# Patient Record
Sex: Female | Born: 1960 | Race: White | Hispanic: No | Smoking: Former smoker
Health system: Southern US, Community
[De-identification: ages and names within clinical notes are randomized; demographics above are authoritative.]

## PROBLEM LIST (undated history)

## (undated) DIAGNOSIS — K5790 Diverticulosis of intestine, part unspecified, without perforation or abscess without bleeding: Secondary | ICD-10-CM

## (undated) DIAGNOSIS — E119 Type 2 diabetes mellitus without complications: Secondary | ICD-10-CM

## (undated) DIAGNOSIS — E785 Hyperlipidemia, unspecified: Secondary | ICD-10-CM

## (undated) DIAGNOSIS — D649 Anemia, unspecified: Secondary | ICD-10-CM

## (undated) DIAGNOSIS — I2699 Other pulmonary embolism without acute cor pulmonale: Secondary | ICD-10-CM

## (undated) DIAGNOSIS — Z973 Presence of spectacles and contact lenses: Secondary | ICD-10-CM

## (undated) DIAGNOSIS — G894 Chronic pain syndrome: Secondary | ICD-10-CM

## (undated) DIAGNOSIS — C801 Malignant (primary) neoplasm, unspecified: Secondary | ICD-10-CM

## (undated) DIAGNOSIS — K219 Gastro-esophageal reflux disease without esophagitis: Secondary | ICD-10-CM

## (undated) DIAGNOSIS — F32A Depression, unspecified: Secondary | ICD-10-CM

## (undated) DIAGNOSIS — F329 Major depressive disorder, single episode, unspecified: Secondary | ICD-10-CM

## (undated) DIAGNOSIS — R87629 Unspecified abnormal cytological findings in specimens from vagina: Secondary | ICD-10-CM

## (undated) DIAGNOSIS — F419 Anxiety disorder, unspecified: Secondary | ICD-10-CM

## (undated) DIAGNOSIS — G5601 Carpal tunnel syndrome, right upper limb: Secondary | ICD-10-CM

## (undated) DIAGNOSIS — K432 Incisional hernia without obstruction or gangrene: Secondary | ICD-10-CM

## (undated) DIAGNOSIS — N189 Chronic kidney disease, unspecified: Secondary | ICD-10-CM

## (undated) DIAGNOSIS — M1990 Unspecified osteoarthritis, unspecified site: Secondary | ICD-10-CM

## (undated) DIAGNOSIS — Z8719 Personal history of other diseases of the digestive system: Secondary | ICD-10-CM

## (undated) DIAGNOSIS — J189 Pneumonia, unspecified organism: Secondary | ICD-10-CM

## (undated) DIAGNOSIS — Z9289 Personal history of other medical treatment: Secondary | ICD-10-CM

## (undated) DIAGNOSIS — C189 Malignant neoplasm of colon, unspecified: Secondary | ICD-10-CM

## (undated) HISTORY — PX: CHOLECYSTECTOMY: SHX55

## (undated) HISTORY — PX: REPLACEMENT TOTAL KNEE: SUR1224

## (undated) HISTORY — PX: IVC FILTER PLACEMENT (ARMC HX): HXRAD1551

## (undated) HISTORY — DX: Unspecified abnormal cytological findings in specimens from vagina: R87.629

## (undated) HISTORY — PX: OTHER SURGICAL HISTORY: SHX169

## (undated) HISTORY — PX: ABDOMINAL HYSTERECTOMY: SHX81

## (undated) HISTORY — PX: DILATION AND CURETTAGE OF UTERUS: SHX78

## (undated) HISTORY — PX: KIDNEY SURGERY: SHX687

## (undated) HISTORY — PX: APPENDECTOMY: SHX54

## (undated) HISTORY — DX: Malignant neoplasm of colon, unspecified: C18.9

## (undated) HISTORY — PX: TUBAL LIGATION: SHX77

---

## 2009-10-05 ENCOUNTER — Emergency Department (HOSPITAL_COMMUNITY): Admission: EM | Admit: 2009-10-05 | Discharge: 2009-10-05 | Payer: Self-pay | Admitting: Emergency Medicine

## 2010-06-12 LAB — DIFFERENTIAL
Basophils Relative: 1 % (ref 0–1)
Eosinophils Absolute: 0.1 10*3/uL (ref 0.0–0.7)
Lymphs Abs: 2.5 10*3/uL (ref 0.7–4.0)
Monocytes Absolute: 0.6 10*3/uL (ref 0.1–1.0)
Monocytes Relative: 7 % (ref 3–12)
Neutrophils Relative %: 60 % (ref 43–77)

## 2010-06-12 LAB — COMPREHENSIVE METABOLIC PANEL
ALT: 15 U/L (ref 0–35)
AST: 19 U/L (ref 0–37)
Albumin: 3.6 g/dL (ref 3.5–5.2)
Alkaline Phosphatase: 61 U/L (ref 39–117)
Calcium: 9.4 mg/dL (ref 8.4–10.5)
GFR calc Af Amer: >60 mL/min (ref >60)
Potassium: 3.8 mEq/L (ref 3.5–5.1)
Sodium: 138 mEq/L (ref 135–145)
Total Protein: 6.8 g/dL (ref 6.0–8.3)

## 2010-06-12 LAB — URINALYSIS, ROUTINE W REFLEX MICROSCOPIC
Glucose, UA: NEGATIVE mg/dL
Nitrite: NEGATIVE
Protein, ur: NEGATIVE mg/dL

## 2010-06-12 LAB — CBC
HCT: 38.6 % (ref 36.0–46.0)
MCHC: 33.9 g/dL (ref 30.0–36.0)
Platelets: 228 10*3/uL (ref 150–400)
RDW: 13.5 % (ref 11.5–15.5)
WBC: 8.2 10*3/uL (ref 4.0–10.5)

## 2010-06-12 LAB — PREGNANCY, URINE: Preg Test, Ur: NEGATIVE

## 2013-06-25 HISTORY — PX: JOINT REPLACEMENT: SHX530

## 2014-10-09 ENCOUNTER — Inpatient Hospital Stay (HOSPITAL_COMMUNITY)
Admission: EM | Admit: 2014-10-09 | Discharge: 2014-10-11 | DRG: 812 | Disposition: A | Discharge status: Home / Self Care | Payer: 59 | Attending: Internal Medicine | Admitting: Internal Medicine

## 2014-10-09 ENCOUNTER — Encounter (HOSPITAL_COMMUNITY): Payer: Self-pay | Admitting: *Deleted

## 2014-10-09 DIAGNOSIS — E119 Type 2 diabetes mellitus without complications: Secondary | ICD-10-CM | POA: Diagnosis present

## 2014-10-09 DIAGNOSIS — E669 Obesity, unspecified: Secondary | ICD-10-CM | POA: Diagnosis present

## 2014-10-09 DIAGNOSIS — D649 Anemia, unspecified: Secondary | ICD-10-CM | POA: Diagnosis not present

## 2014-10-09 DIAGNOSIS — Z87891 Personal history of nicotine dependence: Secondary | ICD-10-CM

## 2014-10-09 DIAGNOSIS — I10 Essential (primary) hypertension: Secondary | ICD-10-CM | POA: Diagnosis present

## 2014-10-09 DIAGNOSIS — R531 Weakness: Secondary | ICD-10-CM | POA: Diagnosis present

## 2014-10-09 DIAGNOSIS — E785 Hyperlipidemia, unspecified: Secondary | ICD-10-CM | POA: Diagnosis present

## 2014-10-09 DIAGNOSIS — Z791 Long term (current) use of non-steroidal anti-inflammatories (NSAID): Secondary | ICD-10-CM | POA: Diagnosis not present

## 2014-10-09 DIAGNOSIS — K219 Gastro-esophageal reflux disease without esophagitis: Secondary | ICD-10-CM | POA: Diagnosis present

## 2014-10-09 DIAGNOSIS — D509 Iron deficiency anemia, unspecified: Secondary | ICD-10-CM | POA: Diagnosis present

## 2014-10-09 HISTORY — DX: Hyperlipidemia, unspecified: E78.5

## 2014-10-09 HISTORY — DX: Unspecified osteoarthritis, unspecified site: M19.90

## 2014-10-09 HISTORY — DX: Type 2 diabetes mellitus without complications: E11.9

## 2014-10-09 LAB — BASIC METABOLIC PANEL
ANION GAP: 12 (ref 5–15)
BUN: 14 mg/dL (ref 6–20)
CHLORIDE: 97 mmol/L — AB (ref 101–111)
CO2: 25 mmol/L (ref 22–32)
Calcium: 9.5 mg/dL (ref 8.9–10.3)
Creatinine, Ser: 1.16 mg/dL — ABNORMAL HIGH (ref 0.44–1.00)
GFR calc Af Amer: >60 mL/min (ref >60)
GFR, EST NON AFRICAN AMERICAN: 52 mL/min — AB (ref >60)
GLUCOSE: 271 mg/dL — AB (ref 65–99)
POTASSIUM: 3.8 mmol/L (ref 3.5–5.1)
Sodium: 134 mmol/L — ABNORMAL LOW (ref 135–145)

## 2014-10-09 LAB — CBG MONITORING, ED: Glucose-Capillary: 209 mg/dL — ABNORMAL HIGH (ref 65–99)

## 2014-10-09 LAB — RETICULOCYTES
RBC.: 3.6 MIL/uL — ABNORMAL LOW (ref 3.87–5.11)
Retic Count, Absolute: 75.6 10*3/uL (ref 19.0–186.0)
Retic Ct Pct: 2.1 % (ref 0.4–3.1)

## 2014-10-09 LAB — CBC WITH DIFFERENTIAL/PLATELET
BASOS ABS: 0 10*3/uL (ref 0.0–0.1)
Basophils Relative: 1 % (ref 0–1)
EOS PCT: 4 % (ref 0–5)
Eosinophils Absolute: 0.3 10*3/uL (ref 0.0–0.7)
HCT: 23.3 % — ABNORMAL LOW (ref 36.0–46.0)
HEMOGLOBIN: 6.7 g/dL — AB (ref 12.0–15.0)
LYMPHS PCT: 22 % (ref 12–46)
Lymphs Abs: 1.6 10*3/uL (ref 0.7–4.0)
MCH: 18.7 pg — AB (ref 26.0–34.0)
MCHC: 28.8 g/dL — AB (ref 30.0–36.0)
MCV: 64.9 fL — AB (ref 78.0–100.0)
MONO ABS: 0.5 10*3/uL (ref 0.1–1.0)
Monocytes Relative: 6 % (ref 3–12)
NEUTROS ABS: 4.8 10*3/uL (ref 1.7–7.7)
NEUTROS PCT: 67 % (ref 43–77)
Platelets: 548 10*3/uL — ABNORMAL HIGH (ref 150–400)
RBC: 3.59 MIL/uL — AB (ref 3.87–5.11)
RDW: 18.2 % — ABNORMAL HIGH (ref 11.5–15.5)
WBC: 7.2 10*3/uL (ref 4.0–10.5)

## 2014-10-09 LAB — ABO/RH: ABO/RH(D): AB POS

## 2014-10-09 LAB — PREPARE RBC (CROSSMATCH)

## 2014-10-09 MED ORDER — SODIUM CHLORIDE 0.9 % IV BOLUS (SEPSIS)
500.0000 mL | Freq: Once | INTRAVENOUS | Status: AC
  Administered 2014-10-10: 500 mL via INTRAVENOUS

## 2014-10-09 MED ORDER — INSULIN ASPART 100 UNIT/ML ~~LOC~~ SOLN
0.0000 [IU] | Freq: Three times a day (TID) | SUBCUTANEOUS | Status: DC
  Administered 2014-10-10 – 2014-10-11 (×5): 4 [IU] via SUBCUTANEOUS

## 2014-10-09 MED ORDER — INSULIN ASPART 100 UNIT/ML ~~LOC~~ SOLN
0.0000 [IU] | Freq: Every day | SUBCUTANEOUS | Status: DC
  Administered 2014-10-10: 2 [IU] via SUBCUTANEOUS

## 2014-10-09 MED ORDER — ONDANSETRON HCL 4 MG PO TABS: 4.0000 mg | ORAL_TABLET | Freq: Four times a day (QID) | ORAL | Status: DC | PRN

## 2014-10-09 MED ORDER — ONDANSETRON HCL 4 MG/2ML IJ SOLN: 4.0000 mg | Freq: Four times a day (QID) | INTRAMUSCULAR | Status: DC | PRN

## 2014-10-09 MED ORDER — SODIUM CHLORIDE 0.9 % IV SOLN
Freq: Once | INTRAVENOUS | Status: AC
  Administered 2014-10-10: 01:00:00 via INTRAVENOUS

## 2014-10-09 NOTE — ED Notes (Signed)
Dr Gearlean Alf ordered to hold Blood administration until results of his pending lab orders.

## 2014-10-09 NOTE — ED Notes (Signed)
CRITICAL VALUE ALERT  Critical value received:  hgb 6.7  Date of notification:  10/09/14  Time of notification:  2105  Critical value read back:Yes.    Nurse who received alert:  [facility]  MD notified (1st page):Gosrani  Time of first page:  2107  MD notified (2nd page):  Time of second page:  Responding MD:  Anastasio Champion  Time MD responded:  2107

## 2014-10-09 NOTE — H&P (Signed)
[facility] History and Physical  [name] NAT:[id number] DOB: [birth year] DOA: 10/09/2014  Referring physician: ER, Dr. Lacinda Axon PCP: Josem Kaufmann, MD   Chief Complaint: Dyspnea. Fatigue.  HPI: Krystal Campbell is a 54 y.o. female  This is a 54 year old lady who gives a two-week history of fatigue, weakness, lightheadedness and dyspnea on exertion for 2 weeks. She denies any chest pain, fever, cough. She has a history of diabetes, hypertension and hyperlipidemia. She denies any rectal bleeding or melenic. There is no hematemesis. She does describe epigastric discomfort and reflux for the last 2 months. Rectal examination that was previously performed today was negative for blood. Evaluation in the emergency room shows it to have significant microcytic anemia with a hemoglobin of 6.7 and MCV of 64.9. She is now being admitted for further investigation and management.   Review of Systems:  Apart from symptoms above, all systems negative.  Past Medical History  Diagnosis Date  . Arthritis   . Diabetes mellitus without complication   . Hypertension   . Hyperlipidemia    Past Surgical History  Procedure Laterality Date  . Replacement total knee    . Abdominal hysterectomy    . Kidney surgery     Social History:  reports that she has quit smoking. She does not have any smokeless tobacco history on file. She reports that she drinks alcohol. Her drug history is not on file.  No Known Allergies   Family history: No family history of diabetes or hypertension.   Prior to Admission medications   Not on File   Physical Exam: Filed Vitals:   10/09/14 1836 10/09/14 2009 10/09/14 2030  BP: 135/78 115/72 115/56  Pulse: 122 68 108  Temp: 98.3 F (36.8 C)    TempSrc: Oral    Resp: 24 18   Height: 5\' 4"  (1.626 m)    Weight: 112.492 kg (248 lb)    SpO2: 100% 99% 99%    Wt Readings from Last 3 Encounters:  10/09/14 112.492 kg (248 lb)    General:  Appears calm and comfortable.  Surprisingly does not appear pale. Eyes: PERRL, normal lids, irises & conjunctiva ENT: grossly normal hearing, lips & tongue Neck: no LAD, masses or thyromegaly Cardiovascular: RRR, no m/r/g. No LE edema. Telemetry: SR, no arrhythmias  Respiratory: CTA bilaterally, no w/r/r. Normal respiratory effort. Abdomen: soft, ntnd Skin: no rash or induration seen on limited exam Musculoskeletal: grossly normal tone BUE/BLE Psychiatric: grossly normal mood and affect, speech fluent and appropriate Neurologic: grossly non-focal.          Labs on Admission:  Basic Metabolic Panel:  Recent Labs Lab 10/09/14 2005  NA 134*  K 3.8  CL 97*  CO2 25  GLUCOSE 271*  BUN 14  CREATININE 1.16*  CALCIUM 9.5   Liver Function Tests: No results for input(s): AST, ALT, ALKPHOS, BILITOT, PROT, ALBUMIN in the last 168 hours. No results for input(s): LIPASE, AMYLASE in the last 168 hours. No results for input(s): AMMONIA in the last 168 hours. CBC:  Recent Labs Lab 10/09/14 2005  WBC 7.2  NEUTROABS 4.8  HGB 6.7*  HCT 23.3*  MCV 64.9*  PLT 548*   Cardiac Enzymes: No results for input(s): CKTOTAL, CKMB, CKMBINDEX, TROPONINI in the last 168 hours.  BNP (last 3 results) No results for input(s): BNP in the last 8760 hours.  ProBNP (last 3 results) No results for input(s): PROBNP in the last 8760 hours.  CBG: No results for input(s): GLUCAP in the last  168 hours.  Radiological Exams on Admission: No results found.   Assessment/Plan   1. Microcytic anemia. She likely has iron deficiency anemia. She will need upper GI endoscopy and probably colonoscopy. Anemia panel will be checked. She will be given 2 units blood transfusion. 2. Diabetes. Sliding scale of insulin. 3. Hypertension. Appears to be stable. 4. Obesity  Further recommendations will depend on patient's hospital progress.   Code Status: Full code.  DVT Prophylaxis: SCDs.  Family Communication: I discussed the plan with  the patient at the bedside  Disposition Plan: Home in medically stable.   Time spent: 60 minutes.  Doree Albee [facility] Pager [phone].

## 2014-10-09 NOTE — ED Notes (Signed)
Pt was sent from [place] x [redacted] weeks along with dizziness. Hgb was 7.8. Copies of all labs, EKGs and disc of Chest xray sent with pt.

## 2014-10-09 NOTE — ED Provider Notes (Signed)
CSN: [id number]     Arrival date & time 10/09/14  1831 History   First MD Initiated Contact with Patient 10/09/14 1848     Chief Complaint  Patient presents with  . Abnormal Lab     (Consider location/radiation/quality/duration/timing/severity/associated sxs/prior Treatment) HPI.... Lightheaded, weakness, fatigue, dyspnea on exertion for 2 weeks. Patient was seen in urgent care center today in [place] and was diagnosed with anemia. No chest pain, fever, cough. Past medical history includes diabetes, hypertension, hyperlipidemia. No black stool. Rectal exam performed today was negative for blood.  Past Medical History  Diagnosis Date  . Arthritis   . Diabetes mellitus without complication   . Hypertension   . Hyperlipidemia    Past Surgical History  Procedure Laterality Date  . Replacement total knee    . Abdominal hysterectomy    . Kidney surgery     No family history on file. History  Substance Use Topics  . Smoking status: Former Research scientist (life sciences)  . Smokeless tobacco: Not on file  . Alcohol Use: Yes     Comment: rarely   OB History    No data available     Review of Systems  All other systems reviewed and are negative.     Allergies  Review of patient's allergies indicates no known allergies.  Home Medications   Prior to Admission medications   Not on File   BP 115/56 mmHg  Pulse 108  Temp(Src) 98.3 F (36.8 C) (Oral)  Resp 18  Ht 5\' 4"  (1.626 m)  Wt 248 lb (112.492 kg)  BMI 42.55 kg/m2  SpO2 99% Physical Exam  Constitutional: She is oriented to person, place, and time. She appears well-developed and well-nourished.  HENT:  Head: Normocephalic and atraumatic.  Eyes: Conjunctivae and EOM are normal. Pupils are equal, round, and reactive to light.  Neck: Normal range of motion. Neck supple.  Cardiovascular: Normal rate and regular rhythm.   Pulmonary/Chest: Effort normal and breath sounds normal.  Abdominal: Soft. Bowel sounds are normal.  Musculoskeletal:  Normal range of motion.  Neurological: She is alert and oriented to person, place, and time.  Skin: Skin is warm and dry.  Psychiatric: She has a normal mood and affect. Her behavior is normal.  Nursing note and vitals reviewed.   ED Course  Procedures (including critical care time) Labs Review Labs Reviewed  BASIC METABOLIC PANEL - Abnormal; Notable for the following:    Sodium 134 (*)    Chloride 97 (*)    Glucose, Bld 271 (*)    Creatinine, Ser 1.16 (*)    GFR calc non Af Amer 52 (*)    All other components within normal limits  CBC WITH DIFFERENTIAL/PLATELET  TYPE AND SCREEN  PREPARE RBC (CROSSMATCH)    Imaging Review No results found.   EKG Interpretation None      MDM   Final diagnoses:  Anemia, unspecified anemia type    New diagnosis of symptomatic anemia. 2 units of PRBC's ordered. Admit to general medicine.    Nat Christen, MD 10/09/14 2059

## 2014-10-10 ENCOUNTER — Encounter (HOSPITAL_COMMUNITY): Payer: Self-pay | Admitting: *Deleted

## 2014-10-10 DIAGNOSIS — E119 Type 2 diabetes mellitus without complications: Secondary | ICD-10-CM

## 2014-10-10 DIAGNOSIS — I10 Essential (primary) hypertension: Secondary | ICD-10-CM

## 2014-10-10 DIAGNOSIS — D509 Iron deficiency anemia, unspecified: Principal | ICD-10-CM

## 2014-10-10 DIAGNOSIS — K219 Gastro-esophageal reflux disease without esophagitis: Secondary | ICD-10-CM

## 2014-10-10 LAB — COMPREHENSIVE METABOLIC PANEL
ALBUMIN: 3.1 g/dL — AB (ref 3.5–5.0)
ALT: 12 U/L — ABNORMAL LOW (ref 14–54)
AST: 20 U/L (ref 15–41)
Alkaline Phosphatase: 70 U/L (ref 38–126)
Anion gap: 6 (ref 5–15)
BILIRUBIN TOTAL: 0.9 mg/dL (ref 0.3–1.2)
BUN: 11 mg/dL (ref 6–20)
CO2: 26 mmol/L (ref 22–32)
CREATININE: 1.02 mg/dL — AB (ref 0.44–1.00)
Calcium: 8.4 mg/dL — ABNORMAL LOW (ref 8.9–10.3)
Chloride: 105 mmol/L (ref 101–111)
GFR calc Af Amer: >60 mL/min (ref >60)
GFR calc non Af Amer: >60 mL/min (ref >60)
Glucose, Bld: 183 mg/dL — ABNORMAL HIGH (ref 65–99)
POTASSIUM: 4.3 mmol/L (ref 3.5–5.1)
Sodium: 137 mmol/L (ref 135–145)
TOTAL PROTEIN: 6.2 g/dL — AB (ref 6.5–8.1)

## 2014-10-10 LAB — FOLATE: Folate: 16.4 ng/mL (ref >5.9)

## 2014-10-10 LAB — GLUCOSE, CAPILLARY
GLUCOSE-CAPILLARY: 175 mg/dL — AB (ref 65–99)
Glucose-Capillary: 162 mg/dL — ABNORMAL HIGH (ref 65–99)
Glucose-Capillary: 175 mg/dL — ABNORMAL HIGH (ref 65–99)
Glucose-Capillary: 244 mg/dL — ABNORMAL HIGH (ref 65–99)

## 2014-10-10 LAB — CBC
HCT: 26.6 % — ABNORMAL LOW (ref 36.0–46.0)
Hemoglobin: 8 g/dL — ABNORMAL LOW (ref 12.0–15.0)
MCH: 21.1 pg — ABNORMAL LOW (ref 26.0–34.0)
MCHC: 30.1 g/dL (ref 30.0–36.0)
MCV: 70.2 fL — ABNORMAL LOW (ref 78.0–100.0)
PLATELETS: 415 10*3/uL — AB (ref 150–400)
RBC: 3.79 MIL/uL — ABNORMAL LOW (ref 3.87–5.11)
RDW: 20.5 % — AB (ref 11.5–15.5)
WBC: 5.2 10*3/uL (ref 4.0–10.5)

## 2014-10-10 LAB — VITAMIN B12: Vitamin B-12: 257 pg/mL (ref 180–914)

## 2014-10-10 LAB — FERRITIN: Ferritin: 6 ng/mL — ABNORMAL LOW (ref 11–307)

## 2014-10-10 MED ORDER — LORAZEPAM 1 MG PO TABS
1.0000 mg | ORAL_TABLET | Freq: Three times a day (TID) | ORAL | Status: DC | PRN
  Administered 2014-10-10 (×2): 1 mg via ORAL
  Filled 2014-10-10 (×2): qty 1

## 2014-10-10 MED ORDER — GABAPENTIN 800 MG PO TABS: 800.0000 mg | ORAL_TABLET | Freq: Three times a day (TID) | ORAL | Status: DC

## 2014-10-10 MED ORDER — PANTOPRAZOLE SODIUM 40 MG PO TBEC
40.0000 mg | DELAYED_RELEASE_TABLET | Freq: Every day | ORAL | Status: DC
  Administered 2014-10-10: 40 mg via ORAL
  Filled 2014-10-10: qty 1

## 2014-10-10 MED ORDER — GABAPENTIN 400 MG PO CAPS
800.0000 mg | ORAL_CAPSULE | Freq: Three times a day (TID) | ORAL | Status: DC
  Administered 2014-10-10 – 2014-10-11 (×5): 800 mg via ORAL
  Filled 2014-10-10 (×5): qty 2

## 2014-10-10 MED ORDER — PANTOPRAZOLE SODIUM 40 MG PO TBEC
40.0000 mg | DELAYED_RELEASE_TABLET | Freq: Two times a day (BID) | ORAL | Status: DC
  Administered 2014-10-10 – 2014-10-11 (×2): 40 mg via ORAL
  Filled 2014-10-10 (×2): qty 1

## 2014-10-10 MED ORDER — GLUCERNA SHAKE PO LIQD
237.0000 mL | Freq: Two times a day (BID) | ORAL | Status: DC
  Administered 2014-10-10 – 2014-10-11 (×2): 237 mL via ORAL

## 2014-10-10 MED ORDER — TRAMADOL HCL 50 MG PO TABS
50.0000 mg | ORAL_TABLET | Freq: Once | ORAL | Status: AC
  Administered 2014-10-10: 50 mg via ORAL
  Filled 2014-10-10: qty 1

## 2014-10-10 MED ORDER — SODIUM CHLORIDE 0.9 % IV SOLN
INTRAVENOUS | Status: DC
  Administered 2014-10-10 – 2014-10-11 (×3): via INTRAVENOUS

## 2014-10-10 NOTE — Progress Notes (Signed)
Utilization review Completed Henryk Ursin RN BSN   

## 2014-10-10 NOTE — Progress Notes (Signed)
Patient still has complaints of bilateral knee pain. Paged on-call MD, will follow any new orders given and continue to monitor the patient.

## 2014-10-10 NOTE — Progress Notes (Signed)
Patient is complaining of chronic knee pain r/t a TKR. Paged the on-call MD, will follow any new orders received and continue to monitor the patient.

## 2014-10-10 NOTE — Progress Notes (Signed)
Initial Nutrition Assessment  DOCUMENTATION CODES:  Morbid obesity  INTERVENTION:  Glucerna Shake po BID, each supplement provides 220 kcal and 10 grams of protein  Pt has had elevated CBGs, recommend checking A1C  Recommend MVI with minerals  NUTRITION DIAGNOSIS:  Altered nutrition lab value (Blood glucose) related to diabetes as evidenced by admit CBGs > 200  GOAL:  Patient will meet greater than or equal to 90% of their needs  MONITOR:  PO intake, Supplement acceptance, Labs, I & O's (Work up)  REASON FOR ASSESSMENT:  Malnutrition Screening Tool    ASSESSMENT:  54 year old lady who gives a two-week history of fatigue, weakness, lightheadedness and dyspnea on exertion for 2 weeks. She has a history of diabetes, hypertension and hyperlipidemia.   No mention of how pt has been eating or weight hx. CBGs elevated  Diet Order:  Diet Carb Modified Fluid consistency:: Thin; Room service appropriate?: Yes  Skin:  Reviewed, no issues  Last BM:  7/15  Height:  Ht Readings from Last 1 Encounters:  10/09/14 5\' 4"  (1.626 m)   Weight:  Wt Readings from Last 1 Encounters:  10/09/14 248 lb (112.492 kg)    Ideal Body Weight:  54.54 kg  Wt Readings from Last 10 Encounters:  10/09/14 248 lb (112.492 kg)   BMI:  Body mass index is 42.55 kg/(m^2).  Estimated Nutritional Needs:  Kcal:  1450-1600 (13-14 kcal/kg) Protein:  65-76 (1.2-1.4 g/kg IBW) Fluid:  1.5-1.6 liters  EDUCATION NEEDS:  Education needs no appropriate at this time  Burtis Junes RD, LDN Nutrition Pager: [phone] 10/10/2014 9:28 AM

## 2014-10-10 NOTE — Progress Notes (Signed)
[facility] PROGRESS NOTE  Makynli Stills ALP:[id number] DOB: [birth year] DOA: 10/09/2014 PCP: Josem Kaufmann, MD  Assessment/Plan: 1. Microcytic anemia. Patient presented to the hospital with fatigue, lightheadedness and shortness of breath. She was found to have hemoglobin of 6.7 with significant macrocytosis. She was transfused 2 units PRBCs with improvement of hemoglobin 8. She continues to have symptoms including dizziness and lightheadedness. She still tachycardic. We'll continue with IV fluids and repeat CBC in the morning. Anemia panel has been drawn, suspect iron deficiency. She will need further workup including EGD and colonoscopy. She does not have any gross evidence of bleeding. Rectal exam was negative for blood. 2. GERD. Patient reports taking daily diclofenac. She's been advised to stop NSAIDs and has been started on proton pump inhibitors. 3. Hypertension. Continue current treatments. 4. Diabetes. Sliding scale insulin.  Code Status: full code Family Communication: discussed with patient Disposition Plan: discharge home once improved   Consultants:    Procedures:    Antibiotics:    HPI/Subjective: Still feeling weak, lightheaded and dizzy  Objective: Filed Vitals:   10/10/14 1346  BP: 148/80  Pulse: 111  Temp: 98.2 F (36.8 C)  Resp: 18    Intake/Output Summary (Last 24 hours) at 10/10/14 1655 Last data filed at 10/10/14 1300  Gross per 24 hour  Intake    845 ml  Output      2 ml  Net    843 ml   Filed Weights   10/09/14 1836  Weight: 112.492 kg (248 lb)    Exam:   General:  NAD  Cardiovascular: S1, S2 tachycardic  Respiratory: cta b  Abdomen: soft nt, nd, bs+  Musculoskeletal: no edema b/l   Data Reviewed: Basic Metabolic Panel:  Recent Labs Lab 10/09/14 2005 10/10/14 1137  NA 134* 137  K 3.8 4.3  CL 97* 105  CO2 25 26  GLUCOSE 271* 183*  BUN 14 11  CREATININE 1.16* 1.02*  CALCIUM 9.5 8.4*   Liver Function  Tests:  Recent Labs Lab 10/10/14 1137  AST 20  ALT 12*  ALKPHOS 70  BILITOT 0.9  PROT 6.2*  ALBUMIN 3.1*   No results for input(s): LIPASE, AMYLASE in the last 168 hours. No results for input(s): AMMONIA in the last 168 hours. CBC:  Recent Labs Lab 10/09/14 2005 10/10/14 1137  WBC 7.2 5.2  NEUTROABS 4.8  --   HGB 6.7* 8.0*  HCT 23.3* 26.6*  MCV 64.9* 70.2*  PLT 548* 415*   Cardiac Enzymes: No results for input(s): CKTOTAL, CKMB, CKMBINDEX, TROPONINI in the last 168 hours. BNP (last 3 results) No results for input(s): BNP in the last 8760 hours.  ProBNP (last 3 results) No results for input(s): PROBNP in the last 8760 hours.  CBG:  Recent Labs Lab 10/09/14 2148 10/10/14 0001 10/10/14 0742 10/10/14 1132  GLUCAP 209* 244* 162* 175*    No results found for this or any previous visit (from the past 240 hour(s)).   Studies: No results found.  Scheduled Meds: . feeding supplement (GLUCERNA SHAKE)  237 mL Oral BID BM  . gabapentin  800 mg Oral TID  . insulin aspart  0-20 Units Subcutaneous TID WC  . insulin aspart  0-5 Units Subcutaneous QHS  . pantoprazole  40 mg Oral Daily   Continuous Infusions: . sodium chloride 125 mL/hr at 10/10/14 1623    Active Problems:   Microcytic anemia   Diabetes   Hypertension    Time spent: 79mins    Scottlyn Mchaney  [facility] Pager [phone]. If 7PM-7AM, please contact night-coverage at [email or web address], password [facility] 10/10/2014, 4:55 PM  LOS: 1 day

## 2014-10-11 DIAGNOSIS — D649 Anemia, unspecified: Secondary | ICD-10-CM | POA: Insufficient documentation

## 2014-10-11 LAB — CBC
HCT: 27.2 % — ABNORMAL LOW (ref 36.0–46.0)
HEMOGLOBIN: 8.1 g/dL — AB (ref 12.0–15.0)
MCH: 21.1 pg — AB (ref 26.0–34.0)
MCHC: 29.8 g/dL — AB (ref 30.0–36.0)
MCV: 71 fL — ABNORMAL LOW (ref 78.0–100.0)
PLATELETS: 464 10*3/uL — AB (ref 150–400)
RBC: 3.83 MIL/uL — ABNORMAL LOW (ref 3.87–5.11)
RDW: 20.6 % — ABNORMAL HIGH (ref 11.5–15.5)
WBC: 6.1 10*3/uL (ref 4.0–10.5)

## 2014-10-11 LAB — GLUCOSE, CAPILLARY
GLUCOSE-CAPILLARY: 195 mg/dL — AB (ref 65–99)
GLUCOSE-CAPILLARY: 180 mg/dL — AB (ref 65–99)
Glucose-Capillary: 159 mg/dL — ABNORMAL HIGH (ref 65–99)

## 2014-10-11 MED ORDER — FERROUS SULFATE 325 (65 FE) MG PO TABS: 325.0000 mg | ORAL_TABLET | Freq: Two times a day (BID) | ORAL | Status: DC

## 2014-10-11 MED ORDER — SODIUM CHLORIDE 0.9 % IV SOLN
510.0000 mg | Freq: Once | INTRAVENOUS | Status: AC
  Administered 2014-10-11: 510 mg via INTRAVENOUS
  Filled 2014-10-11: qty 17

## 2014-10-11 MED ORDER — PANTOPRAZOLE SODIUM 40 MG PO TBEC: 40.0000 mg | DELAYED_RELEASE_TABLET | Freq: Two times a day (BID) | ORAL | Status: DC

## 2014-10-11 NOTE — Progress Notes (Signed)
Patient states understanding of discharge instructions, prescriptions given 

## 2014-10-11 NOTE — Discharge Summary (Signed)
Physician Discharge Summary  Krystal Campbell KYH:[id number] DOB: January 01, 1961 DOA: 10/09/2014  PCP: Josem Kaufmann, MD  Admit date: 10/09/2014 Discharge date: 10/11/2014  Time spent: 40 minutes  Recommendations for Outpatient Follow-up:  1. Patient will need outpatient referral to a gastroenterologist for endoscopy and colonoscopy. Further workup of iron deficiency anemia per primary care physician  Discharge Diagnoses:  Active Problems:   Microcytic anemia   Diabetes   Hypertension   GERD (gastroesophageal reflux disease)   Iron deficiency anemia   Discharge Condition: Improved  Diet recommendation: Low-salt, low carb  Filed Weights   10/09/14 1836  Weight: 112.492 kg (248 lb)    History of present illness:  This patient presents to the hospital with fatigue, weakness, lightheadedness and dyspnea on exertion for 2 weeks prior to admission. She denied any chest pain, fever or cough. She denied any rectal bleeding or melanotic. She was evaluated in the emergency room and found to have a hemoglobin of 6.7. She was admitted with the treatments.  Hospital Course:  Patient was found to have a microcytic anemia related to iron deficiency. She received 2 units of PRBCs and infusion of iron. She denied any history of melanoma or hematochezia. No hematuria. She reported to be menopausal. She had a rectal exam done the emergency room that was Hemoccult-negative. She did describe a history of acid reflux and epigastric discomfort. Review of medications revealed that she was taking daily NSAIDs. She was advised to discontinue further NSAIDs and was started on Protonix twice a day . After transfusion of PRBCs, the patient did clinically improve. She is no longer tachycardic or hypotensive. The patient will need further workup of iron deficiency anemia. Since this is not an acute process and likely has been occurring for some time, this can likely be further worked up in the outpatient setting. She will need  referral to gastroenterologist for endoscopy and colonoscopy. Further workup of iron deficiency anemia per primary care physician. She's been prescribed Protonix and iron supplements. She is otherwise stable for discharge.  Procedures:    Consultations:    Discharge Exam: Filed Vitals:   10/11/14 0500  BP: 123/66  Pulse: 97  Temp: 98.4 F (36.9 C)  Resp: 20    General: No acute distress Cardiovascular: S1, S2, regular rhythm Respiratory: Clear to auscultation bilaterally  Discharge Instructions   Discharge Instructions    Diet - low sodium heart healthy    Complete by:  As directed      Diet Carb Modified    Complete by:  As directed      Increase activity slowly    Complete by:  As directed           Discharge Medication List as of 10/11/2014 12:35 PM    START taking these medications   Details  ferrous sulfate 325 (65 FE) MG tablet Take 1 tablet (325 mg total) by mouth 2 (two) times daily with a meal., Starting 10/11/2014, Until Discontinued, Print    pantoprazole (PROTONIX) 40 MG tablet Take 1 tablet (40 mg total) by mouth 2 (two) times daily., Starting 10/11/2014, Until Discontinued, Print      CONTINUE these medications which have NOT CHANGED   Details  DULoxetine (CYMBALTA) 60 MG capsule Take 60 mg by mouth at bedtime., Until Discontinued, Historical Med    gabapentin (NEURONTIN) 800 MG tablet Take 800 mg by mouth 3 (three) times daily., Until Discontinued, Historical Med    lisinopril (PRINIVIL,ZESTRIL) 20 MG tablet Take 20 mg by mouth  daily., Until Discontinued, Historical Med    LORazepam (ATIVAN) 1 MG tablet Take 1 mg by mouth 3 (three) times daily as needed for anxiety., Until Discontinued, Historical Med    Melatonin 3 MG CAPS Take 3 mg by mouth at bedtime., Until Discontinued, Historical Med    metFORMIN (GLUCOPHAGE) 1000 MG tablet Take 1,000 mg by mouth 2 (two) times daily., Until Discontinued, Historical Med    simvastatin (ZOCOR) 20 MG tablet  Take 20 mg by mouth daily., Until Discontinued, Historical Med      STOP taking these medications     aspirin-sod bicarb-citric acid (ALKA-SELTZER) 325 MG TBEF tablet      diclofenac (VOLTAREN) 75 MG EC tablet        No Known Allergies Follow-up Information    Follow up with SETTLE,PAUL C, MD. Schedule an appointment as soon as possible for a visit in 1 week.   Specialty:  Family Medicine   Why:  talk to your doctor about referral to gastroenterologist for colonoscopy and endoscopy   Contact information:   [address] [place] [address] [phone]        The results of significant diagnostics from this hospitalization (including imaging, microbiology, ancillary and laboratory) are listed below for reference.    Significant Diagnostic Studies: No results found.  Microbiology: No results found for this or any previous visit (from the past 240 hour(s)).   Labs: Basic Metabolic Panel:  Recent Labs Lab 10/09/14 2005 10/10/14 1137  NA 134* 137  K 3.8 4.3  CL 97* 105  CO2 25 26  GLUCOSE 271* 183*  BUN 14 11  CREATININE 1.16* 1.02*  CALCIUM 9.5 8.4*   Liver Function Tests:  Recent Labs Lab 10/10/14 1137  AST 20  ALT 12*  ALKPHOS 70  BILITOT 0.9  PROT 6.2*  ALBUMIN 3.1*   No results for input(s): LIPASE, AMYLASE in the last 168 hours. No results for input(s): AMMONIA in the last 168 hours. CBC:  Recent Labs Lab 10/09/14 2005 10/10/14 1137 10/11/14 0619  WBC 7.2 5.2 6.1  NEUTROABS 4.8  --   --   HGB 6.7* 8.0* 8.1*  HCT 23.3* 26.6* 27.2*  MCV 64.9* 70.2* 71.0*  PLT 548* 415* 464*   Cardiac Enzymes: No results for input(s): CKTOTAL, CKMB, CKMBINDEX, TROPONINI in the last 168 hours. BNP: BNP (last 3 results) No results for input(s): BNP in the last 8760 hours.  ProBNP (last 3 results) No results for input(s): PROBNP in the last 8760 hours.  CBG:  Recent Labs Lab 10/10/14 1132 10/10/14 1701 10/10/14 2203 10/11/14 0745 10/11/14 1135   GLUCAP 175* 175* 195* 159* 180*       Signed:  Ginger Leeth  [facility] 10/11/2014, 6:17 PM

## 2014-10-12 ENCOUNTER — Telehealth: Payer: Self-pay | Admitting: Gastroenterology

## 2014-10-12 LAB — TYPE AND SCREEN
ABO/RH(D): AB POS
ANTIBODY SCREEN: NEGATIVE
UNIT DIVISION: 0
Unit division: 0

## 2014-10-12 LAB — IRON AND TIBC
IRON: 16 ug/dL — AB (ref 28–170)
SATURATION RATIOS: 3 % — AB (ref 10.4–31.8)
TIBC: 528 ug/dL — ABNORMAL HIGH (ref 250–450)
UIBC: 512 ug/dL

## 2014-10-12 NOTE — Telephone Encounter (Signed)
Patient recently hospitalized with microcytic anemia, GI consult requested but there was no coverage, and patient was discharged before Monday.    Please arrange outpatient follow-up with our practice. I'm not sure if she will need a PCP referral or not?   Orvil Feil, ANP-BC [facility]

## 2014-10-12 NOTE — Progress Notes (Signed)
Appears consult was placed over weekend; however, no GI coverage this weekend.   Will arrange routine follow-up with our practice.   Appreciate consultation request.   Orvil Feil, ANP-BC [facility]

## 2014-10-13 NOTE — Telephone Encounter (Signed)
Patient has [facility] Compass and will need referral and compass referral from her PCP prior to scheduling OV. I will call the patient this morning to let her know.

## 2014-10-13 NOTE — Telephone Encounter (Signed)
Thanks

## 2014-10-15 ENCOUNTER — Telehealth: Payer: Self-pay | Admitting: Internal Medicine

## 2014-10-15 NOTE — Telephone Encounter (Signed)
Krystal Campbell at [facility] in [place] (Dr Ivory Broad office) called to make patient an appointment, but we needed a compass referral due to her insurance. [facility] isn't on her [facility] compass card as here pcp and can not send a compass referral. Left message with patient that we will need to cancel her OV for 8/17 at 230 with AS and to call our office. Patient will need compass referral from Dr Catha Brow which is on her card. Pt can be reached at [phone]

## 2014-10-15 NOTE — Telephone Encounter (Signed)
SPOKE TO PATIENT AND SHE WILL CONTACT HER INSURANCE AND CHANGE HER PCP SO THEY CAN GET A REFERRAL AND THEY WILL THEN SCHEDULE HER APPOINTMENT

## 2014-10-16 NOTE — Telephone Encounter (Signed)
Patient is aware that we had to cancel her OV because we need a [facility] compass referral from her PCP and Dr Darlys Gales is on her card. Pt had seen Dr Tawny Asal but they can't send [place] a compass referral because they are not on her Insurance card. Erline Levine spoke with patient and she is aware.

## 2014-10-25 ENCOUNTER — Inpatient Hospital Stay (HOSPITAL_COMMUNITY)
Admission: AD | Admit: 2014-10-25 | Discharge: 2014-11-10 | DRG: 167 | Disposition: A | Discharge status: Home / Self Care | Payer: 59 | Source: Other Acute Inpatient Hospital | Attending: Internal Medicine | Admitting: Internal Medicine

## 2014-10-25 ENCOUNTER — Inpatient Hospital Stay (HOSPITAL_COMMUNITY): Payer: 59

## 2014-10-25 ENCOUNTER — Encounter (HOSPITAL_COMMUNITY): Payer: Self-pay | Admitting: Internal Medicine

## 2014-10-25 DIAGNOSIS — K913 Postprocedural intestinal obstruction: Secondary | ICD-10-CM | POA: Diagnosis not present

## 2014-10-25 DIAGNOSIS — Z96651 Presence of right artificial knee joint: Secondary | ICD-10-CM | POA: Diagnosis present

## 2014-10-25 DIAGNOSIS — C189 Malignant neoplasm of colon, unspecified: Secondary | ICD-10-CM | POA: Diagnosis not present

## 2014-10-25 DIAGNOSIS — E119 Type 2 diabetes mellitus without complications: Secondary | ICD-10-CM

## 2014-10-25 DIAGNOSIS — D62 Acute posthemorrhagic anemia: Secondary | ICD-10-CM | POA: Diagnosis not present

## 2014-10-25 DIAGNOSIS — C801 Malignant (primary) neoplasm, unspecified: Secondary | ICD-10-CM | POA: Diagnosis not present

## 2014-10-25 DIAGNOSIS — D630 Anemia in neoplastic disease: Secondary | ICD-10-CM | POA: Diagnosis present

## 2014-10-25 DIAGNOSIS — D509 Iron deficiency anemia, unspecified: Secondary | ICD-10-CM | POA: Diagnosis present

## 2014-10-25 DIAGNOSIS — R0600 Dyspnea, unspecified: Secondary | ICD-10-CM | POA: Diagnosis not present

## 2014-10-25 DIAGNOSIS — E785 Hyperlipidemia, unspecified: Secondary | ICD-10-CM | POA: Diagnosis present

## 2014-10-25 DIAGNOSIS — D6859 Other primary thrombophilia: Secondary | ICD-10-CM | POA: Diagnosis present

## 2014-10-25 DIAGNOSIS — Z23 Encounter for immunization: Secondary | ICD-10-CM | POA: Diagnosis not present

## 2014-10-25 DIAGNOSIS — K5790 Diverticulosis of intestine, part unspecified, without perforation or abscess without bleeding: Secondary | ICD-10-CM | POA: Diagnosis present

## 2014-10-25 DIAGNOSIS — I2699 Other pulmonary embolism without acute cor pulmonale: Secondary | ICD-10-CM | POA: Diagnosis present

## 2014-10-25 DIAGNOSIS — I10 Essential (primary) hypertension: Secondary | ICD-10-CM | POA: Diagnosis present

## 2014-10-25 DIAGNOSIS — C180 Malignant neoplasm of cecum: Secondary | ICD-10-CM | POA: Diagnosis present

## 2014-10-25 DIAGNOSIS — I82401 Acute embolism and thrombosis of unspecified deep veins of right lower extremity: Secondary | ICD-10-CM | POA: Diagnosis present

## 2014-10-25 DIAGNOSIS — Z87891 Personal history of nicotine dependence: Secondary | ICD-10-CM | POA: Diagnosis not present

## 2014-10-25 DIAGNOSIS — K219 Gastro-esophageal reflux disease without esophagitis: Secondary | ICD-10-CM | POA: Diagnosis present

## 2014-10-25 DIAGNOSIS — K9189 Other postprocedural complications and disorders of digestive system: Secondary | ICD-10-CM

## 2014-10-25 DIAGNOSIS — C779 Secondary and unspecified malignant neoplasm of lymph node, unspecified: Secondary | ICD-10-CM | POA: Diagnosis present

## 2014-10-25 DIAGNOSIS — Z6841 Body Mass Index (BMI) 40.0 and over, adult: Secondary | ICD-10-CM

## 2014-10-25 DIAGNOSIS — K449 Diaphragmatic hernia without obstruction or gangrene: Secondary | ICD-10-CM | POA: Diagnosis present

## 2014-10-25 DIAGNOSIS — K639 Disease of intestine, unspecified: Secondary | ICD-10-CM | POA: Diagnosis not present

## 2014-10-25 DIAGNOSIS — R109 Unspecified abdominal pain: Secondary | ICD-10-CM

## 2014-10-25 DIAGNOSIS — M1990 Unspecified osteoarthritis, unspecified site: Secondary | ICD-10-CM | POA: Diagnosis present

## 2014-10-25 DIAGNOSIS — F411 Generalized anxiety disorder: Secondary | ICD-10-CM | POA: Diagnosis not present

## 2014-10-25 DIAGNOSIS — R0602 Shortness of breath: Secondary | ICD-10-CM

## 2014-10-25 DIAGNOSIS — E669 Obesity, unspecified: Secondary | ICD-10-CM | POA: Diagnosis present

## 2014-10-25 DIAGNOSIS — E46 Unspecified protein-calorie malnutrition: Secondary | ICD-10-CM | POA: Diagnosis present

## 2014-10-25 DIAGNOSIS — I82812 Embolism and thrombosis of superficial veins of left lower extremities: Secondary | ICD-10-CM | POA: Diagnosis not present

## 2014-10-25 DIAGNOSIS — K567 Ileus, unspecified: Secondary | ICD-10-CM

## 2014-10-25 LAB — TYPE AND SCREEN
ABO/RH(D): AB POS
Antibody Screen: NEGATIVE

## 2014-10-25 LAB — COMPREHENSIVE METABOLIC PANEL
ALT: 17 U/L (ref 14–54)
AST: 28 U/L (ref 15–41)
Albumin: 3.1 g/dL — ABNORMAL LOW (ref 3.5–5.0)
Alkaline Phosphatase: 79 U/L (ref 38–126)
Anion gap: 16 — ABNORMAL HIGH (ref 5–15)
BUN: 11 mg/dL (ref 6–20)
CO2: 18 mmol/L — ABNORMAL LOW (ref 22–32)
Calcium: 8.9 mg/dL (ref 8.9–10.3)
Chloride: 99 mmol/L — ABNORMAL LOW (ref 101–111)
Creatinine, Ser: 1.2 mg/dL — ABNORMAL HIGH (ref 0.44–1.00)
GFR, EST AFRICAN AMERICAN: 58 mL/min — AB (ref >60)
GFR, EST NON AFRICAN AMERICAN: 50 mL/min — AB (ref >60)
Glucose, Bld: 358 mg/dL — ABNORMAL HIGH (ref 65–99)
POTASSIUM: 4.5 mmol/L (ref 3.5–5.1)
Sodium: 133 mmol/L — ABNORMAL LOW (ref 135–145)
Total Bilirubin: 0.6 mg/dL (ref 0.3–1.2)
Total Protein: 6.9 g/dL (ref 6.5–8.1)

## 2014-10-25 LAB — CBC
HCT: 30 % — ABNORMAL LOW (ref 36.0–46.0)
Hemoglobin: 9 g/dL — ABNORMAL LOW (ref 12.0–15.0)
MCH: 23.1 pg — AB (ref 26.0–34.0)
MCHC: 30 g/dL (ref 30.0–36.0)
MCV: 77.1 fL — AB (ref 78.0–100.0)
PLATELETS: 346 10*3/uL (ref 150–400)
RBC: 3.89 MIL/uL (ref 3.87–5.11)
RDW: 29.8 % — AB (ref 11.5–15.5)
WBC: 9.9 10*3/uL (ref 4.0–10.5)

## 2014-10-25 LAB — GLUCOSE, CAPILLARY
GLUCOSE-CAPILLARY: 351 mg/dL — AB (ref 65–99)
GLUCOSE-CAPILLARY: 358 mg/dL — AB (ref 65–99)
Glucose-Capillary: 274 mg/dL — ABNORMAL HIGH (ref 65–99)

## 2014-10-25 LAB — ABO/RH: ABO/RH(D): AB POS

## 2014-10-25 LAB — MRSA PCR SCREENING: MRSA BY PCR: NEGATIVE

## 2014-10-25 LAB — TSH: TSH: 1.457 u[IU]/mL (ref 0.350–4.500)

## 2014-10-25 LAB — TROPONIN I

## 2014-10-25 MED ORDER — LORAZEPAM 1 MG PO TABS
1.0000 mg | ORAL_TABLET | Freq: Three times a day (TID) | ORAL | Status: DC | PRN
  Administered 2014-10-25 – 2014-11-06 (×4): 1 mg via ORAL
  Filled 2014-10-25 (×4): qty 1

## 2014-10-25 MED ORDER — CETYLPYRIDINIUM CHLORIDE 0.05 % MT LIQD
7.0000 mL | Freq: Two times a day (BID) | OROMUCOSAL | Status: DC
  Administered 2014-10-25 – 2014-11-09 (×23): 7 mL via OROMUCOSAL

## 2014-10-25 MED ORDER — SODIUM CHLORIDE 0.9 % IJ SOLN
3.0000 mL | Freq: Two times a day (BID) | INTRAMUSCULAR | Status: DC
  Administered 2014-10-27 – 2014-11-10 (×15): 3 mL via INTRAVENOUS

## 2014-10-25 MED ORDER — LISINOPRIL 20 MG PO TABS: 20.0000 mg | ORAL_TABLET | Freq: Every day | ORAL | Status: DC

## 2014-10-25 MED ORDER — ACETAMINOPHEN 650 MG RE SUPP: 650.0000 mg | Freq: Four times a day (QID) | RECTAL | Status: DC | PRN

## 2014-10-25 MED ORDER — GABAPENTIN 800 MG PO TABS
800.0000 mg | ORAL_TABLET | Freq: Three times a day (TID) | ORAL | Status: DC
  Filled 2014-10-25: qty 1

## 2014-10-25 MED ORDER — INSULIN ASPART 100 UNIT/ML ~~LOC~~ SOLN: 0.0000 [IU] | Freq: Three times a day (TID) | SUBCUTANEOUS | Status: DC

## 2014-10-25 MED ORDER — INSULIN ASPART 100 UNIT/ML ~~LOC~~ SOLN
0.0000 [IU] | Freq: Three times a day (TID) | SUBCUTANEOUS | Status: DC
  Administered 2014-10-25: 15 [IU] via SUBCUTANEOUS

## 2014-10-25 MED ORDER — SIMVASTATIN 20 MG PO TABS
20.0000 mg | ORAL_TABLET | Freq: Every day | ORAL | Status: DC
  Administered 2014-10-25 – 2014-11-09 (×15): 20 mg via ORAL
  Filled 2014-10-25 (×15): qty 1

## 2014-10-25 MED ORDER — INSULIN ASPART 100 UNIT/ML ~~LOC~~ SOLN
0.0000 [IU] | Freq: Every day | SUBCUTANEOUS | Status: DC
  Administered 2014-10-25: 3 [IU] via SUBCUTANEOUS

## 2014-10-25 MED ORDER — ACETAMINOPHEN 325 MG PO TABS
650.0000 mg | ORAL_TABLET | Freq: Four times a day (QID) | ORAL | Status: DC | PRN
  Administered 2014-10-25 – 2014-11-06 (×2): 650 mg via ORAL
  Filled 2014-10-25 (×2): qty 2

## 2014-10-25 MED ORDER — GABAPENTIN 400 MG PO CAPS
800.0000 mg | ORAL_CAPSULE | Freq: Three times a day (TID) | ORAL | Status: DC
  Administered 2014-10-25 – 2014-11-10 (×43): 800 mg via ORAL
  Filled 2014-10-25 (×19): qty 2
  Filled 2014-10-25: qty 8
  Filled 2014-10-25 (×12): qty 2
  Filled 2014-10-25: qty 8
  Filled 2014-10-25: qty 2
  Filled 2014-10-25: qty 8
  Filled 2014-10-25 (×9): qty 2

## 2014-10-25 MED ORDER — ONDANSETRON HCL 4 MG/2ML IJ SOLN
4.0000 mg | Freq: Four times a day (QID) | INTRAMUSCULAR | Status: DC | PRN
  Administered 2014-11-02 – 2014-11-03 (×2): 4 mg via INTRAVENOUS
  Filled 2014-10-25 (×2): qty 2

## 2014-10-25 MED ORDER — ONDANSETRON HCL 4 MG PO TABS
4.0000 mg | ORAL_TABLET | Freq: Four times a day (QID) | ORAL | Status: DC | PRN
  Administered 2014-11-08 – 2014-11-09 (×2): 4 mg via ORAL
  Filled 2014-10-25 (×2): qty 1

## 2014-10-25 MED ORDER — PANTOPRAZOLE SODIUM 40 MG PO TBEC
40.0000 mg | DELAYED_RELEASE_TABLET | Freq: Two times a day (BID) | ORAL | Status: DC
  Administered 2014-10-25 – 2014-11-07 (×23): 40 mg via ORAL
  Filled 2014-10-25 (×25): qty 1

## 2014-10-25 MED ORDER — HEPARIN (PORCINE) IN NACL 100-0.45 UNIT/ML-% IJ SOLN
1600.0000 [IU]/h | INTRAMUSCULAR | Status: DC
  Administered 2014-10-26 – 2014-10-27 (×2): 1300 [IU]/h via INTRAVENOUS
  Administered 2014-10-29 (×2): 1600 [IU]/h via INTRAVENOUS
  Filled 2014-10-25 (×8): qty 250

## 2014-10-25 MED ORDER — DULOXETINE HCL 60 MG PO CPEP: 60.0000 mg | ORAL_CAPSULE | Freq: Every day | ORAL | Status: DC

## 2014-10-25 NOTE — H&P (Signed)
[facility] History and Physical  Quintasia Theroux GYI:[id number] DOB: 1961-02-22 DOA: 10/25/2014  Referring physician: er PCP: Josem Kaufmann, MD   Chief Complaint: SOB  HPI: Krystal Campbell is a 54 y.o. female  With PMhx of HTN, DM, obesity.   Sent to [facility] around July 15th by her PCP after Hgb was found to be low. She has fatigue, SOB, and pica.   PCP wanted a GI evaluation (UGD and colonoscopy).  Patient was transfused and sent for an outpatient GI.  She continued to feel poorly.  Her daughter was in a car accident and patient drove 10 hours with a friend to check on her.  She had a cough during that time and SOB.  When patient returned, she saw her PCP who gave her azithromycin with no improvement.   Today, she presented to [facility] with continued SOB, fatigue.   At [facility], she was found to have a PE on CTA with acute heart strain.  She also received a CT Scan of abd as her hgb was low at [facility] and was found to have cecal wall thickening with right mesenteric lymph node-- recommended direct inspection.     She was given dose of lovenox at [facility] and sent to [facility]   Review of Systems:  All systems reviewed, negative unless stated above    Past Medical History  Diagnosis Date  . Arthritis   . Diabetes mellitus without complication   . Hypertension   . Hyperlipidemia    Past Surgical History  Procedure Laterality Date  . Replacement total knee    . Abdominal hysterectomy    . Kidney surgery     Social History:  reports that she has quit smoking. She does not have any smokeless tobacco history on file. She reports that she drinks alcohol. Her drug history is not on file.  No Known Allergies  Family History  Problem Relation Age of Onset  . Healthy Mother     no clotting issues in either parents  . Healthy Father     Prior to Admission medications   Medication Sig Start Date End Date Taking? Authorizing Provider  DULoxetine (CYMBALTA) 60 MG capsule Take 60 mg by  mouth at bedtime.    Historical Provider, MD  ferrous sulfate 325 (65 FE) MG tablet Take 1 tablet (325 mg total) by mouth 2 (two) times daily with a meal. 10/11/14   Kathie Dike, MD  gabapentin (NEURONTIN) 800 MG tablet Take 800 mg by mouth 3 (three) times daily.    Historical Provider, MD  lisinopril (PRINIVIL,ZESTRIL) 20 MG tablet Take 20 mg by mouth daily.    Historical Provider, MD  LORazepam (ATIVAN) 1 MG tablet Take 1 mg by mouth 3 (three) times daily as needed for anxiety.    Historical Provider, MD  Melatonin 3 MG CAPS Take 3 mg by mouth at bedtime.    Historical Provider, MD  metFORMIN (GLUCOPHAGE) 1000 MG tablet Take 1,000 mg by mouth 2 (two) times daily.    Historical Provider, MD  pantoprazole (PROTONIX) 40 MG tablet Take 1 tablet (40 mg total) by mouth 2 (two) times daily. 10/11/14   Kathie Dike, MD  simvastatin (ZOCOR) 20 MG tablet Take 20 mg by mouth daily.    Historical Provider, MD   Physical Exam: Filed Vitals:   10/25/14 1704 10/25/14 1712  BP: 142/66   Pulse: 112   Temp: 99.1 F (37.3 C)   TempSrc: Oral   Resp: 23   Height: 5'  4" (1.626 m)   Weight: 116.937 kg (257 lb 12.8 oz)   SpO2: 99% 99%    Wt Readings from Last 3 Encounters:  10/25/14 116.937 kg (257 lb 12.8 oz)  10/09/14 112.492 kg (248 lb)    General:  Increased work of breathing with talking, A+Ox3 Eyes: PERRL, normal lids, irises & conjunctiva ENT: grossly normal hearing, lips & tongue Neck: no LAD, masses or thyromegaly Cardiovascular: RRR, no m/r/g. No LE edema. Telemetry: sinus tachy  Respiratory: CTA bilaterally, no w/r/r. Increased work of breathing Abdomen: soft, ntnd, obese Skin: bruising on left arm Musculoskeletal: grossly normal tone BUE/BLE Psychiatric: grossly normal mood and affect, speech fluent and appropriate Neurologic: grossly non-focal.          Labs on Admission:  Basic Metabolic Panel: No results for input(s): NA, K, CL, CO2, GLUCOSE, BUN, CREATININE, CALCIUM, MG,  PHOS in the last 168 hours. Liver Function Tests: No results for input(s): AST, ALT, ALKPHOS, BILITOT, PROT, ALBUMIN in the last 168 hours. No results for input(s): LIPASE, AMYLASE in the last 168 hours. No results for input(s): AMMONIA in the last 168 hours. CBC: No results for input(s): WBC, NEUTROABS, HGB, HCT, MCV, PLT in the last 168 hours. Cardiac Enzymes: No results for input(s): CKTOTAL, CKMB, CKMBINDEX, TROPONINI in the last 168 hours.  BNP (last 3 results) No results for input(s): BNP in the last 8760 hours.  ProBNP (last 3 results) No results for input(s): PROBNP in the last 8760 hours.  CBG: No results for input(s): GLUCAP in the last 168 hours.  Radiological Exams on Admission: No results found.   Assessment/Plan Active Problems:   Diabetes   Hypertension   GERD (gastroesophageal reflux disease)   Pulmonary emboli   Cecal lesion   Anxiety state  pulm emboli -given lovenox dose at [facility] -started on heparin gtt here -check 2 D echo -check duplex LE  DM -hold metformin -SSI  Cecal wall thickening -will need GI consult once pulm status is stable -recent transfusions at [facility]  Microcytic anemia -recent stay at [facility] -transfused 2 units- PCP wanted EGD and colonoscopy -check CBC  Increased BNP -x ray  -echo  Tachycardia -due to PE   Will need consult for GI in 1-2 days once stable from pulm standpoint   Code Status: full DVT Prophylaxis: Family Communication: patient Disposition Plan: SDU  Time spent: 75 min  Eulogio Bear [facility] Pager [phone]

## 2014-10-25 NOTE — Progress Notes (Signed)
ANTICOAGULATION CONSULT NOTE - Initial Consult  Pharmacy Consult for heparin Indication: pulmonary embolus  No Known Allergies  Patient Measurements: Height: 5\' 4"  (162.6 cm) Weight: 257 lb 12.8 oz (116.937 kg) IBW/kg (Calculated) : 54.7 Heparin Dosing Weight: 82.9kg  Vital Signs: Temp: 99.1 F (37.3 C) (07/31 1704) Temp Source: Oral (07/31 1704) BP: 142/66 mmHg (07/31 1704) Pulse Rate: 112 (07/31 1704)  Labs: No results for input(s): HGB, HCT, PLT, APTT, LABPROT, INR, HEPARINUNFRC, CREATININE, CKTOTAL, CKMB, TROPONINI in the last 72 hours.  Estimated Creatinine Clearance: 79.2 mL/min (by C-G formula based on Cr of 1.02).   Medical History: Past Medical History  Diagnosis Date  . Arthritis   . Diabetes mellitus without complication   . Hypertension   . Hyperlipidemia    Assessment: 7 yof presented to an outside hospital with SOB. Found to have a PE. She received a dose of lovenox 100mg  at ~1230pm today. Labs are pending and will be reviewed prior to heparin starting.    Goal of Therapy:  Heparin level 0.3-0.7 units/ml Monitor platelets by anticoagulation protocol: Yes   Plan:  - Start heparin gtt at 1300 units/hr tomorrow at [place] an 8 hour heparin level - Daily heparin level and CBC - F/u plans for oral anticoagulation  Renwick Asman, Rande Lawman 10/25/2014,5:59 PM

## 2014-10-26 ENCOUNTER — Encounter (HOSPITAL_COMMUNITY): Payer: Self-pay

## 2014-10-26 ENCOUNTER — Ambulatory Visit (HOSPITAL_COMMUNITY): Payer: 59

## 2014-10-26 DIAGNOSIS — E119 Type 2 diabetes mellitus without complications: Secondary | ICD-10-CM

## 2014-10-26 DIAGNOSIS — I2699 Other pulmonary embolism without acute cor pulmonale: Secondary | ICD-10-CM | POA: Diagnosis present

## 2014-10-26 DIAGNOSIS — D509 Iron deficiency anemia, unspecified: Secondary | ICD-10-CM | POA: Diagnosis present

## 2014-10-26 DIAGNOSIS — K219 Gastro-esophageal reflux disease without esophagitis: Secondary | ICD-10-CM

## 2014-10-26 LAB — BASIC METABOLIC PANEL
ANION GAP: 5 (ref 5–15)
BUN: 10 mg/dL (ref 6–20)
CHLORIDE: 108 mmol/L (ref 101–111)
CO2: 24 mmol/L (ref 22–32)
Calcium: 9.2 mg/dL (ref 8.9–10.3)
Creatinine, Ser: 1.02 mg/dL — ABNORMAL HIGH (ref 0.44–1.00)
GFR calc Af Amer: >60 mL/min (ref >60)
GFR calc non Af Amer: >60 mL/min (ref >60)
GLUCOSE: 250 mg/dL — AB (ref 65–99)
POTASSIUM: 4.5 mmol/L (ref 3.5–5.1)
Sodium: 137 mmol/L (ref 135–145)

## 2014-10-26 LAB — GLUCOSE, CAPILLARY
GLUCOSE-CAPILLARY: 228 mg/dL — AB (ref 65–99)
GLUCOSE-CAPILLARY: 197 mg/dL — AB (ref 65–99)
Glucose-Capillary: 210 mg/dL — ABNORMAL HIGH (ref 65–99)
Glucose-Capillary: 219 mg/dL — ABNORMAL HIGH (ref 65–99)

## 2014-10-26 LAB — CBC
HCT: 29.5 % — ABNORMAL LOW (ref 36.0–46.0)
Hemoglobin: 8.6 g/dL — ABNORMAL LOW (ref 12.0–15.0)
MCH: 22.8 pg — AB (ref 26.0–34.0)
MCHC: 29.2 g/dL — AB (ref 30.0–36.0)
MCV: 78 fL (ref 78.0–100.0)
Platelets: 361 10*3/uL (ref 150–400)
RBC: 3.78 MIL/uL — ABNORMAL LOW (ref 3.87–5.11)
RDW: 30.1 % — ABNORMAL HIGH (ref 11.5–15.5)
WBC: 12.3 10*3/uL — ABNORMAL HIGH (ref 4.0–10.5)

## 2014-10-26 LAB — TROPONIN I
TROPONIN I: 0.03 ng/mL (ref <0.031)
Troponin I: <0.03 ng/mL (ref <0.031)

## 2014-10-26 LAB — HEPARIN LEVEL (UNFRACTIONATED)
HEPARIN UNFRACTIONATED: 0.53 [IU]/mL (ref 0.30–0.70)
Heparin Unfractionated: 0.53 IU/mL (ref 0.30–0.70)

## 2014-10-26 MED ORDER — HYDROCODONE-ACETAMINOPHEN 5-325 MG PO TABS
1.0000 | ORAL_TABLET | Freq: Four times a day (QID) | ORAL | Status: DC | PRN
  Administered 2014-10-26: 2 via ORAL
  Administered 2014-10-27 – 2014-10-30 (×7): 1 via ORAL
  Administered 2014-10-31 – 2014-11-02 (×4): 2 via ORAL
  Filled 2014-10-26: qty 2
  Filled 2014-10-26 (×2): qty 1
  Filled 2014-10-26: qty 2
  Filled 2014-10-26: qty 1
  Filled 2014-10-26 (×2): qty 2
  Filled 2014-10-26: qty 1
  Filled 2014-10-26: qty 2
  Filled 2014-10-26: qty 1
  Filled 2014-10-26: qty 2
  Filled 2014-10-26 (×2): qty 1

## 2014-10-26 MED ORDER — INSULIN ASPART 100 UNIT/ML ~~LOC~~ SOLN
0.0000 [IU] | Freq: Three times a day (TID) | SUBCUTANEOUS | Status: DC
  Administered 2014-10-26: 7 [IU] via SUBCUTANEOUS
  Administered 2014-10-26 – 2014-10-27 (×2): 4 [IU] via SUBCUTANEOUS
  Administered 2014-10-27: 3 [IU] via SUBCUTANEOUS
  Administered 2014-10-27 – 2014-10-28 (×2): 4 [IU] via SUBCUTANEOUS
  Administered 2014-10-28: 3 [IU] via SUBCUTANEOUS
  Administered 2014-10-29 – 2014-10-31 (×6): 4 [IU] via SUBCUTANEOUS
  Administered 2014-10-31 (×2): 11 [IU] via SUBCUTANEOUS
  Administered 2014-11-01 (×2): 4 [IU] via SUBCUTANEOUS
  Administered 2014-11-01 – 2014-11-02 (×2): 7 [IU] via SUBCUTANEOUS
  Administered 2014-11-02: 3 [IU] via SUBCUTANEOUS
  Administered 2014-11-02: 4 [IU] via SUBCUTANEOUS
  Administered 2014-11-03: 7 [IU] via SUBCUTANEOUS
  Administered 2014-11-03 – 2014-11-04 (×2): 4 [IU] via SUBCUTANEOUS
  Administered 2014-11-05: 3 [IU] via SUBCUTANEOUS
  Administered 2014-11-05 – 2014-11-06 (×4): 4 [IU] via SUBCUTANEOUS
  Administered 2014-11-07: 7 [IU] via SUBCUTANEOUS
  Administered 2014-11-07 – 2014-11-10 (×2): 3 [IU] via SUBCUTANEOUS

## 2014-10-26 MED ORDER — INSULIN ASPART 100 UNIT/ML ~~LOC~~ SOLN
0.0000 [IU] | Freq: Every day | SUBCUTANEOUS | Status: DC
  Administered 2014-10-26 – 2014-10-31 (×4): 2 [IU] via SUBCUTANEOUS
  Administered 2014-11-01: 3 [IU] via SUBCUTANEOUS

## 2014-10-26 MED ORDER — PNEUMOCOCCAL VAC POLYVALENT 25 MCG/0.5ML IJ INJ
0.5000 mL | INJECTION | INTRAMUSCULAR | Status: AC
  Administered 2014-10-27: 0.5 mL via INTRAMUSCULAR
  Filled 2014-10-26: qty 0.5

## 2014-10-26 MED ORDER — MORPHINE SULFATE 2 MG/ML IJ SOLN
2.0000 mg | Freq: Once | INTRAMUSCULAR | Status: AC
  Administered 2014-10-26: 2 mg via INTRAVENOUS
  Filled 2014-10-26: qty 1

## 2014-10-26 MED ORDER — GLUCERNA SHAKE PO LIQD
237.0000 mL | Freq: Two times a day (BID) | ORAL | Status: DC
  Administered 2014-10-26 – 2014-11-02 (×10): 237 mL via ORAL

## 2014-10-26 NOTE — Progress Notes (Signed)
ANTICOAGULATION CONSULT NOTE - Follow up  Pharmacy Consult for heparin Indication: pulmonary embolus  No Known Allergies  Patient Measurements: Height: 5\' 4"  (162.6 cm) Weight: 251 lb 12.8 oz (114.216 kg) IBW/kg (Calculated) : 54.7 Heparin Dosing Weight: 82.9kg  Vital Signs: Temp: 98 F (36.7 C) (08/01 0400) BP: 122/64 mmHg (08/01 0400) Pulse Rate: 93 (08/01 0600)  Labs:  Recent Labs  10/25/14 1831 10/25/14 2328 10/26/14 0540 10/26/14 0834  HGB 9.0*  --  8.6*  --   HCT 30.0*  --  29.5*  --   PLT 346  --  361  --   HEPARINUNFRC  --   --   --  0.53  CREATININE 1.20*  --  1.02*  --   TROPONINI <0.03 <0.03 0.03  --     Estimated Creatinine Clearance: 78.1 mL/min (by C-G formula based on Cr of 1.02).   Medical History: Past Medical History  Diagnosis Date  . Arthritis   . Diabetes mellitus without complication   . Hypertension   . Hyperlipidemia    Assessment: 65 yof presented to an outside hospital with SOB. Found to have a PE. She received a dose of lovenox 100mg  at ~1230pm on 10/25/14. Transferred to [facility] on 10/25/14 PM. Anticoagulation therapy changed to IV heparin infusion on admission to [facility].  The 8 hour heparin level is 0.53 on 1300 units/hr heparin IV.  Level is therapeutic. Slight decrease in Hgb to 8.6 this AM. PLTC wnl/stable. No bleeding noted.    Goal of Therapy:  Heparin level 0.3-0.7 units/ml Monitor platelets by anticoagulation protocol: Yes   Plan:  -Continue heparin gtt at 1300 units/hr - Check 6 hour heparin level to confirm remains therapeutic. - Daily heparin level and CBC - F/u plans for oral anticoagulation  Nicole Cella, RPh Clinical Pharmacist Pager: [phone] 10/26/2014,9:38 AM

## 2014-10-26 NOTE — Progress Notes (Signed)
[facility] PROGRESS NOTE  Krystal Campbell BMW:[id number] DOB: Dec 08, 1960 DOA: 10/25/2014  PCP: Josem Kaufmann, MD  Brief HPI: 54 year old Caucasian female with a past medical history of obesity, hypertension, diabetes and recently diagnosed anemia for which she was transfused about a week ago. Presented with cough, shortness of breath and was found to have acute pulmonary embolism. She was admitted to the hospital for further evaluation. She was actually transferred from [facility] in [facility].  Past medical history:  Past Medical History  Diagnosis Date  . Diabetes mellitus without complication   . Hypertension   . Hyperlipidemia   . Arthritis     in knees    Consultants: [facility]  Procedures:  Echocardiogram is pending  Antibiotics: None   Subjective: Patient still feels somewhat short of breath. Denies any chest pain, nausea, vomiting. Lightheadedness. Does have some vague abdominal discomfort. Admits to history of acid reflux and heartburn. Denies any blood in the stool. Denies any black colored stool.   Objective: Vital Signs  Filed Vitals:   10/26/14 0000 10/26/14 0040 10/26/14 0400 10/26/14 0600  BP: 113/68  122/64   Pulse: 100 99 98 93  Temp: 98.3 F (36.8 C)  98 F (36.7 C)   TempSrc:      Resp: 25 24 20 14   Height:      Weight:   114.216 kg (251 lb 12.8 oz)   SpO2: 94% 98%  98%    Intake/Output Summary (Last 24 hours) at 10/26/14 0833 Last data filed at 10/26/14 0805  Gross per 24 hour  Intake    360 ml  Output   2600 ml  Net  -2240 ml   Filed Weights   10/25/14 1704 10/26/14 0400  Weight: 116.937 kg (257 lb 12.8 oz) 114.216 kg (251 lb 12.8 oz)    General appearance: alert, cooperative, appears stated age, no distress and morbidly obese Resp: Coarse breath sounds bilaterally with no wheezing, rales or rhonchi. Cardio: regular rate and rhythm, S1, S2 normal, no murmur, click, rub or gallop GI: Soft. Vague  discomfort in the upper abdomen without any clear-cut rebound, rigidity or guarding. No masses or organomegaly. Bowel sounds present. Extremities: Nonpitting swelling noted in both lower extremities. Neurologic: No focal deficits  Lab Results:  Basic Metabolic Panel:  Recent Labs Lab 10/25/14 1831 10/26/14 0540  NA 133* 137  K 4.5 4.5  CL 99* 108  CO2 18* 24  GLUCOSE 358* 250*  BUN 11 10  CREATININE 1.20* 1.02*  CALCIUM 8.9 9.2   Liver Function Tests:  Recent Labs Lab 10/25/14 1831  AST 28  ALT 17  ALKPHOS 79  BILITOT 0.6  PROT 6.9  ALBUMIN 3.1*   CBC:  Recent Labs Lab 10/25/14 1831 10/26/14 0540  WBC 9.9 12.3*  HGB 9.0* 8.6*  HCT 30.0* 29.5*  MCV 77.1* 78.0  PLT 346 361   Cardiac Enzymes:  Recent Labs Lab 10/25/14 1831 10/25/14 2328 10/26/14 0540  TROPONINI <0.03 <0.03 0.03   CBG:  Recent Labs Lab 10/25/14 1820 10/25/14 2014 10/25/14 2239 10/26/14 0713  GLUCAP 351* 358* 274* 228*    Recent Results (from the past 240 hour(s))  MRSA PCR Screening     Status: None   Collection Time: 10/25/14  5:08 PM  Result Value Ref Range Status   MRSA by PCR NEGATIVE NEGATIVE Final    Comment:        The GeneXpert MRSA Assay (FDA approved for NASAL specimens only), is one  component of a comprehensive MRSA colonization surveillance program. It is not intended to diagnose MRSA infection nor to guide or monitor treatment for MRSA infections.       Studies/Results: Dg Chest Port 1 View  10/25/2014   CLINICAL DATA:  Shortness of breath, PE  EXAM: PORTABLE CHEST - 1 VIEW  COMPARISON:  None.  FINDINGS: Lungs are clear.  No pleural effusion or pneumothorax.  The heart is normal in size.  IMPRESSION: No evidence of acute cardiopulmonary disease.   Electronically Signed   By: Julian Hy M.D.   On: 10/25/2014 18:20    Medications:  Scheduled: . antiseptic oral rinse  7 mL Mouth Rinse BID  . gabapentin  800 mg Oral TID  . insulin aspart  0-20  Units Subcutaneous TID WC  . insulin aspart  0-5 Units Subcutaneous QHS  . pantoprazole  40 mg Oral BID  . [START ON 10/27/2014] pneumococcal 23 valent vaccine  0.5 mL Intramuscular Tomorrow-1000  . simvastatin  20 mg Oral q1800  . sodium chloride  3 mL Intravenous Q12H   Continuous: . heparin 1,300 Units/hr (10/26/14 0034)   CHE:[id number] **OR** acetaminophen, LORazepam, ondansetron **OR** ondansetron (ZOFRAN) IV  Assessment/Plan:  Active Problems:   Diabetes   Hypertension   GERD (gastroesophageal reflux disease)   Pulmonary emboli   Cecal lesion   Anxiety state    Acute pulmonary embolism with suggestion of right heart strain Patient's risk factors include her recent road trip to [place]. She was also hospitalized in the last couple of weeks. She also is noted to have circumferential thickening of her cecum and could have malignancy. Patient is currently hemodynamically stable. Heart rate is noted to be slightly high. Continue IV heparin for now. Hold off on starting oral anticoagulation till GI issue is resolved.  Thickening of the cecum noted on CT scan Concerning for malignancy in view of recently discovered microcytic anemia. She will need endoscopic evaluation prior to being placed on anticoagulation by mouth. Discussed with gastroenterology. They will consult on this patient.  History of GERD Continue PPI. May need upper endoscopy as well.  History of diabetes mellitus type 2 without any complications Continue sliding scale coverage. Monitor CBGs.  History of essential hypertension Continue monitoring blood pressure.  Microcytic anemia secondary to iron deficiency. She was transfused about 2 weeks ago. Her ferritin is 6. TIBC 528. Vitamin B-12 257. She may even benefit from B-12 supplementation.  DVT Prophylaxis: On IV heparin.    Code Status: Full code  Family Communication: Discussed with the patient  Disposition Plan: Await echocardiogram. Await GI  evaluation.     LOS: 1 day   [facility] Pager [phone] 10/26/2014, 8:33 AM  If 7PM-7AM, please contact night-coverage at [email or web address], password [facility]

## 2014-10-26 NOTE — Progress Notes (Signed)
Initial Nutrition Assessment  DOCUMENTATION CODES:   Morbid obesity  INTERVENTION:  Provide Glucerna Shake po BID, each supplement provides 220 kcal and 10 grams of protein  NUTRITION DIAGNOSIS:   Inadequate oral intake related to poor appetite, altered GI function as evidenced by per patient/family report.   GOAL:   Patient will meet greater than or equal to 90% of their needs   MONITOR:   PO intake, Labs, Skin, I & O's  REASON FOR ASSESSMENT:   Malnutrition Screening Tool    ASSESSMENT:   54 year old Caucasian female with a past medical history of obesity, hypertension, diabetes and recently diagnosed anemia for which she was transfused about a week ago. Presented with cough, shortness of breath and was found to have acute pulmonary embolism.  Pt states that she used to weigh 263 lbs, lost down to 243 lbs and has since regained some weight. She reports having abdominal pain after eating, she has only been able to tolerate small amounts of food per meal, not more than a handful. Per nursing notes, pt has been eating 50-75% of meals.  RD encouraged a general healthful diet. Provide list of iron-rich foods. Discussed how to incorporate Glucerna Shakes into nutrition regimen to improve nutrition status.   Labs: elevated glucose, low hemoglobin  Diet Order:  Diet Carb Modified Fluid consistency:: Thin; Room service appropriate?: Yes  Skin:  Reviewed, no issues  Last BM:  7/30  Height:   Ht Readings from Last 1 Encounters:  10/25/14 5\' 4"  (1.626 m)    Weight:   Wt Readings from Last 1 Encounters:  10/26/14 251 lb 12.8 oz (114.216 kg)    Ideal Body Weight:  54.5 kg  BMI:  Body mass index is 43.2 kg/(m^2).  Estimated Nutritional Needs:   Kcal:  1700-2000  Protein:  90-100 grams  Fluid:  2.8 L/day  EDUCATION NEEDS:   No education needs identified at this time  Pryor Ochoa RD, LDN Inpatient Clinical Dietitian Pager: [phone] After Hours Pager:  [phone]

## 2014-10-26 NOTE — Consult Note (Signed)
[facility] Gastroenterology Consultation Note  Referring Provider: Dr. Bonnielee Haff [facility]) Primary Care Physician:  Josem Kaufmann, MD Primary Gastroenterologist:  None  Reason for Consultation:  Anemia, abnormal CT scan  HPI: Krystal Campbell is a 54 y.o. female whom we've been asked to see for above reason.  For the past 6 months, patient has had extensive pica-like behaviors (pagophagia), as well as progressive fatigue and diminished energy.  For the past couple weeks, patient has had shortness of breath with minimal exertion.  She has had a few months of vague crampy post-prandial lower abdominal and periumbilical abdominal pains.  Poor appetite, early satiety behaviors as well.  No blood in stool.  No change in bowel habits.  Has lost about 20 lbs, unintentionally, over the past several months.  No prior colonoscopy.  Recent CT scan showed pulmonary embolism with heart strain as well as cecal thickening and adenopathy worrisome for malignancy.     Past Medical History  Diagnosis Date  . Arthritis   . Diabetes mellitus without complication   . Hypertension   . Hyperlipidemia     Past Surgical History  Procedure Laterality Date  . Replacement total knee    . Abdominal hysterectomy    . Kidney surgery      Prior to Admission medications   Medication Sig Start Date End Date Taking? Authorizing Provider  azithromycin (ZITHROMAX) 250 MG tablet Take 250 mg by mouth daily.   Yes Historical Provider, MD  ferrous sulfate 325 (65 FE) MG tablet Take 1 tablet (325 mg total) by mouth 2 (two) times daily with a meal. 10/11/14  Yes Kathie Dike, MD  gabapentin (NEURONTIN) 800 MG tablet Take 800 mg by mouth 3 (three) times daily.   Yes Historical Provider, MD  LORazepam (ATIVAN) 1 MG tablet Take 0.5 mg by mouth 3 (three) times daily as needed for anxiety.    Yes Historical Provider, MD  Melatonin 3 MG CAPS Take 6 mg by mouth at bedtime as needed (for sleep).    Yes Historical Provider, MD  metFORMIN  (GLUCOPHAGE) 1000 MG tablet Take 1,000 mg by mouth 2 (two) times daily.   Yes Historical Provider, MD  pantoprazole (PROTONIX) 40 MG tablet Take 1 tablet (40 mg total) by mouth 2 (two) times daily. 10/11/14  Yes Kathie Dike, MD  simvastatin (ZOCOR) 20 MG tablet Take 20 mg by mouth at bedtime.    Yes Historical Provider, MD    Current Facility-Administered Medications  Medication Dose Route Frequency Provider Last Rate Last Dose  . acetaminophen (TYLENOL) tablet 650 mg  650 mg Oral Q6H PRN Geradine Girt, DO   650 mg at 10/25/14 2233   Or  . acetaminophen (TYLENOL) suppository 650 mg  650 mg Rectal Q6H PRN Geradine Girt, DO      . antiseptic oral rinse (CPC / CETYLPYRIDINIUM CHLORIDE 0.05%) solution 7 mL  7 mL Mouth Rinse BID Jessica U Vann, DO   7 mL at 10/26/14 1000  . gabapentin (NEURONTIN) capsule 800 mg  800 mg Oral TID Geradine Girt, DO   800 mg at 10/26/14 0941  . heparin ADULT infusion 100 units/mL (25000 units/250 mL)  1,300 Units/hr Intravenous Continuous Valeda Malm Rumbarger, RPH 13 mL/hr at 10/26/14 0034 1,300 Units/hr at 10/26/14 0034  . insulin aspart (novoLOG) injection 0-20 Units  0-20 Units Subcutaneous TID WC Bonnielee Haff, MD      . insulin aspart (novoLOG) injection 0-5 Units  0-5 Units Subcutaneous QHS Bonnielee Haff, MD      .  LORazepam (ATIVAN) tablet 1 mg  1 mg Oral TID PRN Geradine Girt, DO   1 mg at 10/25/14 2233  . ondansetron (ZOFRAN) tablet 4 mg  4 mg Oral Q6H PRN Geradine Girt, DO       Or  . ondansetron (ZOFRAN) injection 4 mg  4 mg Intravenous Q6H PRN Geradine Girt, DO      . pantoprazole (PROTONIX) EC tablet 40 mg  40 mg Oral BID Geradine Girt, DO   40 mg at 10/26/14 0941  . simvastatin (ZOCOR) tablet 20 mg  20 mg Oral q1800 Geradine Girt, DO   20 mg at 10/25/14 2109  . sodium chloride 0.9 % injection 3 mL  3 mL Intravenous Q12H Geradine Girt, DO   0 mL at 10/25/14 2200    Allergies as of 10/25/2014  . (No Known Allergies)    Family History   Problem Relation Age of Onset  . Healthy Mother     no clotting issues in either parents  . Healthy Father     History   Social History  . Marital Status: Married    Spouse Name: N/A  . Number of Children: N/A  . Years of Education: N/A   Occupational History  . Not on file.   Social History Main Topics  . Smoking status: Former Research scientist (life sciences)  . Smokeless tobacco: Not on file  . Alcohol Use: Yes     Comment: rarely  . Drug Use: Not on file  . Sexual Activity: Not on file   Other Topics Concern  . Not on file   Social History Narrative    Review of Systems: Positive = bold Gen: Denies any fever, chills, rigors, night sweats, anorexia, fatigue, weakness, malaise, involuntary weight loss, and sleep disorder CV: Denies chest pain, angina, palpitations, syncope, orthopnea, PND, peripheral edema, and claudication. Resp: Denies dyspnea, cough, sputum, wheezing, coughing up blood. GI: Described in detail in HPI.    GU : Denies urinary burning, blood in urine, urinary frequency, urinary hesitancy, nocturnal urination, and urinary incontinence. MS: Denies joint pain or swelling.  Denies muscle weakness, cramps, atrophy.  Derm: Denies rash, itching, oral ulcerations, hives, unhealing ulcers.  Psych: Denies depression, anxiety, memory loss, suicidal ideation, hallucinations,  and confusion. Heme: Denies bruising, bleeding, and enlarged lymph nodes. Neuro:  Denies any headaches, dizziness, paresthesias. Endo:  Denies any problems with DM, thyroid, adrenal function.  Physical Exam: Vital signs in last 24 hours: Temp:  [98 F (36.7 C)-99.1 F (37.3 C)] 98 F (36.7 C) (08/01 0400) Pulse Rate:  [93-112] 93 (08/01 0600) Resp:  [14-25] 14 (08/01 0600) BP: (105-142)/(64-73) 122/64 mmHg (08/01 0400) SpO2:  [94 %-99 %] 98 % (08/01 0600) Weight:  [114.216 kg (251 lb 12.8 oz)-116.937 kg (257 lb 12.8 oz)] 114.216 kg (251 lb 12.8 oz) (08/01 0400) Last BM Date: 10/24/14 General:   Alert,   Well-developed, well-nourished, pleasant, obese, tachypneic at rest Head:  Normocephalic and atraumatic. Eyes:  Sclera clear, no icterus.   Conjunctiva pale Ears:  Normal auditory acuity. Nose:  No deformity, discharge,  or lesions. Mouth:  No deformity or lesions.  Oropharynx pale and somewhat dry Neck:  Supple; no masses or thyromegaly. Lungs:  Diminished aeration with fine crackles at bases.  Mild-to-moderate tachypneia at rest Heart:  Regular rate and rhythm; no murmurs, clicks, rubs,  or gallops. Abdomen:  Soft, protuberant, mild lower abdominal tenderness without peritonitis. No masses, hepatosplenomegaly or hernias noted. Normal bowel sounds, without  guarding, and without rebound.     Msk:  Symmetrical without gross deformities. Normal posture. Pulses:  Normal pulses noted. Extremities:  Without clubbing or edema. Neurologic:  Alert and  oriented x4; diffusely weak, otherwise grossly normal neurologically. Skin:  Intact without significant lesions or rashes. Cervical Nodes:  No significant cervical adenopathy. Psych:  Alert and cooperative. Normal mood and affect.   Lab Results:  Recent Labs  10/25/14 1831 10/26/14 0540  WBC 9.9 12.3*  HGB 9.0* 8.6*  HCT 30.0* 29.5*  PLT 346 361   BMET  Recent Labs  10/25/14 1831 10/26/14 0540  NA 133* 137  K 4.5 4.5  CL 99* 108  CO2 18* 24  GLUCOSE 358* 250*  BUN 11 10  CREATININE 1.20* 1.02*  CALCIUM 8.9 9.2   LFT  Recent Labs  10/25/14 1831  PROT 6.9  ALBUMIN 3.1*  AST 28  ALT 17  ALKPHOS 79  BILITOT 0.6   PT/INR No results for input(s): LABPROT, INR in the last 72 hours.  Studies/Results: Dg Chest Port 1 View  10/25/2014   CLINICAL DATA:  Shortness of breath, PE  EXAM: PORTABLE CHEST - 1 VIEW  COMPARISON:  None.  FINDINGS: Lungs are clear.  No pleural effusion or pneumothorax.  The heart is normal in size.  IMPRESSION: No evidence of acute cardiopulmonary disease.   Electronically Signed   By: Julian Hy  M.D.   On: 10/25/2014 18:20    Impression:  1.  Microcytic anemia.  In light of CT findings, suspect cecal malignancy. 2.  Shortness of breath; multifactorial (anemia, pulmonary embolism). 3.  Pulmonary embolism, perhaps incited by malignancy, reportedly with right heart strain. 4.  Weight loss, unintentional, suspicious for malignancy. 5.  Abnormal CT, cecal thickening and adenopathy worrisome for malignancy (per report; I can not locate the scans myself). 6.  Abdominal pain, periumbilical and lower abdominal, possibly related to #5 above. 7.  Early satiety.  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  Doubt plays role into her other constellation of symptoms.  Plan:  1.  Patient is still tachypneic and is in no shape for colonoscopy at this time. 2.  I would advise continued treatment for her pulmonary embolism (heparin gtt, not longer-acting pills).  We will continue to follow, and would anticipate pursuing colonoscopy towards the end of this week. 3.  If patient's respiratory status does not appreciably improve over the next couple days, we may request pulmonary consultation to obtain clearance prior to consideration of colonoscopy. 4.  Once colonoscopy is done, and in the unlikely event it is unrevealing, would consider endoscopy (under same sedation session) to further assess for GI tract source of microcytic anemia. 5.  Can advance diet for now, as colonoscopy is not planned at least for the next couple days. 6.  [facility] will follow.   LOS: 1 day   Raymona Boss M  10/26/2014, 10:15 AM  Pager [phone] If no answer or after 5 PM call [phone]

## 2014-10-26 NOTE — Progress Notes (Signed)
Utilization review completed. Tobby Fawcett, RN, BSN. 

## 2014-10-26 NOTE — Progress Notes (Signed)
Inpatient Diabetes Program Recommendations  AACE/ADA: New Consensus Statement on Inpatient Glycemic Control (2013)  Target Ranges:  Prepandial:   less than 140 mg/dL      Peak postprandial:   less than 180 mg/dL (1-2 hours)      Critically ill patients:  140 - 180 mg/dL   Results for REEM, FLEURY (MRN [id number]) as of 10/26/2014 08:36  Ref. Range 10/25/2014 18:20 10/25/2014 20:14 10/25/2014 22:39 10/26/2014 07:13  Glucose-Capillary Latest Ref Range: 65-99 mg/dL 351 (H) 358 (H) 274 (H) 228 (H)   Reason for Admission: SOB, PE, Anemia, Cecal Lesion  Diabetes history: DM 2 Outpatient Diabetes medications: Metformin 1,000 mg BID Current orders for Inpatient glycemic control: Novolog Moderate + HS scale  Inpatient Diabetes Program Recommendations Insulin - Basal: Glucose in 200-300 range. Please consider Lantus 12 units Q24hrs to reach inpatient glucose goal of < 180 mg/dl.  Thanks,  Tama Headings RN, MSN, [facility] Inpatient Diabetes Coordinator Team Pager [phone]

## 2014-10-26 NOTE — Progress Notes (Signed)
ANTICOAGULATION CONSULT NOTE - Follow up  Pharmacy Consult for heparin Indication: pulmonary embolus  No Known Allergies  Patient Measurements: Height: 5\' 4"  (162.6 cm) Weight: 251 lb 12.8 oz (114.216 kg) IBW/kg (Calculated) : 54.7 Heparin Dosing Weight: 82.9kg  Vital Signs: Temp: 98.2 F (36.8 C) (08/01 1334) Temp Source: Oral (08/01 1334) BP: 125/68 mmHg (08/01 1334) Pulse Rate: 93 (08/01 1334)  Labs:  Recent Labs  10/25/14 1831 10/25/14 2328 10/26/14 0540 10/26/14 0834 10/26/14 1456  HGB 9.0*  --  8.6*  --   --   HCT 30.0*  --  29.5*  --   --   PLT 346  --  361  --   --   HEPARINUNFRC  --   --   --  0.53 0.53  CREATININE 1.20*  --  1.02*  --   --   TROPONINI <0.03 <0.03 0.03  --   --     Estimated Creatinine Clearance: 78.1 mL/min (by C-G formula based on Cr of 1.02).   Medical History: Past Medical History  Diagnosis Date  . Diabetes mellitus without complication   . Hypertension   . Hyperlipidemia   . Arthritis     in knees   Assessment: 54 yo female currently on IV heparin infusion for new/acute PE./DVT. LUE venous duplex today is negative for DVT bilaterally; superficial thrombosis of the right lesser saphenous vein. This afternoon the second heparin level is 0.53. Confirms level remains therapeutic on 1300 units/hr IV heparin drip. Admit H/H was low and this morning's CBC result showed a decrease in Hgb to 8.6 this AM and PLTC wnl/stable. No bleeding noted. GI consulted for microcytic anemia and notes that in light of CT findings, suspect cecal malignancy. No blood in stool noted. GI MD recommends colonoscopy towards the end of this week. No other signs and sx of bleeding noted.    Goal of Therapy:  Heparin level 0.3-0.7 units/ml Monitor platelets by anticoagulation protocol: Yes   Plan:  Continue heparin gtt at 1300 units/hr Daily heparin level and CBC F/u plans for colonoscopy and plan for oral anticoagulation  Nicole Cella, RPh Clinical  Pharmacist Pager: [phone] 10/26/2014,3:49 PM

## 2014-10-26 NOTE — Progress Notes (Signed)
*  PRELIMINARY RESULTS* Vascular Ultrasound Lower extremity venous duplex has been completed.  Preliminary findings:   Negative for DVT bilat.  Superficial thrombosis of the right lesser saphenous vein. Left baker's cyst.   Landry Mellow, RDMS, RVT  10/26/2014, 3:15 PM

## 2014-10-27 ENCOUNTER — Ambulatory Visit (HOSPITAL_COMMUNITY): Payer: 59

## 2014-10-27 DIAGNOSIS — R0600 Dyspnea, unspecified: Secondary | ICD-10-CM

## 2014-10-27 LAB — COMPREHENSIVE METABOLIC PANEL
ALBUMIN: 2.9 g/dL — AB (ref 3.5–5.0)
ALT: 14 U/L (ref 14–54)
AST: 21 U/L (ref 15–41)
Alkaline Phosphatase: 66 U/L (ref 38–126)
Anion gap: 11 (ref 5–15)
BILIRUBIN TOTAL: 0.5 mg/dL (ref 0.3–1.2)
BUN: 13 mg/dL (ref 6–20)
CO2: 25 mmol/L (ref 22–32)
CREATININE: 1.11 mg/dL — AB (ref 0.44–1.00)
Calcium: 8.8 mg/dL — ABNORMAL LOW (ref 8.9–10.3)
Chloride: 102 mmol/L (ref 101–111)
GFR calc Af Amer: >60 mL/min (ref >60)
GFR calc non Af Amer: 55 mL/min — ABNORMAL LOW (ref >60)
GLUCOSE: 177 mg/dL — AB (ref 65–99)
POTASSIUM: 4 mmol/L (ref 3.5–5.1)
Sodium: 138 mmol/L (ref 135–145)
Total Protein: 6.3 g/dL — ABNORMAL LOW (ref 6.5–8.1)

## 2014-10-27 LAB — GLUCOSE, CAPILLARY
GLUCOSE-CAPILLARY: 130 mg/dL — AB (ref 65–99)
GLUCOSE-CAPILLARY: 235 mg/dL — AB (ref 65–99)
Glucose-Capillary: 188 mg/dL — ABNORMAL HIGH (ref 65–99)
Glucose-Capillary: 163 mg/dL — ABNORMAL HIGH (ref 65–99)

## 2014-10-27 LAB — CBC
HCT: 30 % — ABNORMAL LOW (ref 36.0–46.0)
Hemoglobin: 8.8 g/dL — ABNORMAL LOW (ref 12.0–15.0)
MCH: 23.2 pg — AB (ref 26.0–34.0)
MCHC: 29.3 g/dL — ABNORMAL LOW (ref 30.0–36.0)
MCV: 78.9 fL (ref 78.0–100.0)
Platelets: 359 10*3/uL (ref 150–400)
RBC: 3.8 MIL/uL — ABNORMAL LOW (ref 3.87–5.11)
RDW: 30.6 % — ABNORMAL HIGH (ref 11.5–15.5)
WBC: 10.2 10*3/uL (ref 4.0–10.5)

## 2014-10-27 LAB — HEPARIN LEVEL (UNFRACTIONATED): HEPARIN UNFRACTIONATED: 0.43 [IU]/mL (ref 0.30–0.70)

## 2014-10-27 MED ORDER — VITAMIN B-12 1000 MCG PO TABS
1000.0000 ug | ORAL_TABLET | Freq: Every day | ORAL | Status: DC
  Administered 2014-10-28 – 2014-11-10 (×11): 1000 ug via ORAL
  Filled 2014-10-27 (×12): qty 1

## 2014-10-27 NOTE — Progress Notes (Signed)
Subjective: Still with shortness of breath, not significantly improved cf. Yesterday. Still with post-prandial lower abdominal pain, no change.  Objective: Vital signs in last 24 hours: Temp:  [97.9 F (36.6 C)-98.7 F (37.1 C)] 97.9 F (36.6 C) (08/02 0500) Pulse Rate:  [88-95] 88 (08/02 0500) Resp:  [19-29] 19 (08/02 0500) BP: (114-136)/(47-68) 114/61 mmHg (08/02 0500) SpO2:  [96 %-98 %] 98 % (08/02 0500) Weight change:  Last BM Date: 10/24/14  PE: GEN:  Obese, slight tachypneia at rest ABD:  Soft, redundant, mild lower abdominal tenderness without peritonitis  Lab Results: CBC    Component Value Date/Time   WBC 10.2 10/27/2014 0409   RBC 3.80* 10/27/2014 0409   RBC 3.60* 10/09/2014 2005   HGB 8.8* 10/27/2014 0409   HCT 30.0* 10/27/2014 0409   PLT 359 10/27/2014 0409   MCV 78.9 10/27/2014 0409   MCH 23.2* 10/27/2014 0409   MCHC 29.3* 10/27/2014 0409   RDW 30.6* 10/27/2014 0409   LYMPHSABS 1.6 10/09/2014 2005   MONOABS 0.5 10/09/2014 2005   EOSABS 0.3 10/09/2014 2005   BASOSABS 0.0 10/09/2014 2005   CMP     Component Value Date/Time   NA 138 10/27/2014 0409   K 4.0 10/27/2014 0409   CL 102 10/27/2014 0409   CO2 25 10/27/2014 0409   GLUCOSE 177* 10/27/2014 0409   BUN 13 10/27/2014 0409   CREATININE 1.11* 10/27/2014 0409   CALCIUM 8.8* 10/27/2014 0409   PROT 6.3* 10/27/2014 0409   ALBUMIN 2.9* 10/27/2014 0409   AST 21 10/27/2014 0409   ALT 14 10/27/2014 0409   ALKPHOS 66 10/27/2014 0409   BILITOT 0.5 10/27/2014 0409   GFRNONAA 55* 10/27/2014 0409   GFRAA >60 10/27/2014 0409   Assessment:  1. Microcytic anemia. In light of CT findings, suspect cecal malignancy. 2. Shortness of breath; multifactorial (anemia, pulmonary embolism). 3. Pulmonary embolism, perhaps incited by malignancy, reportedly with right heart strain. 4. Weight loss, unintentional, suspicious for malignancy. 5. Abnormal CT, cecal thickening and adenopathy worrisome for malignancy  (per report; I can not locate the scans myself). 6. Abdominal pain, periumbilical and lower abdominal, possibly related to #5 above. 7. Early satiety. 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 Doubt plays role into her other constellation of symptoms.  Plan:  1.  Patient still too tenuous from pulmonary standpoint for colonoscopy. 2.  If shortness of breath not better tomorrow, would consider pulmonary consultation to obtain clearance for her to have colonoscopy; if she still has heart strain, might need to consider deferring colonoscopy for a bit. 3.  Continue solid diet, as there are no plans for bowel preparation today. 4.  Will revisit tomorrow.   Landry Dyke 10/27/2014, 10:40 AM   Pager [phone] If no answer or after 5 PM call [phone]

## 2014-10-27 NOTE — Progress Notes (Signed)
[facility] PROGRESS NOTE  Krystal Campbell JEH:[id number] DOB: 03/26/1961 DOA: 10/25/2014  PCP: Josem Kaufmann, MD  Brief HPI: 54 year old Caucasian female with a past medical history of obesity, hypertension, diabetes and recently diagnosed anemia for which she was transfused about a week ago. Presented with cough, shortness of breath and was found to have acute pulmonary embolism. She was also noted to have circumferential thickening of her cecum on CT scan. She was admitted to the hospital for further evaluation. She was actually transferred from [facility] in [facility].  Past medical history:  Past Medical History  Diagnosis Date  . Diabetes mellitus without complication   . Hypertension   . Hyperlipidemia   . Arthritis     in knees    Consultants: [facility]  Procedures:  Echocardiogram is pending  Lower extremity venous Dopplers Negative for DVT bilat.  Superficial thrombosis of the right lesser saphenous vein. Left baker's cyst.  Antibiotics: None   Subjective: Patient feels a little better compared to yesterday. Still short of breath and somewhat dizzy at times. Continues to have abdominal discomfort but denies any vomiting or diarrhea.    Objective: Vital Signs  Filed Vitals:   10/26/14 1230 10/26/14 1334 10/26/14 2046 10/27/14 0500  BP: 136/47 125/68 116/55 114/61  Pulse:  93 95 88  Temp:  98.2 F (36.8 C) 98.7 F (37.1 C) 97.9 F (36.6 C)  TempSrc:  Oral    Resp: 29 19 20 19   Height:      Weight:      SpO2:  96% 98% 98%    Intake/Output Summary (Last 24 hours) at 10/27/14 0811 Last data filed at 10/27/14 0700  Gross per 24 hour  Intake      0 ml  Output   2150 ml  Net  -2150 ml   Filed Weights   10/25/14 1704 10/26/14 0400  Weight: 116.937 kg (257 lb 12.8 oz) 114.216 kg (251 lb 12.8 oz)    General appearance: alert, cooperative, appears stated age, no distress and morbidly obese Resp: Coarse breath sounds  bilaterally with no wheezing, rales or rhonchi. Cardio: regular rate and rhythm, S1, S2 normal, no murmur, click, rub or gallop GI: Soft. Vague discomfort in the upper abdomen without any clear-cut rebound, rigidity or guarding. No masses or organomegaly. Bowel sounds present. Extremities: Nonpitting swelling noted in both lower extremities. Neurologic: No focal deficits  Lab Results:  Basic Metabolic Panel:  Recent Labs Lab 10/25/14 1831 10/26/14 0540 10/27/14 0409  NA 133* 137 138  K 4.5 4.5 4.0  CL 99* 108 102  CO2 18* 24 25  GLUCOSE 358* 250* 177*  BUN 11 10 13   CREATININE 1.20* 1.02* 1.11*  CALCIUM 8.9 9.2 8.8*   Liver Function Tests:  Recent Labs Lab 10/25/14 1831 10/27/14 0409  AST 28 21  ALT 17 14  ALKPHOS 79 66  BILITOT 0.6 0.5  PROT 6.9 6.3*  ALBUMIN 3.1* 2.9*   CBC:  Recent Labs Lab 10/25/14 1831 10/26/14 0540 10/27/14 0409  WBC 9.9 12.3* 10.2  HGB 9.0* 8.6* 8.8*  HCT 30.0* 29.5* 30.0*  MCV 77.1* 78.0 78.9  PLT 346 361 359   Cardiac Enzymes:  Recent Labs Lab 10/25/14 1831 10/25/14 2328 10/26/14 0540  TROPONINI <0.03 <0.03 0.03   CBG:  Recent Labs Lab 10/26/14 0713 10/26/14 1130 10/26/14 1619 10/26/14 2030 10/27/14 0728  GLUCAP 228* 210* 197* 219* 130*    Recent Results (from the past 240 hour(s))  MRSA  PCR Screening     Status: None   Collection Time: 10/25/14  5:08 PM  Result Value Ref Range Status   MRSA by PCR NEGATIVE NEGATIVE Final    Comment:        The GeneXpert MRSA Assay (FDA approved for NASAL specimens only), is one component of a comprehensive MRSA colonization surveillance program. It is not intended to diagnose MRSA infection nor to guide or monitor treatment for MRSA infections.       Studies/Results: Dg Chest Port 1 View  10/25/2014   CLINICAL DATA:  Shortness of breath, PE  EXAM: PORTABLE CHEST - 1 VIEW  COMPARISON:  None.  FINDINGS: Lungs are clear.  No pleural effusion or pneumothorax.  The heart  is normal in size.  IMPRESSION: No evidence of acute cardiopulmonary disease.   Electronically Signed   By: Julian Hy M.D.   On: 10/25/2014 18:20    Medications:  Scheduled: . antiseptic oral rinse  7 mL Mouth Rinse BID  . feeding supplement (GLUCERNA SHAKE)  237 mL Oral BID PC  . gabapentin  800 mg Oral TID  . insulin aspart  0-20 Units Subcutaneous TID WC  . insulin aspart  0-5 Units Subcutaneous QHS  . pantoprazole  40 mg Oral BID  . pneumococcal 23 valent vaccine  0.5 mL Intramuscular Tomorrow-1000  . simvastatin  20 mg Oral q1800  . sodium chloride  3 mL Intravenous Q12H   Continuous: . heparin 1,300 Units/hr (10/26/14 0034)   PTW:[id number] **OR** acetaminophen, HYDROcodone-acetaminophen, LORazepam, ondansetron **OR** ondansetron (ZOFRAN) IV  Assessment/Plan:  Principal Problem:   Acute pulmonary embolism Active Problems:   Diabetes   Hypertension   GERD (gastroesophageal reflux disease)   Pulmonary emboli   Cecal lesion   Anxiety state   Iron deficiency anemia    Acute pulmonary embolism with suggestion of right heart strain Patient's risk factors include her recent road trip to [place]. She was also hospitalized in the last couple of weeks. She also is noted to have circumferential thickening of her cecum and could have malignancy. Patient is currently hemodynamically stable. She was tachycardic on presentation. Heart rate is improved. Continue IV heparin for now. Venous Doppler study shows superficial thrombosis of right lesser saphenous vein. Left Baker's cyst noted. Echocardiogram is pending. Hold off on starting oral anticoagulation till plan regarding GI workup is clear.   Thickening of the cecum noted on CT scan Concerning for malignancy in view of recently discovered microcytic anemia. She may need endoscopic evaluation prior to being placed on anticoagulation by mouth. Gastroenterology is following.  History of GERD Continue PPI. May need upper  endoscopy as well.  History of diabetes mellitus type 2 without any complications Continue sliding scale coverage. Monitor CBGs.  History of essential hypertension Continue monitoring blood pressure.  Microcytic anemia secondary to iron deficiency. She was transfused about 2 weeks ago. Hemoglobin is stable. Her ferritin is 6. TIBC 528. Vitamin B-12 257. She may even benefit from B-12 supplementation. This has been ordered.  DVT Prophylaxis: On IV heparin.    Code Status: Full code  Family Communication: Discussed with the patient  Disposition Plan: Await echocardiogram. Await improvement in pulmonary status so that endoscopy/colonoscopy can be performed.     LOS: 2 days   [facility] Pager [phone] 10/27/2014, 8:11 AM  If 7PM-7AM, please contact night-coverage at [email or web address], password [facility]

## 2014-10-27 NOTE — Progress Notes (Signed)
ANTICOAGULATION CONSULT NOTE - Follow up  Pharmacy Consult for heparin Indication: pulmonary embolus  No Known Allergies  Patient Measurements: Height: 5\' 4"  (162.6 cm) Weight: 251 lb 12.8 oz (114.216 kg) IBW/kg (Calculated) : 54.7 Heparin Dosing Weight: 82.9kg  Vital Signs: Temp: 97.9 F (36.6 C) (08/02 0500) BP: 114/61 mmHg (08/02 0500) Pulse Rate: 88 (08/02 0500)  Labs:  Recent Labs  10/25/14 1831 10/25/14 2328 10/26/14 0540 10/26/14 0834 10/26/14 1456 10/27/14 0409  HGB 9.0*  --  8.6*  --   --  8.8*  HCT 30.0*  --  29.5*  --   --  30.0*  PLT 346  --  361  --   --  359  HEPARINUNFRC  --   --   --  0.53 0.53 0.43  CREATININE 1.20*  --  1.02*  --   --  1.11*  TROPONINI <0.03 <0.03 0.03  --   --   --     Estimated Creatinine Clearance: 71.8 mL/min (by C-G formula based on Cr of 1.11).   Medical History: Past Medical History  Diagnosis Date  . Diabetes mellitus without complication   . Hypertension   . Hyperlipidemia   . Arthritis     in knees   Assessment: 54 yo morbidly obese female currently on IV heparin infusion for new/acute PE./DVT. LUE venous duplex negative for DVT bilaterally; superficial thrombosis of the right lesser saphenous vein. Today the heparin level is 0.43, remains therapeutic on 1300 units/hr IV heparin drip. GI consulted for microcytic anemia and notes that in light of CT findings, suspect cecal malignancy. GI MD recommends colonoscopy towards the end of this week. H/H low/stable, pltc wnl/stable. No bleeding noted. Inadequate oral intake related to poor appetite, altered GI function.    Goal of Therapy:  Heparin level 0.3-0.7 units/ml Monitor platelets by anticoagulation protocol: Yes   Plan:  Continue heparin gtt at 1300 units/hr Daily heparin level and CBC F/u plans for colonoscopy and plan for oral anticoagulation  Nicole Cella, RPh Clinical Pharmacist Pager: [phone] 10/27/2014,9:19 AM

## 2014-10-27 NOTE — Progress Notes (Signed)
  Echocardiogram 2D Echocardiogram has been performed.  Krystal Campbell 10/27/2014, 9:45 AM

## 2014-10-28 LAB — CBC
HEMATOCRIT: 33.9 % — AB (ref 36.0–46.0)
Hemoglobin: 9.9 g/dL — ABNORMAL LOW (ref 12.0–15.0)
MCH: 23.4 pg — AB (ref 26.0–34.0)
MCHC: 29.2 g/dL — ABNORMAL LOW (ref 30.0–36.0)
MCV: 80.1 fL (ref 78.0–100.0)
PLATELETS: 416 10*3/uL — AB (ref 150–400)
RBC: 4.23 MIL/uL (ref 3.87–5.11)
RDW: 30.2 % — ABNORMAL HIGH (ref 11.5–15.5)
WBC: 7.8 10*3/uL (ref 4.0–10.5)

## 2014-10-28 LAB — GLUCOSE, CAPILLARY
GLUCOSE-CAPILLARY: 188 mg/dL — AB (ref 65–99)
Glucose-Capillary: 143 mg/dL — ABNORMAL HIGH (ref 65–99)
Glucose-Capillary: 138 mg/dL — ABNORMAL HIGH (ref 65–99)
Glucose-Capillary: 176 mg/dL — ABNORMAL HIGH (ref 65–99)

## 2014-10-28 LAB — BASIC METABOLIC PANEL
ANION GAP: 9 (ref 5–15)
BUN: 9 mg/dL (ref 6–20)
CO2: 27 mmol/L (ref 22–32)
CREATININE: 1.1 mg/dL — AB (ref 0.44–1.00)
Calcium: 9.1 mg/dL (ref 8.9–10.3)
Chloride: 105 mmol/L (ref 101–111)
GFR calc Af Amer: >60 mL/min (ref >60)
GFR, EST NON AFRICAN AMERICAN: 56 mL/min — AB (ref >60)
Glucose, Bld: 171 mg/dL — ABNORMAL HIGH (ref 65–99)
Potassium: 3.9 mmol/L (ref 3.5–5.1)
Sodium: 141 mmol/L (ref 135–145)

## 2014-10-28 LAB — HEPARIN LEVEL (UNFRACTIONATED)
Heparin Unfractionated: 0.18 IU/mL — ABNORMAL LOW (ref 0.30–0.70)
Heparin Unfractionated: 0.36 IU/mL (ref 0.30–0.70)

## 2014-10-28 NOTE — Progress Notes (Signed)
ANTICOAGULATION CONSULT NOTE - Follow Up Consult  Pharmacy Consult for heparin Indication: pulmonary embolus   Labs:  Recent Labs  10/25/14 1831 10/25/14 2328 10/26/14 0540  10/26/14 1456 10/27/14 0409 10/28/14 0500  HGB 9.0*  --  8.6*  --   --  8.8*  --   HCT 30.0*  --  29.5*  --   --  30.0*  --   PLT 346  --  361  --   --  359  --   HEPARINUNFRC  --   --   --   < > 0.53 0.43 0.18*  CREATININE 1.20*  --  1.02*  --   --  1.11*  --   TROPONINI <0.03 <0.03 0.03  --   --   --   --   < > = values in this interval not displayed.    Assessment: 54yo female now subtherapeutic on heparin after multiple levels at goal, no gtt issues per RN.  Goal of Therapy:  Heparin level 0.3-0.7 units/ml   Plan:  Will increase heparin gtt by 3 units/kg/hr to 1600 units/hr and check level in 6hr.  Wynona Neat, PharmD, BCPS  10/28/2014,6:08 AM

## 2014-10-28 NOTE — Progress Notes (Signed)
Subjective: Still with pleuritic shortness of breath, and dyspnea with minimal exertion. Some lower post-prandial abdominal pain persists.  Objective: Vital signs in last 24 hours: Temp:  [98.2 F (36.8 C)-98.9 F (37.2 C)] 98.5 F (36.9 C) (08/03 0600) Pulse Rate:  [88-96] 96 (08/03 0600) Resp:  [18] 18 (08/03 0600) BP: (113-125)/(64-67) 125/67 mmHg (08/03 0600) SpO2:  [98 %-100 %] 99 % (08/03 0600) Weight:  [113.399 kg (250 lb)] 113.399 kg (250 lb) (08/03 0600) Weight change:  Last BM Date: 10/24/14  PE: GEN:  Obese, some tachypneia at rest. ABD:  Protuberant, soft, mild lower abdominal tenderness without peritonitis  Lab Results: CBC    Component Value Date/Time   WBC 7.8 10/28/2014 0354   RBC 4.23 10/28/2014 0354   RBC 3.60* 10/09/2014 2005   HGB 9.9* 10/28/2014 0354   HCT 33.9* 10/28/2014 0354   PLT 416* 10/28/2014 0354   MCV 80.1 10/28/2014 0354   MCH 23.4* 10/28/2014 0354   MCHC 29.2* 10/28/2014 0354   RDW 30.2* 10/28/2014 0354   LYMPHSABS 1.6 10/09/2014 2005   MONOABS 0.5 10/09/2014 2005   EOSABS 0.3 10/09/2014 2005   BASOSABS 0.0 10/09/2014 2005   CMP     Component Value Date/Time   NA 141 10/28/2014 0354   K 3.9 10/28/2014 0354   CL 105 10/28/2014 0354   CO2 27 10/28/2014 0354   GLUCOSE 171* 10/28/2014 0354   BUN 9 10/28/2014 0354   CREATININE 1.10* 10/28/2014 0354   CALCIUM 9.1 10/28/2014 0354   PROT 6.3* 10/27/2014 0409   ALBUMIN 2.9* 10/27/2014 0409   AST 21 10/27/2014 0409   ALT 14 10/27/2014 0409   ALKPHOS 66 10/27/2014 0409   BILITOT 0.5 10/27/2014 0409   GFRNONAA 56* 10/28/2014 0354   GFRAA >60 10/28/2014 0354   Assessment:  1. Microcytic anemia. In light of CT findings, suspect cecal malignancy. 2. Shortness of breath; multifactorial (anemia, pulmonary embolism). 3. Pulmonary embolism, perhaps incited by malignancy, reportedly with right heart strain. 4. Weight loss, unintentional, suspicious for malignancy. 5. Abnormal CT,  cecal thickening and adenopathy worrisome for malignancy (per report; I can not locate the scans myself). 6. Abdominal pain, periumbilical and lower abdominal, possibly related to #5 above. 7. Early satiety. 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 Doubt plays role into her other constellation of symptoms.  Plan:  1.  Given persistent respiratory complaints and bilateral pulmonary emboli on recent CT scan with right heart strain, I will obtain pulmonary consultation for clearance prior to consideration of colonoscopy.  Certainly colonoscopy is not urgent at this time, but ideally would like to do during this admission, as if cancer or something in need of operation is noted on colonoscopy, it would be seemingly preferable to take care of that while she is still hospitalized on heparin instead of having to reverse a longer-acting oral agent some point down-the-road. 2.  Will await pulmonary consultant opinion prior to consideration of scheduling endoscopy +/- colonoscopy. 3.  Will follow.   Landry Dyke 10/28/2014, 8:50 AM   Pager [phone] If no answer or after 5 PM call [phone]

## 2014-10-28 NOTE — Consult Note (Signed)
Name: Krystal Campbell MRN: [id number] DOB: 02-08-1961    ADMISSION DATE:  10/25/2014 CONSULTATION DATE:  10/28/2014  REFERRING MD :  Paulita Fujita  CHIEF COMPLAINT:  SOB due to PE > pre-op clearance for colonoscopy.  BRIEF PATIENT DESCRIPTION: 54 y.o. F found to have bilateral PE's along with cecal wall thickening suspicious for malignancy.  PCCM called for clearance prior to colonoscopy.  SIGNIFICANT EVENTS  7/31 - presented to [facility], found to have PE and cecal wall thickening.  Transferred to [facility]. 8/3 - PCCM consulted for pre-op clearance prior to colonoscopy.  STUDIES:  CT chest 7/31 >>> B/l PE with CT evidence of RHS (RV / LV = 1.2). CT A / P 7/31 >>> Cecal wall thickening and enlarged right mesenteric lymph nodes - malignancy and lymphatic spread is not excluded. LE duplex 8/1 >>> negative for DVT, superficial thrombosis of right lesser saphenous vein.  Left bakers cyst. Echo 8/2 >>> EF 60-65%, no RWMA's.  RV normal.   HISTORY OF PRESENT ILLNESS:  [name] is a 54 y.o. F with PMH as outlined below.  She was seen at [facility] originally around July 15 for SOB, fatigue, and pica.  She was found to have anemia so was transfused and instructed that she needed to see GI as an outpatient for an EGD and colonoscopy.  She was planning on seeing GI after a few weeks; however, roughly 1 week later, her daughter who lives in [place] was in a car wreck so pt drove to [facility] and back overnight to check on her.  Shortly prior to the trip she had cough and SOB; however, after the trip, symptoms worsened.  She saw her PCP after she got back and was put on a z-pack. After completing z-pack, symptoms persisted so on 10/25/14 she went to [facility] for further evaluation.  At [facility], CT of the chest revealed bilateral PE with RV / LV of 1.2.  She also had CT of the abd / pelv which showed cecal wall thickening with right mesenteric lymph node.  She was started on heparin and was sent to [facility] for further workup.   At [facility], she was seen by Dr. Paulita Fujita of GI.  He would like to move forward with colonoscopy to further assess cecal thickening; however, due to her SOB and acute bilateral PE; he requested that she be evaluated by PCCM prior to proceeding with colonoscopy.    PAST MEDICAL HISTORY :   has a past medical history of Diabetes mellitus without complication; Hypertension; Hyperlipidemia; and Arthritis.  has past surgical history that includes Replacement total knee; Abdominal hysterectomy; Kidney surgery; and Joint replacement (4/15). Prior to Admission medications   Medication Sig Start Date End Date Taking? Authorizing Provider  azithromycin (ZITHROMAX) 250 MG tablet Take 250 mg by mouth daily.   Yes Historical Provider, MD  ferrous sulfate 325 (65 FE) MG tablet Take 1 tablet (325 mg total) by mouth 2 (two) times daily with a meal. 10/11/14  Yes Kathie Dike, MD  gabapentin (NEURONTIN) 800 MG tablet Take 800 mg by mouth 3 (three) times daily.   Yes Historical Provider, MD  LORazepam (ATIVAN) 1 MG tablet Take 0.5 mg by mouth 3 (three) times daily as needed for anxiety.    Yes Historical Provider, MD  Melatonin 3 MG CAPS Take 6 mg by mouth at bedtime as needed (for sleep).    Yes Historical Provider, MD  metFORMIN (GLUCOPHAGE) 1000 MG tablet Take 1,000 mg by mouth 2 (two) times daily.  Yes Historical Provider, MD  pantoprazole (PROTONIX) 40 MG tablet Take 1 tablet (40 mg total) by mouth 2 (two) times daily. 10/11/14  Yes Kathie Dike, MD  simvastatin (ZOCOR) 20 MG tablet Take 20 mg by mouth at bedtime.    Yes Historical Provider, MD   No Known Allergies  FAMILY HISTORY:  family history includes Healthy in her father and mother. SOCIAL HISTORY:  reports that she has quit smoking. She does not have any smokeless tobacco history on file. She reports that she drinks alcohol.  REVIEW OF SYSTEMS:   All negative; except for those that are bolded, which indicate positives.  Constitutional: weight loss,  weight gain, night sweats, fevers, chills, fatigue, weakness.  HEENT: headaches, sore throat, sneezing, nasal congestion, post nasal drip, difficulty swallowing, tooth/dental problems, visual complaints, visual changes, ear aches. Neuro: difficulty with speech, weakness, numbness, ataxia. CV:  chest pain, orthopnea, PND, swelling in lower extremities, dizziness, palpitations, syncope.  Resp: cough - nonproductive, hemoptysis, dyspnea, wheezing. GI  heartburn, indigestion, abdominal pain, nausea, vomiting, diarrhea, constipation, change in bowel habits, loss of appetite, hematemesis, melena, hematochezia.  GU: dysuria, change in color of urine, urgency or frequency, flank pain, hematuria. MSK: joint pain or swelling, decreased range of motion. Psych: change in mood or affect, depression, anxiety, suicidal ideations, homicidal ideations. Skin: rash, itching, bruising.   SUBJECTIVE:  SOB much improved, she is on RA with SpO2 of 99%.  VITAL SIGNS: Temp:  [98.2 F (36.8 C)-98.9 F (37.2 C)] 98.5 F (36.9 C) (08/03 0600) Pulse Rate:  [88-96] 96 (08/03 0600) Resp:  [18] 18 (08/03 0600) BP: (113-125)/(64-67) 125/67 mmHg (08/03 0600) SpO2:  [98 %-100 %] 99 % (08/03 0600) Weight:  [113.399 kg (250 lb)] 113.399 kg (250 lb) (08/03 0600)  PHYSICAL EXAMINATION: General: Pleasant adult female, resting in bed, in NAD. Neuro: A&O x 3, non-focal.  HEENT: [place]/AT. PERRL, sclerae anicteric. Cardiovascular: RRR, no M/R/G.  Lungs: Respirations even and unlabored.  CTA bilaterally, No W/R/R.  Abdomen: Obese, abdomen soft, NT/ND.  Musculoskeletal: No gross deformities, no edema.  Skin: Intact, warm, no rashes.    Recent Labs Lab 10/26/14 0540 10/27/14 0409 10/28/14 0354  NA 137 138 141  K 4.5 4.0 3.9  CL 108 102 105  CO2 24 25 27   BUN 10 13 9   CREATININE 1.02* 1.11* 1.10*  GLUCOSE 250* 177* 171*    Recent Labs Lab 10/26/14 0540 10/27/14 0409 10/28/14 0354  HGB 8.6* 8.8* 9.9*  HCT 29.5*  30.0* 33.9*  WBC 12.3* 10.2 7.8  PLT 361 359 416*   No results found.  ASSESSMENT / PLAN:  Acute Bilateral Pulmonary Embolism.  Discussion: It is unclear whether this was provoked from recent prolonged car trip to [place] and back to [facility]; or whether this is due to underlying malignancy.  Her symptoms did however start prior to her trip, leading me to suspect that this was not the inciting event.  Although her CT scan suggested right heart strain (RV / LV = 1.2); her TTE was completely normal without any RV dysfunction (it is not uncommon for CT to overestimate the presence of right heart strain). Her sPESI class is I or II (depending on whether we consider her cecal wall thickening to represent a malignancy); both of which are considered low risk.  She is already on systemic heparin.  Despite her recently diagnosed bilateral PE's, Mrs. Gasser is essentially asymptomatic and is clinically stable.  She is  at low risk for post operative pulmonary  complications due to her underlying PE's.  If colonoscopy is deemed the most appropriate course of management (which I agree it is), then it can be certainly be performed with acceptable risk.  Recs: OK to proceed with colonoscopy - agree best to do during this admission. Continue with heparin gtt until after colonoscopy is complete and there is a plan on how to proceed given colonoscopy findings (surgery, etc). Nothing further from a pulmonary standpoint.  PCCM will sign off.  Please do not hesitate to call us back if we can be of any further assistance.   Montey Hora, [facility] Pager: [phone]  or [phone] 10/28/2014, 10:32 AM

## 2014-10-28 NOTE — Progress Notes (Signed)
[facility] PROGRESS NOTE  Krystal Campbell ELF:[id number] DOB: September 11, 1960 DOA: 10/25/2014  PCP: Krystal Kaufmann, MD  Brief HPI: 54 year old Caucasian female with a past medical history of obesity, hypertension, diabetes and recently diagnosed anemia for which she was transfused about a week ago. Presented with cough, shortness of breath and was found to have acute pulmonary embolism. She was also noted to have circumferential thickening of her cecum on CT scan. She was admitted to the hospital for further evaluation. She was actually transferred from [facility] in [facility].  Past medical history:  Past Medical History  Diagnosis Date  . Diabetes mellitus without complication   . Hypertension   . Hyperlipidemia   . Arthritis     in knees    Consultants: [facility]  Procedures:  EchocardiogramLV EF: 60% -  65%   - Left ventricle: The cavity size was normal. Wall thickness was normal. Systolic function was normal. The estimated ejection fraction was in the range of 60% to 65%. Wall motion was normal; there were no regional wall motion abnormalities. - Left atrium: The atrium was mildly dilated   Lower extremity venous Dopplers Negative for DVT bilat.  Superficial thrombosis of the right lesser saphenous vein. Left baker's cyst.  Antibiotics: None   Subjective: Patient denies any ongoing shortness of breath or chest pain, currently on room air and sitting comfortably   Objective: Vital Signs  Filed Vitals:   10/27/14 0500 10/27/14 1448 10/27/14 1953 10/28/14 0600  BP: 114/61 113/64 115/64 125/67  Pulse: 88 88 94 96  Temp: 97.9 F (36.6 C) 98.2 F (36.8 C) 98.9 F (37.2 C) 98.5 F (36.9 C)  TempSrc:  Oral Oral Oral  Resp: 19  18 18   Height:      Weight:    113.399 kg (250 lb)  SpO2: 98% 100% 98% 99%    Intake/Output Summary (Last 24 hours) at 10/28/14 1116 Last data filed at 10/28/14 0900  Gross per 24 hour  Intake     480 ml  Output   2600 ml  Net  -2120 ml   Filed Weights   10/25/14 1704 10/26/14 0400 10/28/14 0600  Weight: 116.937 kg (257 lb 12.8 oz) 114.216 kg (251 lb 12.8 oz) 113.399 kg (250 lb)    General appearance: alert, cooperative, appears stated age, no distress and morbidly obese Resp: Coarse breath sounds bilaterally with no wheezing, rales or rhonchi. Cardio: regular rate and rhythm, S1, S2 normal, no murmur, click, rub or gallop GI: Soft. Vague discomfort in the upper abdomen without any clear-cut rebound, rigidity or guarding. No masses or organomegaly. Bowel sounds present. Extremities: Nonpitting swelling noted in both lower extremities. Neurologic: No focal deficits  Lab Results:  Basic Metabolic Panel:  Recent Labs Lab 10/25/14 1831 10/26/14 0540 10/27/14 0409 10/28/14 0354  NA 133* 137 138 141  K 4.5 4.5 4.0 3.9  CL 99* 108 102 105  CO2 18* 24 25 27   GLUCOSE 358* 250* 177* 171*  BUN 11 10 13 9   CREATININE 1.20* 1.02* 1.11* 1.10*  CALCIUM 8.9 9.2 8.8* 9.1   Liver Function Tests:  Recent Labs Lab 10/25/14 1831 10/27/14 0409  AST 28 21  ALT 17 14  ALKPHOS 79 66  BILITOT 0.6 0.5  PROT 6.9 6.3*  ALBUMIN 3.1* 2.9*   CBC:  Recent Labs Lab 10/25/14 1831 10/26/14 0540 10/27/14 0409 10/28/14 0354  WBC 9.9 12.3* 10.2 7.8  HGB 9.0* 8.6* 8.8* 9.9*  HCT 30.0* 29.5* 30.0*  33.9*  MCV 77.1* 78.0 78.9 80.1  PLT 346 361 359 416*   Cardiac Enzymes:  Recent Labs Lab 10/25/14 1831 10/25/14 2328 10/26/14 0540  TROPONINI <0.03 <0.03 0.03   CBG:  Recent Labs Lab 10/27/14 0728 10/27/14 1158 10/27/14 1619 10/27/14 2054 10/28/14 0733  GLUCAP 130* 188* 163* 235* 143*    Recent Results (from the past 240 hour(s))  MRSA PCR Screening     Status: None   Collection Time: 10/25/14  5:08 PM  Result Value Ref Range Status   MRSA by PCR NEGATIVE NEGATIVE Final    Comment:        The GeneXpert MRSA Assay (FDA approved for NASAL specimens only), is one  component of a comprehensive MRSA colonization surveillance program. It is not intended to diagnose MRSA infection nor to guide or monitor treatment for MRSA infections.       Studies/Results: No results found.  Medications:  Scheduled: . antiseptic oral rinse  7 mL Mouth Rinse BID  . feeding supplement (GLUCERNA SHAKE)  237 mL Oral BID PC  . gabapentin  800 mg Oral TID  . insulin aspart  0-20 Units Subcutaneous TID WC  . insulin aspart  0-5 Units Subcutaneous QHS  . pantoprazole  40 mg Oral BID  . simvastatin  20 mg Oral q1800  . sodium chloride  3 mL Intravenous Q12H  . vitamin B-12  1,000 mcg Oral Daily   Continuous: . heparin 1,600 Units/hr (10/28/14 0612)   TZG:[id number] **OR** acetaminophen, HYDROcodone-acetaminophen, LORazepam, ondansetron **OR** ondansetron (ZOFRAN) IV  Assessment/Plan:  Principal Problem:   Acute pulmonary embolism Active Problems:   Diabetes   Hypertension   GERD (gastroesophageal reflux disease)   Pulmonary emboli   Cecal lesion   Anxiety state   Iron deficiency anemia    Acute pulmonary embolism with suggestion of right heart strain Patient's risk factors include her recent road trip to [place]. She was also hospitalized in the last couple of weeks. She also is noted to have circumferential thickening of her cecum and could have malignancy. Patient is currently hemodynamically stable. She was tachycardic on presentation. Heart rate is improved. Continue IV heparin for now. Venous Doppler study shows superficial thrombosis of right lesser saphenous vein. Left Baker's cyst noted. Echocardiogram results as above. Hold off on starting oral anticoagulation till plan regarding GI workup is clear.  Patient has been cleared by pulmonary to proceed with colonoscopy in the setting of acute PE PCCM will sign off   Thickening of the cecum noted on CT scan Concerning for malignancy in view of recently discovered microcytic anemia. She may need  endoscopic evaluation prior to being placed on anticoagulation by mouth. Gastroenterology to schedule patient for endoscopy/colonoscopy this week.  History of GERD Continue PPI. May need upper endoscopy as well.  History of diabetes mellitus type 2 without any complications Continue sliding scale coverage. Monitor CBGs.  History of essential hypertension Continue monitoring blood pressure.  Microcytic anemia secondary to iron deficiency. She was transfused about 2 weeks ago. Hemoglobin is stable. Her ferritin is 6. TIBC 528. Vitamin B-12 257. Continue B-12 supplementation   DVT Prophylaxis: On IV heparin.    Code Status: Full code  Family Communication: Discussed with the patient  Disposition Plan:  Awaiting endoscopy/colonoscopy  .     LOS: 3 days   [place] [facility] [facility] Pager [phone]  10/28/2014, 11:16 AM  If 7PM-7AM, please contact night-coverage at [email or web address], password [email or web address]

## 2014-10-28 NOTE — Progress Notes (Signed)
ANTICOAGULATION CONSULT NOTE - Follow up  Pharmacy Consult for heparin Indication: pulmonary embolus  No Known Allergies  Patient Measurements: Height: 5\' 4"  (162.6 cm) Weight: 250 lb (113.399 kg) IBW/kg (Calculated) : 54.7 Heparin Dosing Weight: 82.9kg  Vital Signs: Temp: 98.5 F (36.9 C) (08/03 0600) Temp Source: Oral (08/03 0600) BP: 125/67 mmHg (08/03 0600) Pulse Rate: 96 (08/03 0600)  Labs:  Recent Labs  10/25/14 1831 10/25/14 2328 10/26/14 0540  10/27/14 0409 10/28/14 0354 10/28/14 0500 10/28/14 1305  HGB 9.0*  --  8.6*  --  8.8* 9.9*  --   --   HCT 30.0*  --  29.5*  --  30.0* 33.9*  --   --   PLT 346  --  361  --  359 416*  --   --   HEPARINUNFRC  --   --   --   < > 0.43  --  0.18* 0.36  CREATININE 1.20*  --  1.02*  --  1.11* 1.10*  --   --   TROPONINI <0.03 <0.03 0.03  --   --   --   --   --   < > = values in this interval not displayed.  Estimated Creatinine Clearance: 72.2 mL/min (by C-G formula based on Cr of 1.1).   Medical History: Past Medical History  Diagnosis Date  . Diabetes mellitus without complication   . Hypertension   . Hyperlipidemia   . Arthritis     in knees   Assessment: 54 yo morbidly obese female currently on IV heparin infusion for new/acute PE./DVT. LUE venous duplex negative for DVT bilaterally; superficial thrombosis of the right lesser saphenous vein.  Heparin drip rate was increased this AM due to a subtherapeutic heparin level early this AM. Now the 6 hour heparin level is 0.36 on the increased rate of 1600 units/hr, thus currently therapeutic. H/H low/stable, pltc wnl/stable. No bleeding noted. Inadequate oral intake related to poor appetite, altered GI function. Patient has been cleared by pulmonary to proceed with colonoscopy in the setting of acute PE. GI consulted for microcytic anemia and notes that in light of CT findings, suspect cecal malignancy.     Goal of Therapy:  Heparin level 0.3-0.7 units/ml Monitor  platelets by anticoagulation protocol: Yes   Plan:  Continue heparin gtt at 1600 units/hr Check heparin level in 6 hrs to confirm remains therapeutic Daily heparin level and CBC F/u plans for colonoscopy and plan for oral anticoagulation  Nicole Cella, RPh Clinical Pharmacist Pager: [phone] 10/28/2014,2:13 PM

## 2014-10-29 LAB — COMPREHENSIVE METABOLIC PANEL
ALBUMIN: 2.8 g/dL — AB (ref 3.5–5.0)
ALT: 16 U/L (ref 14–54)
AST: 23 U/L (ref 15–41)
Alkaline Phosphatase: 71 U/L (ref 38–126)
Anion gap: 9 (ref 5–15)
BUN: 10 mg/dL (ref 6–20)
CHLORIDE: 105 mmol/L (ref 101–111)
CO2: 23 mmol/L (ref 22–32)
CREATININE: 0.99 mg/dL (ref 0.44–1.00)
Calcium: 8.7 mg/dL — ABNORMAL LOW (ref 8.9–10.3)
GFR calc Af Amer: >60 mL/min (ref >60)
GFR calc non Af Amer: >60 mL/min (ref >60)
Glucose, Bld: 294 mg/dL — ABNORMAL HIGH (ref 65–99)
POTASSIUM: 4.2 mmol/L (ref 3.5–5.1)
Sodium: 137 mmol/L (ref 135–145)
Total Bilirubin: 0.4 mg/dL (ref 0.3–1.2)
Total Protein: 6 g/dL — ABNORMAL LOW (ref 6.5–8.1)

## 2014-10-29 LAB — CBC
HCT: 33.7 % — ABNORMAL LOW (ref 36.0–46.0)
Hemoglobin: 9.8 g/dL — ABNORMAL LOW (ref 12.0–15.0)
MCH: 23.2 pg — AB (ref 26.0–34.0)
MCHC: 29.1 g/dL — ABNORMAL LOW (ref 30.0–36.0)
MCV: 79.7 fL (ref 78.0–100.0)
PLATELETS: 380 10*3/uL (ref 150–400)
RBC: 4.23 MIL/uL (ref 3.87–5.11)
RDW: 29.6 % — ABNORMAL HIGH (ref 11.5–15.5)
WBC: 8.3 10*3/uL (ref 4.0–10.5)

## 2014-10-29 LAB — HEPARIN LEVEL (UNFRACTIONATED)
HEPARIN UNFRACTIONATED: 0.37 [IU]/mL (ref 0.30–0.70)
Heparin Unfractionated: 0.42 IU/mL (ref 0.30–0.70)

## 2014-10-29 LAB — GLUCOSE, CAPILLARY
GLUCOSE-CAPILLARY: 178 mg/dL — AB (ref 65–99)
GLUCOSE-CAPILLARY: 183 mg/dL — AB (ref 65–99)
Glucose-Capillary: 184 mg/dL — ABNORMAL HIGH (ref 65–99)
Glucose-Capillary: 141 mg/dL — ABNORMAL HIGH (ref 65–99)

## 2014-10-29 MED ORDER — SODIUM CHLORIDE 0.9 % IV SOLN
INTRAVENOUS | Status: DC
  Administered 2014-10-30: 06:00:00 via INTRAVENOUS

## 2014-10-29 MED ORDER — PEG 3350-KCL-NA BICARB-NACL 420 G PO SOLR: 4000.0000 mL | Freq: Once | ORAL | Status: DC

## 2014-10-29 MED ORDER — BISACODYL 5 MG PO TBEC
10.0000 mg | DELAYED_RELEASE_TABLET | Freq: Once | ORAL | Status: AC
  Administered 2014-10-29: 10 mg via ORAL
  Filled 2014-10-29: qty 2

## 2014-10-29 MED ORDER — PEG 3350-KCL-NA BICARB-NACL 420 G PO SOLR
2000.0000 mL | Freq: Once | ORAL | Status: AC
  Administered 2014-10-30: 2000 mL via ORAL
  Filled 2014-10-29: qty 4000

## 2014-10-29 MED ORDER — PEG 3350-KCL-NA BICARB-NACL 420 G PO SOLR
2000.0000 mL | Freq: Once | ORAL | Status: AC
  Administered 2014-10-29: 2000 mL via ORAL

## 2014-10-29 NOTE — Progress Notes (Signed)
ANTICOAGULATION CONSULT NOTE - Follow Up Consult  Pharmacy Consult for heparin Indication: pulmonary embolus   Labs:  Recent Labs  10/26/14 0540  10/27/14 0409 10/28/14 0354 10/28/14 0500 10/28/14 1305 10/28/14 2300  HGB 8.6*  --  8.8* 9.9*  --   --   --   HCT 29.5*  --  30.0* 33.9*  --   --   --   PLT 361  --  359 416*  --   --   --   HEPARINUNFRC  --   < > 0.43  --  0.18* 0.36 0.42  CREATININE 1.02*  --  1.11* 1.10*  --   --   --   TROPONINI 0.03  --   --   --   --   --   --   < > = values in this interval not displayed.    Assessment/Plan:  54yo female remains therapeutic on heparin after rate change. Will continue gtt at current rate and confirm stable with am labs.   Wynona Neat, PharmD, BCPS  10/29/2014,12:10 AM

## 2014-10-29 NOTE — Progress Notes (Signed)
Subjective: Shortness of breath improving. Still with some lower abdominal discomfort, no appreciable change.  Objective: Vital signs in last 24 hours: Temp:  [98.2 F (36.8 C)-98.9 F (37.2 C)] 98.2 F (36.8 C) (08/04 0535) Pulse Rate:  [86-98] 86 (08/04 0535) Resp:  [18] 18 (08/04 0535) BP: (114-150)/(41-79) 114/41 mmHg (08/04 0535) SpO2:  [97 %-99 %] 97 % (08/04 0535) Weight:  [112.628 kg (248 lb 4.8 oz)] 112.628 kg (248 lb 4.8 oz) (08/04 0535) Weight change: -0.771 kg (-1 lb 11.2 oz) Last BM Date: 10/28/14  PE: GEN:  NAD, overweight ABD:  Protuberant, mild lower abdominal tenderness, active bowel sounds, no peritonitis.  Lab Results: CBC    Component Value Date/Time   WBC 8.3 10/29/2014 0310   RBC 4.23 10/29/2014 0310   RBC 3.60* 10/09/2014 2005   HGB 9.8* 10/29/2014 0310   HCT 33.7* 10/29/2014 0310   PLT 380 10/29/2014 0310   MCV 79.7 10/29/2014 0310   MCH 23.2* 10/29/2014 0310   MCHC 29.1* 10/29/2014 0310   RDW 29.6* 10/29/2014 0310   LYMPHSABS 1.6 10/09/2014 2005   MONOABS 0.5 10/09/2014 2005   EOSABS 0.3 10/09/2014 2005   BASOSABS 0.0 10/09/2014 2005   CMP     Component Value Date/Time   NA 137 10/29/2014 0310   K 4.2 10/29/2014 0310   CL 105 10/29/2014 0310   CO2 23 10/29/2014 0310   GLUCOSE 294* 10/29/2014 0310   BUN 10 10/29/2014 0310   CREATININE 0.99 10/29/2014 0310   CALCIUM 8.7* 10/29/2014 0310   PROT 6.0* 10/29/2014 0310   ALBUMIN 2.8* 10/29/2014 0310   AST 23 10/29/2014 0310   ALT 16 10/29/2014 0310   ALKPHOS 71 10/29/2014 0310   BILITOT 0.4 10/29/2014 0310   GFRNONAA >60 10/29/2014 0310   GFRAA >60 10/29/2014 0310   Assessment:   1. Microcytic anemia. In light of CT findings, suspect cecal malignancy. 2. Shortness of breath; multifactorial (anemia, pulmonary embolism). 3. Pulmonary embolism, perhaps incited by malignancy. 4. Weight loss, unintentional, suspicious for malignancy. 5. Abnormal CT, cecal thickening and adenopathy  worrisome for malignancy. 6. Abdominal pain, periumbilical and lower abdominal, possibly related to #5 above. 7. Early satiety. 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 Doubt plays role into her other constellation of symptoms.  Plan:  1.  Pulmonary medicine has cleared patient to undergo colonoscopy/endoscopy procedures. 2.  Clear liquid diet today. 3.  Split dosed bowel prep, half tonight and half in the morning. 4.  Colonoscopy (with endoscopy, if colonoscopy is negative, to follow) scheduled with propofol tomorrow at 12 noon.  Landry Dyke 10/29/2014, 12:53 PM   Pager [phone] If no answer or after 5 PM call [phone]

## 2014-10-29 NOTE — Progress Notes (Addendum)
[facility] PROGRESS NOTE  Krystal Campbell GBT:[id number] DOB: [birth year] DOA: 10/25/2014  PCP: Josem Kaufmann, MD  Brief HPI: 54 year old Caucasian female with a past medical history of obesity, hypertension, diabetes and recently diagnosed anemia for which she was transfused about a week ago. Presented with cough, shortness of breath and was found to have acute pulmonary embolism. She was also noted to have circumferential thickening of her cecum on CT scan. She was admitted to the hospital for further evaluation. She was actually transferred from [facility] in [facility]  Past medical history:  Past Medical History  Diagnosis Date  . Diabetes mellitus without complication   . Hypertension   . Hyperlipidemia   . Arthritis     in knees    Consultants: [facility]  Procedures:  EchocardiogramLV EF: 60% -  65%   - Left ventricle: The cavity size was normal. Wall thickness was normal. Systolic function was normal. The estimated ejection fraction was in the range of 60% to 65%. Wall motion was normal; there were no regional wall motion abnormalities. - Left atrium: The atrium was mildly dilated   Lower extremity venous Dopplers Negative for DVT bilat.  Superficial thrombosis of the right lesser saphenous vein. Left baker's cyst.  Antibiotics: None   Subjective: Patient would like to shower, denies any chest pain shortness of breath dizziness  Objective: Vital Signs  Filed Vitals:   10/28/14 0600 10/28/14 1452 10/28/14 2122 10/29/14 0535  BP: 125/67 122/70 150/79 114/41  Pulse: 96 95 98 86  Temp: 98.5 F (36.9 C) 98.8 F (37.1 C) 98.9 F (37.2 C) 98.2 F (36.8 C)  TempSrc: Oral Oral Oral Oral  Resp: 18  18 18   Height:      Weight: 113.399 kg (250 lb)   112.628 kg (248 lb 4.8 oz)  SpO2: 99% 98% 99% 97%    Intake/Output Summary (Last 24 hours) at 10/29/14 1108 Last data filed at 10/29/14 0913  Gross per 24 hour  Intake     720 ml  Output   1100 ml  Net   -380 ml   Filed Weights   10/26/14 0400 10/28/14 0600 10/29/14 0535  Weight: 114.216 kg (251 lb 12.8 oz) 113.399 kg (250 lb) 112.628 kg (248 lb 4.8 oz)    General appearance: alert, cooperative, appears stated age, no distress and morbidly obese Resp: Coarse breath sounds bilaterally with no wheezing, rales or rhonchi. Cardio: regular rate and rhythm, S1, S2 normal, no murmur, click, rub or gallop GI: Soft. Vague discomfort in the upper abdomen without any clear-cut rebound, rigidity or guarding. No masses or organomegaly. Bowel sounds present. Extremities: Nonpitting swelling noted in both lower extremities. Neurologic: No focal deficits  Lab Results:  Basic Metabolic Panel:  Recent Labs Lab 10/25/14 1831 10/26/14 0540 10/27/14 0409 10/28/14 0354 10/29/14 0310  NA 133* 137 138 141 137  K 4.5 4.5 4.0 3.9 4.2  CL 99* 108 102 105 105  CO2 18* 24 25 27 23   GLUCOSE 358* 250* 177* 171* 294*  BUN 11 10 13 9 10   CREATININE 1.20* 1.02* 1.11* 1.10* 0.99  CALCIUM 8.9 9.2 8.8* 9.1 8.7*   Liver Function Tests:  Recent Labs Lab 10/25/14 1831 10/27/14 0409 10/29/14 0310  AST 28 21 23   ALT 17 14 16   ALKPHOS 79 66 71  BILITOT 0.6 0.5 0.4  PROT 6.9 6.3* 6.0*  ALBUMIN 3.1* 2.9* 2.8*   CBC:  Recent Labs Lab 10/25/14 1831 10/26/14 0540 10/27/14 0409  10/28/14 0354 10/29/14 0310  WBC 9.9 12.3* 10.2 7.8 8.3  HGB 9.0* 8.6* 8.8* 9.9* 9.8*  HCT 30.0* 29.5* 30.0* 33.9* 33.7*  MCV 77.1* 78.0 78.9 80.1 79.7  PLT 346 361 359 416* 380   Cardiac Enzymes:  Recent Labs Lab 10/25/14 1831 10/25/14 2328 10/26/14 0540  TROPONINI <0.03 <0.03 0.03   CBG:  Recent Labs Lab 10/28/14 0733 10/28/14 1122 10/28/14 1614 10/28/14 2121 10/29/14 0714  GLUCAP 143* 138* 176* 188* 178*    Recent Results (from the past 240 hour(s))  MRSA PCR Screening     Status: None   Collection Time: 10/25/14  5:08 PM  Result Value Ref Range Status   MRSA by PCR  NEGATIVE NEGATIVE Final    Comment:        The GeneXpert MRSA Assay (FDA approved for NASAL specimens only), is one component of a comprehensive MRSA colonization surveillance program. It is not intended to diagnose MRSA infection nor to guide or monitor treatment for MRSA infections.       Studies/Results: No results found.  Medications:  Scheduled: . antiseptic oral rinse  7 mL Mouth Rinse BID  . bisacodyl  10 mg Oral Once  . feeding supplement (GLUCERNA SHAKE)  237 mL Oral BID PC  . gabapentin  800 mg Oral TID  . insulin aspart  0-20 Units Subcutaneous TID WC  . insulin aspart  0-5 Units Subcutaneous QHS  . pantoprazole  40 mg Oral BID  . polyethylene glycol-electrolytes  2,000 mL Oral Once   And  . [START ON 10/30/2014] polyethylene glycol-electrolytes  2,000 mL Oral Once  . simvastatin  20 mg Oral q1800  . sodium chloride  3 mL Intravenous Q12H  . vitamin B-12  1,000 mcg Oral Daily   Continuous: . heparin 1,600 Units/hr (10/29/14 0100)   MPN:[id number] **OR** acetaminophen, HYDROcodone-acetaminophen, LORazepam, ondansetron **OR** ondansetron (ZOFRAN) IV  Assessment/Plan:  Principal Problem:   Acute pulmonary embolism Active Problems:   Diabetes   Hypertension   GERD (gastroesophageal reflux disease)   Pulmonary emboli   Cecal lesion   Anxiety state   Iron deficiency anemia    Acute pulmonary embolism with suggestion of right heart strain Patient's risk factors include her recent road trip to [place]. She was also hospitalized in the last couple of weeks. She also is noted to have circumferential thickening of her cecum and could have malignancy. Patient is currently hemodynamically stable. She was tachycardic on presentation. Heart rate is improved. Continue IV heparin for now. Venous Doppler study shows superficial thrombosis of right lesser saphenous vein. Left Baker's cyst noted. Echocardiogram results as above. Hold off on starting oral anticoagulation  till plan regarding GI workup is clear.  Patient has been cleared by pulmonary to proceed with colonoscopy in the setting of acute PE PCCM has  signed off   Thickening of the cecum noted on CT scan Concerning for malignancy in view of recently discovered microcytic anemia. Anticipated to have  endoscopic evaluation tomorrow. Gastroenterology to schedule patient for endoscopy/colonoscopy in a.m.  History of GERD Continue PPI. May need upper endoscopy as well.  History of diabetes mellitus type 2 without any complications Continue sliding scale coverage. Monitor CBGs.  History of essential hypertension Continue monitoring blood pressure.  Microcytic anemia secondary to iron deficiency. She was transfused about 2 weeks ago. Hemoglobin is stable. Her ferritin is 6. TIBC 528. Vitamin B-12 257. Continue B-12 supplementation   DVT Prophylaxis: On IV heparin.    Code Status: Full code  Family Communication: Discussed with the patient  Disposition Plan:  Awaiting endoscopy/colonoscopy  .     LOS: 4 days   [facility] Pager [phone]  10/29/2014, 11:08 AM  If 7PM-7AM, please contact night-coverage at [email or web address], password [facility]

## 2014-10-29 NOTE — Progress Notes (Signed)
Utilization review completed.  

## 2014-10-30 ENCOUNTER — Encounter (HOSPITAL_COMMUNITY)
Admission: AD | Disposition: A | Discharge status: Home / Self Care | Payer: Self-pay | Source: Other Acute Inpatient Hospital | Attending: Internal Medicine

## 2014-10-30 ENCOUNTER — Inpatient Hospital Stay (HOSPITAL_COMMUNITY): Payer: 59 | Admitting: Certified Registered"

## 2014-10-30 ENCOUNTER — Inpatient Hospital Stay (HOSPITAL_COMMUNITY): Payer: 59

## 2014-10-30 ENCOUNTER — Encounter (HOSPITAL_COMMUNITY): Payer: Self-pay

## 2014-10-30 DIAGNOSIS — I2699 Other pulmonary embolism without acute cor pulmonale: Secondary | ICD-10-CM

## 2014-10-30 HISTORY — PX: ESOPHAGOGASTRODUODENOSCOPY (EGD) WITH PROPOFOL: SHX5813

## 2014-10-30 HISTORY — PX: COLONOSCOPY WITH PROPOFOL: SHX5780

## 2014-10-30 LAB — CBC
HEMATOCRIT: 34.7 % — AB (ref 36.0–46.0)
Hemoglobin: 10.2 g/dL — ABNORMAL LOW (ref 12.0–15.0)
MCH: 23.4 pg — ABNORMAL LOW (ref 26.0–34.0)
MCHC: 29.4 g/dL — AB (ref 30.0–36.0)
MCV: 79.6 fL (ref 78.0–100.0)
Platelets: 396 10*3/uL (ref 150–400)
RBC: 4.36 MIL/uL (ref 3.87–5.11)
RDW: 29.5 % — ABNORMAL HIGH (ref 11.5–15.5)
WBC: 8.9 10*3/uL (ref 4.0–10.5)

## 2014-10-30 LAB — HEPARIN LEVEL (UNFRACTIONATED)
HEPARIN UNFRACTIONATED: 0.27 [IU]/mL — AB (ref 0.30–0.70)
Heparin Unfractionated: 0.43 IU/mL (ref 0.30–0.70)

## 2014-10-30 LAB — GLUCOSE, CAPILLARY
GLUCOSE-CAPILLARY: 146 mg/dL — AB (ref 65–99)
Glucose-Capillary: 158 mg/dL — ABNORMAL HIGH (ref 65–99)
Glucose-Capillary: 136 mg/dL — ABNORMAL HIGH (ref 65–99)
Glucose-Capillary: 153 mg/dL — ABNORMAL HIGH (ref 65–99)
Glucose-Capillary: 244 mg/dL — ABNORMAL HIGH (ref 65–99)

## 2014-10-30 SURGERY — COLONOSCOPY WITH PROPOFOL
Anesthesia: Monitor Anesthesia Care | Laterality: Left

## 2014-10-30 MED ORDER — HEPARIN (PORCINE) IN NACL 100-0.45 UNIT/ML-% IJ SOLN
1750.0000 [IU]/h | INTRAMUSCULAR | Status: DC
  Administered 2014-10-30 (×2): 1600 [IU]/h via INTRAVENOUS
  Administered 2014-10-31 – 2014-11-02 (×3): 1750 [IU]/h via INTRAVENOUS
  Filled 2014-10-30 (×6): qty 250

## 2014-10-30 MED ORDER — PROPOFOL INFUSION 10 MG/ML OPTIME
INTRAVENOUS | Status: DC | PRN
  Administered 2014-10-30: 150 ug/kg/min via INTRAVENOUS

## 2014-10-30 MED ORDER — LACTATED RINGERS IV SOLN
INTRAVENOUS | Status: DC | PRN
  Administered 2014-10-30: 12:00:00 via INTRAVENOUS

## 2014-10-30 MED ORDER — APIXABAN 5 MG PO TABS: ORAL_TABLET | ORAL | Status: DC

## 2014-10-30 MED ORDER — BUTAMBEN-TETRACAINE-BENZOCAINE 2-2-14 % EX AERO
INHALATION_SPRAY | CUTANEOUS | Status: DC | PRN
  Administered 2014-10-30: 2 via TOPICAL

## 2014-10-30 MED ORDER — PROPOFOL 10 MG/ML IV BOLUS
INTRAVENOUS | Status: DC | PRN
  Administered 2014-10-30 (×3): 10 mg via INTRAVENOUS

## 2014-10-30 NOTE — Op Note (Signed)
[facility] [place], [address]   ENDOSCOPY PROCEDURE REPORT  PATIENT: Krystal Campbell, Krystal Campbell  MR#: [id number] BIRTHDATE: [birth year] , 89  yrs. old GENDER: female ENDOSCOPIST:Dosha Broshears Teachey, MD REFERRED BY:   Pat Kocher, MD PROCEDURE DATE:  2014/11/13 PROCEDURE:   Upper endoscopy ASA CLASS:    III INDICATIONS: anemia, recent pulmonary embolism requiring anticoagulation, anorexia, early satiety MEDICATION: propofol, per anesthesia TOPICAL ANESTHETIC:   unknown  DESCRIPTION OF PROCEDURE:   The patient was brought from her hospital room to the [facility] cone endoscopy unit. After the risks and benefits of the procedure were explained to the patient and her mother at the bedside, informed consent was obtained.  The Pentax Gastroscope [id number]  endoscope was introduced through the mouth and advanced to the second portion of the duodenum .  The instrument was slowly withdrawn as the mucosa was fully examined. Estimated blood loss is zero unless otherwise noted in this procedure report.    The larynx looked normal.  The esophagus was entered under direct vision without difficulty. The esophagus was normal to the squamocolumnar junction, where there was a widely patent Schatzki's ring. Below this was a 6 cm hiatal hernia.  The abdominal portion of the stomach was entered. There was some bile coating the gastric mucosa, but no gastric residual. There was no evidence of gastritis, erosions, ulcers, polyps, or masses. A retroflexed view of the proximal stomach showed a widely patulous diaphragmatic hiatus, with the hiatal hernia visible from its inferior perspective.  Retroflexion was negative for any tumors or obvious Cameron erosions.  The pylorus, duodenal bulb, and second duodenum looked normal.  No biopsies were obtained.  The scope was then withdrawn from the patient and the procedure completed.  COMPLICATIONS: There were no immediate  complications.  ENDOSCOPIC IMPRESSION: 1. Moderate-sized hiatal hernia with patulous diaphragmatic hiatus, which would put the patient at moderate risk for esophageal reflux. 2. Bile reflux, uncertain clinical significance to the patient's symptoms of early satiety, but both could be the result of gastric dysmotility, especially in a diabetic patient like this 3. No source of anemia evident on current examination   RECOMMENDATIONS: 1. Monitor for reflux symptomatology and treat as needed 2. Monitor for nausea and consider trial of sucralfate suspension for possible bile reflux gastritis 3. Proceed to colonoscopic evaluation for further evaluation of the anemia, especially in view of the apparent presence of a cecal mass seen on her CT scan   _______________________________ eSigned:  Ronald Lobo, MD November 13, 2014 12:22 PM     cc:  CPT CODES: ICD CODES:  The ICD and CPT codes recommended by this software are interpretations from the data that the clinical staff has captured with the software.  The verification of the translation of this report to the ICD and CPT codes and modifiers is the sole responsibility of the health care institution and practicing physician where this report was generated.  [employer] will not be held responsible for the validity of the ICD and CPT codes included on this report.  AMA assumes no liability for data contained or not contained herein. CPT is a Designer, television/film set of the [employer].  PATIENT NAME:  Krystal Campbell, Krystal Campbell MR#: [id number]

## 2014-10-30 NOTE — Anesthesia Postprocedure Evaluation (Signed)
  Anesthesia Post-op Note  Patient: Krystal Campbell  Procedure(s) Performed: Procedure(s): COLONOSCOPY WITH PROPOFOL (Left) ESOPHAGOGASTRODUODENOSCOPY (EGD) WITH PROPOFOL (Left)  Patient Location: PACU  Anesthesia Type:MAC  Level of Consciousness: awake  Airway and Oxygen Therapy: Patient Spontanous Breathing  Post-op Pain: mild  Post-op Assessment: Post-op Vital signs reviewed              Post-op Vital Signs: Reviewed  Last Vitals:  Filed Vitals:   10/30/14 1051  BP: 139/91  Pulse: 90  Temp: 36.8 C  Resp: 14    Complications: No apparent anesthesia complications

## 2014-10-30 NOTE — Discharge Summary (Deleted)
Physician  Progress note     Char Feltman MRN: [id number] DOB/AGE: 1960-08-24 54 y.o.  PCP: Josem Kaufmann, MD   Admit date: 10/25/2014 Discharge date: 10/30/2014  Discharge Diagnoses:     Principal Problem:   Acute pulmonary embolism Active Problems:   Diabetes   Hypertension   GERD (gastroesophageal reflux disease)   Pulmonary emboli   Cecal lesion   Anxiety state   Iron deficiency anemia  status post EGD/colonoscopy Cecal mass       HPI :*54 y.o. female  With PMhx of HTN, DM, obesity.  Sent to [facility] around July 15th by her PCP after Hgb was found to be low. She has fatigue, SOB, and pica. PCP wanted a GI evaluation (UGD and colonoscopy). Patient was transfused and sent for an outpatient GI. She continued to feel poorly. Her daughter was in a car accident and patient drove 10 hours with a friend to check on her. She had a cough during that time and SOB. When patient returned, she saw her PCP who gave her azithromycin with no improvement.  Today, she presented to [facility] with continued SOB, fatigue.  At [facility], she was found to have a PE on CTA with acute heart strain. She also received a CT Scan of abd as her hgb was low at [facility] and was found to have cecal wall thickening with right mesenteric lymph node-- gastroenterology recommended EGD/colonoscopy   HOSPITAL COURSE:  Acute pulmonary embolism with suggestion of right heart strain Patient's risk factors include her recent road trip to [place]. She was also hospitalized in the last couple of weeks. She also is noted to have circumferential thickening of her cecum and could have malignancy. Patient is currently hemodynamically stable. She was tachycardic on presentation. Heart rate is improved. Initiated on IV heparin for now. Venous Doppler study shows superficial thrombosis of right lesser saphenous vein. Left Baker's cyst noted. Echocardiogram results as above. We will switch patient to oral  anticoagulation with coumadin once cleared by general surgery and pulmonary .  She will also need a 5 day overlap with subcutaneous Lovenox until her INR is greater than 2.0   She will need anticoagulation for 3-6 months or as determined by heme-onc  Patient was evaluated by pulmonology and cleared to undergo GI procedure in the setting of her acute PE. Will need clearance from pulmonary if the patient were to undergo major operative procedure     Thickening of the cecum noted on CT scan Concerning for malignancy in view of recently discovered microcytic anemia. PATIENT IS STATUS POST EGD  AND COLONOSCOPY,    IMPRESSION: 1. Cecal mass, consistent with a malignant neoplasm, correlating to that seen on the CT scan 2. Left-sided diverticulosis..  Gastroenterology recommends to restart heparin drip in 2-3 hours, surgical consultation will be obtained 4 cecal mass. Further imaging would be recommended by general surgery Discussed with  pulmonary about the risk  Of undergoing abdominal surgery in the setting of recent PE, pulmonary also recommends Coumadin as treatment of choice for patient's PE  , due to concern about the propensity of bleeding of the cecal mass.    History of GERD Continue PPI. EGD today  History of diabetes mellitus type 2 without any complications Initiated on SSI, resume metformin at discharge  History of essential hypertension Continue monitoring blood pressure.  Microcytic anemia secondary to iron deficiency. She was transfused about 2 weeks ago. Hemoglobin is stable. Her ferritin is 6. TIBC 528. Vitamin B-12 257. Continue  B-12 supplementation    Consults: Gastroenterology Pulmonary General  surgery    Significant Diagnostic Studies:  Dg Chest Port 1 View  10/25/2014   CLINICAL DATA:  Shortness of breath, PE  EXAM: PORTABLE CHEST - 1 VIEW  COMPARISON:  None.  FINDINGS: Lungs are clear.  No pleural effusion or pneumothorax.  The heart is normal in size.   IMPRESSION: No evidence of acute cardiopulmonary disease.   Electronically Signed   By: Julian Hy M.D.   On: 10/25/2014 18:20     echocardiogram EF 60-65% Left ventricle: The cavity size was normal. Wall thickness was normal. Systolic function was normal. The estimated ejection fraction was in the range of 60% to 65%. Wall motion was normal; there were no regional wall motion abnormalities. - Left atrium: The atrium was mildly dilated.    Filed Weights   10/28/14 0600 10/29/14 0535 10/30/14 0447  Weight: 113.399 kg (250 lb) 112.628 kg (248 lb 4.8 oz) 112.129 kg (247 lb 3.2 oz)     Microbiology: Recent Results (from the past 240 hour(s))  MRSA PCR Screening     Status: None   Collection Time: 10/25/14  5:08 PM  Result Value Ref Range Status   MRSA by PCR NEGATIVE NEGATIVE Final    Comment:        The GeneXpert MRSA Assay (FDA approved for NASAL specimens only), is one component of a comprehensive MRSA colonization surveillance program. It is not intended to diagnose MRSA infection nor to guide or monitor treatment for MRSA infections.        Blood Culture No results found for: SDES, [place], CULT, REPTSTATUS    Labs: Results for orders placed or performed during the hospital encounter of 10/25/14 (from the past 48 hour(s))  Glucose, capillary     Status: Abnormal   Collection Time: 10/28/14 11:22 AM  Result Value Ref Range   Glucose-Capillary 138 (H) 65 - 99 mg/dL   Comment 1 Notify RN    Comment 2 Document in Chart   Heparin level (unfractionated)     Status: None   Collection Time: 10/28/14  1:05 PM  Result Value Ref Range   Heparin Unfractionated 0.36 0.30 - 0.70 IU/mL    Comment:        IF HEPARIN RESULTS ARE BELOW EXPECTED VALUES, AND PATIENT DOSAGE HAS BEEN CONFIRMED, SUGGEST FOLLOW UP TESTING OF ANTITHROMBIN III LEVELS.   Glucose, capillary     Status: Abnormal   Collection Time: 10/28/14  4:14 PM  Result Value Ref Range    Glucose-Capillary 176 (H) 65 - 99 mg/dL  Glucose, capillary     Status: Abnormal   Collection Time: 10/28/14  9:21 PM  Result Value Ref Range   Glucose-Capillary 188 (H) 65 - 99 mg/dL   Comment 1 Notify RN   Heparin level (unfractionated)     Status: None   Collection Time: 10/28/14 11:00 PM  Result Value Ref Range   Heparin Unfractionated 0.42 0.30 - 0.70 IU/mL    Comment:        IF HEPARIN RESULTS ARE BELOW EXPECTED VALUES, AND PATIENT DOSAGE HAS BEEN CONFIRMED, SUGGEST FOLLOW UP TESTING OF ANTITHROMBIN III LEVELS.   CBC     Status: Abnormal   Collection Time: 10/29/14  3:10 AM  Result Value Ref Range   WBC 8.3 4.0 - 10.5 K/uL   RBC 4.23 3.87 - 5.11 MIL/uL   Hemoglobin 9.8 (L) 12.0 - 15.0 g/dL   HCT 33.7 (L) 36.0 - 46.0 %  MCV 79.7 78.0 - 100.0 fL   MCH 23.2 (L) 26.0 - 34.0 pg   MCHC 29.1 (L) 30.0 - 36.0 g/dL   RDW 29.6 (H) 11.5 - 15.5 %   Platelets 380 150 - 400 K/uL  Comprehensive metabolic panel     Status: Abnormal   Collection Time: 10/29/14  3:10 AM  Result Value Ref Range   Sodium 137 135 - 145 mmol/L   Potassium 4.2 3.5 - 5.1 mmol/L   Chloride 105 101 - 111 mmol/L   CO2 23 22 - 32 mmol/L   Glucose, Bld 294 (H) 65 - 99 mg/dL   BUN 10 6 - 20 mg/dL   Creatinine, Ser 0.99 0.44 - 1.00 mg/dL   Calcium 8.7 (L) 8.9 - 10.3 mg/dL   Total Protein 6.0 (L) 6.5 - 8.1 g/dL   Albumin 2.8 (L) 3.5 - 5.0 g/dL   AST 23 15 - 41 U/L   ALT 16 14 - 54 U/L   Alkaline Phosphatase 71 38 - 126 U/L   Total Bilirubin 0.4 0.3 - 1.2 mg/dL   GFR calc non Af Amer >60 >60 mL/min   GFR calc Af Amer >60 >60 mL/min    Comment: (NOTE) The eGFR has been calculated using the CKD EPI equation. This calculation has not been validated in all clinical situations. eGFR's persistently <60 mL/min signify possible Chronic Kidney Disease.    Anion gap 9 5 - 15  Heparin level (unfractionated)     Status: None   Collection Time: 10/29/14  5:00 AM  Result Value Ref Range   Heparin Unfractionated 0.37  0.30 - 0.70 IU/mL    Comment:        IF HEPARIN RESULTS ARE BELOW EXPECTED VALUES, AND PATIENT DOSAGE HAS BEEN CONFIRMED, SUGGEST FOLLOW UP TESTING OF ANTITHROMBIN III LEVELS.   Glucose, capillary     Status: Abnormal   Collection Time: 10/29/14  7:14 AM  Result Value Ref Range   Glucose-Capillary 178 (H) 65 - 99 mg/dL  Glucose, capillary     Status: Abnormal   Collection Time: 10/29/14 11:37 AM  Result Value Ref Range   Glucose-Capillary 184 (H) 65 - 99 mg/dL  Glucose, capillary     Status: Abnormal   Collection Time: 10/29/14  4:07 PM  Result Value Ref Range   Glucose-Capillary 183 (H) 65 - 99 mg/dL  Glucose, capillary     Status: Abnormal   Collection Time: 10/29/14 10:01 PM  Result Value Ref Range   Glucose-Capillary 141 (H) 65 - 99 mg/dL  Heparin level (unfractionated)     Status: None   Collection Time: 10/30/14  4:30 AM  Result Value Ref Range   Heparin Unfractionated 0.43 0.30 - 0.70 IU/mL    Comment:        IF HEPARIN RESULTS ARE BELOW EXPECTED VALUES, AND PATIENT DOSAGE HAS BEEN CONFIRMED, SUGGEST FOLLOW UP TESTING OF ANTITHROMBIN III LEVELS.   CBC     Status: Abnormal   Collection Time: 10/30/14  4:30 AM  Result Value Ref Range   WBC 8.9 4.0 - 10.5 K/uL   RBC 4.36 3.87 - 5.11 MIL/uL   Hemoglobin 10.2 (L) 12.0 - 15.0 g/dL   HCT 34.7 (L) 36.0 - 46.0 %   MCV 79.6 78.0 - 100.0 fL   MCH 23.4 (L) 26.0 - 34.0 pg   MCHC 29.4 (L) 30.0 - 36.0 g/dL   RDW 29.5 (H) 11.5 - 15.5 %   Platelets 396 150 - 400 K/uL  Glucose,  capillary     Status: Abnormal   Collection Time: 10/30/14  7:23 AM  Result Value Ref Range   Glucose-Capillary 158 (H) 65 - 99 mg/dL     Lipid Panel  No results found for: CHOL, TRIG, HDL, CHOLHDL, VLDL, LDLCALC, LDLDIRECT   No results found for: HGBA1C   Lab Results  Component Value Date   CREATININE 0.99 10/29/2014       Discharge Exam:    Blood pressure 123/67, pulse 94, temperature 98.3 F (36.8 C), temperature source Oral, resp.  rate 18, height $RemoveBe'5\' 4"'[id number]$  (1.626 m), weight 112.129 kg (247 lb 3.2 oz), SpO2 100 %. General appearance: alert, cooperative, appears stated age, no distress and morbidly obese Resp: Coarse breath sounds bilaterally with no wheezing, rales or rhonchi. Cardio: regular rate and rhythm, S1, S2 normal, no murmur, click, rub or gallop GI: Soft. Vague discomfort in the upper abdomen without any clear-cut rebound, rigidity or guarding. No masses or organomegaly. Bowel sounds present. Extremities: Nonpitting swelling noted in both lower extremities. Neurologic: No focal deficits        Discharge Instructions    Diet - low sodium heart healthy    Complete by:  As directed      Increase activity slowly    Complete by:  As directed              Signed: Shermika Balthaser 10/30/2014, 10:39 AM        Time spent >45 mins

## 2014-10-30 NOTE — Progress Notes (Signed)
Referral for Eliquis- noted by pharmacy that plan is for Lovenox to coumadin- will defer this referral at this time. NCM to continue to follow if needed.

## 2014-10-30 NOTE — Progress Notes (Addendum)
PT Cancellation Note  Patient Details Name: Krystal Campbell MRN: [id number] DOB: Dec 11, 1960   Cancelled Treatment:    Reason Eval/Treat Not Completed: Patient at procedure or test/unavailable (pt at endo).  [name] 10/30/2014, 11:07 AM

## 2014-10-30 NOTE — Consult Note (Signed)
[facility] Surgery Consult Note  Krystal Campbell 09-26-1960  [id number].    Requesting MD: Dr. Veatrice Kells Chief Complaint/Reason for Consult: Cecal mass  HPI:  54 y/o obese white female with PMH DM, HTN, HLD, arthritis.  She was seen by her PCP and found to have Hgb 7.0 on 10/09/14.  [facility] secondary to SOB, fatigue, and excessive thirst. She was transfused 2 units pRBC's, discharged, and instructed that she needed to see GI as an outpatient for an EGD and colonoscopy. She was planning on seeing GI after a few weeks, but was unable to find someone who would take her insurance.   A week later her daughter who lives in [place] was in a MVC so she drove to [place] and back overnight to check on her. Shortly prior to the trip she had worsening cough and SOB, but these worsened after her trip. She saw her PCP after she got back, and was put on a z-pack.  Despite the antibiotic her symptoms persisted and she went to [facility] on 10/25/14 for further evaluation.  At [facility], CT of the chest revealed bilateral PE with right heart strain (RV / LV of 1.2). She also had CT of the abd / pelv which showed cecal wall thickening with right mesenteric lymph node.  CT's in PAX system from [place].  She was started on heparin and was sent to [facility] for further workup. At [facility], she was seen by Dr. Paulita Fujita of GI. PCCM was called and cleared her to proceed with colonoscopy/EGD.  Dr. Cristina Gong performed these today which showed non-obstructing/non-bleeding cecal mass at the ileocecal valve and left sided diverticulosis.  EGD showed moderate sized hiatal hernia, but no acute cause of bleeding.  We were consulted regarding possible surgery for her cecal mass.  She denies N/V, abdominal pain or distension.  Having normal flatus and BM, no anorexia.    ROS: All systems reviewed and otherwise negative except for as above  Family History  Problem Relation Age of Onset  . Healthy Mother     no  clotting issues in either parents  . Healthy Father     Past Medical History  Diagnosis Date  . Diabetes mellitus without complication   . Hypertension   . Hyperlipidemia   . Arthritis     in knees    Past Surgical History  Procedure Laterality Date  . Replacement total knee    . Abdominal hysterectomy    . Kidney surgery      when patient was a teenager  . Joint replacement  4/15    Total right knee replacement; left knee scoped    Social History:  reports that she has quit smoking. She does not have any smokeless tobacco history on file. She reports that she drinks alcohol. Her drug history is not on file.  Allergies: No Known Allergies  Medications Prior to Admission  Medication Sig Dispense Refill  . azithromycin (ZITHROMAX) 250 MG tablet Take 250 mg by mouth daily.    . ferrous sulfate 325 (65 FE) MG tablet Take 1 tablet (325 mg total) by mouth 2 (two) times daily with a meal. 60 tablet 3  . gabapentin (NEURONTIN) 800 MG tablet Take 800 mg by mouth 3 (three) times daily.    Marland Kitchen LORazepam (ATIVAN) 1 MG tablet Take 0.5 mg by mouth 3 (three) times daily as needed for anxiety.     . Melatonin 3 MG CAPS Take 6 mg by mouth at  bedtime as needed (for sleep).     . metFORMIN (GLUCOPHAGE) 1000 MG tablet Take 1,000 mg by mouth 2 (two) times daily.    . pantoprazole (PROTONIX) 40 MG tablet Take 1 tablet (40 mg total) by mouth 2 (two) times daily. 60 tablet 1  . simvastatin (ZOCOR) 20 MG tablet Take 20 mg by mouth at bedtime.       Blood pressure 104/46, pulse 99, temperature 98.2 F (36.8 C), temperature source Oral, resp. rate 18, height 5' 4" (1.626 m), weight 112.129 kg (247 lb 3.2 oz), SpO2 99 %. Physical Exam: General: Obese, pleasant, WD/WN white female who is laying in bed in NAD HEENT: head is normocephalic, atraumatic.  Sclera are noninjected.  PERRL.  Ears and nose without any masses or lesions.  Mouth is pink and moist. Heart: regular, rate, and rhythm.  No obvious  murmurs, gallops, or rubs noted.  Palpable pedal pulses bilaterally Lungs: CTAB, no wheezes, rhonchi, or rales noted.  Respiratory effort non-labored, great effort Abd: soft, NT/ND, +BS, no masses, hernias, or organomegaly, large right flank scar noted from back to RLQ MS: all 4 extremities are symmetrical with no cyanosis, clubbing, or edema. Skin: warm and dry with no masses, lesions, or rashes Psych: A&Ox3 with an appropriate affect.   Results for orders placed or performed during the hospital encounter of 10/25/14 (from the past 48 hour(s))  Glucose, capillary     Status: Abnormal   Collection Time: 10/28/14  4:14 PM  Result Value Ref Range   Glucose-Capillary 176 (H) 65 - 99 mg/dL  Glucose, capillary     Status: Abnormal   Collection Time: 10/28/14  9:21 PM  Result Value Ref Range   Glucose-Capillary 188 (H) 65 - 99 mg/dL   Comment 1 Notify RN   Heparin level (unfractionated)     Status: None   Collection Time: 10/28/14 11:00 PM  Result Value Ref Range   Heparin Unfractionated 0.42 0.30 - 0.70 IU/mL    Comment:        IF HEPARIN RESULTS ARE BELOW EXPECTED VALUES, AND PATIENT DOSAGE HAS BEEN CONFIRMED, SUGGEST FOLLOW UP TESTING OF ANTITHROMBIN III LEVELS.   CBC     Status: Abnormal   Collection Time: 10/29/14  3:10 AM  Result Value Ref Range   WBC 8.3 4.0 - 10.5 K/uL   RBC 4.23 3.87 - 5.11 MIL/uL   Hemoglobin 9.8 (L) 12.0 - 15.0 g/dL   HCT 33.7 (L) 36.0 - 46.0 %   MCV 79.7 78.0 - 100.0 fL   MCH 23.2 (L) 26.0 - 34.0 pg   MCHC 29.1 (L) 30.0 - 36.0 g/dL   RDW 29.6 (H) 11.5 - 15.5 %   Platelets 380 150 - 400 K/uL  Comprehensive metabolic panel     Status: Abnormal   Collection Time: 10/29/14  3:10 AM  Result Value Ref Range   Sodium 137 135 - 145 mmol/L   Potassium 4.2 3.5 - 5.1 mmol/L   Chloride 105 101 - 111 mmol/L   CO2 23 22 - 32 mmol/L   Glucose, Bld 294 (H) 65 - 99 mg/dL   BUN 10 6 - 20 mg/dL   Creatinine, Ser 0.99 0.44 - 1.00 mg/dL   Calcium 8.7 (L) 8.9 -  10.3 mg/dL   Total Protein 6.0 (L) 6.5 - 8.1 g/dL   Albumin 2.8 (L) 3.5 - 5.0 g/dL   AST 23 15 - 41 U/L   ALT 16 14 - 54 U/L   Alkaline Phosphatase  71 38 - 126 U/L   Total Bilirubin 0.4 0.3 - 1.2 mg/dL   GFR calc non Af Amer >60 >60 mL/min   GFR calc Af Amer >60 >60 mL/min    Comment: (NOTE) The eGFR has been calculated using the CKD EPI equation. This calculation has not been validated in all clinical situations. eGFR's persistently <60 mL/min signify possible Chronic Kidney Disease.    Anion gap 9 5 - 15  Heparin level (unfractionated)     Status: None   Collection Time: 10/29/14  5:00 AM  Result Value Ref Range   Heparin Unfractionated 0.37 0.30 - 0.70 IU/mL    Comment:        IF HEPARIN RESULTS ARE BELOW EXPECTED VALUES, AND PATIENT DOSAGE HAS BEEN CONFIRMED, SUGGEST FOLLOW UP TESTING OF ANTITHROMBIN III LEVELS.   Glucose, capillary     Status: Abnormal   Collection Time: 10/29/14  7:14 AM  Result Value Ref Range   Glucose-Capillary 178 (H) 65 - 99 mg/dL  Glucose, capillary     Status: Abnormal   Collection Time: 10/29/14 11:37 AM  Result Value Ref Range   Glucose-Capillary 184 (H) 65 - 99 mg/dL  Glucose, capillary     Status: Abnormal   Collection Time: 10/29/14  4:07 PM  Result Value Ref Range   Glucose-Capillary 183 (H) 65 - 99 mg/dL  Glucose, capillary     Status: Abnormal   Collection Time: 10/29/14 10:01 PM  Result Value Ref Range   Glucose-Capillary 141 (H) 65 - 99 mg/dL  Heparin level (unfractionated)     Status: None   Collection Time: 10/30/14  4:30 AM  Result Value Ref Range   Heparin Unfractionated 0.43 0.30 - 0.70 IU/mL    Comment:        IF HEPARIN RESULTS ARE BELOW EXPECTED VALUES, AND PATIENT DOSAGE HAS BEEN CONFIRMED, SUGGEST FOLLOW UP TESTING OF ANTITHROMBIN III LEVELS.   CBC     Status: Abnormal   Collection Time: 10/30/14  4:30 AM  Result Value Ref Range   WBC 8.9 4.0 - 10.5 K/uL   RBC 4.36 3.87 - 5.11 MIL/uL   Hemoglobin 10.2 (L)  12.0 - 15.0 g/dL   HCT 34.7 (L) 36.0 - 46.0 %   MCV 79.6 78.0 - 100.0 fL   MCH 23.4 (L) 26.0 - 34.0 pg   MCHC 29.4 (L) 30.0 - 36.0 g/dL   RDW 29.5 (H) 11.5 - 15.5 %   Platelets 396 150 - 400 K/uL  Glucose, capillary     Status: Abnormal   Collection Time: 10/30/14  7:23 AM  Result Value Ref Range   Glucose-Capillary 158 (H) 65 - 99 mg/dL  Glucose, capillary     Status: Abnormal   Collection Time: 10/30/14 10:58 AM  Result Value Ref Range   Glucose-Capillary 136 (H) 65 - 99 mg/dL  Glucose, capillary     Status: Abnormal   Collection Time: 10/30/14  1:04 PM  Result Value Ref Range   Glucose-Capillary 146 (H) 65 - 99 mg/dL   No results found.    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 Lake Bells and Dr. Milinda Hirschfeld to discuss and give further recommendations -May need IVC filter -May need to plan as OP surgical intervention if not safe to proceed with surgery now -If we send her home with anticoagulation she will likely re-bleed.  Her  Hgb is currently stable at 10.2 and no active source of bleeding identified on CSP or EGD.   -Okay to continue clears right now, would not advance further because otherwise we would have to re-prep her. -Pending colonoscopy biopsies Pulmonary embolism ?hypercoaguable state from cancer vs long car ride -Hgb now up to 10.2 On heparin, Elliquis on hold right now Microcytic anemia secondary to iron deficiency/ABL anemia -s/p 2 units pRBC at [facility] 2 weeks ago DM/HLD/HTN/Obesity BMI 42 Left sided diverticulosis H/o hysterectomy, h/o tubal ligation, h/o kidney surgery (moderate right renal atrophy)  Mother and father of patient at bedside.  The patient understands the predicament we are in and until we are able to get Pulmonology's recommendations would keep her inpatient.   Nat Christen,  [facility] Surgery 10/30/2014, 3:19 PM Pager: [phone]

## 2014-10-30 NOTE — Transfer of Care (Signed)
Immediate Anesthesia Transfer of Care Note  Patient: Krystal Campbell  Procedure(s) Performed: Procedure(s): COLONOSCOPY WITH PROPOFOL (Left) ESOPHAGOGASTRODUODENOSCOPY (EGD) WITH PROPOFOL (Left)  Patient Location: PACU  Anesthesia Type:MAC  Level of Consciousness: awake, alert  and oriented  Airway & Oxygen Therapy: Patient connected to nasal cannula oxygen  Post-op Assessment: Report given to RN  Post vital signs: stable  Last Vitals:  Filed Vitals:   10/30/14 1051  BP: 139/91  Pulse: 90  Temp: 36.8 C  Resp: 14    Complications: No apparent anesthesia complications

## 2014-10-30 NOTE — Progress Notes (Signed)
Cecal mass confirmed on colonoscopy. Patient would like to see surgeon today, rather than going home for outpatient surgical consultation, if possible.  Okay to resume anticoagulation at any time.  Please see dictated endoscopy and colonoscopy notes for further details and recommendations.  Cleotis Nipper, M.D. Pager [phone] If no answer or after 5 PM call [phone]

## 2014-10-30 NOTE — Progress Notes (Addendum)
ANTICOAGULATION CONSULT NOTE - Follow Up Consult  Pharmacy Consult for Heparin  Indication: pulmonary embolus  No Known Allergies  Patient Measurements: Height: 5\' 4"  (162.6 cm) Weight: 247 lb 3.2 oz (112.129 kg) IBW/kg (Calculated) : 54.7  Vital Signs: Temp: 100.4 F (38 C) (08/05 2045) Temp Source: Oral (08/05 2045) BP: 103/38 mmHg (08/05 2045) Pulse Rate: 96 (08/05 2045)  Labs:  Recent Labs  10/28/14 0354  10/29/14 0310 10/29/14 0500 10/30/14 0430 10/30/14 2210  HGB 9.9*  --  9.8*  --  10.2*  --   HCT 33.9*  --  33.7*  --  34.7*  --   PLT 416*  --  380  --  396  --   HEPARINUNFRC  --   < >  --  0.37 0.43 0.27*  CREATININE 1.10*  --  0.99  --   --   --   < > = values in this interval not displayed.  Estimated Creatinine Clearance: 79.7 mL/min (by C-G formula based on Cr of 0.99).   Assessment: Sub-therapeutic heparin level, no issues per RN.   Goal of Therapy:  Heparin level 0.3-0.7 units/ml Monitor platelets by anticoagulation protocol: Yes   Plan:  -Increase heparin to 1750 units/hr -0700 HL -Daily CBC/HL -Monitor for bleeding  Narda Bonds 10/30/2014,11:24 PM  ======================== HL this AM is 0.42, drawn slightly early -Continue heparin at 1750 units/hr -1000 HL to confirm Sharlynn Oliphant, PharmD

## 2014-10-30 NOTE — Op Note (Signed)
[facility] [place], [address]   COLONOSCOPY PROCEDURE REPORT  PATIENT: Krystal Campbell, Krystal Campbell  MR#: [id number] BIRTHDATE: Jul 30, 1960 , 47  yrs. old GENDER: female ENDOSCOPIST: Ronald Lobo, MD REFERRED BY:  Dr. Pat Kocher PROCEDURE DATE:  2014-11-11 PROCEDURE:   colonoscopy with biopsies ASA CLASS:   III INDICATIONS:  anemia, recent pulmonary embolism, and cecal mass on recent CT scan MEDICATIONS: propofol, per anesthesia  DESCRIPTION OF PROCEDURE:   The patient was brought from her hospital room to the [facility] cone endoscopy unit. This procedure was performed immediately following her upper endoscopy. After the risks and benefits and of the procedure were explained to the patient and her mother at the bedside, informed consent had been obtained.  The patient had undergone a split prep.         The [employer] Adult Colon [id number]  endoscope was introduced through the anus and advanced to the cecum, as confirmed by visualization of the ileocecal valve      .  The quality of the prep was excellent .  The instrument was then slowly withdrawn as the colon was fully examined. Estimated blood loss is zero unless otherwise noted in this procedure report.   The quality of the prep was excellent and it is felt that all areas were well seen.       the scope was advanced easily around the colon to the cecum.  The cecum was occupied by a large, lobulated, deeply ulcerated mass with mucosal erosion. It appeared to be involving the ileal cecal valve. Several biopsies were obtained from this, with mild oozing (the patient's heparin had been stopped more than 12 hours earlier).  The patient also had fairly extensive left-sided diverticulosis.  This was otherwise normal exam, without any synchronous masses being identified, nor any polyps, colitis, or vascular ectasia.  Retroflexion in the rectum was normal.          The scope was then withdrawn from the  patient and the procedure completed.  WITHDRAWAL TIME: 8 minutes  COMPLICATIONS: There were no immediate complications.  ENDOSCOPIC IMPRESSION: 1. Cecal mass, consistent with a malignant neoplasm, correlating to that seen on the CT scan 2. Left-sided diverticulosis.   RECOMMENDATIONS: 1. Await pathology results on the biopsies of the cecal mass 2. Okay to restart heparin in a couple of hours (I have ordered) 3. Recommend surgical consultation; with this patient's recent pulmonary embolism, the timing of surgery may need to be coordinated with medicine consultants. 4. Okay to advance diet as desired. However, for now, I have placed the patient on a clear liquid diet, in the event that surgery wants to proceed with resection of the lesion in the next day or so, in which case the patient would not require a repeat prep  REPEAT EXAM: one year, if clinically appropriate based on the patient's overall medical status at that time  cc:  _______________________________ eSigned:  Ronald Lobo, MD 11-11-2014 12:30 PM   CPT CODES: ICD CODES:  The ICD and CPT codes recommended by this software are interpretations from the data that the clinical staff has captured with the software.  The verification of the translation of this report to the ICD and CPT codes and modifiers is the sole responsibility of the health care institution and practicing physician where this report was generated.  [employer] will not be held responsible for the validity of the ICD and CPT codes included on this report.  AMA assumes  no liability for data contained or not contained herein. CPT is a Designer, television/film set of the [employer].   PATIENT NAME:  Maisyn, Nouri MR#: [id number]

## 2014-10-30 NOTE — Progress Notes (Signed)
By the way, as an addendum to my prior notes, I don't think there will be any further role for me in this patient's care, so I will plan to sign off at this time. However, don't hesitate to call me if questions arise or if I can be of other assistance.  Cleotis Nipper, M.D. Pager [phone] If no answer or after 5 PM call [phone]

## 2014-10-30 NOTE — Progress Notes (Signed)
Bilateral upper extremity venous duplex completed:  No obvious evidence of DVT or superficial thrombosis.  Technically difficult study due to the patient's body habitus.

## 2014-10-30 NOTE — Anesthesia Preprocedure Evaluation (Addendum)
Anesthesia Evaluation  Patient identified by MRN, date of birth, ID band Patient awake    Reviewed: Allergy & Precautions, NPO status , Patient's Chart, lab work & pertinent test results  Airway Mallampati: II  TM Distance: >3 FB Neck ROM: Full    Dental   Pulmonary former smoker,  breath sounds clear to auscultation        Cardiovascular hypertension, Rhythm:Regular Rate:Normal     Neuro/Psych    GI/Hepatic Neg liver ROS, GERD-  ,  Endo/Other  diabetes  Renal/GU negative Renal ROS     Musculoskeletal   Abdominal   Peds  Hematology   Anesthesia Other Findings   Reproductive/Obstetrics                           Anesthesia Physical Anesthesia Plan  ASA: III  Anesthesia Plan: MAC   Post-op Pain Management:    Induction: Intravenous  Airway Management Planned: LMA  Additional Equipment:   Intra-op Plan:   Post-operative Plan:   Informed Consent:   Plan Discussed with:   Anesthesia Plan Comments:         Anesthesia Quick Evaluation

## 2014-10-30 NOTE — Progress Notes (Signed)
PT Cancellation Note  Patient Details Name: Kelby Lotspeich MRN: [id number] DOB: 1960/12/13   Cancelled Treatment:    Reason Eval/Treat Not Completed: PT screened, no needs identified, will sign off. Per nursing tech pt has been showering independently and has been mobilizing independently.   Yaiza Palazzola 10/30/2014, 3:28 PM  [facility] PT [phone]

## 2014-10-30 NOTE — Progress Notes (Signed)
Physician  Progress note     Krystal Campbell MRN: [id number] DOB/AGE: 1960-08-24 54 y.o.  PCP: Josem Kaufmann, MD   Admit date: 10/25/2014 Discharge date: 10/30/2014  Discharge Diagnoses:     Principal Problem:   Acute pulmonary embolism Active Problems:   Diabetes   Hypertension   GERD (gastroesophageal reflux disease)   Pulmonary emboli   Cecal lesion   Anxiety state   Iron deficiency anemia  status post EGD/colonoscopy Cecal mass       HPI :*54 y.o. female  With PMhx of HTN, DM, obesity.  Sent to [facility] around July 15th by her PCP after Hgb was found to be low. She has fatigue, SOB, and pica. PCP wanted a GI evaluation (UGD and colonoscopy). Patient was transfused and sent for an outpatient GI. She continued to feel poorly. Her daughter was in a car accident and patient drove 10 hours with a friend to check on her. She had a cough during that time and SOB. When patient returned, she saw her PCP who gave her azithromycin with no improvement.  Today, she presented to [facility] with continued SOB, fatigue.  At [facility], she was found to have a PE on CTA with acute heart strain. She also received a CT Scan of abd as her hgb was low at [facility] and was found to have cecal wall thickening with right mesenteric lymph node-- gastroenterology recommended EGD/colonoscopy   HOSPITAL COURSE:  Acute pulmonary embolism with suggestion of right heart strain Patient's risk factors include her recent road trip to [place]. She was also hospitalized in the last couple of weeks. She also is noted to have circumferential thickening of her cecum and could have malignancy. Patient is currently hemodynamically stable. She was tachycardic on presentation. Heart rate is improved. Initiated on IV heparin for now. Venous Doppler study shows superficial thrombosis of right lesser saphenous vein. Left Baker's cyst noted. Echocardiogram results as above. We will switch patient to oral  anticoagulation with coumadin once cleared by general surgery and pulmonary .  She will also need a 5 day overlap with subcutaneous Lovenox until her INR is greater than 2.0   She will need anticoagulation for 3-6 months or as determined by heme-onc  Patient was evaluated by pulmonology and cleared to undergo GI procedure in the setting of her acute PE. Will need clearance from pulmonary if the patient were to undergo major operative procedure     Thickening of the cecum noted on CT scan Concerning for malignancy in view of recently discovered microcytic anemia. PATIENT IS STATUS POST EGD  AND COLONOSCOPY,    IMPRESSION: 1. Cecal mass, consistent with a malignant neoplasm, correlating to that seen on the CT scan 2. Left-sided diverticulosis..  Gastroenterology recommends to restart heparin drip in 2-3 hours, surgical consultation will be obtained 4 cecal mass. Further imaging would be recommended by general surgery Discussed with  pulmonary about the risk  Of undergoing abdominal surgery in the setting of recent PE, pulmonary also recommends Coumadin as treatment of choice for patient's PE  , due to concern about the propensity of bleeding of the cecal mass.    History of GERD Continue PPI. EGD today  History of diabetes mellitus type 2 without any complications Initiated on SSI, resume metformin at discharge  History of essential hypertension Continue monitoring blood pressure.  Microcytic anemia secondary to iron deficiency. She was transfused about 2 weeks ago. Hemoglobin is stable. Her ferritin is 6. TIBC 528. Vitamin B-12 257. Continue  B-12 supplementation    Consults: Gastroenterology Pulmonary General  surgery    Significant Diagnostic Studies:  Dg Chest Port 1 View  10/25/2014   CLINICAL DATA:  Shortness of breath, PE  EXAM: PORTABLE CHEST - 1 VIEW  COMPARISON:  None.  FINDINGS: Lungs are clear.  No pleural effusion or pneumothorax.  The heart is normal in size.   IMPRESSION: No evidence of acute cardiopulmonary disease.   Electronically Signed   By: Julian Hy M.D.   On: 10/25/2014 18:20     echocardiogram EF 60-65% Left ventricle: The cavity size was normal. Wall thickness was normal. Systolic function was normal. The estimated ejection fraction was in the range of 60% to 65%. Wall motion was normal; there were no regional wall motion abnormalities. - Left atrium: The atrium was mildly dilated.    Filed Weights   10/28/14 0600 10/29/14 0535 10/30/14 0447  Weight: 113.399 kg (250 lb) 112.628 kg (248 lb 4.8 oz) 112.129 kg (247 lb 3.2 oz)     Microbiology: Recent Results (from the past 240 hour(s))  MRSA PCR Screening     Status: None   Collection Time: 10/25/14  5:08 PM  Result Value Ref Range Status   MRSA by PCR NEGATIVE NEGATIVE Final    Comment:        The GeneXpert MRSA Assay (FDA approved for NASAL specimens only), is one component of a comprehensive MRSA colonization surveillance program. It is not intended to diagnose MRSA infection nor to guide or monitor treatment for MRSA infections.        Blood Culture No results found for: SDES, [place], CULT, REPTSTATUS    Labs: Results for orders placed or performed during the hospital encounter of 10/25/14 (from the past 48 hour(s))  Glucose, capillary     Status: Abnormal   Collection Time: 10/28/14  9:21 PM  Result Value Ref Range   Glucose-Capillary 188 (H) 65 - 99 mg/dL   Comment 1 Notify RN   Heparin level (unfractionated)     Status: None   Collection Time: 10/28/14 11:00 PM  Result Value Ref Range   Heparin Unfractionated 0.42 0.30 - 0.70 IU/mL    Comment:        IF HEPARIN RESULTS ARE BELOW EXPECTED VALUES, AND PATIENT DOSAGE HAS BEEN CONFIRMED, SUGGEST FOLLOW UP TESTING OF ANTITHROMBIN III LEVELS.   CBC     Status: Abnormal   Collection Time: 10/29/14  3:10 AM  Result Value Ref Range   WBC 8.3 4.0 - 10.5 K/uL   RBC 4.23 3.87 - 5.11  MIL/uL   Hemoglobin 9.8 (L) 12.0 - 15.0 g/dL   HCT 33.7 (L) 36.0 - 46.0 %   MCV 79.7 78.0 - 100.0 fL   MCH 23.2 (L) 26.0 - 34.0 pg   MCHC 29.1 (L) 30.0 - 36.0 g/dL   RDW 29.6 (H) 11.5 - 15.5 %   Platelets 380 150 - 400 K/uL  Comprehensive metabolic panel     Status: Abnormal   Collection Time: 10/29/14  3:10 AM  Result Value Ref Range   Sodium 137 135 - 145 mmol/L   Potassium 4.2 3.5 - 5.1 mmol/L   Chloride 105 101 - 111 mmol/L   CO2 23 22 - 32 mmol/L   Glucose, Bld 294 (H) 65 - 99 mg/dL   BUN 10 6 - 20 mg/dL   Creatinine, Ser 0.99 0.44 - 1.00 mg/dL   Calcium 8.7 (L) 8.9 - 10.3 mg/dL   Total Protein 6.0 (L)  6.5 - 8.1 g/dL   Albumin 2.8 (L) 3.5 - 5.0 g/dL   AST 23 15 - 41 U/L   ALT 16 14 - 54 U/L   Alkaline Phosphatase 71 38 - 126 U/L   Total Bilirubin 0.4 0.3 - 1.2 mg/dL   GFR calc non Af Amer >60 >60 mL/min   GFR calc Af Amer >60 >60 mL/min    Comment: (NOTE) The eGFR has been calculated using the CKD EPI equation. This calculation has not been validated in all clinical situations. eGFR's persistently <60 mL/min signify possible Chronic Kidney Disease.    Anion gap 9 5 - 15  Heparin level (unfractionated)     Status: None   Collection Time: 10/29/14  5:00 AM  Result Value Ref Range   Heparin Unfractionated 0.37 0.30 - 0.70 IU/mL    Comment:        IF HEPARIN RESULTS ARE BELOW EXPECTED VALUES, AND PATIENT DOSAGE HAS BEEN CONFIRMED, SUGGEST FOLLOW UP TESTING OF ANTITHROMBIN III LEVELS.   Glucose, capillary     Status: Abnormal   Collection Time: 10/29/14  7:14 AM  Result Value Ref Range   Glucose-Capillary 178 (H) 65 - 99 mg/dL  Glucose, capillary     Status: Abnormal   Collection Time: 10/29/14 11:37 AM  Result Value Ref Range   Glucose-Capillary 184 (H) 65 - 99 mg/dL  Glucose, capillary     Status: Abnormal   Collection Time: 10/29/14  4:07 PM  Result Value Ref Range   Glucose-Capillary 183 (H) 65 - 99 mg/dL  Glucose, capillary     Status: Abnormal    Collection Time: 10/29/14 10:01 PM  Result Value Ref Range   Glucose-Capillary 141 (H) 65 - 99 mg/dL  Heparin level (unfractionated)     Status: None   Collection Time: 10/30/14  4:30 AM  Result Value Ref Range   Heparin Unfractionated 0.43 0.30 - 0.70 IU/mL    Comment:        IF HEPARIN RESULTS ARE BELOW EXPECTED VALUES, AND PATIENT DOSAGE HAS BEEN CONFIRMED, SUGGEST FOLLOW UP TESTING OF ANTITHROMBIN III LEVELS.   CBC     Status: Abnormal   Collection Time: 10/30/14  4:30 AM  Result Value Ref Range   WBC 8.9 4.0 - 10.5 K/uL   RBC 4.36 3.87 - 5.11 MIL/uL   Hemoglobin 10.2 (L) 12.0 - 15.0 g/dL   HCT 34.7 (L) 36.0 - 46.0 %   MCV 79.6 78.0 - 100.0 fL   MCH 23.4 (L) 26.0 - 34.0 pg   MCHC 29.4 (L) 30.0 - 36.0 g/dL   RDW 29.5 (H) 11.5 - 15.5 %   Platelets 396 150 - 400 K/uL  Glucose, capillary     Status: Abnormal   Collection Time: 10/30/14  7:23 AM  Result Value Ref Range   Glucose-Capillary 158 (H) 65 - 99 mg/dL  Glucose, capillary     Status: Abnormal   Collection Time: 10/30/14 10:58 AM  Result Value Ref Range   Glucose-Capillary 136 (H) 65 - 99 mg/dL  Glucose, capillary     Status: Abnormal   Collection Time: 10/30/14  1:04 PM  Result Value Ref Range   Glucose-Capillary 146 (H) 65 - 99 mg/dL  Glucose, capillary     Status: Abnormal   Collection Time: 10/30/14  4:29 PM  Result Value Ref Range   Glucose-Capillary 153 (H) 65 - 99 mg/dL     Lipid Panel  No results found for: CHOL, TRIG, HDL, CHOLHDL, VLDL, LDLCALC,  LDLDIRECT   No results found for: HGBA1C   Lab Results  Component Value Date   CREATININE 0.99 10/29/2014       Discharge Exam:    Blood pressure 104/46, pulse 99, temperature 98.2 F (36.8 C), temperature source Oral, resp. rate 18, height _0  (1.626 m), weight 112.129 kg (247 lb 3.2 oz), SpO2 99 %. General appearance: alert, cooperative, appears stated age, no distress and morbidly obese Resp: Coarse breath sounds bilaterally with no wheezing,  rales or rhonchi. Cardio: regular rate and rhythm, S1, S2 normal, no murmur, click, rub or gallop GI: Soft. Vague discomfort in the upper abdomen without any clear-cut rebound, rigidity or guarding. No masses or organomegaly. Bowel sounds present. Extremities: Nonpitting swelling noted in both lower extremities. Neurologic: No focal deficits    Discharge Instructions    Diet - low sodium heart healthy    Complete by:  As directed      Increase activity slowly    Complete by:  As directed            Follow-up Information    Follow up with [facility], MD. Schedule an appointment as soon as possible for a visit in 3 days.   Specialty:  Family Medicine   Contact information:   [place] [id number] [phone]       Signed: Reyne Dumas 10/30/2014, 5:47 PM        Time spent >45 mins

## 2014-10-30 NOTE — Progress Notes (Signed)
ANTICOAGULATION CONSULT NOTE - Follow up  Pharmacy Consult for heparin Indication: pulmonary embolus  No Known Allergies  Patient Measurements: Height: 5\' 4"  (162.6 cm) Weight: 247 lb 3.2 oz (112.129 kg) IBW/kg (Calculated) : 54.7 Heparin Dosing Weight: 82.9kg  Assessment: 54 yo morbidly obese female currently on IV heparin infusion for new/acute PE./DVT. LUE venous duplex negative for DVT bilaterally; superficial thrombosis of the right lesser saphenous vein. I spoke with Dr. Allyson Sabal, will resume Heparin 2 hours after her procedure.  She was therapeutic at 0.43 on IV heparin rate of 1600 units/hr.   H/H low/stable, pltc wnl/stable. She is s/p colonoscopy and mass found.  She will be evaluated by surgeon for possible surgery.  The plan will be to send her home on Lovenox and Warfarin if she is not to have surgery soon.   Goal of Therapy:  Heparin level 0.3-0.7 units/ml Monitor platelets by anticoagulation protocol: Yes   Plan:  Restart IV heparin gtt at 1600 units/hr Check heparin level in 6 hrs  Daily heparin level and CBC Start LMWH and Warfarin if surgery will be postponed.  Rober Minion, PharmD., MS Clinical Pharmacist Pager:  [phone] Thank you for allowing pharmacy to be part of this patients care team. 10/30/2014,1:02 PM

## 2014-10-30 NOTE — Progress Notes (Signed)
   Name: Krystal Campbell MRN: [id number] DOB: July 03, 1960    ADMISSION DATE:  10/25/2014 CONSULTATION DATE:  10/28/14  REFERRING MD :  Paulita Fujita  CHIEF COMPLAINT:  Bilateral PE  BRIEF PATIENT DESCRIPTION:   SIGNIFICANT EVENTS  7/31 - Presented to OSH & found to have PE 8/05 - Colonoscopy w/ mass  STUDIES:  CT Chest 7/31 >>> Bilat PE w/ evidence of RHS (RV/LV = 1.2) CT A/P 7/31 >>> cecal wall thickening with mesenteric LAD LE Duplex 8/1 >> Negative for DVT. TTE 8/2 >>> RV normal. No RWMA's. EF 60-65%.  SUBJECTIVE:  Patient denies any dyspnea or cough. She denies any chest pain or pressure. She denies any syncopal events prior to admission or since admission. Patient underwent lower endoscopy today which showed a colon mass which was biopsied.  ROS: No nausea, vomiting, or diarrhea. No headache or vision changes.  VITAL SIGNS: Temp:  [98.2 F (36.8 C)-98.5 F (36.9 C)] 98.2 F (36.8 C) (08/05 1051) Pulse Rate:  [90-106] 99 (08/05 1311) Resp:  [14-19] 18 (08/05 1311) BP: (90-139)/(46-91) 104/46 mmHg (08/05 1311) SpO2:  [96 %-100 %] 99 % (08/05 1311) Weight:  [247 lb 3.2 oz (112.129 kg)] 247 lb 3.2 oz (112.129 kg) (08/05 0447)  PHYSICAL EXAMINATION: General:  No distress. Sitting up in bed. Parents at bedside. Neuro:  Grossly nonfocal. No meningismus. Moving all 4 extremities equally. HEENT:  MMM. No scleral icterus. No oral ulcers. Cardiovascular:  Regular rate. Regular rhythm. No edema. Lungs:  CTAB. Normal WOB on room air. Speaking in complete sentences. Abdomen:  Soft. ND. NT. Normal bowel sounds. Musculoskeletal:  Normal bulk and tone. No joint deformities. Skin:  Warm & dry. No rash.   Recent Labs Lab 10/27/14 0409 10/28/14 0354 10/29/14 0310  NA 138 141 137  K 4.0 3.9 4.2  CL 102 105 105  CO2 25 27 23   BUN 13 9 10   CREATININE 1.11* 1.10* 0.99  GLUCOSE 177* 171* 294*    Recent Labs Lab 10/28/14 0354 10/29/14 0310 10/30/14 0430  HGB 9.9* 9.8* 10.2*  HCT 33.9*  33.7* 34.7*  WBC 7.8 8.3 8.9  PLT 416* 380 396   No results found.  ASSESSMENT / PLAN: 54 year old female with diagnosed bilateral pulmonary emboli. No evidence for cor pulmonale or RV dysfunction on echocardiogram. Likely patient does have hypercoagulable state in the setting of probable underlying malignancy. Discussed the case with the surgical team and it's reasonable to proceed with surgery in this patient's clinical setting recognizing the risks for repeat embolization. This risk was discussed with the patient. I have ordered bilateral upper extremity Doppler ultrasounds to rule out the possibility of malignancy as the patient did previously have bilateral lower extremities that were ultrasound negative for evidence of DVT. This does not completely rule out the risk of a pelvic venous DVT and one could consider placement of an IVC filter versus pelvic vein imaging depending upon the level of concern for residual clot and duration of withholding systemic anticoagulation around the time of her surgery. Please contact us if we can be of any further assistance in the care of this patient.  Sonia Baller Ashok Cordia, M.D. [facility] Pager:  [phone] After 3pm or if no response, call [phone]  10/30/2014, 4:02 PM

## 2014-10-31 LAB — CBC
HCT: 32.5 % — ABNORMAL LOW (ref 36.0–46.0)
Hemoglobin: 9.9 g/dL — ABNORMAL LOW (ref 12.0–15.0)
MCH: 24.3 pg — ABNORMAL LOW (ref 26.0–34.0)
MCHC: 30.5 g/dL (ref 30.0–36.0)
MCV: 79.7 fL (ref 78.0–100.0)
Platelets: 360 10*3/uL (ref 150–400)
RBC: 4.08 MIL/uL (ref 3.87–5.11)
RDW: 29.1 % — ABNORMAL HIGH (ref 11.5–15.5)
WBC: 7.7 10*3/uL (ref 4.0–10.5)

## 2014-10-31 LAB — GLUCOSE, CAPILLARY
Glucose-Capillary: 190 mg/dL — ABNORMAL HIGH (ref 65–99)
Glucose-Capillary: 278 mg/dL — ABNORMAL HIGH (ref 65–99)
Glucose-Capillary: 286 mg/dL — ABNORMAL HIGH (ref 65–99)
Glucose-Capillary: 221 mg/dL — ABNORMAL HIGH (ref 65–99)

## 2014-10-31 LAB — HEPARIN LEVEL (UNFRACTIONATED)
HEPARIN UNFRACTIONATED: 0.42 [IU]/mL (ref 0.30–0.70)
Heparin Unfractionated: 0.57 IU/mL (ref 0.30–0.70)

## 2014-10-31 NOTE — Progress Notes (Signed)
GS Progress Note Subjective: No issues overnight, hungry for real food  Objective: Vital signs in last 24 hours: Temp:  [98 F (36.7 C)-100.4 F (38 C)] 98 F (36.7 C) (08/06 0610) Pulse Rate:  [93-106] 94 (08/06 0610) Resp:  [15-19] 18 (08/06 0610) BP: (90-135)/(38-79) 127/62 mmHg (08/06 0610) SpO2:  [97 %-100 %] 99 % (08/06 0610) Weight:  [110.95 kg (244 lb 9.6 oz)] 110.95 kg (244 lb 9.6 oz) (08/06 0610) Last BM Date: 10/29/14  Intake/Output from previous day: 08/05 0701 - 08/06 0700 In: 200 [I.V.:200] Out: -  Intake/Output this shift: Total I/O In: 3 [I.V.:3] Out: -   Lungs: CTAB  Abd: soft, NT, ND  Extremities: no edema  Neuro: GCS 15  Lab Results: CBC   Recent Labs  10/30/14 0430 10/31/14 0310  WBC 8.9 7.7  HGB 10.2* 9.9*  HCT 34.7* 32.5*  PLT 396 360   BMET  Recent Labs  10/29/14 0310  NA 137  K 4.2  CL 105  CO2 23  GLUCOSE 294*  BUN 10  CREATININE 0.99  CALCIUM 8.7*   PT/INR No results for input(s): LABPROT, INR in the last 72 hours. ABG No results for input(s): PHART, HCO3 in the last 72 hours.  Invalid input(s): PCO2, PO2  Studies/Results: No results found.  Anti-infectives: Anti-infectives    None      Assessment/Plan: s/p Procedure(s): COLONOSCOPY WITH PROPOFOL ESOPHAGOGASTRODUODENOSCOPY (EGD) WITH PROPOFOL Continue clear liquids today with plan for OR early in week Continue heparin drip per IM/pharm Will follow up pathology  LOS: 6 days

## 2014-10-31 NOTE — Progress Notes (Addendum)
Physician  Progress note     Krystal Campbell MRN: [id number] DOB/AGE: [birth year] 54 y.o.  PCP: Josem Kaufmann, MD   Admit date: 10/25/2014 Discharge date: Pending  Active diagnoses    Principal Problem:   Acute pulmonary embolism Active Problems:   Diabetes   Hypertension   GERD (gastroesophageal reflux disease)   Pulmonary emboli   Cecal lesion   Anxiety state   Iron deficiency anemia  status post EGD/colonoscopy Cecal mass       HPI :*54 y.o. female  With PMhx of HTN, DM, obesity.  Sent to [facility] around July 15th by her PCP after Hgb was found to be low. She has fatigue, SOB, and pica. PCP wanted a GI evaluation (UGD and colonoscopy). Patient was transfused and sent for an outpatient GI. She continued to feel poorly. Her daughter was in a car accident and patient drove 10 hours with a friend to check on her. She had a cough during that time and SOB. When patient returned, she saw her PCP who gave her azithromycin with no improvement.  Today, she presented to [facility] with continued SOB, fatigue.  At [facility], she was found to have a PE on CTA with acute heart strain. She also received a CT Scan of abd as her hgb was low at [employer] and was found to have cecal wall thickening with right mesenteric lymph node-- gastroenterology recommended EGD/colonoscopy   HOSPITAL COURSE:  Acute pulmonary embolism without suggestion of right heart strain Patient's risk factors include her recent road trip to [place]. In the setting of malignancy. Patient is currently hemodynamically stable. She was tachycardic on presentation. Heart rate is improved. Currently on a IV heparin drip pending further management decisions. Venous Doppler study shows superficial thrombosis of right lesser saphenous vein. Left Baker's cyst noted. Echocardiogram showed No evidence for cor pulmonale or RV dysfunction on echocardiogram.  We will switch patient to oral anticoagulation with coumadin if no  immediate surgical procedure planned by general surgery and pulmonary .  She will also need a 5 day overlap with subcutaneous Lovenox until her INR is greater than 2.0   She will need anticoagulation for 3-6 months or as determined by heme-onc  Patient was evaluated by pulmonology and cleared to undergo GI procedure in the setting of her acute PE. Pulmonary recommends placement of IVC filter due to concern for epidural clot burden and withholding systemic anticoagulation at the time of surgery. Timing of IVC filter placement will be determined by general surgery,    Thickening of the cecum noted on CT scan Concerning for malignancy in view of recently discovered microcytic anemia. PATIENT IS STATUS POST EGD  AND COLONOSCOPY,    IMPRESSION: 1. Cecal mass, consistent with a malignant neoplasm, correlating to that seen on the CT scan 2. Left-sided diverticulosis..   Further imaging would be recommended by general surgery. Patient will need right colectomy Discussed with  pulmonary about the risk  Of undergoing abdominal surgery in the setting of recent PE, pulmonary also recommends Coumadin as treatment of choice long-term for patient's PE  , due to concern about the propensity of bleeding of the cecal mass. General surgery and pulmonary to decide on the timing of the right colectomy.     History of GERD Continue PPI. EGD today  History of diabetes mellitus type 2 without any complications Initiated on SSI, resume metformin at discharge  History of essential hypertension Continue monitoring blood pressure.  Microcytic anemia secondary to iron deficiency. She was  transfused about 2 weeks ago. Hemoglobin is stable. Her ferritin is 6. TIBC 528. Vitamin B-12 257. Continue B-12 supplementation  Morbid obesity Body mass index is 41.96 kg/(m^2).    Consults: Gastroenterology Pulmonary General  surgery    Significant Diagnostic Studies:  Dg Chest Port 1 View  10/25/2014   CLINICAL  DATA:  Shortness of breath, PE  EXAM: PORTABLE CHEST - 1 VIEW  COMPARISON:  None.  FINDINGS: Lungs are clear.  No pleural effusion or pneumothorax.  The heart is normal in size.  IMPRESSION: No evidence of acute cardiopulmonary disease.   Electronically Signed   By: Julian Hy M.D.   On: 10/25/2014 18:20     echocardiogram EF 60-65% Left ventricle: The cavity size was normal. Wall thickness was normal. Systolic function was normal. The estimated ejection fraction was in the range of 60% to 65%. Wall motion was normal; there were no regional wall motion abnormalities. - Left atrium: The atrium was mildly dilated.    Filed Weights   10/29/14 0535 10/30/14 0447 10/31/14 0610  Weight: 112.628 kg (248 lb 4.8 oz) 112.129 kg (247 lb 3.2 oz) 110.95 kg (244 lb 9.6 oz)     Microbiology: Recent Results (from the past 240 hour(s))  MRSA PCR Screening     Status: None   Collection Time: 10/25/14  5:08 PM  Result Value Ref Range Status   MRSA by PCR NEGATIVE NEGATIVE Final    Comment:        The GeneXpert MRSA Assay (FDA approved for NASAL specimens only), is one component of a comprehensive MRSA colonization surveillance program. It is not intended to diagnose MRSA infection nor to guide or monitor treatment for MRSA infections.        Blood Culture No results found for: SDES, SPECREQUEST, CULT, REPTSTATUS    Labs: Results for orders placed or performed during the hospital encounter of 10/25/14 (from the past 48 hour(s))  Glucose, capillary     Status: Abnormal   Collection Time: 10/29/14 11:37 AM  Result Value Ref Range   Glucose-Capillary 184 (H) 65 - 99 mg/dL  Glucose, capillary     Status: Abnormal   Collection Time: 10/29/14  4:07 PM  Result Value Ref Range   Glucose-Capillary 183 (H) 65 - 99 mg/dL  Glucose, capillary     Status: Abnormal   Collection Time: 10/29/14 10:01 PM  Result Value Ref Range   Glucose-Capillary 141 (H) 65 - 99 mg/dL  Heparin level  (unfractionated)     Status: None   Collection Time: 10/30/14  4:30 AM  Result Value Ref Range   Heparin Unfractionated 0.43 0.30 - 0.70 IU/mL    Comment:        IF HEPARIN RESULTS ARE BELOW EXPECTED VALUES, AND PATIENT DOSAGE HAS BEEN CONFIRMED, SUGGEST FOLLOW UP TESTING OF ANTITHROMBIN III LEVELS.   CBC     Status: Abnormal   Collection Time: 10/30/14  4:30 AM  Result Value Ref Range   WBC 8.9 4.0 - 10.5 K/uL   RBC 4.36 3.87 - 5.11 MIL/uL   Hemoglobin 10.2 (L) 12.0 - 15.0 g/dL   HCT 34.7 (L) 36.0 - 46.0 %   MCV 79.6 78.0 - 100.0 fL   MCH 23.4 (L) 26.0 - 34.0 pg   MCHC 29.4 (L) 30.0 - 36.0 g/dL   RDW 29.5 (H) 11.5 - 15.5 %   Platelets 396 150 - 400 K/uL  Glucose, capillary     Status: Abnormal   Collection Time: 10/30/14  7:23 AM  Result Value Ref Range   Glucose-Capillary 158 (H) 65 - 99 mg/dL  Glucose, capillary     Status: Abnormal   Collection Time: 10/30/14 10:58 AM  Result Value Ref Range   Glucose-Capillary 136 (H) 65 - 99 mg/dL  Glucose, capillary     Status: Abnormal   Collection Time: 10/30/14  1:04 PM  Result Value Ref Range   Glucose-Capillary 146 (H) 65 - 99 mg/dL  Glucose, capillary     Status: Abnormal   Collection Time: 10/30/14  4:29 PM  Result Value Ref Range   Glucose-Capillary 153 (H) 65 - 99 mg/dL  Glucose, capillary     Status: Abnormal   Collection Time: 10/30/14  9:02 PM  Result Value Ref Range   Glucose-Capillary 244 (H) 65 - 99 mg/dL  Heparin level (unfractionated)     Status: Abnormal   Collection Time: 10/30/14 10:10 PM  Result Value Ref Range   Heparin Unfractionated 0.27 (L) 0.30 - 0.70 IU/mL    Comment:        IF HEPARIN RESULTS ARE BELOW EXPECTED VALUES, AND PATIENT DOSAGE HAS BEEN CONFIRMED, SUGGEST FOLLOW UP TESTING OF ANTITHROMBIN III LEVELS.   Heparin level (unfractionated)     Status: None   Collection Time: 10/31/14  3:10 AM  Result Value Ref Range   Heparin Unfractionated 0.42 0.30 - 0.70 IU/mL    Comment:        IF  HEPARIN RESULTS ARE BELOW EXPECTED VALUES, AND PATIENT DOSAGE HAS BEEN CONFIRMED, SUGGEST FOLLOW UP TESTING OF ANTITHROMBIN III LEVELS.   CBC     Status: Abnormal   Collection Time: 10/31/14  3:10 AM  Result Value Ref Range   WBC 7.7 4.0 - 10.5 K/uL   RBC 4.08 3.87 - 5.11 MIL/uL   Hemoglobin 9.9 (L) 12.0 - 15.0 g/dL   HCT 32.5 (L) 36.0 - 46.0 %   MCV 79.7 78.0 - 100.0 fL   MCH 24.3 (L) 26.0 - 34.0 pg   MCHC 30.5 30.0 - 36.0 g/dL   RDW 29.1 (H) 11.5 - 15.5 %   Platelets 360 150 - 400 K/uL  Glucose, capillary     Status: Abnormal   Collection Time: 10/31/14  7:38 AM  Result Value Ref Range   Glucose-Capillary 190 (H) 65 - 99 mg/dL     Lipid Panel  No results found for: CHOL, TRIG, HDL, CHOLHDL, VLDL, LDLCALC, LDLDIRECT   No results found for: HGBA1C   Lab Results  Component Value Date   CREATININE 0.99 10/29/2014       Discharge Exam:    Blood pressure 127/62, pulse 94, temperature 98 F (36.7 C), temperature source Oral, resp. rate 18, height 5\' 4"  (1.626 m), weight 110.95 kg (244 lb 9.6 oz), SpO2 99 %. General appearance: alert, cooperative, appears stated age, no distress and morbidly obese Resp: Coarse breath sounds bilaterally with no wheezing, rales or rhonchi. Cardio: regular rate and rhythm, S1, S2 normal, no murmur, click, rub or gallop GI: Soft. Vague discomfort in the upper abdomen without any clear-cut rebound, rigidity or guarding. No masses or organomegaly. Bowel sounds present. Extremities: Nonpitting swelling noted in both lower extremities. Neurologic: No focal deficits    Discharge Instructions    Diet - low sodium heart healthy    Complete by:  As directed      Increase activity slowly    Complete by:  As directed            Follow-up Information  Follow up with [facility] C, MD. Schedule an appointment as soon as possible for a visit in 3 days.   Specialty:  Family Medicine   Contact information:   [place]  [phone]       Signed: Reyne Dumas 10/31/2014, 9:25 AM        Time spent >45 mins

## 2014-10-31 NOTE — Progress Notes (Signed)
ANTICOAGULATION CONSULT NOTE - Follow Up Consult  Pharmacy Consult for Heparin  Indication: pulmonary embolus  No Known Allergies  Patient Measurements: Height: 5\' 4"  (162.6 cm) Weight: 244 lb 9.6 oz (110.95 kg) IBW/kg (Calculated) : 54.7  Vital Signs: Temp: 98 F (36.7 C) (08/06 0610) Temp Source: Oral (08/06 0610) BP: 127/62 mmHg (08/06 0610) Pulse Rate: 94 (08/06 0610)  Labs:  Recent Labs  10/29/14 0310  10/30/14 0430 10/30/14 2210 10/31/14 0310 10/31/14 1040  HGB 9.8*  --  10.2*  --  9.9*  --   HCT 33.7*  --  34.7*  --  32.5*  --   PLT 380  --  396  --  360  --   HEPARINUNFRC  --   < > 0.43 0.27* 0.42 0.57  CREATININE 0.99  --   --   --   --   --   < > = values in this interval not displayed.  Estimated Creatinine Clearance: 79.2 mL/min (by C-G formula based on Cr of 0.99).   Assessment: 60 yoF initiated on heparin IV for PE. HL therapeutic at 0.57, Plt 360, Hgb 9.9 stable, no reports of bleeding noted in pt chart.  Goal of Therapy:  Heparin level 0.3-0.7 units/ml Monitor platelets by anticoagulation protocol: Yes   Plan:  -Will continue heparin at 1750 units/hr -Daily CBC/HL -Monitor for bleeding -Clarify plan for long-term [facility] as outpatient  Governor Specking, PharmD Clinical Pharmacy Resident Pager: [phone] 10/31/2014,3:35 PM

## 2014-11-01 LAB — GLUCOSE, CAPILLARY
Glucose-Capillary: 178 mg/dL — ABNORMAL HIGH (ref 65–99)
Glucose-Capillary: 209 mg/dL — ABNORMAL HIGH (ref 65–99)
Glucose-Capillary: 154 mg/dL — ABNORMAL HIGH (ref 65–99)
Glucose-Capillary: 254 mg/dL — ABNORMAL HIGH (ref 65–99)

## 2014-11-01 LAB — CBC
HCT: 35.1 % — ABNORMAL LOW (ref 36.0–46.0)
HEMOGLOBIN: 10.3 g/dL — AB (ref 12.0–15.0)
MCH: 23.4 pg — ABNORMAL LOW (ref 26.0–34.0)
MCHC: 29.3 g/dL — ABNORMAL LOW (ref 30.0–36.0)
MCV: 79.6 fL (ref 78.0–100.0)
Platelets: 389 10*3/uL (ref 150–400)
RBC: 4.41 MIL/uL (ref 3.87–5.11)
RDW: 29 % — AB (ref 11.5–15.5)
WBC: 7.5 10*3/uL (ref 4.0–10.5)

## 2014-11-01 LAB — HEPARIN LEVEL (UNFRACTIONATED): HEPARIN UNFRACTIONATED: 0.55 [IU]/mL (ref 0.30–0.70)

## 2014-11-01 MED ORDER — DOCUSATE SODIUM 100 MG PO CAPS
100.0000 mg | ORAL_CAPSULE | Freq: Two times a day (BID) | ORAL | Status: DC
  Administered 2014-11-01 – 2014-11-10 (×14): 100 mg via ORAL
  Filled 2014-11-01 (×16): qty 1

## 2014-11-01 NOTE — Progress Notes (Signed)
ANTICOAGULATION CONSULT NOTE - Follow Up Consult  Pharmacy Consult for Heparin  Indication: pulmonary embolus  No Known Allergies  Patient Measurements: Height: 5\' 4"  (162.6 cm) Weight: 242 lb 4.8 oz (109.907 kg) IBW/kg (Calculated) : 54.7  Vital Signs: Temp: 98 F (Krystal.7 C) (08/07 0500) BP: 105/44 mmHg (08/07 0500) Pulse Rate: 96 (08/07 0500)  Labs:  Recent Labs  10/30/14 0430  10/31/14 0310 10/31/14 1040 11/01/14 0347  HGB 10.2*  --  9.9*  --  10.3*  HCT 34.7*  --  32.5*  --  35.1*  PLT 396  --  360  --  389  HEPARINUNFRC 0.43  < > 0.42 0.57 0.55  < > = values in this interval not displayed.  Estimated Creatinine Clearance: 78.8 mL/min (by C-G formula based on Cr of 0.99).   Assessment: Krystal Campbell initiated on heparin IV for PE. HL remains therapeutic today at 0.55, Plt 389, Hgb 10.3 stable, no reports of bleeding noted in pt chart.  Underwent colonoscopy on 8/5 that revealed a cecal mass, several biopsies taken. Per gen. surgery note 8/6, plan is to go to OR early in week and change to LMWH and Warfarin post surgery.   Goal of Therapy:  Heparin level 0.3-0.7 units/ml Monitor platelets by anticoagulation protocol: Yes   Plan:  -Will continue heparin at 1750 units/hr -Daily CBC/HL -Monitor for bleeding -Clarify plan for long-term [facility] as outpatient  Governor Specking, PharmD Clinical Pharmacy Resident Pager: [phone] 11/01/2014,7:26 AM

## 2014-11-01 NOTE — Progress Notes (Signed)
Physician  Progress note     Krystal Campbell MRN: [id number] DOB/AGE: [birth year] 54 y.o.  PCP: Josem Kaufmann, MD   Admit date: 10/25/2014 Discharge date: Pending  Active diagnoses    Principal Problem:   Acute pulmonary embolism Active Problems:   Diabetes   Hypertension   GERD (gastroesophageal reflux disease)   Pulmonary emboli   Cecal lesion   Anxiety state   Iron deficiency anemia  status post EGD/colonoscopy Cecal mass       HPI :*54 y.o. female  With PMhx of HTN, DM, obesity.  Sent to [facility] around July 15th by her PCP after Hgb was found to be low. She has fatigue, SOB, and pica. PCP wanted a GI evaluation (UGD and colonoscopy). Patient was transfused and sent for an outpatient GI. She continued to feel poorly. Her daughter was in a car accident and patient drove 10 hours with a friend to check on her. She had a cough during that time and SOB. When patient returned, she saw her PCP who gave her azithromycin with no improvement.  Today, she presented to [facility] with continued SOB, fatigue.  At [facility], she was found to have a PE on CTA with acute heart strain. She also received a CT Scan of abd as her hgb was low at [employer] [facility] and was found to have cecal wall thickening with right mesenteric lymph node-- gastroenterology recommended EGD/colonoscopy  Subjective no hematochezia or melena, or tolerating regular diet  HOSPITAL COURSE:  Acute pulmonary embolism without suggestion of right heart strain Patient's risk factors include her recent road trip to [place]. In the setting of malignancy. Patient is currently hemodynamically stable. She was tachycardic on presentation. Heart rate is improved. Currently on a IV heparin drip pending further management decisions. Venous Doppler study shows superficial thrombosis of right lesser saphenous vein. Left Baker's cyst noted. Echocardiogram showed No evidence for cor pulmonale or RV dysfunction on echocardiogram.   We will switch patient to oral anticoagulation with coumadin if no immediate surgical procedure planned by general surgery and pulmonary .  She will also need a 5 day overlap with subcutaneous Lovenox until her INR is greater than 2.0  She will need anticoagulation for 3-6 months or as determined by heme-onc  Patient was evaluated by pulmonology and cleared to undergo GI procedure in the setting of her acute PE. Pulmonary recommends placement of IVC filter due to concern for epidural clot burden and withholding systemic anticoagulation at the time of surgery. Bilateral upper extremity venous Doppler negative. Timing of IVC filter placement will be determined by general surgery, Timing of surgery to be finalized tomorrow.    Thickening of the cecum noted on CT scan Concerning for malignancy in view of recently discovered microcytic anemia. PATIENT IS STATUS POST EGD  AND COLONOSCOPY,  hemoglobin has been stable for several days  IMPRESSION: 1. Cecal mass, consistent with a malignant neoplasm, correlating to that seen on the CT scan 2. Left-sided diverticulosis..   Further imaging would be recommended by general surgery. Patient will need right colectomy Discussed with  pulmonary about the risk  Of undergoing abdominal surgery in the setting of recent PE, pulmonary also recommends Coumadin as treatment of choice long-term for patient's PE  , due to concern about the propensity of bleeding of the cecal mass. General surgery and pulmonary to decide on the timing of the right colectomy, this is anticipated to be done tomorrow.     History of GERD Continue PPI. EGD today  History of diabetes mellitus type 2 without any complications Initiated on SSI, resume metformin at discharge  History of essential hypertension Continue monitoring blood pressure.  Microcytic anemia secondary to iron deficiency. She was transfused about 2 weeks ago. Hemoglobin is stable. Her ferritin is 6. TIBC 528. Vitamin  B-12 257. Continue B-12 supplementation  Morbid obesity Body mass index is 41.57 kg/(m^2).    Consults: Gastroenterology Pulmonary General  surgery    Significant Diagnostic Studies:  Dg Chest Port 1 View  10/25/2014   CLINICAL DATA:  Shortness of breath, PE  EXAM: PORTABLE CHEST - 1 VIEW  COMPARISON:  None.  FINDINGS: Lungs are clear.  No pleural effusion or pneumothorax.  The heart is normal in size.  IMPRESSION: No evidence of acute cardiopulmonary disease.   Electronically Signed   By: Julian Hy M.D.   On: 10/25/2014 18:20     echocardiogram EF 60-65% Left ventricle: The cavity size was normal. Wall thickness was normal. Systolic function was normal. The estimated ejection fraction was in the range of 60% to 65%. Wall motion was normal; there were no regional wall motion abnormalities. - Left atrium: The atrium was mildly dilated.    Filed Weights   10/30/14 0447 10/31/14 0610 11/01/14 0500  Weight: 112.129 kg (247 lb 3.2 oz) 110.95 kg (244 lb 9.6 oz) 109.907 kg (242 lb 4.8 oz)     Microbiology: Recent Results (from the past 240 hour(s))  MRSA PCR Screening     Status: None   Collection Time: 10/25/14  5:08 PM  Result Value Ref Range Status   MRSA by PCR NEGATIVE NEGATIVE Final    Comment:        The GeneXpert MRSA Assay (FDA approved for NASAL specimens only), is one component of a comprehensive MRSA colonization surveillance program. It is not intended to diagnose MRSA infection nor to guide or monitor treatment for MRSA infections.        Blood Culture No results found for: SDES, SPECREQUEST, CULT, REPTSTATUS    Labs: Results for orders placed or performed during the hospital encounter of 10/25/14 (from the past 48 hour(s))  Glucose, capillary     Status: Abnormal   Collection Time: 10/30/14  1:04 PM  Result Value Ref Range   Glucose-Capillary 146 (H) 65 - 99 mg/dL  Glucose, capillary     Status: Abnormal   Collection Time:  10/30/14  4:29 PM  Result Value Ref Range   Glucose-Capillary 153 (H) 65 - 99 mg/dL  Glucose, capillary     Status: Abnormal   Collection Time: 10/30/14  9:02 PM  Result Value Ref Range   Glucose-Capillary 244 (H) 65 - 99 mg/dL  Heparin level (unfractionated)     Status: Abnormal   Collection Time: 10/30/14 10:10 PM  Result Value Ref Range   Heparin Unfractionated 0.27 (L) 0.30 - 0.70 IU/mL    Comment:        IF HEPARIN RESULTS ARE BELOW EXPECTED VALUES, AND PATIENT DOSAGE HAS BEEN CONFIRMED, SUGGEST FOLLOW UP TESTING OF ANTITHROMBIN III LEVELS.   Heparin level (unfractionated)     Status: None   Collection Time: 10/31/14  3:10 AM  Result Value Ref Range   Heparin Unfractionated 0.42 0.30 - 0.70 IU/mL    Comment:        IF HEPARIN RESULTS ARE BELOW EXPECTED VALUES, AND PATIENT DOSAGE HAS BEEN CONFIRMED, SUGGEST FOLLOW UP TESTING OF ANTITHROMBIN III LEVELS.   CBC     Status: Abnormal   Collection  Time: 10/31/14  3:10 AM  Result Value Ref Range   WBC 7.7 4.0 - 10.5 K/uL   RBC 4.08 3.87 - 5.11 MIL/uL   Hemoglobin 9.9 (L) 12.0 - 15.0 g/dL   HCT 32.5 (L) 36.0 - 46.0 %   MCV 79.7 78.0 - 100.0 fL   MCH 24.3 (L) 26.0 - 34.0 pg   MCHC 30.5 30.0 - 36.0 g/dL   RDW 29.1 (H) 11.5 - 15.5 %   Platelets 360 150 - 400 K/uL  Glucose, capillary     Status: Abnormal   Collection Time: 10/31/14  7:38 AM  Result Value Ref Range   Glucose-Capillary 190 (H) 65 - 99 mg/dL  Heparin level (unfractionated)     Status: None   Collection Time: 10/31/14 10:40 AM  Result Value Ref Range   Heparin Unfractionated 0.57 0.30 - 0.70 IU/mL    Comment:        IF HEPARIN RESULTS ARE BELOW EXPECTED VALUES, AND PATIENT DOSAGE HAS BEEN CONFIRMED, SUGGEST FOLLOW UP TESTING OF ANTITHROMBIN III LEVELS.   Glucose, capillary     Status: Abnormal   Collection Time: 10/31/14 12:04 PM  Result Value Ref Range   Glucose-Capillary 278 (H) 65 - 99 mg/dL  Glucose, capillary     Status: Abnormal   Collection  Time: 10/31/14  4:54 PM  Result Value Ref Range   Glucose-Capillary 286 (H) 65 - 99 mg/dL  Glucose, capillary     Status: Abnormal   Collection Time: 10/31/14  8:38 PM  Result Value Ref Range   Glucose-Capillary 221 (H) 65 - 99 mg/dL  Heparin level (unfractionated)     Status: None   Collection Time: 11/01/14  3:47 AM  Result Value Ref Range   Heparin Unfractionated 0.55 0.30 - 0.70 IU/mL    Comment:        IF HEPARIN RESULTS ARE BELOW EXPECTED VALUES, AND PATIENT DOSAGE HAS BEEN CONFIRMED, SUGGEST FOLLOW UP TESTING OF ANTITHROMBIN III LEVELS.   CBC     Status: Abnormal   Collection Time: 11/01/14  3:47 AM  Result Value Ref Range   WBC 7.5 4.0 - 10.5 K/uL   RBC 4.41 3.87 - 5.11 MIL/uL   Hemoglobin 10.3 (L) 12.0 - 15.0 g/dL   HCT 35.1 (L) 36.0 - 46.0 %   MCV 79.6 78.0 - 100.0 fL   MCH 23.4 (L) 26.0 - 34.0 pg   MCHC 29.3 (L) 30.0 - 36.0 g/dL   RDW 29.0 (H) 11.5 - 15.5 %   Platelets 389 150 - 400 K/uL  Glucose, capillary     Status: Abnormal   Collection Time: 11/01/14  7:39 AM  Result Value Ref Range   Glucose-Capillary 178 (H) 65 - 99 mg/dL     Lipid Panel  No results found for: CHOL, TRIG, HDL, CHOLHDL, VLDL, LDLCALC, LDLDIRECT   No results found for: HGBA1C   Lab Results  Component Value Date   CREATININE 0.99 10/29/2014       Physical Exam:    Blood pressure 105/44, pulse 96, temperature 98 F (36.7 C), temperature source Oral, resp. rate 20, height 5\' 4"  (1.626 m), weight 109.907 kg (242 lb 4.8 oz), SpO2 98 %. General appearance: alert, cooperative, appears stated age, no distress and morbidly obese Resp: Coarse breath sounds bilaterally with no wheezing, rales or rhonchi. Cardio: regular rate and rhythm, S1, S2 normal, no murmur, click, rub or gallop GI: Soft. Vague discomfort in the upper abdomen without any clear-cut rebound, rigidity or guarding.  No masses or organomegaly. Bowel sounds present. Extremities: Nonpitting swelling noted in both lower  extremities. Neurologic: No focal deficits    Discharge Instructions    Diet - low sodium heart healthy    Complete by:  As directed      Increase activity slowly    Complete by:  As directed            Follow-up Information    Follow up with [facility] C, MD. Schedule an appointment as soon as possible for a visit in 3 days.   Specialty:  Family Medicine   Contact information:   [place] [phone]       Signed: Reyne Dumas 11/01/2014, 11:21 AM        Time spent >45 mins

## 2014-11-01 NOTE — Progress Notes (Signed)
GS Progress Note Subjective: No events overnight, tolerating diet, no hematochezia or melena  Objective: Vital signs in last 24 hours: Temp:  [97.8 F (36.6 C)-99.2 F (37.3 C)] 98 F (36.7 C) (08/07 0500) Pulse Rate:  [96-103] 96 (08/07 0500) Resp:  [18-20] 20 (08/07 0500) BP: (105-124)/(44-100) 105/44 mmHg (08/07 0500) SpO2:  [95 %-98 %] 98 % (08/07 0500) Weight:  [109.907 kg (242 lb 4.8 oz)] 109.907 kg (242 lb 4.8 oz) (08/07 0500) Last BM Date: 10/30/14  Intake/Output from previous day: 08/06 0701 - 08/07 0700 In: 45 [P.O.:480; I.V.:3] Out: -  Intake/Output this shift: Total I/O In: 240 [P.O.:240] Out: -   Lungs: CTAB  Abd: soft, NT, ND  Extremities: no edema  Neuro: AOx4  Lab Results: CBC   Recent Labs  10/31/14 0310 11/01/14 0347  WBC 7.7 7.5  HGB 9.9* 10.3*  HCT 32.5* 35.1*  PLT 360 389   BMET No results for input(s): NA, K, CL, CO2, GLUCOSE, BUN, CREATININE, CALCIUM in the last 72 hours. PT/INR No results for input(s): LABPROT, INR in the last 72 hours. ABG No results for input(s): PHART, HCO3 in the last 72 hours.  Invalid input(s): PCO2, PO2  Studies/Results: No results found.  Anti-infectives: Anti-infectives    None      Medicaions: Scheduled Meds: . antiseptic oral rinse  7 mL Mouth Rinse BID  . feeding supplement (GLUCERNA SHAKE)  237 mL Oral BID PC  . gabapentin  800 mg Oral TID  . insulin aspart  0-20 Units Subcutaneous TID WC  . insulin aspart  0-5 Units Subcutaneous QHS  . pantoprazole  40 mg Oral BID  . simvastatin  20 mg Oral q1800  . sodium chloride  3 mL Intravenous Q12H  . vitamin B-12  1,000 mcg Oral Daily   Continuous Infusions: . heparin 1,750 Units/hr (10/31/14 0705)   PRN Meds:.acetaminophen **OR** acetaminophen, HYDROcodone-acetaminophen, LORazepam, ondansetron **OR** ondansetron (ZOFRAN) IV  Assessment/Plan: s/p Procedure(s): COLONOSCOPY WITH PROPOFOL ESOPHAGOGASTRODUODENOSCOPY (EGD) WITH  PROPOFOL Continue anticoagulation Timing of surgery to be finalized tomorrow.  LOS: 7 days    [place] [facility] Surgeon [phone] Surgery 11/01/2014

## 2014-11-02 ENCOUNTER — Encounter (HOSPITAL_COMMUNITY): Payer: Self-pay | Admitting: Gastroenterology

## 2014-11-02 ENCOUNTER — Inpatient Hospital Stay (HOSPITAL_COMMUNITY): Payer: 59

## 2014-11-02 LAB — CBC
HCT: 33.7 % — ABNORMAL LOW (ref 36.0–46.0)
HEMOGLOBIN: 10 g/dL — AB (ref 12.0–15.0)
MCH: 24 pg — ABNORMAL LOW (ref 26.0–34.0)
MCHC: 29.7 g/dL — AB (ref 30.0–36.0)
MCV: 80.8 fL (ref 78.0–100.0)
Platelets: 350 10*3/uL (ref 150–400)
RBC: 4.17 MIL/uL (ref 3.87–5.11)
RDW: 28.7 % — AB (ref 11.5–15.5)
WBC: 7.5 10*3/uL (ref 4.0–10.5)

## 2014-11-02 LAB — COMPREHENSIVE METABOLIC PANEL
ALT: 19 U/L (ref 14–54)
ANION GAP: 9 (ref 5–15)
AST: 30 U/L (ref 15–41)
Albumin: 3 g/dL — ABNORMAL LOW (ref 3.5–5.0)
Alkaline Phosphatase: 73 U/L (ref 38–126)
BUN: 9 mg/dL (ref 6–20)
CALCIUM: 9 mg/dL (ref 8.9–10.3)
CO2: 23 mmol/L (ref 22–32)
Chloride: 104 mmol/L (ref 101–111)
Creatinine, Ser: 1 mg/dL (ref 0.44–1.00)
GLUCOSE: 193 mg/dL — AB (ref 65–99)
Potassium: 4.3 mmol/L (ref 3.5–5.1)
Sodium: 136 mmol/L (ref 135–145)
TOTAL PROTEIN: 6.3 g/dL — AB (ref 6.5–8.1)
Total Bilirubin: 0.4 mg/dL (ref 0.3–1.2)

## 2014-11-02 LAB — GLUCOSE, CAPILLARY
GLUCOSE-CAPILLARY: 180 mg/dL — AB (ref 65–99)
Glucose-Capillary: 202 mg/dL — ABNORMAL HIGH (ref 65–99)
Glucose-Capillary: 142 mg/dL — ABNORMAL HIGH (ref 65–99)
Glucose-Capillary: 180 mg/dL — ABNORMAL HIGH (ref 65–99)

## 2014-11-02 LAB — HEPARIN LEVEL (UNFRACTIONATED): HEPARIN UNFRACTIONATED: 0.66 [IU]/mL (ref 0.30–0.70)

## 2014-11-02 MED ORDER — POLYETHYLENE GLYCOL 3350 17 G PO PACK
17.0000 g | PACK | Freq: Every day | ORAL | Status: DC
  Administered 2014-11-02: 17 g via ORAL
  Filled 2014-11-02: qty 1

## 2014-11-02 MED ORDER — BOOST / RESOURCE BREEZE PO LIQD
1.0000 | Freq: Three times a day (TID) | ORAL | Status: DC
  Administered 2014-11-03 – 2014-11-08 (×4): 1 via ORAL

## 2014-11-02 MED ORDER — HYDROMORPHONE HCL 1 MG/ML IJ SOLN
2.0000 mg | INTRAMUSCULAR | Status: DC | PRN
  Administered 2014-11-02 – 2014-11-03 (×4): 2 mg via INTRAVENOUS
  Administered 2014-11-04: 1 mg via INTRAVENOUS
  Administered 2014-11-06: 2 mg via INTRAVENOUS
  Filled 2014-11-02 (×5): qty 2

## 2014-11-02 NOTE — Progress Notes (Signed)
Patient ID: Krystal Campbell, female   DOB: 02/17/1961, 53 y.o.   MRN: [id number] 3 Days Post-Op  Subjective: Pt feels awful this morning.  Not tolerating her solid diet.  Having increase in lower abdominal pain.  Unable to have a BM.  Wanting more laxatives.  Objective: Vital signs in last 24 hours: Temp:  [98 F (36.7 C)-99.2 F (37.3 C)] 98 F (36.7 C) (08/08 0600) Pulse Rate:  [88-101] 88 (08/08 0600) Resp:  [18-20] 20 (08/08 0600) BP: (103-134)/(51-69) 127/64 mmHg (08/08 0600) SpO2:  [98 %-100 %] 100 % (08/08 0600) Last BM Date: 10/30/14  Intake/Output from previous day: 08/07 0701 - 08/08 0700 In: 240 [P.O.:240] Out: -  Intake/Output this shift: Total I/O In: 240 [P.O.:240] Out: -   PE: Abd: soft, tender in lower abdomen, some BS, obese Heart: regular  Lab Results:   Recent Labs  11/01/14 0347 11/02/14 0449  WBC 7.5 7.5  HGB 10.3* 10.0*  HCT 35.1* 33.7*  PLT 389 350   BMET  Recent Labs  11/02/14 0449  NA 136  K 4.3  CL 104  CO2 23  GLUCOSE 193*  BUN 9  CREATININE 1.00  CALCIUM 9.0   PT/INR No results for input(s): LABPROT, INR in the last 72 hours. CMP     Component Value Date/Time   NA 136 11/02/2014 0449   K 4.3 11/02/2014 0449   CL 104 11/02/2014 0449   CO2 23 11/02/2014 0449   GLUCOSE 193* 11/02/2014 0449   BUN 9 11/02/2014 0449   CREATININE 1.00 11/02/2014 0449   CALCIUM 9.0 11/02/2014 0449   PROT 6.3* 11/02/2014 0449   ALBUMIN 3.0* 11/02/2014 0449   AST 30 11/02/2014 0449   ALT 19 11/02/2014 0449   ALKPHOS 73 11/02/2014 0449   BILITOT 0.4 11/02/2014 0449   GFRNONAA >60 11/02/2014 0449   GFRAA >60 11/02/2014 0449   Lipase     Component Value Date/Time   LIPASE 36 10/05/2009 1910       Studies/Results: No results found.  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  She will then need to be reprepped and can plan for surgical intervention mid to late this week. -Pulmonology has cleared her for surgery -I have decreased her diet back to clear liquids as she is not tolerating a solid diet at this time. -Pending colonoscopy biopsies Pulmonary embolism ?hypercoaguable state from cancer vs long car ride -On heparin, Elliquis on hold right now Microcytic anemia secondary to iron deficiency/ABL anemia -s/p 2 units pRBC at [facility] 2 weeks ago DM/HLD/HTN/Obesity BMI 42  LOS: 8 days    Krystal Campbell E 11/02/2014, 9:05 AM Pager: [phone]

## 2014-11-02 NOTE — H&P (Signed)
Chief Complaint: Patient was seen in consultation today for bilateral PE in pre-surgical setting at the request of [facility]  Referring Physician(s): [facility]  History of Present Illness: Krystal Campbell is a 54 y.o. female with c/o progressive shortness of breath and chest tightness. CT imaging revealed B/L PE and cecal mass. She underwent endoscopy and colonoscopy with biopsy of cecal mass on 8/5, pathology revealed adenocarcinoma and [facility] has evaluated the patient and she will undergo right hemicolectomy mid week. She is currently on IV heparin for her PE and IR received request for retrievable IVC filter pre-surgery. She denies any chest pain today and states her shortness of breath has improved. She denies any active signs of bleeding or excessive bruising. The patient denies any history of sleep apnea or chronic oxygen use. She has previously tolerated sedation without complications. 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   Past Medical History  Diagnosis Date  . Diabetes mellitus without complication   . Hypertension   . Hyperlipidemia   . Arthritis     in knees    Past Surgical History  Procedure Laterality Date  . Replacement total knee    . Abdominal hysterectomy    . Kidney surgery      when patient was a teenager  . Joint replacement  4/15    Total right knee replacement; left knee scoped  . Colonoscopy with propofol Left 10/30/2014    Procedure: COLONOSCOPY WITH PROPOFOL;  Surgeon: Ronald Lobo, MD;  Location: [facility] ENDOSCOPY;  Service: Endoscopy;  Laterality: Left;  . Esophagogastroduodenoscopy (egd) with propofol Left 10/30/2014    Procedure: ESOPHAGOGASTRODUODENOSCOPY (EGD) WITH PROPOFOL;  Surgeon: Ronald Lobo, MD;  Location: [facility] ENDOSCOPY;  Service: Endoscopy;  Laterality: Left;    Allergies: Review of patient's allergies indicates no known allergies.  Medications: Prior to Admission  medications   Medication Sig Start Date End Date Taking? Authorizing Provider  azithromycin (ZITHROMAX) 250 MG tablet Take 250 mg by mouth daily.   Yes Historical Provider, MD  ferrous sulfate 325 (65 FE) MG tablet Take 1 tablet (325 mg total) by mouth 2 (two) times daily with a meal. 10/11/14  Yes Kathie Dike, MD  gabapentin (NEURONTIN) 800 MG tablet Take 800 mg by mouth 3 (three) times daily.   Yes Historical Provider, MD  LORazepam (ATIVAN) 1 MG tablet Take 0.5 mg by mouth 3 (three) times daily as needed for anxiety.    Yes Historical Provider, MD  Melatonin 3 MG CAPS Take 6 mg by mouth at bedtime as needed (for sleep).    Yes Historical Provider, MD  metFORMIN (GLUCOPHAGE) 1000 MG tablet Take 1,000 mg by mouth 2 (two) times daily.   Yes Historical Provider, MD  pantoprazole (PROTONIX) 40 MG tablet Take 1 tablet (40 mg total) by mouth 2 (two) times daily. 10/11/14  Yes Kathie Dike, MD  simvastatin (ZOCOR) 20 MG tablet Take 20 mg by mouth at bedtime.    Yes Historical Provider, MD  apixaban (ELIQUIS) 5 MG TABS tablet 10 mg by mouth twice a day 7 days, then 5 mg by mouth twice a day 10/30/14   Reyne Dumas, MD     Family History  Problem Relation Age of Onset  . Healthy Mother     no clotting issues in either parents  . Healthy Father     History   Social History  . Marital Status: Married    Spouse  Name: N/A  . Number of Children: N/A  . Years of Education: N/A   Social History Main Topics  . Smoking status: Former Research scientist (life sciences)  . Smokeless tobacco: Not on file  . Alcohol Use: Yes     Comment: rarely  . Drug Use: Not on file  . Sexual Activity: Not on file   Other Topics Concern  . None   Social History Narrative    Review of Systems: A 12 point ROS discussed and pertinent positives are indicated in the HPI above.  All other systems are negative.  Review of Systems  Vital Signs: BP 151/59 mmHg  Pulse 99  Temp(Src) 98.7 F (37.1 C) (Oral)  Resp 18  Ht 5\' 4"  (1.626 m)   Wt 242 lb 4.8 oz (109.907 kg)  BMI 41.57 kg/m2  SpO2 100%  Physical Exam  Constitutional: She is oriented to person, place, and time. No distress.  HENT:  Head: Normocephalic and atraumatic.  Neck: No tracheal deviation present.  Cardiovascular: Normal rate and regular rhythm.  Exam reveals no gallop and no friction rub.   No murmur heard. Pulmonary/Chest: Effort normal and breath sounds normal. No respiratory distress. She has no wheezes. She has no rales.  Abdominal: Soft. Bowel sounds are normal. She exhibits no distension. There is no tenderness.  Musculoskeletal: She exhibits no edema.  Neurological: She is alert and oriented to person, place, and time.  Skin: Skin is warm and dry. She is not diaphoretic.    Mallampati Score:  MD Evaluation Airway: WNL Heart: WNL Abdomen: WNL Chest/ Lungs: WNL ASA  Classification: 3 Mallampati/Airway Score: Two  Imaging: Dg Chest Port 1 View  10/25/2014   CLINICAL DATA:  Shortness of breath, PE  EXAM: PORTABLE CHEST - 1 VIEW  COMPARISON:  None.  FINDINGS: Lungs are clear.  No pleural effusion or pneumothorax.  The heart is normal in size.  IMPRESSION: No evidence of acute cardiopulmonary disease.   Electronically Signed   By: Julian Hy M.D.   On: 10/25/2014 18:20    Labs:  CBC:  Recent Labs  10/30/14 0430 10/31/14 0310 11/01/14 0347 11/02/14 0449  WBC 8.9 7.7 7.5 7.5  HGB 10.2* 9.9* 10.3* 10.0*  HCT 34.7* 32.5* 35.1* 33.7*  PLT 396 360 389 350    COAGS: No results for input(s): INR, APTT in the last 8760 hours.  BMP:  Recent Labs  10/27/14 0409 10/28/14 0354 10/29/14 0310 11/02/14 0449  NA 138 141 137 136  K 4.0 3.9 4.2 4.3  CL 102 105 105 104  CO2 25 27 23 23   GLUCOSE 177* 171* 294* 193*  BUN 13 9 10 9   CALCIUM 8.8* 9.1 8.7* 9.0  CREATININE 1.11* 1.10* 0.99 1.00  GFRNONAA 55* 56* >60 >60  GFRAA >60 >60 >60 >60    LIVER FUNCTION TESTS:  Recent Labs  10/25/14 1831 10/27/14 0409 10/29/14 0310  11/02/14 0449  BILITOT 0.6 0.5 0.4 0.4  AST 28 21 23 30   ALT 17 14 16 19   ALKPHOS 79 66 71 73  PROT 6.9 6.3* 6.0* 6.3*  ALBUMIN 3.1* 2.9* 2.8* 3.0*   Assessment and Plan: Progressive shortness of breath, chest pain CT 7/31 with Bilateral PE, no evidence of LE DVT, on IV heparin Cecal mass on CT s/p colonoscopy and biopsy on 8/5, pathology revealed adenocarcinoma Patient to undergo right hemicolectomy Wednesday, request for pre-surgical retrievable IVC filter placement  The patient has been NPO, no blood thinners taken, labs and vitals have been reviewed.  Risks and Benefits discussed with the patient including, but not limited to bleeding, infection, contrast induced renal failure, filter fracture or migration which can lead to emergency surgery or even death, strut penetration with damage or irritation to adjacent structures and caval thrombosis. All of the patient's questions were answered, patient is agreeable to proceed. Consent signed and in chart.    Thank you for this interesting consult.  I greatly enjoyed meeting Krystal Campbell and look forward to participating in their care.  A copy of this report was sent to the requesting provider on this date.  SignedHedy Jacob 11/02/2014, 4:12 PM   I spent a total of 20 Minutes in face to face in clinical consultation, greater than 50% of which was counseling/coordinating care for bilateral pulmonary embolism in pre-surgical setting.

## 2014-11-02 NOTE — Care Management Note (Addendum)
Case Management Note  Patient Details  Name: Krystal Campbell MRN: [id number] Date of Birth: [birth year]  Subjective/Objective:  Pt admitted for SOB. Positive PE. Initiated on IV heparin gtt. Pt will need R colectomy before d/c. CT positive for cecal mass.                   Action/Plan: CM will monitor for disposition needs.    Expected Discharge Date:                  Expected Discharge Plan:  Home/Self Care  In-House Referral:     Discharge planning Services  CM Consult  Post Acute Care Choice:    Choice offered to:     DME Arranged:    DME Agency:     HH Arranged:    HH Agency:     Status of Service:  In process, will continue to follow  Medicare Important Message Given:    Date Medicare IM Given:    Medicare IM give by:    Date Additional Medicare IM Given:    Additional Medicare Important Message give by:     If discussed at [place] of Stay Meetings, dates discussed:    Additional Comments:   11-06-14 [address] [place] Kra[place], [place] [phone] CM received referral from MD in reference to Lovenox injections. Benefits check in process for medications. Pt uses [facility] in [place]. CM did call pharmacy and they do not have medication available. Pharmacy can order the 120 mg syringes for 100 mg dose.  Will make pt aware of cost once benefits check completed.   Pt copay lovenox will be $160- generic will be $40 - prior Josem Kaufmann is not required     1240 11-03-14 Jacqlyn Krauss, RN,BSN [phone] Pt post IVC Filter. Per MD notes possible plan for R colectomy on 11-04-14. CM will continue to monitor for disposition needs.   Bethena Roys, RN 11/02/2014, 3:04 PM

## 2014-11-02 NOTE — Progress Notes (Signed)
Dr. Cristina Gong just called and confirmed her pathology report was adenocarcinoma.  Krystal Campbell E 2:38 PM 11/02/2014

## 2014-11-02 NOTE — Progress Notes (Signed)
UR Completed [place] Lesure Graves-Bigelow, RN,BSN [phone]  

## 2014-11-02 NOTE — Progress Notes (Signed)
Physician  Progress note     Krystal Campbell MRN: [id number] DOB/AGE: [birth year] 54 y.o.  PCP: Josem Kaufmann, MD   Admit date: 10/25/2014 Discharge date: Pending  Active diagnoses    Principal Problem:   Acute pulmonary embolism Active Problems:   Diabetes   Hypertension   GERD (gastroesophageal reflux disease)   Pulmonary emboli   Cecal lesion   Anxiety state   Iron deficiency anemia  status post EGD/colonoscopy Cecal mass       HPI :*54 y.o. female  With PMhx of HTN, DM, obesity.  Sent to [facility] around July 15th by her PCP after Hgb was found to be low. She has fatigue, SOB, and pica. PCP wanted a GI evaluation (UGD and colonoscopy). Patient was transfused and sent for an outpatient GI. She continued to feel poorly. Her daughter was in a car accident and patient drove 10 hours with a friend to check on her. She had a cough during that time and SOB. When patient returned, she saw her PCP who gave her azithromycin with no improvement.  Today, she presented to [facility] with continued SOB, fatigue.  At [facility], she was found to have a PE on CTA with acute heart strain. She also received a CT Scan of abd as her hgb was low at [facility] and was found to have cecal wall thickening with right mesenteric lymph node-- gastroenterology recommended EGD/colonoscopy  Subjective Complaining of constipation today no nausea no vomiting  HOSPITAL COURSE:  Acute pulmonary embolism without suggestion of right heart strain Patient's risk factors include her recent road trip to [place]. In the setting of malignancy. Patient is currently hemodynamically stable. She was tachycardic on presentation. Heart rate is improved. Currently on a IV heparin drip pending further management decisions. Venous Doppler study shows superficial thrombosis of right lesser saphenous vein. Left Baker's cyst noted. Echocardiogram showed No evidence for cor pulmonale or RV dysfunction on echocardiogram.   Continue heparin drip pending decision for surgery, start oral anticoagulation with coumadin if no immediate surgical procedure planned by general surgery and pulmonary .  She will also need a 5 day overlap with subcutaneous Lovenox until her INR is greater than 2.0  She will need anticoagulation for 3-6 months or as determined by heme-onc  Patient was evaluated by pulmonology and cleared to undergo GI procedure in the setting of her acute PE. Pulmonary recommends placement of IVC filter due to concern for epidural clot burden and withholding systemic anticoagulation at the time of surgery. Bilateral upper extremity venous Doppler negative. Timing of IVC filter placement will be determined by general surgery, Timing of surgery to be finalized tomorrow.  Constipation-continue Miralax, Colace  Thickening of the cecum noted on CT scan Concerning for malignancy in view of recently discovered microcytic anemia. PATIENT IS STATUS POST EGD  AND COLONOSCOPY,  hemoglobin has been stable for several days  IMPRESSION: 1. Cecal mass, consistent with a malignant neoplasm, correlating to that seen on the CT scan 2. Left-sided diverticulosis..   Further imaging would be recommended by general surgery. Patient will need right colectomy Discussed with  pulmonary about the risk  Of undergoing abdominal surgery in the setting of recent PE, pulmonary also recommends Coumadin as treatment of choice long-term for patient's PE  , due to concern about the propensity of bleeding of the cecal mass. General surgery and pulmonary to decide on the timing of the right colectomy, this is anticipated to be done tomorrow.     History of GERD  Continue PPI. Status post EGD  History of diabetes mellitus type 2 without any complications Initiated on SSI, resume metformin at discharge  History of essential hypertension Continue monitoring blood pressure.  Microcytic anemia secondary to iron deficiency. She was transfused  about 2 weeks ago. Hemoglobin has been stable for several days. Her ferritin is 6. TIBC 528. Vitamin B-12 257. Continue B-12 supplementation  Morbid obesity Body mass index is 41.57 kg/(m^2).    Consults: Gastroenterology Pulmonary General  surgery    Significant Diagnostic Studies:  Dg Chest Port 1 View  10/25/2014   CLINICAL DATA:  Shortness of breath, PE  EXAM: PORTABLE CHEST - 1 VIEW  COMPARISON:  None.  FINDINGS: Lungs are clear.  No pleural effusion or pneumothorax.  The heart is normal in size.  IMPRESSION: No evidence of acute cardiopulmonary disease.   Electronically Signed   By: Julian Hy M.D.   On: 10/25/2014 18:20     echocardiogram EF 60-65% Left ventricle: The cavity size was normal. Wall thickness was normal. Systolic function was normal. The estimated ejection fraction was in the range of 60% to 65%. Wall motion was normal; there were no regional wall motion abnormalities. - Left atrium: The atrium was mildly dilated.    Filed Weights   10/30/14 0447 10/31/14 0610 11/01/14 0500  Weight: 112.129 kg (247 lb 3.2 oz) 110.95 kg (244 lb 9.6 oz) 109.907 kg (242 lb 4.8 oz)     Microbiology: Recent Results (from the past 240 hour(s))  MRSA PCR Screening     Status: None   Collection Time: 10/25/14  5:08 PM  Result Value Ref Range Status   MRSA by PCR NEGATIVE NEGATIVE Final    Comment:        The GeneXpert MRSA Assay (FDA approved for NASAL specimens only), is one component of a comprehensive MRSA colonization surveillance program. It is not intended to diagnose MRSA infection nor to guide or monitor treatment for MRSA infections.        Blood Culture No results found for: SDES, SPECREQUEST, CULT, REPTSTATUS    Labs: Results for orders placed or performed during the hospital encounter of 10/25/14 (from the past 48 hour(s))  Glucose, capillary     Status: Abnormal   Collection Time: 10/31/14 12:04 PM  Result Value Ref Range    Glucose-Capillary 278 (H) 65 - 99 mg/dL  Glucose, capillary     Status: Abnormal   Collection Time: 10/31/14  4:54 PM  Result Value Ref Range   Glucose-Capillary 286 (H) 65 - 99 mg/dL  Glucose, capillary     Status: Abnormal   Collection Time: 10/31/14  8:38 PM  Result Value Ref Range   Glucose-Capillary 221 (H) 65 - 99 mg/dL  Heparin level (unfractionated)     Status: None   Collection Time: 11/01/14  3:47 AM  Result Value Ref Range   Heparin Unfractionated 0.55 0.30 - 0.70 IU/mL    Comment:        IF HEPARIN RESULTS ARE BELOW EXPECTED VALUES, AND PATIENT DOSAGE HAS BEEN CONFIRMED, SUGGEST FOLLOW UP TESTING OF ANTITHROMBIN III LEVELS.   CBC     Status: Abnormal   Collection Time: 11/01/14  3:47 AM  Result Value Ref Range   WBC 7.5 4.0 - 10.5 K/uL   RBC 4.41 3.87 - 5.11 MIL/uL   Hemoglobin 10.3 (L) 12.0 - 15.0 g/dL   HCT 35.1 (L) 36.0 - 46.0 %   MCV 79.6 78.0 - 100.0 fL   MCH 23.4 (  L) 26.0 - 34.0 pg   MCHC 29.3 (L) 30.0 - 36.0 g/dL   RDW 29.0 (H) 11.5 - 15.5 %   Platelets 389 150 - 400 K/uL  Glucose, capillary     Status: Abnormal   Collection Time: 11/01/14  7:39 AM  Result Value Ref Range   Glucose-Capillary 178 (H) 65 - 99 mg/dL  Glucose, capillary     Status: Abnormal   Collection Time: 11/01/14 11:29 AM  Result Value Ref Range   Glucose-Capillary 209 (H) 65 - 99 mg/dL  Glucose, capillary     Status: Abnormal   Collection Time: 11/01/14  4:46 PM  Result Value Ref Range   Glucose-Capillary 154 (H) 65 - 99 mg/dL  Glucose, capillary     Status: Abnormal   Collection Time: 11/01/14  8:00 PM  Result Value Ref Range   Glucose-Capillary 254 (H) 65 - 99 mg/dL   Comment 1 Notify RN   Heparin level (unfractionated)     Status: None   Collection Time: 11/02/14  4:49 AM  Result Value Ref Range   Heparin Unfractionated 0.66 0.30 - 0.70 IU/mL    Comment:        IF HEPARIN RESULTS ARE BELOW EXPECTED VALUES, AND PATIENT DOSAGE HAS BEEN CONFIRMED, SUGGEST FOLLOW UP  TESTING OF ANTITHROMBIN III LEVELS.   CBC     Status: Abnormal   Collection Time: 11/02/14  4:49 AM  Result Value Ref Range   WBC 7.5 4.0 - 10.5 K/uL   RBC 4.17 3.87 - 5.11 MIL/uL   Hemoglobin 10.0 (L) 12.0 - 15.0 g/dL    Comment: CONSISTENT WITH PREVIOUS RESULT   HCT 33.7 (L) 36.0 - 46.0 %   MCV 80.8 78.0 - 100.0 fL   MCH 24.0 (L) 26.0 - 34.0 pg   MCHC 29.7 (L) 30.0 - 36.0 g/dL   RDW 28.7 (H) 11.5 - 15.5 %   Platelets 350 150 - 400 K/uL  Comprehensive metabolic panel     Status: Abnormal   Collection Time: 11/02/14  4:49 AM  Result Value Ref Range   Sodium 136 135 - 145 mmol/L   Potassium 4.3 3.5 - 5.1 mmol/L   Chloride 104 101 - 111 mmol/L   CO2 23 22 - 32 mmol/L   Glucose, Bld 193 (H) 65 - 99 mg/dL   BUN 9 6 - 20 mg/dL   Creatinine, Ser 1.00 0.44 - 1.00 mg/dL   Calcium 9.0 8.9 - 10.3 mg/dL   Total Protein 6.3 (L) 6.5 - 8.1 g/dL   Albumin 3.0 (L) 3.5 - 5.0 g/dL   AST 30 15 - 41 U/L   ALT 19 14 - 54 U/L   Alkaline Phosphatase 73 38 - 126 U/L   Total Bilirubin 0.4 0.3 - 1.2 mg/dL   GFR calc non Af Amer >60 >60 mL/min   GFR calc Af Amer >60 >60 mL/min    Comment: (NOTE) The eGFR has been calculated using the CKD EPI equation. This calculation has not been validated in all clinical situations. eGFR's persistently <60 mL/min signify possible Chronic Kidney Disease.    Anion gap 9 5 - 15  Glucose, capillary     Status: Abnormal   Collection Time: 11/02/14  7:32 AM  Result Value Ref Range   Glucose-Capillary 180 (H) 65 - 99 mg/dL     Lipid Panel  No results found for: CHOL, TRIG, HDL, CHOLHDL, VLDL, LDLCALC, LDLDIRECT   No results found for: HGBA1C   Lab Results  Component  Value Date   CREATININE 1.00 11/02/2014       Physical Exam:    Blood pressure 127/64, pulse 88, temperature 98 F (36.7 C), temperature source Oral, resp. rate 20, height '5\' 4"'  (1.626 m), weight 109.907 kg (242 lb 4.8 oz), SpO2 100 %. General appearance: alert, cooperative, appears  stated age, no distress and morbidly obese Resp: Coarse breath sounds bilaterally with no wheezing, rales or rhonchi. Cardio: regular rate and rhythm, S1, S2 normal, no murmur, click, rub or gallop GI: Soft. Vague discomfort in the upper abdomen without any clear-cut rebound, rigidity or guarding. No masses or organomegaly. Bowel sounds present. Extremities: Nonpitting swelling noted in both lower extremities. Neurologic: No focal deficits    Discharge Instructions    Diet - low sodium heart healthy    Complete by:  As directed      Increase activity slowly    Complete by:  As directed            Follow-up Information    Follow up with [facility], MD. Schedule an appointment as soon as possible for a visit in 3 days.   Specialty:  Family Medicine   Contact information:   [place] [address] [phone]       Signed: Reyne Dumas 11/02/2014, 11:23 AM        Time spent >45 mins

## 2014-11-02 NOTE — Progress Notes (Signed)
ANTICOAGULATION CONSULT NOTE - Follow Up Consult  Pharmacy Consult for Heparin  Indication: pulmonary embolus  No Known Allergies  Patient Measurements: Height: 5\' 4"  (162.6 cm) Weight: 242 lb 4.8 oz (109.907 kg) IBW/kg (Calculated) : 54.7  Vital Signs: Temp: 98 F (36.7 C) (08/08 0600) Temp Source: Oral (08/08 0600) BP: 127/64 mmHg (08/08 0600) Pulse Rate: 88 (08/08 0600)  Labs:  Recent Labs  10/31/14 0310 10/31/14 1040 11/01/14 0347 11/02/14 0449  HGB 9.9*  --  10.3* 10.0*  HCT 32.5*  --  35.1* 33.7*  PLT 360  --  389 350  HEPARINUNFRC 0.42 0.57 0.55 0.66  CREATININE  --   --   --  1.00    Estimated Creatinine Clearance: 78 mL/min (by C-G formula based on Cr of 1).   Assessment: 54 yo F initiated on heparin IV for PE. Heparin level remains therapeutic today at 0.66, Plt 389, Hgb 10.3 stable, no reports of bleeding noted in pt chart.  Underwent colonoscopy on 8/5 that revealed a cecal mass, several biopsies taken. Per general surgery note, pt needs IVC filter placed before surgery,with plansfor surgery mid-week, then change to LMWH and Warfarin post surgery.  Exact timing of surgery still pending.  Goal of Therapy:  Heparin level 0.3-0.7 units/ml Monitor platelets by anticoagulation protocol: Yes   Plan:  Continue heparin at 1750 units/hr Daily CBC/HL Monitor for bleeding Follow up [facility] plan  [employer], Pharm.D., BCPS Clinical Pharmacist Pager [phone] 11/02/2014 10:22 AM

## 2014-11-02 NOTE — Progress Notes (Addendum)
Pt complaint 10/10 severe upper abd pain.  Dr. Allyson Sabal notified, no new orders received.  Will continue to closely monitor.   Dr. Allyson Sabal notified of above and new orders received and carried out.  Sanda Linger

## 2014-11-02 NOTE — Progress Notes (Signed)
Nutrition Follow-up  DOCUMENTATION CODES:   Morbid obesity  INTERVENTION:    Boost Breeze po TID, each supplement provides 250 kcal and 9 grams of protein  NUTRITION DIAGNOSIS:   Inadequate oral intake related to poor appetite, altered GI function as evidenced by per patient/family report.  Ongoing  GOAL:   Patient will meet greater than or equal to 90% of their needs  Unmet  MONITOR:   PO intake, Labs, Skin, I & O's   ASSESSMENT:   54 year old Caucasian female with a past medical history of obesity, hypertension, diabetes and recently diagnosed anemia for which she was transfused about a week ago. Presented with cough, shortness of breath and was found to have acute pulmonary embolism.  Patient now with increasing abdominal pain. Unable to have a BM. Diet downgraded to clear liquids this AM. Plans for right colectomy after IVC filter placement this week. Labs reviewed.  Diet Order:  Clear Liquids  Skin:  Reviewed, no issues  Last BM:  8/5  Height:   Ht Readings from Last 1 Encounters:  10/25/14 5\' 4"  (1.626 m)    Weight:   Wt Readings from Last 1 Encounters:  11/01/14 242 lb 4.8 oz (109.907 kg)   10/25/14  257 lb 12.8 oz (116.937 kg)  Ideal Body Weight:  54.5 kg  BMI:  Body mass index is 41.57 kg/(m^2).  Estimated Nutritional Needs:   Kcal:  1700-2000  Protein:  100-120 gm  Fluid:  2.5 L  EDUCATION NEEDS:   No education needs identified at this time   Molli Barrows, [place], [place], [place] Pager [phone] After Hours Pager [phone]

## 2014-11-03 ENCOUNTER — Inpatient Hospital Stay (HOSPITAL_COMMUNITY): Payer: 59

## 2014-11-03 LAB — PROTIME-INR
INR: 1.05 (ref 0.00–1.49)
Prothrombin Time: 13.9 seconds (ref 11.6–15.2)

## 2014-11-03 LAB — GLUCOSE, CAPILLARY
GLUCOSE-CAPILLARY: 209 mg/dL — AB (ref 65–99)
GLUCOSE-CAPILLARY: 214 mg/dL — AB (ref 65–99)
GLUCOSE-CAPILLARY: 182 mg/dL — AB (ref 65–99)
Glucose-Capillary: 185 mg/dL — ABNORMAL HIGH (ref 65–99)

## 2014-11-03 LAB — CBC
HEMATOCRIT: 34.4 % — AB (ref 36.0–46.0)
Hemoglobin: 10.4 g/dL — ABNORMAL LOW (ref 12.0–15.0)
MCH: 23.9 pg — ABNORMAL LOW (ref 26.0–34.0)
MCHC: 30 g/dL (ref 30.0–36.0)
MCV: 79.8 fL (ref 78.0–100.0)
PLATELETS: 333 10*3/uL (ref 150–400)
RBC: 4.26 MIL/uL (ref 3.87–5.11)
WBC: 10.7 10*3/uL — AB (ref 4.0–10.5)

## 2014-11-03 LAB — HEPARIN LEVEL (UNFRACTIONATED): HEPARIN UNFRACTIONATED: 0.65 [IU]/mL (ref 0.30–0.70)

## 2014-11-03 MED ORDER — FENTANYL CITRATE (PF) 100 MCG/2ML IJ SOLN
INTRAMUSCULAR | Status: AC | PRN
  Administered 2014-11-03 (×2): 25 ug via INTRAVENOUS
  Administered 2014-11-03: 50 ug via INTRAVENOUS

## 2014-11-03 MED ORDER — IOHEXOL 300 MG/ML  SOLN: 25.0000 mL | Freq: Once | INTRAMUSCULAR | Status: DC | PRN

## 2014-11-03 MED ORDER — POLYETHYLENE GLYCOL 3350 17 G PO PACK
17.0000 g | PACK | Freq: Three times a day (TID) | ORAL | Status: DC
  Administered 2014-11-03: 17 g via ORAL
  Filled 2014-11-03: qty 1

## 2014-11-03 MED ORDER — IOHEXOL 300 MG/ML  SOLN
150.0000 mL | Freq: Once | INTRAMUSCULAR | Status: DC | PRN
  Administered 2014-11-03: 50 mL via INTRAVENOUS
  Filled 2014-11-03: qty 150

## 2014-11-03 MED ORDER — MIDAZOLAM HCL 2 MG/2ML IJ SOLN
INTRAMUSCULAR | Status: AC | PRN
  Administered 2014-11-03 (×2): 0.5 mg via INTRAVENOUS
  Administered 2014-11-03: 1 mg via INTRAVENOUS

## 2014-11-03 MED ORDER — HEPARIN (PORCINE) IN NACL 100-0.45 UNIT/ML-% IJ SOLN
1750.0000 [IU]/h | INTRAMUSCULAR | Status: AC
  Administered 2014-11-03: 1750 [IU]/h via INTRAVENOUS
  Filled 2014-11-03: qty 250

## 2014-11-03 MED ORDER — LIDOCAINE HCL 1 % IJ SOLN
INTRAMUSCULAR | Status: AC
  Filled 2014-11-03: qty 20

## 2014-11-03 MED ORDER — MIDAZOLAM HCL 2 MG/2ML IJ SOLN
INTRAMUSCULAR | Status: AC
  Filled 2014-11-03: qty 2

## 2014-11-03 MED ORDER — POLYETHYLENE GLYCOL 3350 17 GM/SCOOP PO POWD
0.5000 | Freq: Once | ORAL | Status: AC
  Administered 2014-11-03: 127.5 g via ORAL
  Filled 2014-11-03: qty 255

## 2014-11-03 MED ORDER — FENTANYL CITRATE (PF) 100 MCG/2ML IJ SOLN
INTRAMUSCULAR | Status: AC
  Filled 2014-11-03: qty 2

## 2014-11-03 NOTE — Progress Notes (Signed)
Physician  Progress note     Marjo Grosvenor MRN: [id number] DOB/AGE: [birth year] 54 y.o.  PCP: Josem Kaufmann, MD   Admit date: 10/25/2014 Discharge date: Pending  Active diagnoses    Principal Problem:   Acute pulmonary embolism Active Problems:   Diabetes   Hypertension   GERD (gastroesophageal reflux disease)   Pulmonary emboli   Cecal lesion   Anxiety state   Iron deficiency anemia  status post EGD/colonoscopy Cecal mass       HPI :*54 y.o. female  With PMhx of HTN, DM, obesity.  Sent to [facility] around July 15th by her PCP after Hgb was found to be low. She has fatigue, SOB, and pica. PCP wanted a GI evaluation (UGD and colonoscopy). Patient was transfused and sent for an outpatient GI. She continued to feel poorly. Her daughter was in a car accident and patient drove 10 hours with a friend to check on her. She had a cough during that time and SOB. When patient returned, she saw her PCP who gave her azithromycin with no improvement.  Today, she presented to [facility] with continued SOB, fatigue.  At [facility], she was found to have a PE on CTA with acute heart strain. She also received a CT Scan of abd as her hgb was low at [employer] and was found to have cecal wall thickening with right mesenteric lymph node-- gastroenterology recommended EGD/colonoscopy  Subjective Patient unable to have a BM for several days but started complaining of acute abdominal pain yesterday, KUB concerning for small bowel obstruction  HOSPITAL COURSE:  Acute pulmonary embolism without suggestion of right heart strain Patient's risk factors include her recent road trip to [place]. In the setting of malignancy. Patient is currently hemodynamically stable. She was tachycardic on presentation. Heart rate is improved. Currently on a IV heparin drip pending further management decisions. Venous Doppler study shows superficial thrombosis of right lesser saphenous vein. Left Baker's cyst  noted. Echocardiogram showed No evidence for cor pulmonale or RV dysfunction on echocardiogram.  Continue heparin drip pending surgery tomorrow, start oral anticoagulation with coumadin post surgery.  She will also need a 5 day overlap with subcutaneous Lovenox until her INR is greater than 2.0  She will need anticoagulation for 3-6 months or as determined by heme-onc  Patient was evaluated by pulmonology and cleared to undergo GI procedure in the setting of her acute PE. Pulmonary recommended placement of IVC filter due to concern for epidural clot burden and withholding systemic anticoagulation at the time of surgery. Patient had IVC filter placement on 8/9. Bilateral upper extremity venous Doppler negative. Surgery in the process of preparing patient for right colectomy tomorrow.   Constipation-continue Miralax, Colace,   Thickening of the cecum noted on CT scan Concerning for malignancy in view of recently discovered microcytic anemia. PATIENT IS STATUS POST EGD  AND COLONOSCOPY,  hemoglobin has been stable for several days  IMPRESSION: 1. Cecal mass, consistent with a malignant neoplasm, correlating to that seen on the CT scan 2. Left-sided diverticulosis..   Further imaging such as CT scans would be recommended by general surgery. Patient will need right colectomy Discussed with  pulmonary about the risk  Of undergoing abdominal surgery in the setting of recent PE, pulmonary also recommends Coumadin as treatment of choice long-term for patient's PE  , due to concern about the propensity of bleeding of the cecal mass. KUB shows obstructive pattern (likely at mass site), clinically she is not fully obstructed. Discussed with general  surgery     History of GERD Continue PPI. Status post EGD  History of diabetes mellitus type 2 without any complications Initiated on SSI, resume metformin at discharge  History of essential hypertension Continue monitoring blood pressure.  Microcytic  anemia secondary to iron deficiency. She was transfused about 2 weeks ago. Hemoglobin has been stable for several days. Her ferritin is 6. TIBC 528. Vitamin B-12 257. Continue B-12 supplementation  Morbid obesity Body mass index is 42.21 kg/(m^2).    Consults: Gastroenterology Pulmonary General  surgery    Significant Diagnostic Studies:  Ir Ivc Filter Plmt / S&i /img Guid/mod Sed  11/03/2014   INDICATION: History of pulmonary embolism and cecal mass. Patient with impending colonic surgery and as such, request made for placement of a IVC filter for postoperative caval interruption.  EXAM: ULTRASOUND GUIDANCE FOR VASCULAR ACCESS  IVC CATHETERIZATION AND VENOGRAM  IVC FILTER INSERTION  COMPARISON:  CT the chest, abdomen and pelvis - 10/25/2014  MEDICATIONS: Fentanyl 100 mcg IV; Versed 2 mg IV  ANESTHESIA/SEDATION: Sedation Time  10 minutes  CONTRAST:  38m OMNIPAQUE IOHEXOL 300 MG/ML  SOLN  FLUOROSCOPY TIME:  1 minute, 24 seconds  COMPLICATIONS: None immediate  PROCEDURE: Informed consent was obtained from the patient following explanation of the procedure, risks, benefits and alternatives. The patient understands, agrees and consents for the procedure. All questions were addressed. A time out was performed prior to the initiation of the procedure.  Maximal barrier sterile technique utilized including caps, mask, sterile gowns, sterile gloves, large sterile drape, hand hygiene, and Betadine prep.  Under sterile condition and local anesthesia, right internal jugular venous access was performed with ultrasound. An ultrasound image was saved and sent to PACS. Over a guidewire, the IVC filter delivery sheath and inner dilator were advanced into the IVC just above the IVC bifurcation. Contrast injection was performed for an IVC venogram.  Through the delivery sheath, a retrievable Denali IVC filter was deployed below the level of the renal veins and above the IVC bifurcation. Limited post deployment  venacavagram was performed.  The delivery sheath was removed and hemostasis was obtained with manual compression. A dressing was placed. The patient tolerated the procedure well without immediate post procedural complication.  FINDINGS: The IVC is patent. No evidence of thrombus, stenosis, or occlusion. No variant venous anatomy. Successful placement of the IVC filter below the level of the renal veins.  IMPRESSION: Successful ultrasound and fluoroscopically guided placement of an infrarenal retrievable IVC filter via right jugular approach.  This IVC filter is potentially retrievable. The patient will be assessed for filter retrieval by Interventional Radiology in approximately 8-12 weeks. Further recommendations regarding filter retrieval, continued surveillance or declaration of device permanence, will be made at that time.   Electronically Signed   By: JSandi MariscalM.D.   On: 11/03/2014 11:05   Dg Chest Port 1 View  10/25/2014   CLINICAL DATA:  Shortness of breath, PE  EXAM: PORTABLE CHEST - 1 VIEW  COMPARISON:  None.  FINDINGS: Lungs are clear.  No pleural effusion or pneumothorax.  The heart is normal in size.  IMPRESSION: No evidence of acute cardiopulmonary disease.   Electronically Signed   By: SJulian HyM.D.   On: 10/25/2014 18:20   Dg Abd Portable 1v  11/02/2014   CLINICAL DATA:  54year old female with acute abdominal pain. Initial encounter.  EXAM: PORTABLE ABDOMEN - 1 VIEW  COMPARISON:  None.  FINDINGS: Gas distended proximal and mid small bowel loops with  paucity of distal small bowel gas suggestive of small bowel obstruction.  The possibility of free intraperitoneal air cannot be assessed on a supine view.  Tubal ligation clips are in place.  IMPRESSION: Gas distended proximal and mid small bowel loops with paucity of distal small bowel gas suggestive of small bowel obstruction.  The possibility of free intraperitoneal air cannot be assessed on a supine view.  These results will be called  to the ordering clinician or representative by the Radiologist Assistant, and communication documented in the PACS or zVision Dashboard.   Electronically Signed   By: Genia Del M.D.   On: 11/02/2014 18:03     echocardiogram EF 60-65% Left ventricle: The cavity size was normal. Wall thickness was normal. Systolic function was normal. The estimated ejection fraction was in the range of 60% to 65%. Wall motion was normal; there were no regional wall motion abnormalities. - Left atrium: The atrium was mildly dilated.    Filed Weights   10/31/14 0610 11/01/14 0500 11/03/14 0515  Weight: 110.95 kg (244 lb 9.6 oz) 109.907 kg (242 lb 4.8 oz) 111.585 kg (246 lb)     Microbiology: Recent Results (from the past 240 hour(s))  MRSA PCR Screening     Status: None   Collection Time: 10/25/14  5:08 PM  Result Value Ref Range Status   MRSA by PCR NEGATIVE NEGATIVE Final    Comment:        The GeneXpert MRSA Assay (FDA approved for NASAL specimens only), is one component of a comprehensive MRSA colonization surveillance program. It is not intended to diagnose MRSA infection nor to guide or monitor treatment for MRSA infections.        Blood Culture No results found for: SDES, SPECREQUEST, CULT, REPTSTATUS    Labs: Results for orders placed or performed during the hospital encounter of 10/25/14 (from the past 48 hour(s))  Glucose, capillary     Status: Abnormal   Collection Time: 11/01/14  4:46 PM  Result Value Ref Range   Glucose-Capillary 154 (H) 65 - 99 mg/dL  Glucose, capillary     Status: Abnormal   Collection Time: 11/01/14  8:00 PM  Result Value Ref Range   Glucose-Capillary 254 (H) 65 - 99 mg/dL   Comment 1 Notify RN   Heparin level (unfractionated)     Status: None   Collection Time: 11/02/14  4:49 AM  Result Value Ref Range   Heparin Unfractionated 0.66 0.30 - 0.70 IU/mL    Comment:        IF HEPARIN RESULTS ARE BELOW EXPECTED VALUES, AND PATIENT DOSAGE  HAS BEEN CONFIRMED, SUGGEST FOLLOW UP TESTING OF ANTITHROMBIN III LEVELS.   CBC     Status: Abnormal   Collection Time: 11/02/14  4:49 AM  Result Value Ref Range   WBC 7.5 4.0 - 10.5 K/uL   RBC 4.17 3.87 - 5.11 MIL/uL   Hemoglobin 10.0 (L) 12.0 - 15.0 g/dL    Comment: CONSISTENT WITH PREVIOUS RESULT   HCT 33.7 (L) 36.0 - 46.0 %   MCV 80.8 78.0 - 100.0 fL   MCH 24.0 (L) 26.0 - 34.0 pg   MCHC 29.7 (L) 30.0 - 36.0 g/dL   RDW 28.7 (H) 11.5 - 15.5 %   Platelets 350 150 - 400 K/uL  Comprehensive metabolic panel     Status: Abnormal   Collection Time: 11/02/14  4:49 AM  Result Value Ref Range   Sodium 136 135 - 145 mmol/L   Potassium 4.3  3.5 - 5.1 mmol/L   Chloride 104 101 - 111 mmol/L   CO2 23 22 - 32 mmol/L   Glucose, Bld 193 (H) 65 - 99 mg/dL   BUN 9 6 - 20 mg/dL   Creatinine, Ser 1.00 0.44 - 1.00 mg/dL   Calcium 9.0 8.9 - 10.3 mg/dL   Total Protein 6.3 (L) 6.5 - 8.1 g/dL   Albumin 3.0 (L) 3.5 - 5.0 g/dL   AST 30 15 - 41 U/L   ALT 19 14 - 54 U/L   Alkaline Phosphatase 73 38 - 126 U/L   Total Bilirubin 0.4 0.3 - 1.2 mg/dL   GFR calc non Af Amer >60 >60 mL/min   GFR calc Af Amer >60 >60 mL/min    Comment: (NOTE) The eGFR has been calculated using the CKD EPI equation. This calculation has not been validated in all clinical situations. eGFR's persistently <60 mL/min signify possible Chronic Kidney Disease.    Anion gap 9 5 - 15  Glucose, capillary     Status: Abnormal   Collection Time: 11/02/14  7:32 AM  Result Value Ref Range   Glucose-Capillary 180 (H) 65 - 99 mg/dL  Glucose, capillary     Status: Abnormal   Collection Time: 11/02/14 11:49 AM  Result Value Ref Range   Glucose-Capillary 202 (H) 65 - 99 mg/dL  Glucose, capillary     Status: Abnormal   Collection Time: 11/02/14  4:23 PM  Result Value Ref Range   Glucose-Capillary 142 (H) 65 - 99 mg/dL  Glucose, capillary     Status: Abnormal   Collection Time: 11/02/14  8:53 PM  Result Value Ref Range    Glucose-Capillary 180 (H) 65 - 99 mg/dL  Heparin level (unfractionated)     Status: None   Collection Time: 11/03/14  5:33 AM  Result Value Ref Range   Heparin Unfractionated 0.65 0.30 - 0.70 IU/mL    Comment:        IF HEPARIN RESULTS ARE BELOW EXPECTED VALUES, AND PATIENT DOSAGE HAS BEEN CONFIRMED, SUGGEST FOLLOW UP TESTING OF ANTITHROMBIN III LEVELS.   CBC     Status: Abnormal   Collection Time: 11/03/14  5:33 AM  Result Value Ref Range   WBC 10.7 (H) 4.0 - 10.5 K/uL   RBC 4.26 3.87 - 5.11 MIL/uL   Hemoglobin 10.4 (L) 12.0 - 15.0 g/dL   HCT 34.4 (L) 36.0 - 46.0 %   MCV 79.8 78.0 - 100.0 fL   MCH 23.9 (L) 26.0 - 34.0 pg   MCHC 30.0 30.0 - 36.0 g/dL   RDW NOT CALCULATED 11.5 - 15.5 %   Platelets 333 150 - 400 K/uL  Protime-INR     Status: None   Collection Time: 11/03/14  5:33 AM  Result Value Ref Range   Prothrombin Time 13.9 11.6 - 15.2 seconds   INR 1.05 0.00 - 1.49  Glucose, capillary     Status: Abnormal   Collection Time: 11/03/14  7:20 AM  Result Value Ref Range   Glucose-Capillary 209 (H) 65 - 99 mg/dL  Glucose, capillary     Status: Abnormal   Collection Time: 11/03/14 11:27 AM  Result Value Ref Range   Glucose-Capillary 214 (H) 65 - 99 mg/dL     Lipid Panel  No results found for: CHOL, TRIG, HDL, CHOLHDL, VLDL, LDLCALC, LDLDIRECT   No results found for: HGBA1C   Lab Results  Component Value Date   CREATININE 1.00 11/02/2014       Physical  Exam:    Blood pressure 110/60, pulse 97, temperature 98.9 F (37.2 C), temperature source Oral, resp. rate 15, height '5\' 4"'  (1.626 m), weight 111.585 kg (246 lb), SpO2 91 %. General appearance: alert, cooperative, appears stated age, no distress and morbidly obese Resp: Coarse breath sounds bilaterally with no wheezing, rales or rhonchi. Cardio: regular rate and rhythm, S1, S2 normal, no murmur, click, rub or gallop GI: Soft. Vague discomfort in the upper abdomen without any clear-cut rebound, rigidity or  guarding. No masses or organomegaly. Bowel sounds present. Extremities: Nonpitting swelling noted in both lower extremities. Neurologic: No focal deficits    Discharge Instructions    Diet - low sodium heart healthy    Complete by:  As directed      Increase activity slowly    Complete by:  As directed            Follow-up Information    Follow up with [facility], MD. Schedule an appointment as soon as possible for a visit in 3 days.   Specialty:  Family Medicine   Contact information:   [place] [id number] [phone]       Signed: Reyne Dumas 11/03/2014, 11:38 AM        Time spent >45 mins

## 2014-11-03 NOTE — Progress Notes (Signed)
[facility] Progress Note  4 Days Post-Op  Subjective: Says she's felt bed the last 2 days.  Last night she threw up water and grapes around 8pm.  No N/V since.  Feels better this am.  Still has pain in RUQ/RLQ.  Having some flatus.  No BM's yet since re-prep started.  Dr.Abrol had ordered CT scan so she's already drank 1.25 bottles of contrast and tolerated that well.  Objective: Vital signs in last 24 hours: Temp:  [98.7 F (37.1 C)-99.4 F (37.4 C)] 98.9 F (37.2 C) (08/09 0515) Pulse Rate:  [92-107] 97 (08/09 0907) Resp:  [15-25] 15 (08/09 0907) BP: (106-151)/(59-87) 110/60 mmHg (08/09 0907) SpO2:  [91 %-100 %] 91 % (08/09 0907) Weight:  [111.585 kg (246 lb)] 111.585 kg (246 lb) (08/09 0515) Last BM Date: 10/30/14  Intake/Output from previous day: 08/08 0701 - 08/09 0700 In: 480 [P.O.:480] Out: -  Intake/Output this shift:    PE: Gen:  Alert, NAD, pleasant Abd: Soft, mild distension, tenderness in RUQ and RLQ, +BS, no HSM   Lab Results:   Recent Labs  11/02/14 0449 11/03/14 0533  WBC 7.5 10.7*  HGB 10.0* 10.4*  HCT 33.7* 34.4*  PLT 350 333   BMET  Recent Labs  11/02/14 0449  NA 136  K 4.3  CL 104  CO2 23  GLUCOSE 193*  BUN 9  CREATININE 1.00  CALCIUM 9.0   PT/INR  Recent Labs  11/03/14 0533  LABPROT 13.9  INR 1.05   CMP     Component Value Date/Time   NA 136 11/02/2014 0449   K 4.3 11/02/2014 0449   CL 104 11/02/2014 0449   CO2 23 11/02/2014 0449   GLUCOSE 193* 11/02/2014 0449   BUN 9 11/02/2014 0449   CREATININE 1.00 11/02/2014 0449   CALCIUM 9.0 11/02/2014 0449   PROT 6.3* 11/02/2014 0449   ALBUMIN 3.0* 11/02/2014 0449   AST 30 11/02/2014 0449   ALT 19 11/02/2014 0449   ALKPHOS 73 11/02/2014 0449   BILITOT 0.4 11/02/2014 0449   GFRNONAA >60 11/02/2014 0449   GFRAA >60 11/02/2014 0449   Lipase     Component Value Date/Time   LIPASE 36 10/05/2009 1910     Studies/Results: Dg Abd Portable 1v  11/02/2014    CLINICAL DATA:  54 year old female with acute abdominal pain. Initial encounter.  EXAM: PORTABLE ABDOMEN - 1 VIEW  COMPARISON:  None.  FINDINGS: Gas distended proximal and mid small bowel loops with paucity of distal small bowel gas suggestive of small bowel obstruction.  The possibility of free intraperitoneal air cannot be assessed on a supine view.  Tubal ligation clips are in place.  IMPRESSION: Gas distended proximal and mid small bowel loops with paucity of distal small bowel gas suggestive of small bowel obstruction.  The possibility of free intraperitoneal air cannot be assessed on a supine view.  These results will be called to the ordering clinician or representative by the Radiologist Assistant, and communication documented in the PACS or zVision Dashboard.   Electronically Signed   By: Genia Del M.D.   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  Tentatively 8:30am.  IVC filter in place. Continue miralax gentle prep (package TID) as tolerated. -Pulmonology has cleared her for surgery -I have decreased her diet back to clear liquids as  she is not tolerating a solid diet at this time. -Colonoscopy biopsies back and shows invasive adenocarcinoma as suspected -Hold off on CT scan, she is feeling better and tolerating CT contrast so she should tolerate prep.  Her KUB shows obstructive pattern (likely at mass site), clinically she is not fully obstructed.  Can have clears as tolerated, but prep is first priority. -NPO MN -Hold heparin after MN  Pulmonary embolism ?hypercoaguable state from cancer vs long car ride -On heparin, Elliquis on hold right now Microcytic anemia secondary to iron deficiency/ABL anemia -s/p 2 units pRBC at [facility] 2 weeks ago DM/HLD/HTN/Obesity BMI 42    LOS: 9 days    Nat Christen 11/03/2014,  10:34 AM Pager: [phone]

## 2014-11-03 NOTE — Sedation Documentation (Signed)
Patient is resting comfortably. 

## 2014-11-03 NOTE — Sedation Documentation (Signed)
Patient c/o pain.  

## 2014-11-03 NOTE — Procedures (Signed)
Successful placement of an infrarenal IVC filter.  No immediate complications.  

## 2014-11-03 NOTE — Progress Notes (Signed)
ANTICOAGULATION CONSULT NOTE - Follow Up Consult  Pharmacy Consult for Heparin  Indication: pulmonary embolus  No Known Allergies  Patient Measurements: Height: 5\' 4"  (162.6 cm) Weight: 246 lb (111.585 kg) IBW/kg (Calculated) : 54.7  Vital Signs: Temp: 98.9 F (37.2 C) (08/09 0515) Temp Source: Oral (08/09 0515) BP: 110/60 mmHg (08/09 0907) Pulse Rate: 97 (08/09 0907)  Labs:  Recent Labs  11/01/14 0347 11/02/14 0449 11/03/14 0533  HGB 10.3* 10.0* 10.4*  HCT 35.1* 33.7* 34.4*  PLT 389 350 333  LABPROT  --   --  13.9  INR  --   --  1.05  HEPARINUNFRC 0.55 0.66 0.65  CREATININE  --  1.00  --     Estimated Creatinine Clearance: 78.7 mL/min (by C-G formula based on Cr of 1).   Assessment: 54 yo F initiated on heparin IV for PE. Heparin level remains therapeutic today at 0.66, Plt 389, Hgb 10.3 stable, no reports of bleeding noted in pt chart.  Underwent colonoscopy on 8/5 that revealed a cecal mass, several biopsies taken. Pt is s/p IVC filter placement today with plans for surgery 8/10.  RN confirmed with Dr. Pascal Lux (IR) to restart heparin at 1pm today.  Surgery has ordered that heparin will stop at midnight.  Goal of Therapy:  Heparin level 0.3-0.7 units/ml Monitor platelets by anticoagulation protocol: Yes   Plan:  Heparin to resume at 1pm at 1750 units/hr.  Stop time of midnight. Daily CBC/HL Monitor for bleeding Follow up long-term AC plan  [employer], Pharm.D., BCPS Clinical Pharmacist Pager [phone] 11/03/2014 12:28 PM

## 2014-11-04 ENCOUNTER — Inpatient Hospital Stay (HOSPITAL_COMMUNITY): Payer: 59 | Admitting: Anesthesiology

## 2014-11-04 ENCOUNTER — Encounter (HOSPITAL_COMMUNITY)
Admission: AD | Disposition: A | Discharge status: Home / Self Care | Payer: Self-pay | Source: Other Acute Inpatient Hospital | Attending: Internal Medicine

## 2014-11-04 ENCOUNTER — Encounter (HOSPITAL_COMMUNITY): Payer: Self-pay | Admitting: Certified Registered"

## 2014-11-04 HISTORY — PX: LAPAROSCOPIC PARTIAL COLECTOMY: SHX5907

## 2014-11-04 LAB — CBC
HCT: 32 % — ABNORMAL LOW (ref 36.0–46.0)
Hemoglobin: 9.7 g/dL — ABNORMAL LOW (ref 12.0–15.0)
MCH: 24.3 pg — AB (ref 26.0–34.0)
MCHC: 30.3 g/dL (ref 30.0–36.0)
MCV: 80.2 fL (ref 78.0–100.0)
PLATELETS: 307 10*3/uL (ref 150–400)
RBC: 3.99 MIL/uL (ref 3.87–5.11)
WBC: 8.3 10*3/uL (ref 4.0–10.5)

## 2014-11-04 LAB — SURGICAL PCR SCREEN
MRSA, PCR: NEGATIVE
Staphylococcus aureus: NEGATIVE

## 2014-11-04 LAB — GLUCOSE, CAPILLARY
GLUCOSE-CAPILLARY: 212 mg/dL — AB (ref 65–99)
GLUCOSE-CAPILLARY: 192 mg/dL — AB (ref 65–99)
Glucose-Capillary: 226 mg/dL — ABNORMAL HIGH (ref 65–99)
Glucose-Capillary: 268 mg/dL — ABNORMAL HIGH (ref 65–99)
Glucose-Capillary: 124 mg/dL — ABNORMAL HIGH (ref 65–99)

## 2014-11-04 LAB — BASIC METABOLIC PANEL
ANION GAP: 9 (ref 5–15)
BUN: 7 mg/dL (ref 6–20)
CALCIUM: 8.9 mg/dL (ref 8.9–10.3)
CO2: 26 mmol/L (ref 22–32)
CREATININE: 1.02 mg/dL — AB (ref 0.44–1.00)
Chloride: 99 mmol/L — ABNORMAL LOW (ref 101–111)
GFR calc Af Amer: >60 mL/min (ref >60)
GLUCOSE: 192 mg/dL — AB (ref 65–99)
POTASSIUM: 3.8 mmol/L (ref 3.5–5.1)
Sodium: 134 mmol/L — ABNORMAL LOW (ref 135–145)

## 2014-11-04 LAB — HEPARIN LEVEL (UNFRACTIONATED): Heparin Unfractionated: <0.1 IU/mL — ABNORMAL LOW (ref 0.30–0.70)

## 2014-11-04 SURGERY — LAPAROSCOPIC PARTIAL COLECTOMY
Anesthesia: General

## 2014-11-04 MED ORDER — ONDANSETRON HCL 4 MG/2ML IJ SOLN: 4.0000 mg | Freq: Once | INTRAMUSCULAR | Status: DC | PRN

## 2014-11-04 MED ORDER — SODIUM CHLORIDE 0.9 % IJ SOLN: 9.0000 mL | INTRAMUSCULAR | Status: DC | PRN

## 2014-11-04 MED ORDER — ONDANSETRON HCL 4 MG/2ML IJ SOLN
INTRAMUSCULAR | Status: DC | PRN
  Administered 2014-11-04: 4 mg via INTRAVENOUS

## 2014-11-04 MED ORDER — POTASSIUM CHLORIDE IN NACL 20-0.9 MEQ/L-% IV SOLN
INTRAVENOUS | Status: DC
  Administered 2014-11-04 – 2014-11-05 (×3): via INTRAVENOUS
  Administered 2014-11-07: 100 mL/h via INTRAVENOUS
  Administered 2014-11-07: 100 mL via INTRAVENOUS
  Administered 2014-11-08 – 2014-11-09 (×3): via INTRAVENOUS
  Filled 2014-11-04 (×18): qty 1000

## 2014-11-04 MED ORDER — BUPIVACAINE-EPINEPHRINE (PF) 0.25% -1:200000 IJ SOLN
INTRAMUSCULAR | Status: AC
  Filled 2014-11-04: qty 30

## 2014-11-04 MED ORDER — BUPIVACAINE-EPINEPHRINE (PF) 0.25% -1:200000 IJ SOLN
INTRAMUSCULAR | Status: DC | PRN
  Administered 2014-11-04: 4 mL

## 2014-11-04 MED ORDER — FENTANYL CITRATE (PF) 100 MCG/2ML IJ SOLN
INTRAMUSCULAR | Status: DC | PRN
  Administered 2014-11-04 (×2): 100 ug via INTRAVENOUS
  Administered 2014-11-04: 50 ug via INTRAVENOUS
  Administered 2014-11-04: 100 ug via INTRAVENOUS
  Administered 2014-11-04: 150 ug via INTRAVENOUS

## 2014-11-04 MED ORDER — SUCCINYLCHOLINE CHLORIDE 20 MG/ML IJ SOLN
INTRAMUSCULAR | Status: AC
  Filled 2014-11-04: qty 1

## 2014-11-04 MED ORDER — HYDROMORPHONE HCL 1 MG/ML IJ SOLN
INTRAMUSCULAR | Status: AC
  Administered 2014-11-04: 0.5 mg via INTRAVENOUS
  Filled 2014-11-04: qty 2

## 2014-11-04 MED ORDER — LIDOCAINE HCL (CARDIAC) 20 MG/ML IV SOLN
INTRAVENOUS | Status: DC | PRN
  Administered 2014-11-04: 100 mg via INTRAVENOUS

## 2014-11-04 MED ORDER — CHLORHEXIDINE GLUCONATE CLOTH 2 % EX PADS
6.0000 | MEDICATED_PAD | Freq: Every day | CUTANEOUS | Status: DC
  Administered 2014-11-04 – 2014-11-10 (×5): 6 via TOPICAL

## 2014-11-04 MED ORDER — ROCURONIUM BROMIDE 100 MG/10ML IV SOLN
INTRAVENOUS | Status: DC | PRN
  Administered 2014-11-04: 50 mg via INTRAVENOUS
  Administered 2014-11-04: 20 mg via INTRAVENOUS

## 2014-11-04 MED ORDER — 0.9 % SODIUM CHLORIDE (POUR BTL) OPTIME
TOPICAL | Status: DC | PRN
  Administered 2014-11-04: 1000 mL

## 2014-11-04 MED ORDER — SUGAMMADEX SODIUM 200 MG/2ML IV SOLN
INTRAVENOUS | Status: DC | PRN
  Administered 2014-11-04: 200 mg via INTRAVENOUS

## 2014-11-04 MED ORDER — HYDROMORPHONE 0.3 MG/ML IV SOLN
INTRAVENOUS | Status: DC
  Administered 2014-11-04: 3.59 mg via INTRAVENOUS
  Administered 2014-11-04 (×2): via INTRAVENOUS
  Administered 2014-11-05: 4.59 mg via INTRAVENOUS
  Administered 2014-11-05: 0.3 mg via INTRAVENOUS
  Administered 2014-11-05: 3.99 mg via INTRAVENOUS
  Administered 2014-11-05: 2.59 mg via INTRAVENOUS
  Administered 2014-11-05: 1.99 mg via INTRAVENOUS
  Administered 2014-11-05: 06:00:00 via INTRAVENOUS
  Administered 2014-11-06: 0.799 mg via INTRAVENOUS
  Administered 2014-11-06: 1.59 mg via INTRAVENOUS
  Administered 2014-11-06: 7.5 mg via INTRAVENOUS
  Administered 2014-11-06: 4.59 mL via INTRAVENOUS
  Administered 2014-11-06: 4.19 mL via INTRAVENOUS
  Administered 2014-11-06: 05:00:00 via INTRAVENOUS
  Administered 2014-11-07: 2.79 mg via INTRAVENOUS
  Administered 2014-11-07: 0.93 mg via INTRAVENOUS
  Administered 2014-11-07: 0.67 mL via INTRAVENOUS
  Administered 2014-11-07: 3.6 mg via INTRAVENOUS
  Administered 2014-11-07: 3.33 mg via INTRAVENOUS
  Administered 2014-11-07 – 2014-11-08 (×2): 0.6 mg via INTRAVENOUS
  Administered 2014-11-08: 0.399 mg via INTRAVENOUS
  Administered 2014-11-08: 0.999 mg via INTRAVENOUS
  Filled 2014-11-04 (×6): qty 25

## 2014-11-04 MED ORDER — MEPERIDINE HCL 25 MG/ML IJ SOLN: 6.2500 mg | INTRAMUSCULAR | Status: DC | PRN

## 2014-11-04 MED ORDER — ONDANSETRON HCL 4 MG/2ML IJ SOLN
INTRAMUSCULAR | Status: AC
  Filled 2014-11-04: qty 2

## 2014-11-04 MED ORDER — SUGAMMADEX SODIUM 200 MG/2ML IV SOLN
INTRAVENOUS | Status: AC
  Filled 2014-11-04: qty 2

## 2014-11-04 MED ORDER — HYDROMORPHONE 0.3 MG/ML IV SOLN
INTRAVENOUS | Status: AC
  Filled 2014-11-04: qty 25

## 2014-11-04 MED ORDER — PROPOFOL 10 MG/ML IV BOLUS
INTRAVENOUS | Status: AC
Start: 2014-11-04 — End: 2014-11-04
  Filled 2014-11-04: qty 20

## 2014-11-04 MED ORDER — LACTATED RINGERS IV SOLN
INTRAVENOUS | Status: DC | PRN
  Administered 2014-11-04 (×2): via INTRAVENOUS

## 2014-11-04 MED ORDER — DEXTROSE 5 % IV SOLN
2.0000 g | Freq: Four times a day (QID) | INTRAVENOUS | Status: DC
  Filled 2014-11-04: qty 2

## 2014-11-04 MED ORDER — DIPHENHYDRAMINE HCL 50 MG/ML IJ SOLN: 12.5000 mg | Freq: Four times a day (QID) | INTRAMUSCULAR | Status: DC | PRN

## 2014-11-04 MED ORDER — ROCURONIUM BROMIDE 50 MG/5ML IV SOLN
INTRAVENOUS | Status: AC
  Filled 2014-11-04: qty 1

## 2014-11-04 MED ORDER — SODIUM CHLORIDE 0.9 % IR SOLN
Status: DC | PRN
  Administered 2014-11-04: 1000 mL

## 2014-11-04 MED ORDER — LIDOCAINE HCL (CARDIAC) 20 MG/ML IV SOLN
INTRAVENOUS | Status: AC
  Filled 2014-11-04: qty 5

## 2014-11-04 MED ORDER — HYDROMORPHONE HCL 1 MG/ML IJ SOLN
0.2500 mg | INTRAMUSCULAR | Status: DC | PRN
  Administered 2014-11-04 (×4): 0.5 mg via INTRAVENOUS

## 2014-11-04 MED ORDER — MIDAZOLAM HCL 2 MG/2ML IJ SOLN
INTRAMUSCULAR | Status: AC
  Filled 2014-11-04: qty 4

## 2014-11-04 MED ORDER — PHENYLEPHRINE HCL 10 MG/ML IJ SOLN
INTRAMUSCULAR | Status: DC | PRN
  Administered 2014-11-04: 80 ug via INTRAVENOUS

## 2014-11-04 MED ORDER — HYDROMORPHONE HCL 1 MG/ML IJ SOLN
INTRAMUSCULAR | Status: AC
  Filled 2014-11-04: qty 1

## 2014-11-04 MED ORDER — FENTANYL CITRATE (PF) 250 MCG/5ML IJ SOLN
INTRAMUSCULAR | Status: AC
  Filled 2014-11-04: qty 5

## 2014-11-04 MED ORDER — MIDAZOLAM HCL 5 MG/5ML IJ SOLN
INTRAMUSCULAR | Status: DC | PRN
  Administered 2014-11-04: 2 mg via INTRAVENOUS

## 2014-11-04 MED ORDER — HYDROMORPHONE HCL 1 MG/ML IJ SOLN
INTRAMUSCULAR | Status: DC | PRN
  Administered 2014-11-04: 1 mg via INTRAVENOUS

## 2014-11-04 MED ORDER — NALOXONE HCL 0.4 MG/ML IJ SOLN: 0.4000 mg | INTRAMUSCULAR | Status: DC | PRN

## 2014-11-04 MED ORDER — PROPOFOL 10 MG/ML IV BOLUS
INTRAVENOUS | Status: DC | PRN
  Administered 2014-11-04: 120 mg via INTRAVENOUS

## 2014-11-04 MED ORDER — DEXTROSE 5 % IV SOLN
2.0000 g | INTRAVENOUS | Status: DC | PRN
  Administered 2014-11-04: 2 g via INTRAVENOUS

## 2014-11-04 MED ORDER — DEXTROSE 5 % IV SOLN
2.0000 g | INTRAVENOUS | Status: DC
  Filled 2014-11-04: qty 2

## 2014-11-04 MED ORDER — DIPHENHYDRAMINE HCL 12.5 MG/5ML PO ELIX
12.5000 mg | ORAL_SOLUTION | Freq: Four times a day (QID) | ORAL | Status: DC | PRN
  Filled 2014-11-04: qty 5

## 2014-11-04 MED ORDER — ONDANSETRON HCL 4 MG/2ML IJ SOLN
4.0000 mg | Freq: Four times a day (QID) | INTRAMUSCULAR | Status: DC | PRN
  Administered 2014-11-06 – 2014-11-08 (×4): 4 mg via INTRAVENOUS
  Filled 2014-11-04 (×4): qty 2

## 2014-11-04 SURGICAL SUPPLY — 80 items
APPLIER CLIP 5 13 M/L LIGAMAX5 (MISCELLANEOUS)
APPLIER CLIP ROT 10 11.4 M/L (STAPLE)
BLADE 10 SAFETY STRL DISP (BLADE) ×3 IMPLANT
BLADE SURG ROTATE 9660 (MISCELLANEOUS) IMPLANT
CANISTER SUCTION 2500CC (MISCELLANEOUS) ×3 IMPLANT
CELLS DAT CNTRL 66122 CELL SVR (MISCELLANEOUS) IMPLANT
CHLORAPREP W/TINT 26ML (MISCELLANEOUS) ×3 IMPLANT
CLIP APPLIE 5 13 M/L LIGAMAX5 (MISCELLANEOUS) IMPLANT
CLIP APPLIE ROT 10 11.4 M/L (STAPLE) IMPLANT
COVER MAYO STAND STRL (DRAPES) ×3 IMPLANT
COVER SURGICAL LIGHT HANDLE (MISCELLANEOUS) ×6 IMPLANT
DRAPE PROXIMA HALF (DRAPES) IMPLANT
DRAPE UTILITY XL STRL (DRAPES) ×15 IMPLANT
DRAPE WARM FLUID 44X44 (DRAPE) ×3 IMPLANT
DRSG OPSITE POSTOP 4X10 (GAUZE/BANDAGES/DRESSINGS) IMPLANT
DRSG OPSITE POSTOP 4X6 (GAUZE/BANDAGES/DRESSINGS) ×3 IMPLANT
DRSG OPSITE POSTOP 4X8 (GAUZE/BANDAGES/DRESSINGS) IMPLANT
ELECT BLADE 6.5 EXT (BLADE) ×3 IMPLANT
ELECT CAUTERY BLADE 6.4 (BLADE) ×6 IMPLANT
ELECT REM PT RETURN 9FT ADLT (ELECTROSURGICAL) ×3
ELECTRODE REM PT RTRN 9FT ADLT (ELECTROSURGICAL) ×1 IMPLANT
GAUZE SPONGE 2X2 8PLY STRL LF (GAUZE/BANDAGES/DRESSINGS) ×1 IMPLANT
GEL ULTRASOUND 20GR AQUASONIC (MISCELLANEOUS) IMPLANT
GLOVE BIO SURGEON STRL SZ7 (GLOVE) ×3 IMPLANT
GLOVE BIO SURGEON STRL SZ8 (GLOVE) ×6 IMPLANT
GLOVE BIOGEL PI IND STRL 6.5 (GLOVE) ×3 IMPLANT
GLOVE BIOGEL PI IND STRL 7.0 (GLOVE) ×2 IMPLANT
GLOVE BIOGEL PI IND STRL 8 (GLOVE) ×2 IMPLANT
GLOVE BIOGEL PI INDICATOR 6.5 (GLOVE) ×6
GLOVE BIOGEL PI INDICATOR 7.0 (GLOVE) ×4
GLOVE BIOGEL PI INDICATOR 8 (GLOVE) ×4
GLOVE SURG SS PI 7.0 STRL IVOR (GLOVE) ×3 IMPLANT
GOWN STRL REUS W/ TWL LRG LVL3 (GOWN DISPOSABLE) ×6 IMPLANT
GOWN STRL REUS W/ TWL XL LVL3 (GOWN DISPOSABLE) ×2 IMPLANT
GOWN STRL REUS W/TWL LRG LVL3 (GOWN DISPOSABLE) ×12
GOWN STRL REUS W/TWL XL LVL3 (GOWN DISPOSABLE) ×4
KIT BASIN OR (CUSTOM PROCEDURE TRAY) ×3 IMPLANT
KIT ROOM TURNOVER OR (KITS) ×3 IMPLANT
LEGGING LITHOTOMY PAIR STRL (DRAPES) IMPLANT
LIGASURE IMPACT 36 18CM CVD LR (INSTRUMENTS) ×3 IMPLANT
NS IRRIG 1000ML POUR BTL (IV SOLUTION) ×6 IMPLANT
PAD ARMBOARD 7.5X6 YLW CONV (MISCELLANEOUS) ×6 IMPLANT
PENCIL BUTTON HOLSTER BLD 10FT (ELECTRODE) ×6 IMPLANT
RELOAD PROXIMATE 75MM BLUE (ENDOMECHANICALS) ×6 IMPLANT
RTRCTR WOUND ALEXIS 18CM MED (MISCELLANEOUS)
SCALPEL HARMONIC ACE (MISCELLANEOUS) ×3 IMPLANT
SCISSORS LAP 5X35 DISP (ENDOMECHANICALS) ×3 IMPLANT
SET IRRIG TUBING LAPAROSCOPIC (IRRIGATION / IRRIGATOR) IMPLANT
SLEEVE ENDOPATH XCEL 5M (ENDOMECHANICALS) ×3 IMPLANT
SPECIMEN JAR LARGE (MISCELLANEOUS) ×3 IMPLANT
SPONGE GAUZE 2X2 STER 10/PKG (GAUZE/BANDAGES/DRESSINGS) ×2
STAPLER GUN LINEAR PROX 60 (STAPLE) ×3 IMPLANT
STAPLER PROXIMATE 75MM BLUE (STAPLE) ×3 IMPLANT
STAPLER VISISTAT 35W (STAPLE) ×3 IMPLANT
SUCTION POOLE TIP (SUCTIONS) ×3 IMPLANT
SURGILUBE 2OZ TUBE FLIPTOP (MISCELLANEOUS) IMPLANT
SUT PDS AB 1 CTX 36 (SUTURE) ×9 IMPLANT
SUT PROLENE 2 0 CT2 30 (SUTURE) IMPLANT
SUT PROLENE 2 0 KS (SUTURE) IMPLANT
SUT VIC AB 2-0 SH 18 (SUTURE) ×3 IMPLANT
SUT VIC AB 3-0 SH 18 (SUTURE) ×3 IMPLANT
SUT VICRYL AB 2 0 TIES (SUTURE) ×3 IMPLANT
SUT VICRYL AB 3 0 TIES (SUTURE) ×3 IMPLANT
SYR BULB IRRIGATION 50ML (SYRINGE) ×3 IMPLANT
SYS LAPSCP GELPORT 120MM (MISCELLANEOUS) ×3
SYSTEM LAPSCP GELPORT 120MM (MISCELLANEOUS) ×1 IMPLANT
TAPE CLOTH SURG 4X10 WHT LF (GAUZE/BANDAGES/DRESSINGS) ×3 IMPLANT
TOWEL OR 17X26 10 PK STRL BLUE (TOWEL DISPOSABLE) ×6 IMPLANT
TRAY FOLEY CATH 16FRSI W/METER (SET/KITS/TRAYS/PACK) ×3 IMPLANT
TRAY LAPAROSCOPIC MC (CUSTOM PROCEDURE TRAY) ×3 IMPLANT
TRAY PROCTOSCOPIC FIBER OPTIC (SET/KITS/TRAYS/PACK) IMPLANT
TROCAR XCEL 12X100 BLDLESS (ENDOMECHANICALS) IMPLANT
TROCAR XCEL BLUNT TIP 100MML (ENDOMECHANICALS) IMPLANT
TROCAR XCEL NON-BLD 11X100MML (ENDOMECHANICALS) IMPLANT
TROCAR XCEL NON-BLD 5MMX100MML (ENDOMECHANICALS) ×3 IMPLANT
TUBE CONNECTING 12'X1/4 (SUCTIONS) ×2
TUBE CONNECTING 12X1/4 (SUCTIONS) ×4 IMPLANT
TUBING FILTER THERMOFLATOR (ELECTROSURGICAL) ×3 IMPLANT
TUBING INSUFFLATION (TUBING) ×3 IMPLANT
YANKAUER SUCT BULB TIP NO VENT (SUCTIONS) ×6 IMPLANT

## 2014-11-04 NOTE — Transfer of Care (Signed)
Immediate Anesthesia Transfer of Care Note  Patient: Krystal Campbell  Procedure(s) Performed: Procedure(s): LAPAROSCOPIC PARTIAL COLECTOMY POSSIBLE OPEN (N/A)  Patient Location: PACU  Anesthesia Type:General  Level of Consciousness: awake, alert  and oriented  Airway & Oxygen Therapy: Patient Spontanous Breathing and Patient connected to nasal cannula oxygen  Post-op Assessment: Report given to RN and Post -op Vital signs reviewed and stable  Post vital signs: Reviewed and stable  Last Vitals:  Filed Vitals:   11/04/14 0542  BP: 105/46  Pulse: 108  Temp: 37 C  Resp: 18    Complications: No apparent anesthesia complications

## 2014-11-04 NOTE — Progress Notes (Signed)
Heparin can be resumed 48 hours after surgery (10am on 11/06/14)     Per Surgery PA Nat Christen).  Pharmacist made aware.

## 2014-11-04 NOTE — H&P (View-Only) (Signed)
Day of Surgery  Subjective: Pt has no questions DISCUSSED PROCEDURE AT LENGTH  HUSBAND IN THE ROOM  THEY HAVE NO FURTHER QUESTIONS   Objective: Vital signs in last 24 hours: Temp:  [97.7 F (36.5 C)-98.8 F (37.1 C)] 98.6 F (37 C) (08/10 0542) Pulse Rate:  [92-108] 108 (08/10 0542) Resp:  [15-25] 18 (08/10 0542) BP: (105-123)/(46-68) 105/46 mmHg (08/10 0542) SpO2:  [91 %-100 %] 95 % (08/10 0542) Weight:  [112.81 kg (248 lb 11.2 oz)] 112.81 kg (248 lb 11.2 oz) (08/10 0542) Last BM Date: 10/30/14  Intake/Output from previous day: 08/09 0701 - 08/10 0700 In: 122.5 [I.V.:122.5] Out: -  Intake/Output this shift:    GI: SOFT  SLIGHT DISTENTION  Lab Results:   Recent Labs  11/03/14 0533 11/04/14 0450  WBC 10.7* 8.3  HGB 10.4* 9.7*  HCT 34.4* 32.0*  PLT 333 307   BMET  Recent Labs  11/02/14 0449 11/04/14 0450  NA 136 134*  K 4.3 3.8  CL 104 99*  CO2 23 26  GLUCOSE 193* 192*  BUN 9 7  CREATININE 1.00 1.02*  CALCIUM 9.0 8.9   PT/INR  Recent Labs  11/03/14 0533  LABPROT 13.9  INR 1.05   ABG No results for input(s): PHART, HCO3 in the last 72 hours.  Invalid input(s): PCO2, PO2  Studies/Results: Ir Ivc Filter Plmt / S&i /img Guid/mod Sed  11/03/2014   INDICATION: History of pulmonary embolism and cecal mass. Patient with impending colonic surgery and as such, request made for placement of a IVC filter for postoperative caval interruption.  EXAM: ULTRASOUND GUIDANCE FOR VASCULAR ACCESS  IVC CATHETERIZATION AND VENOGRAM  IVC FILTER INSERTION  COMPARISON:  CT the chest, abdomen and pelvis - 10/25/2014  MEDICATIONS: Fentanyl 100 mcg IV; Versed 2 mg IV  ANESTHESIA/SEDATION: Sedation Time  10 minutes  CONTRAST:  42mL OMNIPAQUE IOHEXOL 300 MG/ML  SOLN  FLUOROSCOPY TIME:  1 minute, 24 seconds  COMPLICATIONS: None immediate  PROCEDURE: Informed consent was obtained from the patient following explanation of the procedure, risks, benefits and alternatives. The patient  understands, agrees and consents for the procedure. All questions were addressed. A time out was performed prior to the initiation of the procedure.  Maximal barrier sterile technique utilized including caps, mask, sterile gowns, sterile gloves, large sterile drape, hand hygiene, and Betadine prep.  Under sterile condition and local anesthesia, right internal jugular venous access was performed with ultrasound. An ultrasound image was saved and sent to PACS. Over a guidewire, the IVC filter delivery sheath and inner dilator were advanced into the IVC just above the IVC bifurcation. Contrast injection was performed for an IVC venogram.  Through the delivery sheath, a retrievable Denali IVC filter was deployed below the level of the renal veins and above the IVC bifurcation. Limited post deployment venacavagram was performed.  The delivery sheath was removed and hemostasis was obtained with manual compression. A dressing was placed. The patient tolerated the procedure well without immediate post procedural complication.  FINDINGS: The IVC is patent. No evidence of thrombus, stenosis, or occlusion. No variant venous anatomy. Successful placement of the IVC filter below the level of the renal veins.  IMPRESSION: Successful ultrasound and fluoroscopically guided placement of an infrarenal retrievable IVC filter via right jugular approach.  This IVC filter is potentially retrievable. The patient will be assessed for filter retrieval by Interventional Radiology in approximately 8-12 weeks. Further recommendations regarding filter retrieval, continued surveillance or declaration of device permanence, will be made at  that time.   Electronically Signed   By: Sandi Mariscal M.D.   On: 11/03/2014 11:05   Dg Abd Portable 1v  11/02/2014   CLINICAL DATA:  53 year old female with acute abdominal pain. Initial encounter.  EXAM: PORTABLE ABDOMEN - 1 VIEW  COMPARISON:  None.  FINDINGS: Gas distended proximal and mid small bowel loops  with paucity of distal small bowel gas suggestive of small bowel obstruction.  The possibility of free intraperitoneal air cannot be assessed on a supine view.  Tubal ligation clips are in place.  IMPRESSION: Gas distended proximal and mid small bowel loops with paucity of distal small bowel gas suggestive of small bowel obstruction.  The possibility of free intraperitoneal air cannot be assessed on a supine view.  These results will be called to the ordering clinician or representative by the Radiologist Assistant, and communication documented in the PACS or zVision Dashboard.   Electronically Signed   By: Genia Del M.D.   On: 11/02/2014 18:03    Anti-infectives: Anti-infectives    None      Assessment/Plan: Cecal mass partially obstructing adenocarcinoma OR for lap assisted partial colectomy The procedure was discussed with the patient.  Laparoscopic partial colectomy discussed with the patient as well as non operative treatments. The risks of operative management include bleeding,  Infection,  Leak of anastamosis,  Ostomy formation, open procedure,  Sepsis,  Abcess,  Hernia,  DVT,  Pulmonary complications,  Cardiovascular  complications,  Injury to ureter,  Bladder,kidney,and anesthesia risks,  And death. The patient understands.  Questions answered.   The success of the procedure is 50-85 % for treating the patients symptoms. They agree to proceed.    LOS: 10 days    [name] A. 11/04/2014

## 2014-11-04 NOTE — Progress Notes (Signed)
Day of Surgery  Subjective: Pt has no questions DISCUSSED PROCEDURE AT LENGTH  HUSBAND IN THE ROOM  THEY HAVE NO FURTHER QUESTIONS   Objective: Vital signs in last 24 hours: Temp:  [97.7 F (36.5 C)-98.8 F (37.1 C)] 98.6 F (37 C) (08/10 0542) Pulse Rate:  [92-108] 108 (08/10 0542) Resp:  [15-25] 18 (08/10 0542) BP: (105-123)/(46-68) 105/46 mmHg (08/10 0542) SpO2:  [91 %-100 %] 95 % (08/10 0542) Weight:  [112.81 kg (248 lb 11.2 oz)] 112.81 kg (248 lb 11.2 oz) (08/10 0542) Last BM Date: 10/30/14  Intake/Output from previous day: 08/09 0701 - 08/10 0700 In: 122.5 [I.V.:122.5] Out: -  Intake/Output this shift:    GI: SOFT  SLIGHT DISTENTION  Lab Results:   Recent Labs  11/03/14 0533 11/04/14 0450  WBC 10.7* 8.3  HGB 10.4* 9.7*  HCT 34.4* 32.0*  PLT 333 307   BMET  Recent Labs  11/02/14 0449 11/04/14 0450  NA 136 134*  K 4.3 3.8  CL 104 99*  CO2 23 26  GLUCOSE 193* 192*  BUN 9 7  CREATININE 1.00 1.02*  CALCIUM 9.0 8.9   PT/INR  Recent Labs  11/03/14 0533  LABPROT 13.9  INR 1.05   ABG No results for input(s): PHART, HCO3 in the last 72 hours.  Invalid input(s): PCO2, PO2  Studies/Results: Ir Ivc Filter Plmt / S&i /img Guid/mod Sed  11/03/2014   INDICATION: History of pulmonary embolism and cecal mass. Patient with impending colonic surgery and as such, request made for placement of a IVC filter for postoperative caval interruption.  EXAM: ULTRASOUND GUIDANCE FOR VASCULAR ACCESS  IVC CATHETERIZATION AND VENOGRAM  IVC FILTER INSERTION  COMPARISON:  CT the chest, abdomen and pelvis - 10/25/2014  MEDICATIONS: Fentanyl 100 mcg IV; Versed 2 mg IV  ANESTHESIA/SEDATION: Sedation Time  10 minutes  CONTRAST:  43mL OMNIPAQUE IOHEXOL 300 MG/ML  SOLN  FLUOROSCOPY TIME:  1 minute, 24 seconds  COMPLICATIONS: None immediate  PROCEDURE: Informed consent was obtained from the patient following explanation of the procedure, risks, benefits and alternatives. The patient  understands, agrees and consents for the procedure. All questions were addressed. A time out was performed prior to the initiation of the procedure.  Maximal barrier sterile technique utilized including caps, mask, sterile gowns, sterile gloves, large sterile drape, hand hygiene, and Betadine prep.  Under sterile condition and local anesthesia, right internal jugular venous access was performed with ultrasound. An ultrasound image was saved and sent to PACS. Over a guidewire, the IVC filter delivery sheath and inner dilator were advanced into the IVC just above the IVC bifurcation. Contrast injection was performed for an IVC venogram.  Through the delivery sheath, a retrievable Denali IVC filter was deployed below the level of the renal veins and above the IVC bifurcation. Limited post deployment venacavagram was performed.  The delivery sheath was removed and hemostasis was obtained with manual compression. A dressing was placed. The patient tolerated the procedure well without immediate post procedural complication.  FINDINGS: The IVC is patent. No evidence of thrombus, stenosis, or occlusion. No variant venous anatomy. Successful placement of the IVC filter below the level of the renal veins.  IMPRESSION: Successful ultrasound and fluoroscopically guided placement of an infrarenal retrievable IVC filter via right jugular approach.  This IVC filter is potentially retrievable. The patient will be assessed for filter retrieval by Interventional Radiology in approximately 8-12 weeks. Further recommendations regarding filter retrieval, continued surveillance or declaration of device permanence, will be made at  that time.   Electronically Signed   By: Sandi Mariscal M.D.   On: 11/03/2014 11:05   Dg Abd Portable 1v  11/02/2014   CLINICAL DATA:  54 year old female with acute abdominal pain. Initial encounter.  EXAM: PORTABLE ABDOMEN - 1 VIEW  COMPARISON:  None.  FINDINGS: Gas distended proximal and mid small bowel loops  with paucity of distal small bowel gas suggestive of small bowel obstruction.  The possibility of free intraperitoneal air cannot be assessed on a supine view.  Tubal ligation clips are in place.  IMPRESSION: Gas distended proximal and mid small bowel loops with paucity of distal small bowel gas suggestive of small bowel obstruction.  The possibility of free intraperitoneal air cannot be assessed on a supine view.  These results will be called to the ordering clinician or representative by the Radiologist Assistant, and communication documented in the PACS or zVision Dashboard.   Electronically Signed   By: Genia Del M.D.   On: 11/02/2014 18:03    Anti-infectives: Anti-infectives    None      Assessment/Plan: Cecal mass partially obstructing adenocarcinoma OR for lap assisted partial colectomy The procedure was discussed with the patient.  Laparoscopic partial colectomy discussed with the patient as well as non operative treatments. The risks of operative management include bleeding,  Infection,  Leak of anastamosis,  Ostomy formation, open procedure,  Sepsis,  Abcess,  Hernia,  DVT,  Pulmonary complications,  Cardiovascular  complications,  Injury to ureter,  Bladder,kidney,and anesthesia risks,  And death. The patient understands.  Questions answered.   The success of the procedure is 50-85 % for treating the patients symptoms. They agree to proceed.    LOS: 10 days    Jomayra Novitsky A. 11/04/2014

## 2014-11-04 NOTE — Anesthesia Procedure Notes (Signed)
Procedure Name: Intubation Date/Time: 11/04/2014 8:41 AM Performed by: Manuela Schwartz B Pre-anesthesia Checklist: Patient identified, Emergency Drugs available, Suction available, Patient being monitored and Timeout performed Patient Re-evaluated:Patient Re-evaluated prior to inductionOxygen Delivery Method: Circle system utilized Preoxygenation: Pre-oxygenation with 100% oxygen Intubation Type: IV induction Ventilation: Mask ventilation without difficulty Laryngoscope Size: Mac and 3 Grade View: Grade I Tube type: Oral Tube size: 7.5 mm Number of attempts: 1 Airway Equipment and Method: Stylet Placement Confirmation: ETT inserted through vocal cords under direct vision,  positive ETCO2 and breath sounds checked- equal and bilateral Secured at: 21 cm Tube secured with: Tape Dental Injury: Teeth and Oropharynx as per pre-operative assessment

## 2014-11-04 NOTE — Brief Op Note (Signed)
10/25/2014 - 11/04/2014  10:49 AM  PATIENT:  Krystal Campbell  54 y.o. female  PRE-OPERATIVE DIAGNOSIS:  cecal Mass  POST-OPERATIVE DIAGNOSIS:  cecal Mass  PROCEDURE:  Procedure(s): LAPAROSCOPIC PARTIAL COLECTOMY POSSIBLE OPEN (N/A)  SURGEON:  Surgeon(s) and Role:    * Erroll Luna, MD - Primary      ANESTHESIA:   local and general  EBL:  Total I/O In: 1000 [I.V.:1000] Out: 150 [Urine:100; Blood:50]  BLOOD ADMINISTERED:none    LOCAL MEDICATIONS USED:  BUPIVICAINE   SPECIMEN:  Source of Specimen:  right colon   DISPOSITION OF SPECIMEN:  PATHOLOGY  COUNTS:  YES  TOURNIQUET:  * No tourniquets in log *  DICTATION: .Other Dictation: Dictation Number  [id number]  PLAN OF CARE: Admit to inpatient   PATIENT DISPOSITION:  PACU - hemodynamically stable.   Delay start of Pharmacological VTE agent (>24hrs) due to surgical blood loss or risk of bleeding: yes

## 2014-11-04 NOTE — Interval H&P Note (Signed)
History and Physical Interval Note:  11/04/2014 8:28 AM  Krystal Campbell  has presented today for surgery, with the diagnosis of Fecal Mass  The various methods of treatment have been discussed with the patient and family. After consideration of risks, benefits and other options for treatment, the patient has consented to  Procedure(s): LAPAROSCOPIC PARTIAL COLECTOMY POSSIBLE OPEN (N/A) as a surgical intervention .  The patient's history has been reviewed, patient examined, no change in status, stable for surgery.  I have reviewed the patient's chart and labs.  Questions were answered to the patient's satisfaction.     Gershom Brobeck A.

## 2014-11-04 NOTE — Progress Notes (Signed)
Physician  Progress note     Krystal Campbell MRN: [id number] DOB/AGE: [birth year] 54 y.o.  PCP: Josem Kaufmann, MD   Admit date: 10/25/2014 Discharge date: Pending  Active diagnoses    Principal Problem:   Acute pulmonary embolism Active Problems:   Diabetes   Hypertension   GERD (gastroesophageal reflux disease)   Pulmonary emboli   Cecal lesion   Anxiety state   Iron deficiency anemia  status post EGD/colonoscopy Cecal mass       HPI :*54 y.o. female  With PMhx of HTN, DM, obesity.  Sent to [facility] around July 15th by her PCP after Hgb was found to be low. She has fatigue, SOB, and pica. PCP wanted a GI evaluation (UGD and colonoscopy). Patient was transfused and sent for an outpatient GI. She continued to feel poorly. Her daughter was in a car accident and patient drove 10 hours with a friend to check on her. She had a cough during that time and SOB. When patient returned, she saw her PCP who gave her azithromycin with no improvement.  Today, she presented to [facility] with continued SOB, fatigue.  At [facility], she was found to have a PE on CTA with acute heart strain. She also received a CT Scan of abd as her hgb was low at [employer] and was found to have cecal wall thickening with right mesenteric lymph node-- gastroenterology recommended EGD/colonoscopy  Subjective Patient unable to have a BM for several days but started complaining of acute abdominal pain yesterday, KUB concerning for small bowel obstruction  HOSPITAL COURSE:  Acute pulmonary embolism without suggestion of right heart strain Patient's risk factors include her recent road trip to [place]. In the setting of malignancy. Patient is currently hemodynamically stable. She was tachycardic on presentation. Heart rate is improved. Currently on a IV heparin drip pending further management decisions. Venous Doppler study shows superficial thrombosis of right lesser saphenous vein. Left Baker's cyst  noted. Echocardiogram showed No evidence for cor pulmonale or RV dysfunction on echocardiogram.  Restart heparin drip when safe to do so post surgery, consider switching to Lovenox and Coumadin in 1-2 days.    She will also need a 5 day overlap with subcutaneous Lovenox until her INR is greater than 2.0  She will need anticoagulation for 3-6 months or as determined by heme-onc  Patient was evaluated by pulmonology and cleared to undergo GI procedure in the setting of her acute PE. Pulmonary recommended placement of IVC filter due to concern for epidural clot burden and withholding systemic anticoagulation at the time of surgery. Patient had IVC filter placement on 8/9. Bilateral upper extremity venous Doppler negative.    Constipation-continue Miralax, Colace,   Thickening of the cecum noted on CT scan Concerning for malignancy in view of recently discovered microcytic anemia. PATIENT IS STATUS POST EGD  AND COLONOSCOPY,  hemoglobin has been stable for several days  IMPRESSION: 1. Cecal mass, consistent with a malignant neoplasm, correlating to that seen on the CT scan 2. Left-sided diverticulosis..   Further imaging such as CT scans would be recommended by general surgery. Patient will need right colectomy Discussed with  pulmonary about the risk  Of undergoing abdominal surgery in the setting of recent PE, pulmonary also recommends Coumadin as treatment of choice long-term for patient's PE  , due to concern about the propensity of bleeding of the cecal mass. 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    History of GERD Continue PPI. Status post EGD  History of diabetes mellitus type 2 without any complications Initiated on SSI, resume metformin at discharge  History of essential hypertension Continue monitoring blood pressure.  Microcytic  anemia secondary to iron deficiency. She was transfused about 2 weeks ago. Hemoglobin has been stable for several days. Her ferritin is 6. TIBC 528. Vitamin B-12 257. Continue B-12 supplementation. Recheck CBC tomorrow  Morbid obesity Body mass index is 42.67 kg/(m^2).  Disposition-PT eval for tomorrow   Consults: Gastroenterology Pulmonary General  surgery    Significant Diagnostic Studies:  Ir Ivc Filter Plmt / S&i /img Guid/mod Sed  11/03/2014   INDICATION: History of pulmonary embolism and cecal mass. Patient with impending colonic surgery and as such, request made for placement of a IVC filter for postoperative caval interruption.  EXAM: ULTRASOUND GUIDANCE FOR VASCULAR ACCESS  IVC CATHETERIZATION AND VENOGRAM  IVC FILTER INSERTION  COMPARISON:  CT the chest, abdomen and pelvis - 10/25/2014  MEDICATIONS: Fentanyl 100 mcg IV; Versed 2 mg IV  ANESTHESIA/SEDATION: Sedation Time  10 minutes  CONTRAST:  65m OMNIPAQUE IOHEXOL 300 MG/ML  SOLN  FLUOROSCOPY TIME:  1 minute, 24 seconds  COMPLICATIONS: None immediate  PROCEDURE: Informed consent was obtained from the patient following explanation of the procedure, risks, benefits and alternatives. The patient understands, agrees and consents for the procedure. All questions were addressed. A time out was performed prior to the initiation of the procedure.  Maximal barrier sterile technique utilized including caps, mask, sterile gowns, sterile gloves, large sterile drape, hand hygiene, and Betadine prep.  Under sterile condition and local anesthesia, right internal jugular venous access was performed with ultrasound. An ultrasound image was saved and sent to PACS. Over a guidewire, the IVC filter delivery sheath and inner dilator were advanced into the IVC just above the IVC bifurcation. Contrast injection was performed for an IVC venogram.  Through the delivery sheath, a retrievable Denali IVC filter was deployed below the level of the renal veins and  above the IVC bifurcation. Limited post deployment venacavagram was performed.  The delivery sheath was removed and hemostasis was obtained with manual compression. A dressing was placed. The patient tolerated the procedure well without immediate post procedural complication.  FINDINGS: The IVC is patent. No evidence of thrombus, stenosis, or occlusion. No variant venous anatomy. Successful placement of the IVC filter below the level of the renal veins.  IMPRESSION: Successful ultrasound and fluoroscopically guided placement of an infrarenal retrievable IVC filter via right jugular approach.  This IVC filter is potentially retrievable. The patient will be assessed for filter retrieval by Interventional Radiology in approximately 8-12 weeks. Further recommendations regarding filter retrieval, continued surveillance or declaration of device permanence, will be made at that time.   Electronically Signed   By: JSandi MariscalM.D.   On: 11/03/2014 11:05   Dg Chest Port 1 View  10/25/2014   CLINICAL DATA:  Shortness of breath, PE  EXAM: PORTABLE CHEST - 1 VIEW  COMPARISON:  None.  FINDINGS: Lungs are clear.  No pleural effusion or pneumothorax.  The heart is normal in size.  IMPRESSION: No evidence of acute cardiopulmonary disease.   Electronically Signed   By: SJulian HyM.D.   On: 10/25/2014 18:20   Dg Abd Portable 1v  11/02/2014   CLINICAL DATA:  54year old female with acute abdominal pain. Initial encounter.  EXAM: PORTABLE ABDOMEN - 1 VIEW  COMPARISON:  None.  FINDINGS: Gas distended proximal and mid small bowel loops with paucity of distal small bowel gas suggestive of small bowel obstruction.  The possibility of free intraperitoneal air cannot be assessed on a supine view.  Tubal ligation clips are in place.  IMPRESSION: Gas distended proximal and mid small bowel loops with paucity of distal small bowel gas suggestive of small bowel obstruction.  The possibility of free intraperitoneal air cannot be  assessed on a supine view.  These results will be called to the ordering clinician or representative by the Radiologist Assistant, and communication documented in the PACS or zVision Dashboard.   Electronically Signed   By: Genia Del M.D.   On: 11/02/2014 18:03     echocardiogram EF 60-65% Left ventricle: The cavity size was normal. Wall thickness was normal. Systolic function was normal. The estimated ejection fraction was in the range of 60% to 65%. Wall motion was normal; there were no regional wall motion abnormalities. - Left atrium: The atrium was mildly dilated.    Filed Weights   11/01/14 0500 11/03/14 0515 11/04/14 0542  Weight: 109.907 kg (242 lb 4.8 oz) 111.585 kg (246 lb) 112.81 kg (248 lb 11.2 oz)     Microbiology: Recent Results (from the past 240 hour(s))  MRSA PCR Screening     Status: None   Collection Time: 10/25/14  5:08 PM  Result Value Ref Range Status   MRSA by PCR NEGATIVE NEGATIVE Final    Comment:        The GeneXpert MRSA Assay (FDA approved for NASAL specimens only), is one component of a comprehensive MRSA colonization surveillance program. It is not intended to diagnose MRSA infection nor to guide or monitor treatment for MRSA infections.   Surgical pcr screen     Status: None   Collection Time: 11/04/14  6:09 AM  Result Value Ref Range Status   MRSA, PCR NEGATIVE NEGATIVE Final   Staphylococcus aureus NEGATIVE NEGATIVE Final    Comment:        The Xpert SA Assay (FDA approved for NASAL specimens in patients over 42 years of age), is one component of a comprehensive surveillance program.  Test performance has been validated by [facility] for patients greater than or equal to 57 year old. It is not intended to diagnose infection nor to guide or monitor treatment.        Blood Culture No results found for: SDES, SPECREQUEST, CULT, REPTSTATUS    Labs: Results for orders placed or performed during the hospital  encounter of 10/25/14 (from the past 48 hour(s))  Glucose, capillary     Status: Abnormal   Collection Time: 11/02/14 11:49 AM  Result Value Ref Range   Glucose-Capillary 202 (H) 65 - 99 mg/dL  Glucose, capillary     Status: Abnormal   Collection Time: 11/02/14  4:23 PM  Result Value Ref Range   Glucose-Capillary 142 (H) 65 - 99 mg/dL  Glucose, capillary     Status: Abnormal   Collection Time: 11/02/14  8:53 PM  Result Value Ref Range   Glucose-Capillary 180 (H) 65 - 99 mg/dL  Heparin level (unfractionated)     Status: None   Collection Time: 11/03/14  5:33 AM  Result Value Ref Range   Heparin Unfractionated 0.65 0.30 - 0.70 IU/mL    Comment:        IF HEPARIN RESULTS ARE BELOW EXPECTED VALUES, AND PATIENT DOSAGE HAS BEEN CONFIRMED, SUGGEST FOLLOW UP TESTING OF ANTITHROMBIN III LEVELS.  CBC     Status: Abnormal   Collection Time: 11/03/14  5:33 AM  Result Value Ref Range   WBC 10.7 (H) 4.0 - 10.5 K/uL   RBC 4.26 3.87 - 5.11 MIL/uL   Hemoglobin 10.4 (L) 12.0 - 15.0 g/dL   HCT 34.4 (L) 36.0 - 46.0 %   MCV 79.8 78.0 - 100.0 fL   MCH 23.9 (L) 26.0 - 34.0 pg   MCHC 30.0 30.0 - 36.0 g/dL   RDW NOT CALCULATED 11.5 - 15.5 %   Platelets 333 150 - 400 K/uL  Protime-INR     Status: None   Collection Time: 11/03/14  5:33 AM  Result Value Ref Range   Prothrombin Time 13.9 11.6 - 15.2 seconds   INR 1.05 0.00 - 1.49  Glucose, capillary     Status: Abnormal   Collection Time: 11/03/14  7:20 AM  Result Value Ref Range   Glucose-Capillary 209 (H) 65 - 99 mg/dL  Glucose, capillary     Status: Abnormal   Collection Time: 11/03/14 11:27 AM  Result Value Ref Range   Glucose-Capillary 214 (H) 65 - 99 mg/dL  Glucose, capillary     Status: Abnormal   Collection Time: 11/03/14  4:00 PM  Result Value Ref Range   Glucose-Capillary 182 (H) 65 - 99 mg/dL  Glucose, capillary     Status: Abnormal   Collection Time: 11/03/14  9:12 PM  Result Value Ref Range   Glucose-Capillary 185 (H) 65 - 99  mg/dL  Heparin level (unfractionated)     Status: Abnormal   Collection Time: 11/04/14  4:50 AM  Result Value Ref Range   Heparin Unfractionated <0.10 (L) 0.30 - 0.70 IU/mL    Comment:        IF HEPARIN RESULTS ARE BELOW EXPECTED VALUES, AND PATIENT DOSAGE HAS BEEN CONFIRMED, SUGGEST FOLLOW UP TESTING OF ANTITHROMBIN III LEVELS.   CBC     Status: Abnormal   Collection Time: 11/04/14  4:50 AM  Result Value Ref Range   WBC 8.3 4.0 - 10.5 K/uL   RBC 3.99 3.87 - 5.11 MIL/uL   Hemoglobin 9.7 (L) 12.0 - 15.0 g/dL   HCT 32.0 (L) 36.0 - 46.0 %   MCV 80.2 78.0 - 100.0 fL   MCH 24.3 (L) 26.0 - 34.0 pg   MCHC 30.3 30.0 - 36.0 g/dL   RDW NOT CALCULATED 11.5 - 15.5 %   Platelets 307 150 - 400 K/uL  Basic metabolic panel     Status: Abnormal   Collection Time: 11/04/14  4:50 AM  Result Value Ref Range   Sodium 134 (L) 135 - 145 mmol/L   Potassium 3.8 3.5 - 5.1 mmol/L   Chloride 99 (L) 101 - 111 mmol/L   CO2 26 22 - 32 mmol/L   Glucose, Bld 192 (H) 65 - 99 mg/dL   BUN 7 6 - 20 mg/dL   Creatinine, Ser 1.02 (H) 0.44 - 1.00 mg/dL   Calcium 8.9 8.9 - 10.3 mg/dL   GFR calc non Af Amer >60 >60 mL/min   GFR calc Af Amer >60 >60 mL/min    Comment: (NOTE) The eGFR has been calculated using the CKD EPI equation. This calculation has not been validated in all clinical situations. eGFR's persistently <60 mL/min signify possible Chronic Kidney Disease.    Anion gap 9 5 - 15  Surgical pcr screen     Status: None   Collection Time: 11/04/14  6:09 AM  Result Value Ref Range  MRSA, PCR NEGATIVE NEGATIVE   Staphylococcus aureus NEGATIVE NEGATIVE    Comment:        The Xpert SA Assay (FDA approved for NASAL specimens in patients over 35 years of age), is one component of a comprehensive surveillance program.  Test performance has been validated by [facility] for patients greater than or equal to 60 year old. It is not intended to diagnose infection nor to guide or monitor treatment.    Glucose, capillary     Status: Abnormal   Collection Time: 11/04/14  6:59 AM  Result Value Ref Range   Glucose-Capillary 212 (H) 65 - 99 mg/dL     Lipid Panel  No results found for: CHOL, TRIG, HDL, CHOLHDL, VLDL, LDLCALC, LDLDIRECT   No results found for: HGBA1C   Lab Results  Component Value Date   CREATININE 1.02* 11/04/2014       Physical Exam:    Blood pressure 105/46, pulse 108, temperature 98.6 F (37 C), temperature source Oral, resp. rate 18, height '5\' 4"'  (1.626 m), weight 112.81 kg (248 lb 11.2 oz), SpO2 95 %. General appearance: alert, cooperative, appears stated age, no distress and morbidly obese Resp: Coarse breath sounds bilaterally with no wheezing, rales or rhonchi. Cardio: regular rate and rhythm, S1, S2 normal, no murmur, click, rub or gallop GI: Soft. Vague discomfort in the upper abdomen without any clear-cut rebound, rigidity or guarding. No masses or organomegaly. Bowel sounds present. Extremities: Nonpitting swelling noted in both lower extremities. Neurologic: No focal deficits    Discharge Instructions    Diet - low sodium heart healthy    Complete by:  As directed      Increase activity slowly    Complete by:  As directed            Follow-up Information    Follow up with [facility], MD. Schedule an appointment as soon as possible for a visit in 3 days.   Specialty:  Family Medicine   Contact information:   [place] [id number] [phone]       Signed: Reyne Dumas 11/04/2014, 10:52 AM        Time spent >45 mins

## 2014-11-04 NOTE — Anesthesia Preprocedure Evaluation (Signed)
Anesthesia Evaluation  Patient identified by MRN, date of birth, ID band Patient awake    Reviewed: Allergy & Precautions, NPO status , Patient's Chart, lab work & pertinent test results  Airway Mallampati: I  TM Distance: >3 FB Neck ROM: Full    Dental   Pulmonary former smoker,    Pulmonary exam normal       Cardiovascular hypertension, Pt. on medications Normal cardiovascular exam    Neuro/Psych Anxiety    GI/Hepatic GERD-  Controlled and Medicated,  Endo/Other  diabetes, Type 2, Oral Hypoglycemic Agents, Insulin Dependent  Renal/GU      Musculoskeletal   Abdominal   Peds  Hematology   Anesthesia Other Findings   Reproductive/Obstetrics                             Anesthesia Physical Anesthesia Plan  ASA: III  Anesthesia Plan: General   Post-op Pain Management:    Induction: Intravenous  Airway Management Planned: Oral ETT  Additional Equipment:   Intra-op Plan:   Post-operative Plan: Extubation in OR  Informed Consent: I have reviewed the patients History and Physical, chart, labs and discussed the procedure including the risks, benefits and alternatives for the proposed anesthesia with the patient or authorized representative who has indicated his/her understanding and acceptance.     Plan Discussed with: CRNA and Surgeon  Anesthesia Plan Comments:         Anesthesia Quick Evaluation

## 2014-11-05 ENCOUNTER — Encounter (HOSPITAL_COMMUNITY): Payer: Self-pay | Admitting: Surgery

## 2014-11-05 LAB — CBC
HCT: 31 % — ABNORMAL LOW (ref 36.0–46.0)
Hemoglobin: 9.3 g/dL — ABNORMAL LOW (ref 12.0–15.0)
MCH: 24.7 pg — AB (ref 26.0–34.0)
MCHC: 30 g/dL (ref 30.0–36.0)
MCV: 82.4 fL (ref 78.0–100.0)
PLATELETS: 265 10*3/uL (ref 150–400)
RBC: 3.76 MIL/uL — ABNORMAL LOW (ref 3.87–5.11)
WBC: 8.5 10*3/uL (ref 4.0–10.5)

## 2014-11-05 LAB — GLUCOSE, CAPILLARY
GLUCOSE-CAPILLARY: 174 mg/dL — AB (ref 65–99)
GLUCOSE-CAPILLARY: 144 mg/dL — AB (ref 65–99)
Glucose-Capillary: 180 mg/dL — ABNORMAL HIGH (ref 65–99)

## 2014-11-05 LAB — COMPREHENSIVE METABOLIC PANEL
ALT: 13 U/L — AB (ref 14–54)
AST: 18 U/L (ref 15–41)
Albumin: 2.7 g/dL — ABNORMAL LOW (ref 3.5–5.0)
Alkaline Phosphatase: 68 U/L (ref 38–126)
Anion gap: 9 (ref 5–15)
BUN: <5 mg/dL — ABNORMAL LOW (ref 6–20)
CALCIUM: 8.5 mg/dL — AB (ref 8.9–10.3)
CO2: 25 mmol/L (ref 22–32)
CREATININE: 0.99 mg/dL (ref 0.44–1.00)
Chloride: 103 mmol/L (ref 101–111)
GFR calc Af Amer: >60 mL/min (ref >60)
GFR calc non Af Amer: >60 mL/min (ref >60)
Glucose, Bld: 180 mg/dL — ABNORMAL HIGH (ref 65–99)
Potassium: 4.1 mmol/L (ref 3.5–5.1)
SODIUM: 137 mmol/L (ref 135–145)
Total Bilirubin: 0.7 mg/dL (ref 0.3–1.2)
Total Protein: 6 g/dL — ABNORMAL LOW (ref 6.5–8.1)

## 2014-11-05 MED ORDER — ALBUTEROL SULFATE (2.5 MG/3ML) 0.083% IN NEBU
5.0000 mg | INHALATION_SOLUTION | RESPIRATORY_TRACT | Status: DC | PRN
  Administered 2014-11-05: 5 mg via RESPIRATORY_TRACT
  Administered 2014-11-05: 2.5 mg via RESPIRATORY_TRACT
  Filled 2014-11-05 (×2): qty 6

## 2014-11-05 MED ORDER — ACETAMINOPHEN 10 MG/ML IV SOLN
1000.0000 mg | Freq: Four times a day (QID) | INTRAVENOUS | Status: AC
Start: 2014-11-05 — End: 2014-11-06
  Administered 2014-11-05 – 2014-11-06 (×4): 1000 mg via INTRAVENOUS
  Filled 2014-11-05 (×4): qty 100

## 2014-11-05 MED ORDER — DEXTROSE 5 % IV SOLN: 1000.0000 mg | Freq: Three times a day (TID) | INTRAVENOUS | Status: DC

## 2014-11-05 MED ORDER — METHOCARBAMOL 1000 MG/10ML IJ SOLN
1000.0000 mg | Freq: Three times a day (TID) | INTRAVENOUS | Status: DC
  Administered 2014-11-05 – 2014-11-06 (×4): 1000 mg via INTRAVENOUS
  Filled 2014-11-05 (×7): qty 10

## 2014-11-05 NOTE — Evaluation (Addendum)
Physical Therapy Evaluation Patient Details Name: Krystal Campbell MRN: [id number] DOB: [birth year] Today's Date: 11/05/2014   History of Present Illness  54 y.o. female admitted to [facility] on 10/25/14 for SOB, PE, tachycardia, anemia, and cecal wall thickening as seen on CT.  Pt dx with cecal mass (partially obstructing adenocarinoma) s/p lap assisted right colectomy and appendectomy on 11/04/14.  Pt with significant PMHx of DM, HTN, and R TKA.    Clinical Impression  Pt is walking well, mild imbalance, but may be due to PCA.  I would like to see her at least one more session to walk without holding the IV pole and to practice 2 steps for home entry.  She will likely not need any PT f/u at discharge.     Follow Up Recommendations No PT follow up    Equipment Recommendations  None recommended by PT    Recommendations for Other Services   NA    Precautions / Restrictions Precautions Precautions: Fall Precaution Comments: mildly unstable on her feet, likely due to PCA pain meds and extended time in bed.  Required Braces or Orthoses: Other Brace/Splint Other Brace/Splint: abdominal binder      Mobility  Bed Mobility Overal bed mobility: Modified Independent             General bed mobility comments: HOB elevated, pt using rail.  Verbal cues to roll to protect her abdomen and decrease pain.   Transfers Overall transfer level: Needs assistance Equipment used: Rolling walker (2 wheeled) Transfers: Sit to/from Stand Sit to Stand: Supervision         General transfer comment: supervision for safety, pt reaching for IV for stability  Ambulation/Gait Ambulation/Gait assistance: Supervision Ambulation Distance (Feet): 300 Feet Assistive device:  (IV pole) Gait Pattern/deviations: Step-through pattern;Staggering left;Staggering right     General Gait Details: mildly staggering gait pattern, pt able to compensate with use of IV pole.           Balance Overall balance assessment:  Needs assistance Sitting-balance support: Feet supported;No upper extremity supported Sitting balance-Leahy Scale: Good     Standing balance support: No upper extremity supported;Single extremity supported Standing balance-Leahy Scale: Good                               Pertinent Vitals/Pain Pain Assessment: 0-10 Pain Score: 6  Pain Location: abdomen Pain Descriptors / Indicators: Burning Pain Intervention(s): Limited activity within patient's tolerance;Monitored during session;Repositioned    Home Living Family/patient expects to be discharged to:: Private residence Living Arrangements: Spouse/significant other Available Help at Discharge: Family Type of Home: House Home Access: Stairs to enter Entrance Stairs-Rails: None Technical brewer of Steps: 2 Home Layout: One level Home Equipment: Cane - single point      Prior Function Level of Independence: Independent with assistive device(s)         Comments: uses cane when she goes out        Extremity/Trunk Assessment   Upper Extremity Assessment: Overall WFL for tasks assessed           Lower Extremity Assessment: Generalized weakness      Cervical / Trunk Assessment: Normal  Communication   Communication: No difficulties  Cognition Arousal/Alertness: Awake/alert Behavior During Therapy: WFL for tasks assessed/performed Overall Cognitive Status: Within Functional Limits for tasks assessed  Assessment/Plan    PT Assessment Patient needs continued PT services  PT Diagnosis Difficulty walking;Abnormality of gait;Generalized weakness   PT Problem List Decreased strength;Decreased activity tolerance;Decreased balance;Decreased mobility;Decreased knowledge of use of DME;Decreased knowledge of precautions;Obesity;Pain  PT Treatment Interventions DME instruction;Gait training;Stair training;Functional mobility training;Therapeutic  activities;Therapeutic exercise;Balance training;Neuromuscular re-education;Patient/family education   PT Goals (Current goals can be found in the Care Plan section) Acute Rehab PT Goals Patient Stated Goal: to go home PT Goal Formulation: With patient Time For Goal Achievement: 11/19/14 Potential to Achieve Goals: Good    Frequency Min 3X/week        End of Session   Activity Tolerance: Patient limited by fatigue;Patient limited by pain Patient left: in bed;with call bell/phone within reach;with family/visitor present          Time: 1350-1410 PT Time Calculation (min) (ACUTE ONLY): 20 min   Charges:   PT Evaluation $Initial PT Evaluation Tier I: 1 Procedure          Johnathan Tortorelli B. [place], [place], DPT [phone]   11/05/2014, 3:06 PM

## 2014-11-05 NOTE — Anesthesia Postprocedure Evaluation (Signed)
  Anesthesia Post-op Note  Patient: Krystal Campbell  Procedure(s) Performed: Procedure(s) (LRB): LAPAROSCOPIC PARTIAL COLECTOMY POSSIBLE OPEN (N/A)  Patient Location: PACU  Anesthesia Type: General  Level of Consciousness: awake and alert   Airway and Oxygen Therapy: Patient Spontanous Breathing  Post-op Pain: mild  Post-op Assessment: Post-op Vital signs reviewed, Patient's Cardiovascular Status Stable, Respiratory Function Stable, Patent Airway and No signs of Nausea or vomiting  Last Vitals:  Filed Vitals:   11/05/14 1142  BP:   Pulse:   Temp:   Resp: 22    Post-op Vital Signs: stable   Complications: No apparent anesthesia complications

## 2014-11-05 NOTE — Progress Notes (Addendum)
Physician  Progress note     Krystal Campbell MRN: [id number] DOB/AGE: 1960-09-14 54 y.o.  PCP: Josem Kaufmann, MD   Admit date: 10/25/2014 Discharge date: Pending  Active diagnoses    Principal Problem:   Acute pulmonary embolism Active Problems:   Diabetes   Hypertension   GERD (gastroesophageal reflux disease)   Pulmonary emboli   Cecal lesion   Anxiety state   Iron deficiency anemia  status post EGD/colonoscopy Cecal mass       HPI :*54 y.o. female  54 year old female with history of HTN, DM, obesity, with history of anemia, required blood transfusion recently as June, dating outpatient GI evaluation, presents to [facility] with complaints of shortness of breath and cough after [place], and no improvement oral azithromycin given by PCP , CTA chest significant for PE , patient anemia was found to be secondary to malignancy: Mass on colonoscopy on 10/30/14, patient status post IVC filter in anticipation to hold anticoagulation for cecal mass surgery, patient developed developed  bowel obstruction due to obstructive adenocarcinoma , she had to go to the OR on 8/10, patient had laparoscopic-assisted partial colectomy.  Subjective  HOSPITAL COURSE:  Acute pulmonary embolism without suggestion of right heart strain - In the setting of long car drive, in the setting of malignancy . - Venous Doppler study shows superficial thrombosis of right lesser saphenous vein. Left Baker's cyst noted. - Echocardiogram showed No evidence for cor pulmonale or RV dysfunction on echocardiogram.  -  heparin drip held for surgery, discussed with surgery, can be resumed tomorrow 8/12 at 10 AM giving no evidence of active bleed. - Patient was evaluated by pulmonology and cleared to undergo GI procedure in the setting of her acute PE. -  Pulmonary recommended placement of IVC filter due to concern for epidural clot burden and withholding systemic anticoagulation at the time of surgery. Patient  had IVC filter placement on 8/9. Bilateral upper extremity venous Doppler negative.    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 - Follow on CEA level - Patient to be seen by oncology. - Pain management and diet orders to be managed by surgery  History of GERD -Continue PPI. Status post EGD  History of diabetes mellitus type 2 without any complications - Initiated on SSI,  living metformin   History of essential hypertension - Continue monitoring blood pressure.  Microcytic anemia secondary to iron deficiency. - She was transfusedWire to admission . Hemoglobin has been stable for several days. Her ferritin is 6. TIBC 528. Vitamin B-12 257. Continue B-12 supplemeContinue to monitor  Morbid obesity - Body mass index is 42.48 kg/(m^2).     Consults: Gastroenterology Pulmonary General  surgery    Significant Diagnostic Studies:  Ir Ivc Filter Plmt / S&i /img Guid/mod Sed  11/03/2014   INDICATION: History of pulmonary embolism and cecal mass. Patient with impending colonic surgery and as such, request made for placement of a IVC filter for postoperative caval interruption.  EXAM: ULTRASOUND GUIDANCE FOR VASCULAR ACCESS  IVC CATHETERIZATION AND VENOGRAM  IVC FILTER INSERTION  COMPARISON:  CT the chest, abdomen and pelvis - 10/25/2014  MEDICATIONS: Fentanyl 100 mcg IV; Versed 2 mg IV  ANESTHESIA/SEDATION: Sedation Time  10 minutes  CONTRAST:  2m OMNIPAQUE IOHEXOL 300 MG/ML  SOLN  FLUOROSCOPY TIME:  1 minute, 24 seconds  COMPLICATIONS: None immediate  PROCEDURE: Informed consent  was obtained from the patient following explanation of the procedure, risks, benefits and alternatives. The patient understands, agrees and consents for the procedure. All questions were addressed. A time out was performed prior to the initiation of  the procedure.  Maximal barrier sterile technique utilized including caps, mask, sterile gowns, sterile gloves, large sterile drape, hand hygiene, and Betadine prep.  Under sterile condition and local anesthesia, right internal jugular venous access was performed with ultrasound. An ultrasound image was saved and sent to PACS. Over a guidewire, the IVC filter delivery sheath and inner dilator were advanced into the IVC just above the IVC bifurcation. Contrast injection was performed for an IVC venogram.  Through the delivery sheath, a retrievable Denali IVC filter was deployed below the level of the renal veins and above the IVC bifurcation. Limited post deployment venacavagram was performed.  The delivery sheath was removed and hemostasis was obtained with manual compression. A dressing was placed. The patient tolerated the procedure well without immediate post procedural complication.  FINDINGS: The IVC is patent. No evidence of thrombus, stenosis, or occlusion. No variant venous anatomy. Successful placement of the IVC filter below the level of the renal veins.  IMPRESSION: Successful ultrasound and fluoroscopically guided placement of an infrarenal retrievable IVC filter via right jugular approach.  This IVC filter is potentially retrievable. The patient will be assessed for filter retrieval by Interventional Radiology in approximately 8-12 weeks. Further recommendations regarding filter retrieval, continued surveillance or declaration of device permanence, will be made at that time.   Electronically Signed   By: Sandi Mariscal M.D.   On: 11/03/2014 11:05   Dg Chest Port 1 View  10/25/2014   CLINICAL DATA:  Shortness of breath, PE  EXAM: PORTABLE CHEST - 1 VIEW  COMPARISON:  None.  FINDINGS: Lungs are clear.  No pleural effusion or pneumothorax.  The heart is normal in size.  IMPRESSION: No evidence of acute cardiopulmonary disease.   Electronically Signed   By: Julian Hy M.D.   On: 10/25/2014 18:20    Dg Abd Portable 1v  11/02/2014   CLINICAL DATA:  53 year old female with acute abdominal pain. Initial encounter.  EXAM: PORTABLE ABDOMEN - 1 VIEW  COMPARISON:  None.  FINDINGS: Gas distended proximal and mid small bowel loops with paucity of distal small bowel gas suggestive of small bowel obstruction.  The possibility of free intraperitoneal air cannot be assessed on a supine view.  Tubal ligation clips are in place.  IMPRESSION: Gas distended proximal and mid small bowel loops with paucity of distal small bowel gas suggestive of small bowel obstruction.  The possibility of free intraperitoneal air cannot be assessed on a supine view.  These results will be called to the ordering clinician or representative by the Radiologist Assistant, and communication documented in the PACS or zVision Dashboard.   Electronically Signed   By: Genia Del M.D.   On: 11/02/2014 18:03     echocardiogram EF 60-65% Left ventricle: The cavity size was normal. Wall thickness was normal. Systolic function was normal. The estimated ejection fraction was in the range of 60% to 65%. Wall motion was normal; there were no regional wall motion abnormalities. - Left atrium: The atrium was mildly dilated.    Filed Weights   11/03/14 0515 11/04/14 0542 11/05/14 0557  Weight: 111.585 kg (246 lb) 112.81 kg (248 lb 11.2 oz) 112.3 kg (247 lb 9.2 oz)     Microbiology: Recent Results (from the past 240 hour(s))  Surgical pcr  screen     Status: None   Collection Time: 11/04/14  6:09 AM  Result Value Ref Range Status   MRSA, PCR NEGATIVE NEGATIVE Final   Staphylococcus aureus NEGATIVE NEGATIVE Final    Comment:        The Xpert SA Assay (FDA approved for NASAL specimens in patients over 29 years of age), is one component of a comprehensive surveillance program.  Test performance has been validated by [facility] for patients greater than or equal to 11 year old. It is not intended to diagnose infection nor  to guide or monitor treatment.        Blood Culture No results found for: SDES, [place], CULT, REPTSTATUS    Labs: Results for orders placed or performed during the hospital encounter of 10/25/14 (from the past 48 hour(s))  Glucose, capillary     Status: Abnormal   Collection Time: 11/03/14  9:12 PM  Result Value Ref Range   Glucose-Capillary 185 (H) 65 - 99 mg/dL  Heparin level (unfractionated)     Status: Abnormal   Collection Time: 11/04/14  4:50 AM  Result Value Ref Range   Heparin Unfractionated <0.10 (L) 0.30 - 0.70 IU/mL    Comment:        IF HEPARIN RESULTS ARE BELOW EXPECTED VALUES, AND PATIENT DOSAGE HAS BEEN CONFIRMED, SUGGEST FOLLOW UP TESTING OF ANTITHROMBIN III LEVELS.   CBC     Status: Abnormal   Collection Time: 11/04/14  4:50 AM  Result Value Ref Range   WBC 8.3 4.0 - 10.5 K/uL   RBC 3.99 3.87 - 5.11 MIL/uL   Hemoglobin 9.7 (L) 12.0 - 15.0 g/dL   HCT 32.0 (L) 36.0 - 46.0 %   MCV 80.2 78.0 - 100.0 fL   MCH 24.3 (L) 26.0 - 34.0 pg   MCHC 30.3 30.0 - 36.0 g/dL   RDW NOT CALCULATED 11.5 - 15.5 %   Platelets 307 150 - 400 K/uL  Basic metabolic panel     Status: Abnormal   Collection Time: 11/04/14  4:50 AM  Result Value Ref Range   Sodium 134 (L) 135 - 145 mmol/L   Potassium 3.8 3.5 - 5.1 mmol/L   Chloride 99 (L) 101 - 111 mmol/L   CO2 26 22 - 32 mmol/L   Glucose, Bld 192 (H) 65 - 99 mg/dL   BUN 7 6 - 20 mg/dL   Creatinine, Ser 1.02 (H) 0.44 - 1.00 mg/dL   Calcium 8.9 8.9 - 10.3 mg/dL   GFR calc non Af Amer >60 >60 mL/min   GFR calc Af Amer >60 >60 mL/min    Comment: (NOTE) The eGFR has been calculated using the CKD EPI equation. This calculation has not been validated in all clinical situations. eGFR's persistently <60 mL/min signify possible Chronic Kidney Disease.    Anion gap 9 5 - 15  Surgical pcr screen     Status: None   Collection Time: 11/04/14  6:09 AM  Result Value Ref Range   MRSA, PCR NEGATIVE NEGATIVE   Staphylococcus  aureus NEGATIVE NEGATIVE    Comment:        The Xpert SA Assay (FDA approved for NASAL specimens in patients over 1 years of age), is one component of a comprehensive surveillance program.  Test performance has been validated by [facility] for patients greater than or equal to 41 year old. It is not intended to diagnose infection nor to guide or monitor treatment.   Glucose, capillary  Status: Abnormal   Collection Time: 11/04/14  6:59 AM  Result Value Ref Range   Glucose-Capillary 212 (H) 65 - 99 mg/dL  Glucose, capillary     Status: Abnormal   Collection Time: 11/04/14 11:09 AM  Result Value Ref Range   Glucose-Capillary 226 (H) 65 - 99 mg/dL   Comment 1 Notify RN    Comment 2 Document in Chart   Glucose, capillary     Status: Abnormal   Collection Time: 11/04/14 12:48 PM  Result Value Ref Range   Glucose-Capillary 268 (H) 65 - 99 mg/dL  Glucose, capillary     Status: Abnormal   Collection Time: 11/04/14  4:41 PM  Result Value Ref Range   Glucose-Capillary 192 (H) 65 - 99 mg/dL   Comment 1 Notify RN    Comment 2 Document in Chart   Glucose, capillary     Status: Abnormal   Collection Time: 11/04/14  8:55 PM  Result Value Ref Range   Glucose-Capillary 124 (H) 65 - 99 mg/dL  CBC     Status: Abnormal   Collection Time: 11/05/14  4:04 AM  Result Value Ref Range   WBC 8.5 4.0 - 10.5 K/uL   RBC 3.76 (L) 3.87 - 5.11 MIL/uL   Hemoglobin 9.3 (L) 12.0 - 15.0 g/dL   HCT 31.0 (L) 36.0 - 46.0 %   MCV 82.4 78.0 - 100.0 fL   MCH 24.7 (L) 26.0 - 34.0 pg   MCHC 30.0 30.0 - 36.0 g/dL   RDW NOT CALCULATED 11.5 - 15.5 %   Platelets 265 150 - 400 K/uL  Comprehensive metabolic panel     Status: Abnormal   Collection Time: 11/05/14  4:04 AM  Result Value Ref Range   Sodium 137 135 - 145 mmol/L   Potassium 4.1 3.5 - 5.1 mmol/L   Chloride 103 101 - 111 mmol/L   CO2 25 22 - 32 mmol/L   Glucose, Bld 180 (H) 65 - 99 mg/dL   BUN <5 (L) 6 - 20 mg/dL   Creatinine, Ser 0.99 0.44 -  1.00 mg/dL   Calcium 8.5 (L) 8.9 - 10.3 mg/dL   Total Protein 6.0 (L) 6.5 - 8.1 g/dL   Albumin 2.7 (L) 3.5 - 5.0 g/dL   AST 18 15 - 41 U/L   ALT 13 (L) 14 - 54 U/L   Alkaline Phosphatase 68 38 - 126 U/L   Total Bilirubin 0.7 0.3 - 1.2 mg/dL   GFR calc non Af Amer >60 >60 mL/min   GFR calc Af Amer >60 >60 mL/min    Comment: (NOTE) The eGFR has been calculated using the CKD EPI equation. This calculation has not been validated in all clinical situations. eGFR's persistently <60 mL/min signify possible Chronic Kidney Disease.    Anion gap 9 5 - 15  Glucose, capillary     Status: Abnormal   Collection Time: 11/05/14  7:33 AM  Result Value Ref Range   Glucose-Capillary 174 (H) 65 - 99 mg/dL  Glucose, capillary     Status: Abnormal   Collection Time: 11/05/14 11:37 AM  Result Value Ref Range   Glucose-Capillary 180 (H) 65 - 99 mg/dL  Glucose, capillary     Status: Abnormal   Collection Time: 11/05/14  4:40 PM  Result Value Ref Range   Glucose-Capillary 144 (H) 65 - 99 mg/dL     Lipid Panel  No results found for: CHOL, TRIG, HDL, CHOLHDL, VLDL, LDLCALC, LDLDIRECT   No results found for: HGBA1C  Lab Results  Component Value Date   CREATININE 0.99 11/05/2014       Physical Exam:    Blood pressure 109/68, pulse 101, temperature 98.4 F (36.9 C), temperature source Oral, resp. rate 16, height '5\' 4"'  (1.626 m), weight 112.3 kg (247 lb 9.2 oz), SpO2 100 %. General appearance: alert, cooperative, appears stated age, no distress and morbidly obese Resp: Coarse breath sounds bilaterally with no wheezing, rales or rhonchi. Cardio: regular rate and rhythm, S1, S2 normal, no murmur, click, rub or gallop GI: Soft, obese, diffuse mild tenderness,  midline incision with honeycomb bandage in place Extremities: Nonpitting swelling noted in both lower extremities. Neurologic: No focal deficits    Discharge Instructions    Diet - low sodium heart healthy    Complete by:  As directed       Increase activity slowly    Complete by:  As directed            Follow-up Information    Follow up with [facility] C, MD. Schedule an appointment as soon as possible for a visit in 3 days.   Specialty:  Family Medicine   Contact information:   [address] [place] [address] [phone]      Time spent 30 minutes  [facility], Lashay Osborne MD Pager [phone] 11/05/2014, 5:21 PM

## 2014-11-05 NOTE — Progress Notes (Signed)
Called by RN that pt request her PRN treatment. Pt is clear and diminished no wheezes. Pt c/o not being able to cough effectively and says she cant get stuff up. I instructed pt to do some deep breathing and coughing and to use her IS as educated. Pt is stable no distress noted.

## 2014-11-05 NOTE — Progress Notes (Signed)
[facility] Progress Note  1 Day Post-Op  Subjective: Pt a bit miserable, upset with her husband right now.  She told her son not to bring him to the hospital.  No N/V, but c/o a lot of pain.  Not been OOB yet.  No flatus or BM yet.    Objective: Vital signs in last 24 hours: Temp:  [98.7 F (37.1 C)-100.6 F (38.1 C)] 100 F (37.8 C) (08/11 0557) Pulse Rate:  [98-105] 102 (08/11 0412) Resp:  [11-28] 22 (08/11 0800) BP: (93-135)/(48-65) 101/48 mmHg (08/11 0418) SpO2:  [93 %-100 %] 99 % (08/11 0921) Weight:  [112.3 kg (247 lb 9.2 oz)] 112.3 kg (247 lb 9.2 oz) (08/11 0557) Last BM Date: 11/03/14  Intake/Output from previous day: 08/10 0701 - 08/11 0700 In: 2520 [P.O.:120; I.V.:2400] Out: 1125 [Urine:1075; Blood:50] Intake/Output this shift:    PE: Gen:  Alert, NAD, pleasant Abd: Obese, soft, distended, tender all over, +BS, no HSM, incisions C/D/I, staples to midline wound honeycomb in place   Lab Results:   Recent Labs  11/04/14 0450 11/05/14 0404  WBC 8.3 8.5  HGB 9.7* 9.3*  HCT 32.0* 31.0*  PLT 307 265   BMET  Recent Labs  11/04/14 0450 11/05/14 0404  NA 134* 137  K 3.8 4.1  CL 99* 103  CO2 26 25  GLUCOSE 192* 180*  BUN 7 <5*  CREATININE 1.02* 0.99  CALCIUM 8.9 8.5*   PT/INR  Recent Labs  11/03/14 0533  LABPROT 13.9  INR 1.05   CMP     Component Value Date/Time   NA 137 11/05/2014 0404   K 4.1 11/05/2014 0404   CL 103 11/05/2014 0404   CO2 25 11/05/2014 0404   GLUCOSE 180* 11/05/2014 0404   BUN <5* 11/05/2014 0404   CREATININE 0.99 11/05/2014 0404   CALCIUM 8.5* 11/05/2014 0404   PROT 6.0* 11/05/2014 0404   ALBUMIN 2.7* 11/05/2014 0404   AST 18 11/05/2014 0404   ALT 13* 11/05/2014 0404   ALKPHOS 68 11/05/2014 0404   BILITOT 0.7 11/05/2014 0404   GFRNONAA >60 11/05/2014 0404   GFRAA >60 11/05/2014 0404   Lipase     Component Value Date/Time   LIPASE 36 10/05/2009 1910       Studies/Results: No results  found.  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 Johnette Abraham calling oncology consult, may need staging workup started -NPO, except sips of clears, until bowel function returns -Hold heparin until 11/06/14 at 10:00am -Don't see where CEA was ordered, so I ordered one -Daily labs, D/c foley -PT ordered -PCA, Add robaxin and IV tylenol, try to use less narcotics, abdominal binder, ice -Ambulate and IS -Remove dressings on POD #3  Pulmonary embolism ?hypercoaguable state from cancer vs long car ride -  Heparin infusion can resume at 10:00am on 11/06/14 (high bleeding risk), Elliquis on hold right now -s/p IVC filter Microcytic anemia secondary to iron deficiency/ABL anemia -s/p 2 units pRBC at AP DM/HLD/HTN/Obesity BMI 42 DVT proph - SCD's, early mobilization, holding heparin until tomorrow    LOS: 11 days    Nat Christen 11/05/2014, 10:52 AM Pager: [phone]

## 2014-11-05 NOTE — Op Note (Signed)
Krystal Campbell, Krystal Campbell                 ACCOUNT NO.:  [id number]  MEDICAL RECORD NO.:  [id number]  LOCATION:  [id number]                        FACILITY:  [facility]  PHYSICIAN:  [name] A. Melinda Gwinner, M.D.DATE OF BIRTH:  1960/10/10  DATE OF PROCEDURE: DATE OF DISCHARGE:                              OPERATIVE REPORT   PREOPERATIVE DIAGNOSIS:  Partially obstructing adenocarcinoma of the cecum.  POSTOPERATIVE DIAGNOSIS:  Partially obstructing adenocarcinoma of the cecum.  PROCEDURE:  Laparoscopic-assisted partial colectomy.  SURGEON:  [name] A. Ashrith Sagan, M.D.  ANESTHESIA:  General endotracheal anesthesia.  ESTIMATED BLOOD LOSS:  50 mL.  SPECIMENS:  Terminal ileum and ascending colon to Pathology.  DRAINS:  None.  LOCAL:  0.25% Sensorcaine local with epinephrine.  IV FLUIDS:  1700 mL.  INDICATIONS FOR PROCEDURE:  The patient is a 55 year old female who was admitted to the Medical Service due to DVT after a long trip to [place] with her family.  She was placed on anticoagulation, developed some GI bleeding.  She also had signs of partial bowel obstruction at her ileocecal bowel by CT scan.  Colonoscopy was done which showed a large fungating mass at the ileocecal valve/cecum region with partial obstruction.  She has been placed on anticoagulation for DVT and had a filter placed yesterday due to her high BMI and her history of GI bleeding.  She presents for elective partial colectomy to relieve her partial obstruction and to remove her colon cancer.  I had a long discussion with her about the procedure and the potential risk, benefits, and long-term expectations.  I discussed the risks of surgery in her case to include, but not be exclusive of bleeding, infection, anastomotic leak, small-bowel obstruction, the need for other surgical procedures, abscess, possible ostomy, death, propagation of her DVT and PE, complications related to her anticoagulation, pulmonary failure, renal failure, and  the need for other operations such as hernia repair down the road if she develops an incisional hernia.  After lengthy discussion of this with her and her husband, they wished to agree.  DESCRIPTION OF PROCEDURE:  The patient was met in the holding area where I answered questions for her and her husband this morning.  She was then taken back to the operating room, placed supine on the OR table.  After induction of general anesthesia, Foley catheter was placed sterilely. The left arm was tucked and the abdomen was then prepped and draped in sterile fashion.  She received 2 g of cefoxitin.  Time-out was done to verify proper patient, procedure.  A 5-mm Optiview port was placed in the left mid abdomen under direct vision with the entry into the abdominal cavity without injury.  Pneumoperitoneum was created to 15 mmHg of CO2 and a 5 mm laparoscope was placed.  Two additional 5 mm ports were placed in the left lower quadrant and epigastrium.  She was rolled head-down into her left.  The cecum was easily identified and a large fungating mass was noted within the cecum, causing a partial obstruction.  She had obvious lymphadenopathy as well on gross examination.  The right colon was mobilized along the white line of Toldt with the Harmonic scalpel.  The hepatic flexure  was mobilized in a similar fashion, taking care not to injure the duodenum.  The transverse colon was mobilized just distal to where the falciform ligament crosses over.  The entire colon was mobilized.  This was done with good hemostasis.  No evidence of bowel injury during this process.  I elected to place a GelPort hand port in the epigastrium through a 7 cm midline incision.  This was done, and the port was placed and I was able to put my hand in the abdominal cavity.  There was significant bulky adenopathy involving the mesentery.  The cecum was mobilized quite well, but due to her obesity, I want to make sure I would reach,  which I did through the [place].  Upon examination of the transverse colon, it felt well mobilized.  The duodenum was not injured and it did not appear upon examination.  At this point, I removed the specimen through the port wound protector/GelPort.  We then used a GIA-75 stapling device to divide the ascending colon at the junction between the ileocolonic vessel, blood supply region, and the middle colic vessels at the hepatic flexure.  The second staple load was used to ligate the terminal ileum 5 cm from the ileocecal valve.  She had bulky adenopathy and this was resected with the specimen.  The entire specimen was sent and passed off the field with hemostasis being achieved with the LigaSure and 2-0 Vicryl ties.  We then created a side-to-side functional end-to-end anastomosis using a GIA-75 stapling device and a TA-60 to close the common enterotomy.  The anastomosis was not twisted and had no tension upon it.  There was some oozing from 1 staple line that I controlled with 3-0 Vicryl.  The common mesenteric defect was left widely open. There appeared to be no twisting at the anastomosis or kinking of the bowel upon examination of this, this was replaced back in the abdominal cavity.  I then replaced the GelPort and reinsufflated the abdominal cavity.  I examined the anastomosis again to verify its location in the right upper quadrant and there was no kinking or twisting of the intestine that I could see.  I then irrigated the abdominal cavity with saline until clear.  There were no signs of bleeding, bowel injury, colon injury, or injury to any other internal viscera that I could see. I palpated the liver and examined and it felt no masses or evidence of metastatic disease at this point in time and she had no signs of carcinomatosis.  We then removed the GelPort at this point in time and closed the fascia with #1 PDS and skin with staples.  Port sites were removed and closed with  staples.  All final counts of sponge, needle, and instruments found to be correct at this portion of the case.  The patient was then extubated after placement of clean dressings and taken to recovery in satisfactory condition.     Kaitlinn Iversen A. Ardon Franklin, M.D.     TAC/MEDQ  D:  11/04/2014  T:  11/05/2014  Job:  [id number]

## 2014-11-06 DIAGNOSIS — I82812 Embolism and thrombosis of superficial veins of left lower extremities: Secondary | ICD-10-CM

## 2014-11-06 DIAGNOSIS — F508 Other eating disorders: Secondary | ICD-10-CM

## 2014-11-06 DIAGNOSIS — I2699 Other pulmonary embolism without acute cor pulmonale: Principal | ICD-10-CM

## 2014-11-06 DIAGNOSIS — C189 Malignant neoplasm of colon, unspecified: Secondary | ICD-10-CM

## 2014-11-06 DIAGNOSIS — C801 Malignant (primary) neoplasm, unspecified: Secondary | ICD-10-CM

## 2014-11-06 DIAGNOSIS — D509 Iron deficiency anemia, unspecified: Secondary | ICD-10-CM

## 2014-11-06 DIAGNOSIS — E46 Unspecified protein-calorie malnutrition: Secondary | ICD-10-CM

## 2014-11-06 DIAGNOSIS — D630 Anemia in neoplastic disease: Secondary | ICD-10-CM

## 2014-11-06 LAB — CBC
HEMATOCRIT: 29.8 % — AB (ref 36.0–46.0)
HEMOGLOBIN: 8.9 g/dL — AB (ref 12.0–15.0)
MCH: 24.3 pg — ABNORMAL LOW (ref 26.0–34.0)
MCHC: 29.9 g/dL — AB (ref 30.0–36.0)
MCV: 81.4 fL (ref 78.0–100.0)
Platelets: 279 10*3/uL (ref 150–400)
RBC: 3.66 MIL/uL — ABNORMAL LOW (ref 3.87–5.11)
WBC: 7.7 10*3/uL (ref 4.0–10.5)

## 2014-11-06 LAB — BASIC METABOLIC PANEL
ANION GAP: 8 (ref 5–15)
BUN: <5 mg/dL — ABNORMAL LOW (ref 6–20)
CALCIUM: 8.9 mg/dL (ref 8.9–10.3)
CO2: 26 mmol/L (ref 22–32)
CREATININE: 1 mg/dL (ref 0.44–1.00)
Chloride: 103 mmol/L (ref 101–111)
Glucose, Bld: 147 mg/dL — ABNORMAL HIGH (ref 65–99)
Potassium: 3.9 mmol/L (ref 3.5–5.1)
SODIUM: 137 mmol/L (ref 135–145)

## 2014-11-06 LAB — GLUCOSE, CAPILLARY
GLUCOSE-CAPILLARY: 173 mg/dL — AB (ref 65–99)
Glucose-Capillary: 180 mg/dL — ABNORMAL HIGH (ref 65–99)
Glucose-Capillary: 109 mg/dL — ABNORMAL HIGH (ref 65–99)
Glucose-Capillary: 187 mg/dL — ABNORMAL HIGH (ref 65–99)
Glucose-Capillary: 150 mg/dL — ABNORMAL HIGH (ref 65–99)

## 2014-11-06 LAB — CEA: CEA: 2.3 ng/mL (ref 0.0–4.7)

## 2014-11-06 MED ORDER — SODIUM CHLORIDE 0.9 % IV SOLN
510.0000 mg | Freq: Once | INTRAVENOUS | Status: AC
  Administered 2014-11-07: 510 mg via INTRAVENOUS
  Filled 2014-11-06: qty 17

## 2014-11-06 MED ORDER — METHOCARBAMOL 500 MG PO TABS
1000.0000 mg | ORAL_TABLET | Freq: Three times a day (TID) | ORAL | Status: DC
  Administered 2014-11-06 – 2014-11-08 (×4): 1000 mg via ORAL
  Filled 2014-11-06 (×5): qty 2

## 2014-11-06 MED ORDER — ENOXAPARIN SODIUM 120 MG/0.8ML ~~LOC~~ SOLN
1.0000 mg/kg | Freq: Two times a day (BID) | SUBCUTANEOUS | Status: DC
  Administered 2014-11-06 – 2014-11-09 (×7): 115 mg via SUBCUTANEOUS
  Filled 2014-11-06 (×8): qty 0.8

## 2014-11-06 NOTE — Consult Note (Signed)
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  She had initially presented on 7/15 to [facility] as instructed by her PCP due to symptomatic anemia, requiring transfusion of blood for a Hb of 6.7.  At the time, she was dyspneic, and had lightheadedness. She denied any bleeding issues, but she did report epigastric discomfort. Of note, patient has a history of pica with excessive ice chips consumption. No further workup was performed at the time. Outpatient follow up was recommended. She continued to be symptomatic,worsened after a long distance trip of 10 hrs to [place] with fatigue and non productive cough. She denied any chest pain or palpitations. She was afebrile. She was readmitted on 7/31 to [facility] for further investigation. CTA on 7/31 was remarkable for bilateral PE with evidence of right heart strain. Lower extremity dopplers were positive for DVT, superficial of right lesser saphenous vein. Left Baker's cyst noted. CT abdomen and pelvis on 7/31 revealed a cecal wall thickening and enlarged mesenteric lymph nodes suspicious for malignancy. After initiation of heparin, and because of these complex issues, she was transferred to [facility] for management. Colonoscopy on 8/5 showed large, lobulated, deeply ulcerated cecal mass with mucosal erosion. It appeared to be involving the ileal cecal valve. Several biopsies were obtained from this, with mild oozing. Path  report is consistent with adenocarcinoma. Upper endoscopy is negative for malignancy, remarkable for hiatal hernia. She received IVC filter placement on 8/9 prior to surgery.  On 8/10 she had lap assisted right colectomy (&appendectomy). Path report is pending. Today, she is seen at bedside in consultation. She has incisional pain. Her respiratory status is improving. Denies fevers, chills, night sweats, vision changes, or mucositis. Denies any chest pain or palpitations. Denies lower extremity swelling. Denies nausea, Appetite is decreased. To date she has lost about 20 lbs. Denies any dysuria. Denies abnormal skin rashes, or neuropathy, but does have pruritus. Denies any bleeding issues such as epistaxis, hematemesis, hematuria. Has not been ambulating. She denies any family history of colon cancer or clotting disorders. She denies ETOH or tobacco use. She is a diabetic. She had never had a GI evaluation prior to this admission.  We were asked to see this patient in consultation due to the above diagnoses     Past Medical History  Diagnosis Date   Diabetes mellitus without complication    Hypertension    Hyperlipidemia    Arthritis     in knees     MEDICATIONS:  Scheduled Meds:  antiseptic oral rinse  7 mL Mouth Rinse BID   Chlorhexidine Gluconate Cloth  6 each Topical Q0600   docusate sodium  100 mg Oral BID   feeding supplement  1 Container Oral TID BM   gabapentin  800 mg Oral TID   HYDROmorphone PCA 0.3 mg/mL   Intravenous 6 times per day   insulin aspart  0-20 Units Subcutaneous TID WC   insulin aspart  0-5 Units Subcutaneous QHS  methocarbamol (ROBAXIN)  IV  1,000 mg Intravenous 3 times per day   pantoprazole  40 mg Oral BID   simvastatin  20 mg Oral q1800   sodium chloride  3 mL Intravenous Q12H   vitamin B-12  1,000 mcg Oral Daily   Continuous Infusions:  0.9 % NaCl with KCl 20 mEq / L 100 mL/hr at 11/06/14 0127   PRN Meds:.acetaminophen **OR**  acetaminophen, albuterol, diphenhydrAMINE **OR** diphenhydrAMINE, HYDROcodone-acetaminophen, HYDROmorphone (DILAUDID) injection, iohexol, iohexol, LORazepam, naloxone **AND** sodium chloride, ondansetron (ZOFRAN) IV, ondansetron **OR** [DISCONTINUED] ondansetron (ZOFRAN) IV  ALLERGIES: No Known Allergies  Review of systems:   Constitutional: Denies fevers, chills or abnormal night sweats Eyes: Denies blurriness of vision, double vision or watery eyes Ears, nose, mouth, throat, and face: Denies mucositis or sore throat Respiratory: She had cough, dyspnea on admission, now improving. Cardiovascular: Denies palpitations, chest discomfort. She reports right lower extremity swelling at the site of her DVT. She also reports pain in her knee at the site of her knee replacement 1 year ago. Gastrointestinal:  Denies nausea or vomiting. She denied any change in bowel habits prior to admission, or visible blood. At this time, she has not had a bowel movement post op Skin: Denies abnormal skin rashes, but she reports pruritus  Lymphatics: Denies new lymphadenopathy or easy bruising. She reports new areas of bruising in arms after initiation of anticoagulation Neurological: Denies numbness, tingling or new weaknesses Behavioral/Psych: Mood is stable, no new changes  All other systems were reviewed with the patient and are negative.    Family History  Problem Relation Age of Onset   Healthy Mother     no clotting issues in either parents   Healthy Father   She has some family members with cancer of unknown type.   Past Surgical History  Procedure Laterality Date   Replacement total knee     Abdominal hysterectomy     Kidney surgery      when patient was a teenager   Joint replacement  4/15    Total right knee replacement; left knee scoped   Colonoscopy with propofol Left 10/30/2014    Procedure: COLONOSCOPY WITH PROPOFOL;  Surgeon: Ronald Lobo, MD;  Location: [facility] ENDOSCOPY;  Service:  Endoscopy;  Laterality: Left;   Esophagogastroduodenoscopy (egd) with propofol Left 10/30/2014    Procedure: ESOPHAGOGASTRODUODENOSCOPY (EGD) WITH PROPOFOL;  Surgeon: Ronald Lobo, MD;  Location: [facility] ENDOSCOPY;  Service: Endoscopy;  Laterality: Left;   Laparoscopic partial colectomy N/A 11/04/2014    Procedure: LAPAROSCOPIC PARTIAL COLECTOMY POSSIBLE OPEN;  Surgeon: Erroll Luna, MD;  Location: [facility] OR;  Service: General;  Laterality: N/A;    Social History   Social History   Marital Status: Married    Spouse Name: N/A   Number of Children: 3   Years of Education: N/A   Occupational History   Worked for [employer] for 10 years at the [employer].   Social History Main Topics   Smoking status: Former Smoker   Smokeless tobacco: Not on file   Alcohol Use: Yes     Comment: rarely   Drug Use: Not on file   Sexual Activity: Not on file     PHYSICAL EXAMINATION:  Filed Vitals:   11/06/14 0527  BP:   Pulse:   Temp:   Resp: 13      Intake/Output Summary (Last 24 hours) at 11/06/14 0649 Last data filed at 11/06/14 0624  Gross per 24 hour  Intake 1561.66 ml  Output  1625 ml  Net -63.34 ml     GENERAL:alert, no distress and comfortable SKIN: skin color, texture, turgor are normal, no rashes or significant lesions. Well healed right knee scar  EYES: normal, conjunctiva are pink and non-injected, sclera clear OROPHARYNX:no exudate, no erythema and lips, buccal mucosa, and tongue normal  NECK: supple, thyroid normal size, non-tender, without nodularity LYMPH:  no palpable lymphadenopathy in the cervical, axillary or inguinal LUNGS: clear to auscultation and percussion with normal breathing effort HEART: regular rate & rhythm and no murmurs and tense, trace right lower extremity edema. No cords. ABDOMEN: bandaged, incisional tenderness, decreased bowel sounds Musculoskeletal:no cyanosis of digits and no clubbing  PSYCH: alert & oriented x 3 with fluent  speech NEURO: no focal motor/sensory deficits  LABORATORY/RADIOLOGY DATA:   Recent Labs Lab 11/01/14 0347 11/02/14 0449 11/03/14 0533 11/04/14 0450 11/05/14 0404  WBC 7.5 7.5 10.7* 8.3 8.5  HGB 10.3* 10.0* 10.4* 9.7* 9.3*  HCT 35.1* 33.7* 34.4* 32.0* 31.0*  PLT 389 350 333 307 265  MCV 79.6 80.8 79.8 80.2 82.4  MCH 23.4* 24.0* 23.9* 24.3* 24.7*  MCHC 29.3* 29.7* 30.0 30.3 30.0  RDW 29.0* 28.7* NOT CALCULATED NOT CALCULATED NOT CALCULATED    CMP    Recent Labs Lab 11/02/14 0449 11/04/14 0450 11/05/14 0404 11/06/14 0454  NA 136 134* 137 137  K 4.3 3.8 4.1 3.9  CL 104 99* 103 103  CO2 23 26 25 26   GLUCOSE 193* 192* 180* 147*  BUN 9 7 <5* <5*  CREATININE 1.00 1.02* 0.99 1.00  CALCIUM 9.0 8.9 8.5* 8.9  AST 30  --  18  --   ALT 19  --  13*  --   ALKPHOS 73  --  68  --   BILITOT 0.4  --  0.7  --         Component Value Date/Time   BILITOT 0.7 11/05/2014 0404     Recent Labs Lab 11/03/14 0533  INR 1.05    Liver Function Tests:  Recent Labs Lab 11/02/14 0449 11/05/14 0404  AST 30 18  ALT 19 13*  ALKPHOS 73 68  BILITOT 0.4 0.7  PROT 6.3* 6.0*  ALBUMIN 3.0* 2.7*    CBG:  Recent Labs Lab 11/04/14 1641 11/04/14 2055 11/05/14 0733 11/05/14 1137 11/05/14 1640  GLUCAP 192* 124* 174* 180* 144*    Radiology Studies:  Ir Ivc Filter Plmt / S&i /img Guid/mod Sed  11/03/2014   INDICATION: History of pulmonary embolism and cecal mass. Patient with impending colonic surgery and as such, request made for placement of a IVC filter for postoperative caval interruption.  EXAM: ULTRASOUND GUIDANCE FOR VASCULAR ACCESS  IVC CATHETERIZATION AND VENOGRAM  IVC FILTER INSERTION  COMPARISON:  CT the chest, abdomen and pelvis - 10/25/2014  MEDICATIONS: Fentanyl 100 mcg IV; Versed 2 mg IV  ANESTHESIA/SEDATION: Sedation Time  10 minutes  CONTRAST:  68mL OMNIPAQUE IOHEXOL 300 MG/ML  SOLN  FLUOROSCOPY TIME:  1 minute, 24 seconds  COMPLICATIONS: None immediate  PROCEDURE:  Informed consent was obtained from the patient following explanation of the procedure, risks, benefits and alternatives. The patient understands, agrees and consents for the procedure. All questions were addressed. A time out was performed prior to the initiation of the procedure.  Maximal barrier sterile technique utilized including caps, mask, sterile gowns, sterile gloves, large sterile drape, hand hygiene, and Betadine prep.  Under sterile condition and local anesthesia, right internal jugular venous access was performed with ultrasound. An ultrasound  image was saved and sent to PACS. Over a guidewire, the IVC filter delivery sheath and inner dilator were advanced into the IVC just above the IVC bifurcation. Contrast injection was performed for an IVC venogram.  Through the delivery sheath, a retrievable Denali IVC filter was deployed below the level of the renal veins and above the IVC bifurcation. Limited post deployment venacavagram was performed.  The delivery sheath was removed and hemostasis was obtained with manual compression. A dressing was placed. The patient tolerated the procedure well without immediate post procedural complication.  FINDINGS: The IVC is patent. No evidence of thrombus, stenosis, or occlusion. No variant venous anatomy. Successful placement of the IVC filter below the level of the renal veins.  IMPRESSION: Successful ultrasound and fluoroscopically guided placement of an infrarenal retrievable IVC filter via right jugular approach.  This IVC filter is potentially retrievable. The patient will be assessed for filter retrieval by Interventional Radiology in approximately 8-12 weeks. Further recommendations regarding filter retrieval, continued surveillance or declaration of device permanence, will be made at that time.   Electronically Signed   By: Sandi Mariscal M.D.   On: 11/03/2014 11:05   Dg Chest Port 1 View  10/25/2014   CLINICAL DATA:  Shortness of breath, PE  EXAM: PORTABLE  CHEST - 1 VIEW  COMPARISON:  None.  FINDINGS: Lungs are clear.  No pleural effusion or pneumothorax.  The heart is normal in size.  IMPRESSION: No evidence of acute cardiopulmonary disease.   Electronically Signed   By: Julian Hy M.D.   On: 10/25/2014 18:20   Dg Abd Portable 1v  11/02/2014   CLINICAL DATA:  54 year old female with acute abdominal pain. Initial encounter.  EXAM: PORTABLE ABDOMEN - 1 VIEW  COMPARISON:  None.  FINDINGS: Gas distended proximal and mid small bowel loops with paucity of distal small bowel gas suggestive of small bowel obstruction.  The possibility of free intraperitoneal air cannot be assessed on a supine view.  Tubal ligation clips are in place.  IMPRESSION: Gas distended proximal and mid small bowel loops with paucity of distal small bowel gas suggestive of small bowel obstruction.  The possibility of free intraperitoneal air cannot be assessed on a supine view.  These results will be called to the ordering clinician or representative by the Radiologist Assistant, and communication documented in the PACS or zVision Dashboard.   Electronically Signed   By: Genia Del M.D.   On: 11/02/2014 18:03   echocardiogram EF 60-65% Left ventricle: The cavity size was normal. Wall thickness was normal. Systolic function was normal. The estimated ejection fraction was in the range of 60% to 65%. Wall motion was normal; there were no regional wall motion abnormalities. - Left atrium: The atrium was mildly dilated.   Patient: JERUSHA, REISING Collected: 10/30/2014 Client: [place] Accession: [id number] Received: 10/30/2014 Ronald Lobo DOB: 1960-10-20 Age: 53 Gender: F Reported: 11/02/2014 [address] Patient Ph: [phone] MRN #: [id number] [place], [place] [address] Visit #: [id number] Chart #: Phone:  Fax: CC: Jessica U. Vann, DO REPORT OF SURGICAL PATHOLOGY FINAL DIAGNOSIS Diagnosis Colon, biopsy, cecal mass - INVASIVE  ADENOCARCINOMA, SEE COMMENT. Microscopic Comment The invasive adenocarcinoma is arising in the background of tubulovillous adenoma associated high grade dysplasia. The case was discussed with Dr. Cristina Gong on 11/02/14. (CRR:ds 11/02/14) Mali RUND DO Pathologist, Electronic Signature (Case signed 11/02/2014)   ASSESSMENT AND PLAN:  Invasive Adenocarcinoma Likely of colon primary.  CT abdomen and pelvis on 7/31 revealed a  cecal wall thickening and enlarged mesenteric lymph nodes suspicious for malignancy. Colonoscopy on 8/5 showed large, lobulated, deeply ulcerated cecal mass with mucosal erosion. It appeared to be involving the ileal cecal valve. Several biopsies were obtained from this, with mild oozing. Path report is consistent with adenocarcinoma.  Upper endoscopy is negative for malignancy, remarkable for hiatal hernia.  On 8/10 she had lap assisted right colectomy (&appendectomy). Path report is pending. CEA 2.3 on 8/11 post op. 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 No transfusion is indicated at this time She received 1 dose of Feraheme on 7/17  For Ferritin on 7/15 was 6 with Iron of 16 and TIBC 528, percent sat 3. Vitamin B-12 257.  She may need further doses. This will be followed as outpatient  Monitor counts closely Transfuse blood to maintain a Hb of 8 g or if the patient is acutely bleeding   Right lower extremity DVT Pulmonary Embolism Likely due to malignancy and recent long distance travel, as well as obesity and sedentary lifestyle.  CTA on 7/31 was remarkable for bilateral PE with evidence of right heart strain.  Lower extremity dopplers were positive for DVT  right lesser saphenous vein. Left Baker's cyst noted. She initially received Heparin She had IVC filter placement on 8/9 Will continue follow up as  outpatient Consider PT/OT to help patient increase ambulation  Malnutrition Consider Nutrition evaluation  Full Code   Other medical issues as per admitting team    [facility] Wayne E, PA-C 11/06/2014, 6:49 AM  ADDENDUM:  I saw and examined the patient. I agree with the above.  She has 2 active issues. First issue is the stage IIIB colon cancer. She has a good performance status. As such, I did recommend that she undergo adjuvant chemotherapy. I think that she should do okay with adjuvant chemotherapy. I would recommend FOLFOX.  I would make sure that her colon cancer is sent off for the appropriate genetic studies. I will have to call pathology next week for this.  She also needs anticoagulation. I think that the fat that she has colon cancer certainly increases her risk of thromboembolic disease.  Given that she has pulmonary emboli, and the pad that she has underlying diabetes, I probably would recommend at least a year of anticoagulation. Anticoagulation could certainly be in the form of low molecular weight heparin. There is also more from patient regarding the use of the new or oral anticoagulants in patients with malignancy.  [place] It would be able lot easier for her to be treated at the [facility] cancer clinic. This is very convenient for her and this is what we want for our patients.  I will call the [facility] cancer clinic on Monday and we will see about having her referred up there.  Her postop CEA was 2.3. This should be a good prognostic factor.  She will need a Port-A-Cath. I will defer this until she is now outpatient.  I would not expect that she restart her treatments until after Labor Day.  It sounds like she will be going home soon. She is passing flatus. She is taking clear liquids. She has not had a bowel movement yet.  She is still iron deficient from my point of view. I would probably go ahead and give her another  dose of Feraheme while she is an  inpatient.  I answered all her questions. She is very nice.  I have appreciated the opportunity to have seen her in consultation.  She has a very strong faith. We will certainly pray for her and her family during this challenging time.  Lum Keas  Psalm 103:11

## 2014-11-06 NOTE — Progress Notes (Signed)
[facility] PROGRESS NOTE  Krystal Campbell OHY:[id number] DOB: June 04, 1960 DOA: 10/25/2014 PCP: Josem Kaufmann, MD  Brief narrative 53 year old obese female with history of hypertension, type 2 diabetes mellitus, anemia, who presented to [facility] with shortness of breath and cough after 10 Hour Car drive without any improvement on azithromycin. CT angiogram of the chest was positive for PE. Patient found to be anemic and was recently hospitalized, transfused and sent for outpatient GI evaluation. A colonoscopy done during this hospitalization for anemia showed a mass in the cecum with partially obstructing invasive adenocarcinoma on biopsy. Imaging also showed reactive lymphadenopathy. Patient seen by surgery and underwent laparoscopic partial colectomy.   Assessment/Plan: Acute pulmonary embolism without right heart strain Possibly in the setting of DVT with recent long car drive and underlying malignancy. Doppler Lower extremity shows refill thrombosis of right lesser saphenous vein. Echo without RV strain. Given her anticoagulation would be held during surgery pulmonary recommended on having an IVC filter which was placed on 8/9. Bilateral upper extremity venous Doppler was negative for DVT. Patient has been resumed on Lovenox and 8/12. Will check with case manager on insurance coverage. Could possibly discharge her on Lovenox alone. (Plan to treat for at least 6 months)  Partially obstruction adenocarcinoma of the cecum with reactive lymphadenopathy - s/p lap assisted right colectomy (&appendectomy) 8/11 - Biopsy on 8/5 by colonoscopy significant for invasive adenocarcinoma. Follow surgical pathology. - Follow on CEA level normal. Oncology consulted. Will recommend further upon biopsy results. -Continue PCA.  History of GERD -Continue PPI. Status post EGD  History of diabetes mellitus type 2 without any complications - Initiated on SSI,holding metformin   History of essential  hypertension - Continue monitoring blood pressure.  Microcytic anemia secondary to iron deficiency. -  transfused prior  to admission . Hemoglobin has been stable for several days. Her ferritin is 6. TIBC 528. Vitamin B-12 257. Continue B12 supplement.  Morbid obesity - Body mass index is 42.48 kg/(m^2).   Diet: clear Liquid   DVT prophylaxis:  Therapeutic Subcutaneous Lovenox  Code Status: Full code Family Communication: None at bedside Disposition Plan: Home possibly on 8/15   Consultants:  [place] [facility]  Oncology ( Dr Marin Olp)  Pulmonary  Procedures:  2-D echo  Doppler venous upper and lower extremities  IVC filter on 8/9  Laparoscopy with partial colectomy on 8/11  Antibiotics:  None  HPI/Subjective: Seen and examined. Complains of abdominal pain. Tearful abut her illness  Objective: Filed Vitals:   11/06/14 1246  BP:   Pulse:   Temp:   Resp: 19    Intake/Output Summary (Last 24 hours) at 11/06/14 1516 Last data filed at 11/06/14 0818  Gross per 24 hour  Intake 1561.66 ml  Output   2025 ml  Net -463.34 ml   Filed Weights   11/04/14 0542 11/05/14 0557 11/06/14 0411  Weight: 112.81 kg (248 lb 11.2 oz) 112.3 kg (247 lb 9.2 oz) 111.8 kg (246 lb 7.6 oz)    Exam:   General:  Middle aged obese female in no acute distress, tearful about her current situation  HEENT: Pallor, moist oral mucosa, supple neck  Chest: Clear to auscultation bilaterally  CVS: Normal S1 and S2, no murmurs rub or gallop  GI: Laproscopic site clean, bowel sounds present, nondistended, tender  Musculoskeletal: Warm, no edema   CNS: Alert and oriented    Data Reviewed: Basic Metabolic Panel:  Recent Labs Lab 11/02/14 0449 11/04/14 0450 11/05/14 0404 11/06/14 0454  NA 136 134*  137 137  K 4.3 3.8 4.1 3.9  CL 104 99* 103 103  CO2 23 26 25 26   GLUCOSE 193* 192* 180* 147*  BUN 9 7 <5* <5*  CREATININE 1.00 1.02* 0.99 1.00  CALCIUM 9.0 8.9 8.5* 8.9    Liver Function Tests:  Recent Labs Lab 11/02/14 0449 11/05/14 0404  AST 30 18  ALT 19 13*  ALKPHOS 73 68  BILITOT 0.4 0.7  PROT 6.3* 6.0*  ALBUMIN 3.0* 2.7*   No results for input(s): LIPASE, AMYLASE in the last 168 hours. No results for input(s): AMMONIA in the last 168 hours. CBC:  Recent Labs Lab 11/02/14 0449 11/03/14 0533 11/04/14 0450 11/05/14 0404 11/06/14 0454  WBC 7.5 10.7* 8.3 8.5 7.7  HGB 10.0* 10.4* 9.7* 9.3* 8.9*  HCT 33.7* 34.4* 32.0* 31.0* 29.8*  MCV 80.8 79.8 80.2 82.4 81.4  PLT 350 333 307 265 279   Cardiac Enzymes: No results for input(s): CKTOTAL, CKMB, CKMBINDEX, TROPONINI in the last 168 hours. BNP (last 3 results) No results for input(s): BNP in the last 8760 hours.  ProBNP (last 3 results) No results for input(s): PROBNP in the last 8760 hours.  CBG:  Recent Labs Lab 11/05/14 0733 11/05/14 1137 11/05/14 1640 11/06/14 0724 11/06/14 1123  GLUCAP 174* 180* 144* 173* 109*    Recent Results (from the past 240 hour(s))  Surgical pcr screen     Status: None   Collection Time: 11/04/14  6:09 AM  Result Value Ref Range Status   MRSA, PCR NEGATIVE NEGATIVE Final   Staphylococcus aureus NEGATIVE NEGATIVE Final    Comment:        The Xpert SA Assay (FDA approved for NASAL specimens in patients over 46 years of age), is one component of a comprehensive surveillance program.  Test performance has been validated by [facility] for patients greater than or equal to 68 year old. It is not intended to diagnose infection nor to guide or monitor treatment.      Studies: No results found.  Scheduled Meds: . antiseptic oral rinse  7 mL Mouth Rinse BID  . Chlorhexidine Gluconate Cloth  6 each Topical Q0600  . docusate sodium  100 mg Oral BID  . enoxaparin (LOVENOX) injection  1 mg/kg Subcutaneous BID  . feeding supplement  1 Container Oral TID BM  . gabapentin  800 mg Oral TID  . HYDROmorphone PCA 0.3 mg/mL   Intravenous 6 times per  day  . insulin aspart  0-20 Units Subcutaneous TID WC  . insulin aspart  0-5 Units Subcutaneous QHS  . methocarbamol (ROBAXIN)  IV  1,000 mg Intravenous 3 times per day  . pantoprazole  40 mg Oral BID  . simvastatin  20 mg Oral q1800  . sodium chloride  3 mL Intravenous Q12H  . vitamin B-12  1,000 mcg Oral Daily   Continuous Infusions: . 0.9 % NaCl with KCl 20 mEq / L 100 mL/hr at 11/06/14 0127      Time spent: [employer], [place]  [facility] Pager [phone]. If 7PM-7AM, please contact night-coverage at [email or web address], password [facility] 11/06/2014, 3:16 PM  LOS: 12 days

## 2014-11-06 NOTE — Progress Notes (Signed)
ANTICOAGULATION CONSULT NOTE - Follow Up Consult  Pharmacy Consult for Lovenox  Indication: pulmonary embolus  No Known Allergies  Patient Measurements: Height: 5\' 4"  (162.6 cm) Weight: 246 lb 7.6 oz (111.8 kg) IBW/kg (Calculated) : 54.7  Vital Signs: Temp: 98.7 F (37.1 C) (08/12 0817) Temp Source: Oral (08/12 0817) BP: 148/67 mmHg (08/12 0817) Pulse Rate: 96 (08/12 0817)  Labs:  Recent Labs  11/04/14 0450 11/05/14 0404 11/06/14 0454  HGB 9.7* 9.3* 8.9*  HCT 32.0* 31.0* 29.8*  PLT 307 265 279  HEPARINUNFRC <0.10*  --   --   CREATININE 1.02* 0.99 1.00    Estimated Creatinine Clearance: 78.7 mL/min (by C-G formula based on Cr of 1).   Assessment: 54 yo F with new PE. Pt was initiated on heparin in anticipation of surgery.    Underwent colonoscopy on 8/5 that revealed a cecal mass, several biopsies taken. Pt is s/p IVC filter placement 8/9 and surgery 8/10.  OK with surgery to restart anticoagulation today.  Will use Lovenox for ease of transition at discharge.  MD considering long-term Lovenox vs Coumadin in setting of malignancy.  Goal of Therapy:  Anti-Xa level 0.6-1 units/ml 4hrs after LMWH dose given Monitor platelets by anticoagulation protocol: Yes   Plan:  Lovenox 110 mg SQ Q12h CBC q72 hours Monitor for bleeding Follow up long-term AC plan  [employer], Pharm.D., BCPS Clinical Pharmacist Pager [phone] 11/06/2014 11:02 AM

## 2014-11-06 NOTE — Progress Notes (Signed)
Patient ID: Denelda Akerley, female   DOB: 01-16-1961, 54 y.o.   MRN: [id number] 2 Days Post-Op  Subjective: Pt tearful and feels overwhelmed as husband had an MI in march of this year and she takes care of him.   Mobilized yesterday well.  Pain controlled on PCA.  No nausea, no flatus  Objective: Vital signs in last 24 hours: Temp:  [98.4 F (36.9 C)-99.1 F (37.3 C)] 98.7 F (37.1 C) (08/12 0817) Pulse Rate:  [90-103] 96 (08/12 0817) Resp:  [13-22] 13 (08/12 0527) BP: (107-148)/(49-68) 148/67 mmHg (08/12 0817) SpO2:  [93 %-100 %] 100 % (08/12 0817) Weight:  [111.8 kg (246 lb 7.6 oz)] 111.8 kg (246 lb 7.6 oz) (08/12 0411) Last BM Date: 11/03/14  Intake/Output from previous day: 08/11 0701 - 08/12 0700 In: 1561.7 [P.O.:480; I.V.:921.7; IV Piggyback:160] Out: 1625 [Urine:1625] Intake/Output this shift: Total I/O In: -  Out: 400 [Urine:400]  PE: Abd: soft, hypoactive BS, incisions are all c/d/i, obese Heart: regular  Lab Results:   Recent Labs  11/05/14 0404 11/06/14 0454  WBC 8.5 7.7  HGB 9.3* 8.9*  HCT 31.0* 29.8*  PLT 265 279   BMET  Recent Labs  11/05/14 0404 11/06/14 0454  NA 137 137  K 4.1 3.9  CL 103 103  CO2 25 26  GLUCOSE 180* 147*  BUN <5* <5*  CREATININE 0.99 1.00  CALCIUM 8.5* 8.9   PT/INR No results for input(s): LABPROT, INR in the last 72 hours. CMP     Component Value Date/Time   NA 137 11/06/2014 0454   K 3.9 11/06/2014 0454   CL 103 11/06/2014 0454   CO2 26 11/06/2014 0454   GLUCOSE 147* 11/06/2014 0454   BUN <5* 11/06/2014 0454   CREATININE 1.00 11/06/2014 0454   CALCIUM 8.9 11/06/2014 0454   PROT 6.0* 11/05/2014 0404   ALBUMIN 2.7* 11/05/2014 0404   AST 18 11/05/2014 0404   ALT 13* 11/05/2014 0404   ALKPHOS 68 11/05/2014 0404   BILITOT 0.7 11/05/2014 0404   GFRNONAA >60 11/06/2014 0454   GFRAA >60 11/06/2014 0454   Lipase     Component Value Date/Time   LIPASE 36 10/05/2009 1910       Studies/Results: No results  found.  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 POD #2 s/p lap assisted right colectomy (&appendectomy) -Pain control, antiemetics, IVF -Await specimen pathology report, but concerned for stage 3-4 -oncology saw patient this morning -will give clear liquids -ok to start lovenox -CEA 2.3 -PT ordered -PCA, Add robaxin and IV tylenol, try to use less narcotics, abdominal binder, ice -Ambulate and IS -Remove dressings on POD #3  Pulmonary embolism ?hypercoaguable state from cancer vs long car ride -ok to start lovenox -s/p IVC filter Microcytic anemia secondary to iron deficiency/ABL anemia -s/p 2 units pRBC at AP DM/HLD/HTN/Obesity BMI 42 DVT proph - SCD's, early mobilization, holding heparin  until tomorrow  LOS: 12 days    Markey Deady E 11/06/2014, 10:49 AM Pager: [phone]

## 2014-11-07 ENCOUNTER — Inpatient Hospital Stay (HOSPITAL_COMMUNITY): Payer: 59

## 2014-11-07 LAB — GLUCOSE, CAPILLARY
GLUCOSE-CAPILLARY: 210 mg/dL — AB (ref 65–99)
Glucose-Capillary: 124 mg/dL — ABNORMAL HIGH (ref 65–99)
Glucose-Capillary: 100 mg/dL — ABNORMAL HIGH (ref 65–99)
Glucose-Capillary: 124 mg/dL — ABNORMAL HIGH (ref 65–99)

## 2014-11-07 LAB — BASIC METABOLIC PANEL
Anion gap: 11 (ref 5–15)
BUN: <5 mg/dL — ABNORMAL LOW (ref 6–20)
CHLORIDE: 104 mmol/L (ref 101–111)
CO2: 20 mmol/L — AB (ref 22–32)
Calcium: 8.6 mg/dL — ABNORMAL LOW (ref 8.9–10.3)
Creatinine, Ser: 0.9 mg/dL (ref 0.44–1.00)
GFR calc Af Amer: >60 mL/min (ref >60)
GFR calc non Af Amer: >60 mL/min (ref >60)
GLUCOSE: 182 mg/dL — AB (ref 65–99)
Potassium: 4.6 mmol/L (ref 3.5–5.1)
SODIUM: 135 mmol/L (ref 135–145)

## 2014-11-07 LAB — CBC
HEMATOCRIT: 30.1 % — AB (ref 36.0–46.0)
HEMOGLOBIN: 9 g/dL — AB (ref 12.0–15.0)
MCH: 24.1 pg — ABNORMAL LOW (ref 26.0–34.0)
MCHC: 29.9 g/dL — ABNORMAL LOW (ref 30.0–36.0)
MCV: 80.7 fL (ref 78.0–100.0)
Platelets: 295 10*3/uL (ref 150–400)
RBC: 3.73 MIL/uL — ABNORMAL LOW (ref 3.87–5.11)
WBC: 9.8 10*3/uL (ref 4.0–10.5)

## 2014-11-07 NOTE — Progress Notes (Signed)
Patient complained of nausea and vomited x 1. PRN Zofran IV 4 mg given @ 5597; result pending.  Patient continues to have poor PO intake.

## 2014-11-07 NOTE — Progress Notes (Signed)
[facility] PROGRESS NOTE  Krystal Campbell QQP:[id number] DOB: [birth year] DOA: 10/25/2014 PCP: Josem Kaufmann, MD  Brief narrative 54 year old obese female with history of hypertension, type 2 diabetes mellitus, anemia, who presented to [facility] with shortness of breath and cough after 10 Hour Car drive without any improvement on azithromycin. CT angiogram of the chest was positive for PE. Patient found to be anemic and was recently hospitalized, transfused and sent for outpatient GI evaluation. A colonoscopy done during this hospitalization for anemia showed a mass in the cecum with partially obstructing invasive adenocarcinoma on biopsy. Imaging also showed reactive lymphadenopathy. Patient seen by surgery and underwent laparoscopic partial colectomy.   Assessment/Plan: Acute pulmonary embolism  Possibly in the setting of DVT with recent long car drive and underlying malignancy. Doppler Lower extremity shows  thrombosis of right lesser saphenous vein. Echo without RV strain. Given her anticoagulation would be held during surgery pulmonary recommended on having an IVC filter which was placed on 8/9. Bilateral upper extremity venous Doppler was negative for DVT. Patient has been resumed on Lovenox and 8/12.  case manager consulted on insurance coverage. Could possibly discharge her on Lovenox alone. 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 surgical pathology confirms same with lymphovascular invasion. Metastatic LN involvement in 5/16 lymph nodes with clear  - CEA normal. Oncology recommends adjuvant chemotherapy with FOLFOX. Dr Marin Olp plans on referring her to [facility]. She will need a port A cath as outpt. -still requiring  dilaudid PCA for post op pain.  History of GERD -Continue PPI. Status post EGD showing esophageal reflux and hiatal hernia.  Post op ileus  pt having epigastric pain and vomited x 1 this am. Check kub. continue clears.   History of diabetes mellitus type 2 without any complications - Initiated on SSI,holding metformin   History of essential hypertension stable  Microcytic anemia secondary to iron deficiency. -  transfused prior  to admission . Hemoglobin  stable. Her ferritin is 6. TIBC 528. Vitamin B-12 257. Continue B12 supplement. Received IV iron on 8/12.  Morbid obesity - Body mass index is 42.48 kg/(m^2).   Diet: clear Liquid   DVT prophylaxis:  Therapeutic Subcutaneous Lovenox  Code Status: Full code Family Communication: None at bedside Disposition Plan: Home possibly on 8/15   Consultants:  [facility] surgery  Oncology ( Dr Marin Olp)  Pulmonary  Procedures:  2-D echo  Doppler venous upper and lower extremities  IVC filter on 8/9  Laparoscopy with partial colectomy on 8/11  Antibiotics:  None  HPI/Subjective: Seen and examined. Still having abdominal pain ( epigastric and surgical site) and had an episode fo vomiting of dark liquid this am. Surgical pathology result discussed. Pt told the surgeon she wasn't able to sleep due to construction noise and surgeon recommends to transfer her [employer] unit.  Objective: Filed Vitals:   11/07/14 0800  BP:   Pulse:   Temp:   Resp: 22    Intake/Output Summary (Last 24 hours) at 11/07/14 0924 Last data filed at 11/06/14 2219  Gross per 24 hour  Intake      3 ml  Output      0 ml  Net      3 ml   Filed Weights   11/04/14 0542 11/05/14 0557 11/06/14 0411  Weight: 112.81 kg (248 lb 11.2 oz) 112.3 kg (247 lb 9.2 oz) 111.8 kg (246 lb 7.6 oz)    Exam:   General:  Sleepy, not in dstress  HEENT: Pallor+, moist oral mucosa, supple neck  Chest: Clear to auscultation bilaterally  CVS: Normal S1  and S2, no murmurs rub or gallop  GI: Laproscopic site clean, bowel sounds sluggish, nondistended, tender  Musculoskeletal: Warm, no edema   CNS: Alert and oriented    Data Reviewed: Basic Metabolic Panel:  Recent Labs Lab 11/02/14 0449 11/04/14 0450 11/05/14 0404 11/06/14 0454 11/07/14 0540  NA 136 134* 137 137 135  K 4.3 3.8 4.1 3.9 4.6  CL 104 99* 103 103 104  CO2 23 26 25 26  20*  GLUCOSE 193* 192* 180* 147* 182*  BUN 9 7 <5* <5* <5*  CREATININE 1.00 1.02* 0.99 1.00 0.90  CALCIUM 9.0 8.9 8.5* 8.9 8.6*   Liver Function Tests:  Recent Labs Lab 11/02/14 0449 11/05/14 0404  AST 30 18  ALT 19 13*  ALKPHOS 73 68  BILITOT 0.4 0.7  PROT 6.3* 6.0*  ALBUMIN 3.0* 2.7*   No results for input(s): LIPASE, AMYLASE in the last 168 hours. No results for input(s): AMMONIA in the last 168 hours. CBC:  Recent Labs Lab 11/02/14 0449 11/03/14 0533 11/04/14 0450 11/05/14 0404 11/06/14 0454  WBC 7.5 10.7* 8.3 8.5 7.7  HGB 10.0* 10.4* 9.7* 9.3* 8.9*  HCT 33.7* 34.4* 32.0* 31.0* 29.8*  MCV 80.8 79.8 80.2 82.4 81.4  PLT 350 333 307 265 279   Cardiac Enzymes: No results for input(s): CKTOTAL, CKMB, CKMBINDEX, TROPONINI in the last 168 hours. BNP (last 3 results) No results for input(s): BNP in the last 8760 hours.  ProBNP (last 3 results) No results for input(s): PROBNP in the last 8760 hours.  CBG:  Recent Labs Lab 11/06/14 0724 11/06/14 1123 11/06/14 1619 11/06/14 2129 11/07/14 0734  GLUCAP 173* 109* 187* 150* 210*    Recent Results (from the past 240 hour(s))  Surgical pcr screen     Status: None   Collection Time: 11/04/14  6:09 AM  Result Value Ref Range Status   MRSA, PCR NEGATIVE NEGATIVE Final   Staphylococcus aureus NEGATIVE NEGATIVE Final    Comment:        The Xpert SA Assay (FDA approved for NASAL specimens in patients over 29 years of age), is one component of a comprehensive surveillance program.  Test performance has been validated by  [facility] for patients greater than or equal to 53 year old. It is not intended to diagnose infection nor to guide or monitor treatment.      Studies: No results found.  Scheduled Meds: . antiseptic oral rinse  7 mL Mouth Rinse BID  . Chlorhexidine Gluconate Cloth  6 each Topical Q0600  . docusate sodium  100 mg Oral BID  . enoxaparin (LOVENOX) injection  1 mg/kg Subcutaneous BID  . feeding supplement  1 Container Oral TID BM  . gabapentin  800 mg Oral TID  . HYDROmorphone PCA 0.3 mg/mL   Intravenous 6 times per day  . insulin aspart  0-20 Units Subcutaneous TID WC  . insulin aspart  0-5 Units Subcutaneous QHS  . methocarbamol  1,000 mg Oral 3 times per day  . pantoprazole  40 mg Oral BID  . simvastatin  20 mg Oral q1800  . sodium chloride  3 mL Intravenous Q12H  . vitamin B-12  1,000 mcg Oral Daily   Continuous  Infusions: . 0.9 % NaCl with KCl 20 mEq / L 100 mL/hr at 11/06/14 0127      Time spent: [place], [place]  [facility] Pager [phone]. If 7PM-7AM, please contact night-coverage at [email or web address], password [facility] 11/07/2014, 9:24 AM  LOS: 13 days

## 2014-11-07 NOTE — Progress Notes (Signed)
UR COMPLETED  

## 2014-11-07 NOTE — Progress Notes (Signed)
3 Days Post-Op  Subjective: Pt tearful due to constant buzzing from construction noise Vomited overnight once  No flatus or BM  No SOB or CP   Objective: Vital signs in last 24 hours: Temp:  [98 F (36.7 C)-100.3 F (37.9 C)] 98.3 F (36.8 C) (08/13 0554) Pulse Rate:  [90-102] 90 (08/13 0554) Resp:  [16-24] 16 (08/13 0554) BP: (114-135)/(54-82) 135/82 mmHg (08/13 0554) SpO2:  [98 %-100 %] 100 % (08/13 0554) Last BM Date: 11/03/14  Intake/Output from previous day: 08/12 0701 - 08/13 0700 In: 3 [I.V.:3] Out: 400 [Urine:400] Intake/Output this shift:    General appearance: TEARFUL  Incision/Wound: incision clean dry intact  Port site ok soft not very tender quiet   Lab Results:   Recent Labs  11/05/14 0404 11/06/14 0454  WBC 8.5 7.7  HGB 9.3* 8.9*  HCT 31.0* 29.8*  PLT 265 279   BMET  Recent Labs  11/06/14 0454 11/07/14 0540  NA 137 135  K 3.9 4.6  CL 103 104  CO2 26 20*  GLUCOSE 147* 182*  BUN <5* <5*  CREATININE 1.00 0.90  CALCIUM 8.9 8.6*   PT/INR No results for input(s): LABPROT, INR in the last 72 hours. ABG No results for input(s): PHART, HCO3 in the last 72 hours.  Invalid input(s): PCO2, PO2  Studies/Results: No results found.  Anti-infectives: Anti-infectives    Start     Dose/Rate Route Frequency Ordered Stop   11/04/14 1200  cefOXitin (MEFOXIN) 2 g in dextrose 5 % 50 mL IVPB  Status:  Discontinued     2 g 100 mL/hr over 30 Minutes Intravenous 4 times per day 11/04/14 0906 11/04/14 0909   11/04/14 0915  cefOXitin (MEFOXIN) 2 g in dextrose 5 % 50 mL IVPB  Status:  Discontinued     2 g 100 mL/hr over 30 Minutes Intravenous To Surgery 11/04/14 4970 11/04/14 1238      Assessment/Plan: s/p Procedure(s): LAPAROSCOPIC PARTIAL COLECTOMY POSSIBLE OPEN (N/A) Patient Active Problem List   Diagnosis Date Noted  . Acute pulmonary embolism 10/26/2014  . Iron deficiency anemia 10/26/2014  . Pulmonary emboli 10/25/2014  . Cecal lesion  10/25/2014  . Anxiety state 10/25/2014  . Absolute anemia   . GERD (gastroesophageal reflux disease) 10/10/2014  . Microcytic anemia 10/09/2014  . Diabetes 10/09/2014  . Hypertension 10/09/2014  Move to [place] PATIENTS ANXIETY ABOUT THIS DVT/PE per medicine on lovanox and has filter Ileus following gastrointestinal surgery Check KUB labs  Keep diet on clears for now  LOS: 13 days    Krystal Campbell A. 11/07/2014

## 2014-11-07 NOTE — Progress Notes (Signed)
Patient did not ambulate as ordered due to pain and weakness.  Will continue to encourage patient to ambulate.

## 2014-11-08 DIAGNOSIS — K913 Postprocedural intestinal obstruction: Secondary | ICD-10-CM

## 2014-11-08 LAB — BASIC METABOLIC PANEL
ANION GAP: 10 (ref 5–15)
CO2: 22 mmol/L (ref 22–32)
CREATININE: 0.94 mg/dL (ref 0.44–1.00)
Calcium: 8.3 mg/dL — ABNORMAL LOW (ref 8.9–10.3)
Chloride: 105 mmol/L (ref 101–111)
GFR calc non Af Amer: >60 mL/min (ref >60)
GLUCOSE: 129 mg/dL — AB (ref 65–99)
POTASSIUM: 3.8 mmol/L (ref 3.5–5.1)
SODIUM: 137 mmol/L (ref 135–145)

## 2014-11-08 LAB — CBC
HEMATOCRIT: 28.2 % — AB (ref 36.0–46.0)
Hemoglobin: 8.5 g/dL — ABNORMAL LOW (ref 12.0–15.0)
MCH: 24.6 pg — AB (ref 26.0–34.0)
MCHC: 30.1 g/dL (ref 30.0–36.0)
MCV: 81.5 fL (ref 78.0–100.0)
PLATELETS: 343 10*3/uL (ref 150–400)
RBC: 3.46 MIL/uL — ABNORMAL LOW (ref 3.87–5.11)
WBC: 6.8 10*3/uL (ref 4.0–10.5)

## 2014-11-08 LAB — GLUCOSE, CAPILLARY
GLUCOSE-CAPILLARY: 120 mg/dL — AB (ref 65–99)
Glucose-Capillary: 114 mg/dL — ABNORMAL HIGH (ref 65–99)
Glucose-Capillary: 120 mg/dL — ABNORMAL HIGH (ref 65–99)
Glucose-Capillary: 96 mg/dL (ref 65–99)

## 2014-11-08 MED ORDER — HYDROMORPHONE HCL 1 MG/ML IJ SOLN
2.0000 mg | INTRAMUSCULAR | Status: DC | PRN
  Administered 2014-11-08 – 2014-11-09 (×3): 2 mg via INTRAVENOUS
  Filled 2014-11-08 (×3): qty 2

## 2014-11-08 MED ORDER — METHOCARBAMOL 1000 MG/10ML IJ SOLN
1000.0000 mg | Freq: Three times a day (TID) | INTRAVENOUS | Status: DC
  Administered 2014-11-08 – 2014-11-09 (×4): 1000 mg via INTRAVENOUS
  Filled 2014-11-08 (×8): qty 10

## 2014-11-08 MED ORDER — PANTOPRAZOLE SODIUM 40 MG IV SOLR
40.0000 mg | Freq: Two times a day (BID) | INTRAVENOUS | Status: DC
  Administered 2014-11-08 – 2014-11-10 (×5): 40 mg via INTRAVENOUS
  Filled 2014-11-08 (×5): qty 40

## 2014-11-08 MED ORDER — METOCLOPRAMIDE HCL 5 MG/ML IJ SOLN
5.0000 mg | Freq: Three times a day (TID) | INTRAMUSCULAR | Status: DC
  Administered 2014-11-08 – 2014-11-10 (×7): 5 mg via INTRAVENOUS
  Filled 2014-11-08 (×7): qty 2

## 2014-11-08 NOTE — Progress Notes (Signed)
[facility]/Romelle Re[facility] Progress Note 4 Days Post-Op  Subjective: Patient says she vomited again this AM, not a lot.  Small amounts of flatus  Objective: Vital signs in last 24 hours: Temp:  [98 F (36.7 C)-98.7 F (37.1 C)] 98.7 F (37.1 C) (08/14 0500) Pulse Rate:  [88-93] 88 (08/14 0500) Resp:  [16-24] 21 (08/14 0743) BP: (108-134)/(59-72) 108/59 mmHg (08/14 0500) SpO2:  [96 %-100 %] 98 % (08/14 0743) Weight:  [115.758 kg (255 lb 3.2 oz)] 115.758 kg (255 lb 3.2 oz) (08/14 0500) Last BM Date:  (client unable to remember)  Intake/Output from previous day: 08/13 0701 - 08/14 0700 In: -  Out: 1900 [Urine:1600; Emesis/NG output:300] Intake/Output this shift:    General: No acute distress  Lungs: Clear  Abd: Mildly distended, some bowel sounds.  Wounds are okay.  No bowel movement.  Extremities: No clinical sign or symptoms of DVT  Neuro: Intact  Lab Results:  @LABLAST2 (wbc:2,hgb:2,hct:2,plt:2) BMET ) Recent Labs  11/07/14 0540 11/08/14 0600  NA 135 137  K 4.6 3.8  CL 104 105  CO2 20* 22  GLUCOSE 182* 129*  BUN <5* <5*  CREATININE 0.90 0.94  CALCIUM 8.6* 8.3*   PT/INR No results for input(s): LABPROT, INR in the last 72 hours. ABG No results for input(s): PHART, HCO3 in the last 72 hours.  Invalid input(s): PCO2, PO2  Studies/Results: Dg Abd 1 View  11/07/2014   CLINICAL DATA:  Status post partial colectomy 11/04/2014.  EXAM: ABDOMEN - 1 VIEW  COMPARISON:  11/02/2014  FINDINGS: There is gaseous dilatation of the small bowel. There is gaseous distention of the colon. There are surgical clips along the right-sided the abdomen. There is no evidence of pneumoperitoneum, portal venous gas or pneumatosis. There are no pathologic calcifications along the expected course of the ureters. There is an IVC filter noted with the tip at the level of L1-2. The osseous structures are unremarkable.  IMPRESSION: 1. Gaseous dilatation of small bowel and gaseous distention of the colon most  compatible with an ileus given recent gastrointestinal surgery.   Electronically Signed   By: Kathreen Devoid   On: 11/07/2014 11:00    Anti-infectives: Anti-infectives    Start     Dose/Rate Route Frequency Ordered Stop   11/04/14 1200  cefOXitin (MEFOXIN) 2 g in dextrose 5 % 50 mL IVPB  Status:  Discontinued     2 g 100 mL/hr over 30 Minutes Intravenous 4 times per day 11/04/14 0906 11/04/14 0909   11/04/14 0915  cefOXitin (MEFOXIN) 2 g in dextrose 5 % 50 mL IVPB  Status:  Discontinued     2 g 100 mL/hr over 30 Minutes Intravenous To Surgery 11/04/14 0909 11/04/14 1238      Assessment/Plan: s/p Procedure(s): LAPAROSCOPIC PARTIAL COLECTOMY POSSIBLE OPEN Keep on clear liquids.  Postoperative ileus slow to resolve.  LOS: 14 days   Kathryne Eriksson. Dahlia Bailiff, MD, FACS [phone] [facility] Surgery 11/08/2014

## 2014-11-08 NOTE — Progress Notes (Signed)
[facility] PROGRESS NOTE  Krystal Campbell QMV:[id number] DOB: June 14, 1960 DOA: 10/25/2014 PCP: Josem Kaufmann, MD  Brief narrative 54 year old obese female with history of hypertension, type 2 diabetes mellitus, anemia, who presented to [facility] with shortness of breath and cough after 10 Hour Car drive without any improvement on azithromycin. CT angiogram of the chest was positive for PE. Patient found to be anemic and was recently hospitalized, transfused and sent for outpatient GI evaluation. A colonoscopy done during this hospitalization for anemia showed a mass in the cecum with partially obstructing invasive adenocarcinoma on biopsy. Imaging also showed reactive lymphadenopathy. Patient seen by surgery and underwent laparoscopic partial colectomy. Requiring Dilaudid PCA postop and developed postoperative ileus.    Assessment/Plan: Acute pulmonary embolism  Possibly in the setting of DVT with recent long car drive and underlying malignancy. Doppler Lower extremity shows  thrombosis of right lesser saphenous vein. Echo without RV strain. Given her anticoagulation would be held during surgery pulmonary recommended on having an IVC filter which was placed on 8/9. Bilateral upper extremity venous Doppler was negative for DVT. Patient has been resumed on Lovenox and 8/12.  case manager consulted on insurance coverage. Could possibly discharge her on Lovenox alone. 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 surgical pathology confirms same with lymphovascular invasion. Metastatic LN involvement in 5/16 lymph nodes with clear  - CEA normal. Oncology recommends adjuvant chemotherapy with FOLFOX. Dr Marin Olp plans on [facility]. She will need a port A cath as outpt. -Discontinue Dilaudid PCA and transition to IV Dilaudid every 3 hours.  Postoperative ileus Developed on 8/13. Now passing flatus and has good bowel sounds. Reportedly had one episode of vomiting this morning. Continue clears. Will place on scheduled Reglan. Continue serial abdominal exam. Encouraged ambulation.    History of GERD -Cystoscopy IV PPI twice a day. Status post EGD showing esophageal reflux and hiatal hernia.     History of diabetes mellitus type 2 without any complications - Initiated on SSI,holding metformin   History of essential hypertension stable  Microcytic anemia secondary to iron deficiency. -  transfused prior  to admission . Hemoglobin  stable. Her ferritin is 6. TIBC 528. Vitamin B-12 257. Continue B12 supplement. Received IV iron on 8/12.  Morbid obesity - Body mass index is 42.48 kg/(m^2).   Diet: clear Liquid   DVT prophylaxis:  Therapeutic Subcutaneous Lovenox  Code Status: Full code Family Communication: None at bedside Disposition Plan: Home possibly in 1-2 days once pain better and ileus improves further.   Consultants:  [place] [facility]  Oncology ( Dr Marin Olp)  Pulmonary  Procedures:  2-D echo  Doppler venous upper and lower extremities  IVC filter on 8/9  Laparoscopy with partial colectomy on 8/11  Antibiotics:  None  HPI/Subjective: Seen and examined. Still complaining of epigastric pain. Passing flatus. Had one episode of vomiting early this morning.  Objective: Filed Vitals:   11/08/14 0743  BP:   Pulse:   Temp:   Resp: 21    Intake/Output Summary (Last 24 hours) at 11/08/14 1103 Last data filed at 11/08/14 0100  Gross per 24 hour  Intake      0 ml  Output   1400 ml  Net  -1400 ml   [employer]  11/05/14 0557 11/06/14 0411 11/08/14 0500  Weight: 112.3 kg (247 lb 9.2 oz) 111.8 kg (246 lb 7.6 oz) 115.758 kg (255 lb 3.2 oz)     Exam:   General:  Sleepy, not in dstress  HEENT: , moist oral mucosa, supple neck  Chest: Clear to auscultation bilaterally  CVS: Normal S1 and S2, no murmurs rub or gallop  GI: Laproscopic site clean, bowel sounds improved, nondistended, tender  Musculoskeletal: Warm, no edema  CNS: Alert and oriented    Data Reviewed: Basic Metabolic Panel:  Recent Labs Lab 11/04/14 0450 11/05/14 0404 11/06/14 0454 11/07/14 0540 11/08/14 0600  NA 134* 137 137 135 137  K 3.8 4.1 3.9 4.6 3.8  CL 99* 103 103 104 105  CO2 26 25 26  20* 22  GLUCOSE 192* 180* 147* 182* 129*  BUN 7 <5* <5* <5* <5*  CREATININE 1.02* 0.99 1.00 0.90 0.94  CALCIUM 8.9 8.5* 8.9 8.6* 8.3*   Liver Function Tests:  Recent Labs Lab 11/02/14 0449 11/05/14 0404  AST 30 18  ALT 19 13*  ALKPHOS 73 68  BILITOT 0.4 0.7  PROT 6.3* 6.0*  ALBUMIN 3.0* 2.7*   No results for input(s): LIPASE, AMYLASE in the last 168 hours. No results for input(s): AMMONIA in the last 168 hours. CBC:  Recent Labs Lab 11/04/14 0450 11/05/14 0404 11/06/14 0454 11/07/14 0540 11/08/14 0600  WBC 8.3 8.5 7.7 9.8 6.8  HGB 9.7* 9.3* 8.9* 9.0* 8.5*  HCT 32.0* 31.0* 29.8* 30.1* 28.2*  MCV 80.2 82.4 81.4 80.7 81.5  PLT 307 265 279 295 343   Cardiac Enzymes: No results for input(s): CKTOTAL, CKMB, CKMBINDEX, TROPONINI in the last 168 hours. BNP (last 3 results) No results for input(s): BNP in the last 8760 hours.  ProBNP (last 3 results) No results for input(s): PROBNP in the last 8760 hours.  CBG:  Recent Labs Lab 11/07/14 0734 11/07/14 1151 11/07/14 1700 11/07/14 2052 11/08/14 0722  GLUCAP 210* 124* 100* 124* 114*    Recent Results (from the past 240 hour(s))  Surgical pcr screen     Status: None   Collection Time: 11/04/14  6:09 AM  Result Value Ref Range Status   MRSA, PCR NEGATIVE NEGATIVE Final   Staphylococcus aureus NEGATIVE NEGATIVE Final    Comment:        The Xpert SA Assay (FDA approved for  NASAL specimens in patients over 110 years of age), is one component of a comprehensive surveillance program.  Test performance has been validated by [facility] for patients greater than or equal to 35 year old. It is not intended to diagnose infection nor to guide or monitor treatment.      Studies: Dg Abd 1 View  11/07/2014   CLINICAL DATA:  Status post partial colectomy 11/04/2014.  EXAM: ABDOMEN - 1 VIEW  COMPARISON:  11/02/2014  FINDINGS: There is gaseous dilatation of the small bowel. There is gaseous distention of the colon. There are surgical clips along the right-sided the abdomen. There is no evidence of pneumoperitoneum, portal venous gas or pneumatosis. There are no pathologic calcifications along the expected course of the ureters. There is an IVC filter noted with the tip at the level of L1-2. The osseous structures are unremarkable.  IMPRESSION: 1. Gaseous dilatation of small bowel and gaseous distention of the colon most compatible with an ileus given recent gastrointestinal surgery.   Electronically Signed   By: Kathreen Devoid   On: 11/07/2014 11:00    Scheduled Meds: .  antiseptic oral rinse  7 mL Mouth Rinse BID  . Chlorhexidine Gluconate Cloth  6 each Topical Q0600  . docusate sodium  100 mg Oral BID  . enoxaparin (LOVENOX) injection  1 mg/kg Subcutaneous BID  . feeding supplement  1 Container Oral TID BM  . gabapentin  800 mg Oral TID  . insulin aspart  0-20 Units Subcutaneous TID WC  . insulin aspart  0-5 Units Subcutaneous QHS  . methocarbamol (ROBAXIN)  IV  1,000 mg Intravenous 3 times per day  . metoCLOPramide (REGLAN) injection  5 mg Intravenous 3 times per day  . pantoprazole (PROTONIX) IV  40 mg Intravenous Q12H  . simvastatin  20 mg Oral q1800  . sodium chloride  3 mL Intravenous Q12H  . vitamin B-12  1,000 mcg Oral Daily   Continuous Infusions: . 0.9 % NaCl with KCl 20 mEq / L 100 mL/hr at 11/08/14 0400      Time spent: [place],  [place]  [facility] Pager [phone]. If 7PM-7AM, please contact night-coverage at [email or web address], password [facility] 11/08/2014, 11:03 AM  LOS: 14 days

## 2014-11-08 NOTE — Progress Notes (Signed)
Dilaudid PCA pump d/c. Wasted 58ml of dilaudid into sink with Isabell Jarvis, RN.  Ruben Reason, RN

## 2014-11-08 NOTE — Progress Notes (Signed)
Pt ambulated 245ft. Tolerated ambulating well. VSS. Will continue to monitor.   Ruben Reason, RN

## 2014-11-09 LAB — CBC
HCT: 28.2 % — ABNORMAL LOW (ref 36.0–46.0)
Hemoglobin: 8.5 g/dL — ABNORMAL LOW (ref 12.0–15.0)
MCH: 24.9 pg — ABNORMAL LOW (ref 26.0–34.0)
MCHC: 30.1 g/dL (ref 30.0–36.0)
MCV: 82.5 fL (ref 78.0–100.0)
PLATELETS: 367 10*3/uL (ref 150–400)
RBC: 3.42 MIL/uL — ABNORMAL LOW (ref 3.87–5.11)
WBC: 6.9 10*3/uL (ref 4.0–10.5)

## 2014-11-09 LAB — GLUCOSE, CAPILLARY
GLUCOSE-CAPILLARY: 108 mg/dL — AB (ref 65–99)
GLUCOSE-CAPILLARY: 99 mg/dL (ref 65–99)
GLUCOSE-CAPILLARY: 87 mg/dL (ref 65–99)
Glucose-Capillary: 83 mg/dL (ref 65–99)

## 2014-11-09 MED ORDER — RIVAROXABAN (XARELTO) EDUCATION KIT FOR DVT/PE PATIENTS
PACK | Freq: Once | Status: AC
  Administered 2014-11-09: 16:00:00
  Filled 2014-11-09: qty 1

## 2014-11-09 MED ORDER — GLUCERNA SHAKE PO LIQD: 237.0000 mL | Freq: Two times a day (BID) | ORAL | Status: DC

## 2014-11-09 MED ORDER — HYDROCODONE-ACETAMINOPHEN 5-325 MG PO TABS
1.0000 | ORAL_TABLET | ORAL | Status: DC | PRN
  Administered 2014-11-09 – 2014-11-10 (×3): 2 via ORAL
  Filled 2014-11-09 (×4): qty 2

## 2014-11-09 MED ORDER — ALUM & MAG HYDROXIDE-SIMETH 200-200-20 MG/5ML PO SUSP
30.0000 mL | Freq: Once | ORAL | Status: AC
  Administered 2014-11-09: 30 mL via ORAL
  Filled 2014-11-09: qty 30

## 2014-11-09 MED ORDER — METHOCARBAMOL 500 MG PO TABS
1000.0000 mg | ORAL_TABLET | Freq: Three times a day (TID) | ORAL | Status: DC | PRN
  Administered 2014-11-09: 1000 mg via ORAL
  Filled 2014-11-09: qty 2

## 2014-11-09 MED ORDER — RIVAROXABAN 20 MG PO TABS: 20.0000 mg | ORAL_TABLET | Freq: Every day | ORAL | Status: DC

## 2014-11-09 MED ORDER — RIVAROXABAN 15 MG PO TABS
15.0000 mg | ORAL_TABLET | Freq: Two times a day (BID) | ORAL | Status: DC
  Administered 2014-11-09 – 2014-11-10 (×2): 15 mg via ORAL
  Filled 2014-11-09 (×2): qty 1

## 2014-11-09 MED ORDER — HYDROMORPHONE HCL 1 MG/ML IJ SOLN
2.0000 mg | INTRAMUSCULAR | Status: DC | PRN
Start: 2014-11-09 — End: 2014-11-10
  Administered 2014-11-09: 2 mg via INTRAVENOUS
  Filled 2014-11-09: qty 2

## 2014-11-09 NOTE — Progress Notes (Signed)
[facility] PROGRESS NOTE  Krystal Campbell VOZ:[id number] DOB: Apr 09, 1960 DOA: 10/25/2014 PCP: Josem Kaufmann, MD  Brief narrative 54 year old obese female with history of hypertension, type 2 diabetes mellitus, anemia, who presented to [facility] with shortness of breath and cough after 10 Hour Car drive without any improvement on azithromycin. CT angiogram of the chest was positive for PE. Patient found to be anemic and was recently hospitalized, transfused and sent for outpatient GI evaluation. A colonoscopy done during this hospitalization for anemia showed a mass in the cecum with partially obstructing invasive adenocarcinoma on biopsy. Imaging also showed reactive lymphadenopathy. Patient seen by surgery and underwent laparoscopic partial colectomy. Requiring Dilaudid PCA postop and developed postoperative ileus.    Assessment/Plan: Acute pulmonary embolism  Possibly in the setting of DVT with recent long car drive and underlying malignancy. Doppler Lower extremity shows  thrombosis of right lesser saphenous vein. Echo without RV strain. Given her anticoagulation would be held during surgery pulmonary recommended on having an IVC filter which was placed on 8/9. Bilateral upper extremity venous Doppler was negative for DVT. -Recommend 1 year of anticoagulation per oncology given her underlying malignancy. Currently on  Therapeutic Lovenox.Verified with case manager and patient will have a co-pay of $10 a month for new anticoagulated like Eliquis. Discussed various options for anticoagulation and patient wishes to be on newer anticoagulants. 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 surgical pathology confirms same with lymphovascular invasion. Metastatic LN involvement in 5/16 lymph nodes  with clear  - CEA normal. Oncology recommends adjuvant chemotherapy with FOLFOX. Dr Marin Olp plans on referring her to [facility]. She will need a port A cath as outpt. -Dilaudid PCA. Abdominal pain better and stable on when necessary IV Dilaudid. -Diet advanced to full liquid today.  Postoperative ileus Developed on 8/13. Now passing flatus and has good bowel sounds. Reportedly had one episode of vomiting this morning. Continue clears. Will place on scheduled Reglan. Continue serial abdominal exam. Encouraged ambulation.    History of GERD . Status post EGD showing esophageal reflux and hiatal hernia.  PPI twice a day     History of diabetes mellitus type 2 without any complications - Initiated on SSI,holding metformin   History of essential hypertension stable  Microcytic anemia secondary to iron deficiency. -  transfused prior  to admission . Hemoglobin  stable. Her ferritin is 6. TIBC 528. Vitamin B-12 257. Continue B12 supplement. Received IV iron on 8/12.  Morbid obesity - Body mass index is 42.48 kg/(m^2).   Diet: Full Liquid   DVT prophylaxis:  On anticoagulation  Code Status: Full code Family Communication: son  at bedside Disposition Plan: Home possibly tomorrow if pain controlled and tolerating advanced diet.   Consultants:  [place] [facility]  Oncology ( Dr Marin Olp)  Pulmonary  Procedures:  2-D echo  Doppler venous upper and lower extremities  IVC filter on 8/9  Laparoscopy with partial colectomy on 8/11  Antibiotics:  None  HPI/Subjective: Seen and examined. Abdominal pain better. Passing flatus and able to ambulate in the hallway.  Objective: Filed Vitals:   11/09/14 0533  BP: 161/84  Pulse: 86  Temp: 98.5 F (36.9 C)  Resp: 18    Intake/Output Summary (Last 24 hours) at 11/09/14 1155 Last data filed at 11/09/14 0650  Gross per 24 hour  Intake  1190 ml  Output      0 ml  Net   1190 ml   Filed Weights    11/05/14 0557 11/06/14 0411 11/08/14 0500  Weight: 112.3 kg (247 lb 9.2 oz) 111.8 kg (246 lb 7.6 oz) 115.758 kg (255 lb 3.2 oz)    Exam:   General:  , not in dstress. Appears most comfortable  HEENT: , moist oral mucosa, supple neck  Chest: Clear to auscultation bilaterally  CVS: Normal S1 and S2, no murmurs rub or gallop  GI: Laproscopic site clean, bowel sounds improved, nondistended, epigastric and periumbilical tenderness  Musculoskeletal: Warm, no edema     Data Reviewed: Basic Metabolic Panel:  Recent Labs Lab 11/04/14 0450 11/05/14 0404 11/06/14 0454 11/07/14 0540 11/08/14 0600  NA 134* 137 137 135 137  K 3.8 4.1 3.9 4.6 3.8  CL 99* 103 103 104 105  CO2 26 25 26  20* 22  GLUCOSE 192* 180* 147* 182* 129*  BUN 7 <5* <5* <5* <5*  CREATININE 1.02* 0.99 1.00 0.90 0.94  CALCIUM 8.9 8.5* 8.9 8.6* 8.3*   Liver Function Tests:  Recent Labs Lab 11/05/14 0404  AST 18  ALT 13*  ALKPHOS 68  BILITOT 0.7  PROT 6.0*  ALBUMIN 2.7*   No results for input(s): LIPASE, AMYLASE in the last 168 hours. No results for input(s): AMMONIA in the last 168 hours. CBC:  Recent Labs Lab 11/05/14 0404 11/06/14 0454 11/07/14 0540 11/08/14 0600 11/09/14 0510  WBC 8.5 7.7 9.8 6.8 6.9  HGB 9.3* 8.9* 9.0* 8.5* 8.5*  HCT 31.0* 29.8* 30.1* 28.2* 28.2*  MCV 82.4 81.4 80.7 81.5 82.5  PLT 265 279 295 343 367   Cardiac Enzymes: No results for input(s): CKTOTAL, CKMB, CKMBINDEX, TROPONINI in the last 168 hours. BNP (last 3 results) No results for input(s): BNP in the last 8760 hours.  ProBNP (last 3 results) No results for input(s): PROBNP in the last 8760 hours.  CBG:  Recent Labs Lab 11/08/14 0722 11/08/14 1106 11/08/14 1630 11/08/14 2222 11/09/14 0803  GLUCAP 114* 120* 96 120* 108*    Recent Results (from the past 240 hour(s))  Surgical pcr screen     Status: None   Collection Time: 11/04/14  6:09 AM  Result Value Ref Range Status   MRSA, PCR NEGATIVE  NEGATIVE Final   Staphylococcus aureus NEGATIVE NEGATIVE Final    Comment:        The Xpert SA Assay (FDA approved for NASAL specimens in patients over 24 years of age), is one component of a comprehensive surveillance program.  Test performance has been validated by [facility] for patients greater than or equal to 46 year old. It is not intended to diagnose infection nor to guide or monitor treatment.      Studies: No results found.  Scheduled Meds: . antiseptic oral rinse  7 mL Mouth Rinse BID  . Chlorhexidine Gluconate Cloth  6 each Topical Q0600  . docusate sodium  100 mg Oral BID  . enoxaparin (LOVENOX) injection  1 mg/kg Subcutaneous BID  . feeding supplement (GLUCERNA SHAKE)  237 mL Oral BID BM  . gabapentin  800 mg Oral TID  . insulin aspart  0-20 Units Subcutaneous TID WC  . insulin aspart  0-5 Units Subcutaneous QHS  . metoCLOPramide (REGLAN) injection  5 mg Intravenous 3 times per day  . pantoprazole (PROTONIX) IV  40 mg Intravenous Q12H  . simvastatin  20 mg Oral q1800  . sodium chloride  3 mL Intravenous Q12H  . vitamin B-12  1,000 mcg Oral Daily   Continuous Infusions: . 0.9 % NaCl with KCl 20 mEq / L 100 mL/hr at 11/09/14 1022      Time spent: [place], [place]  [facility] Pager [phone]. If 7PM-7AM, please contact night-coverage at [email or web address], password [facility] 11/09/2014, 11:55 AM  LOS: 15 days

## 2014-11-09 NOTE — Progress Notes (Signed)
Spoke with Dr Marin Olp. He is ok with her being on xarelto.

## 2014-11-09 NOTE — Progress Notes (Signed)
ANTICOAGULATION CONSULT NOTE - Follow Up Consult  Pharmacy Consult for Lovenox to Xarelto Indication: pulmonary embolus  No Known Allergies  Patient Measurements: Height: 5\' 4"  (162.6 cm) Weight: 255 lb 3.2 oz (115.758 kg) IBW/kg (Calculated) : 54.7  Vital Signs: Temp: 98.8 F (37.1 C) (08/15 1300) Temp Source: Oral (08/15 1300) BP: 164/78 mmHg (08/15 1300) Pulse Rate: 91 (08/15 1300)  Labs:  Recent Labs  11/07/14 0540 11/08/14 0600 11/09/14 0510  HGB 9.0* 8.5* 8.5*  HCT 30.1* 28.2* 28.2*  PLT 295 343 367  CREATININE 0.90 0.94  --     Estimated Creatinine Clearance: 85.4 mL/min (by C-G formula based on Cr of 0.94).   Assessment: 54 yo F with new PE. Pt was initiated on heparin in anticipation of surgery.    Underwent colonoscopy on 8/5 that revealed a cecal mass, several biopsies taken. Pt is s/p IVC filter placement 8/9 and surgery 8/10.  Goal of Therapy:  Anti-Xa level 0.6-1 units/ml 4hrs after LMWH dose given Monitor platelets by anticoagulation protocol: Yes   Plan:  DC Lovenox Xarelto 15 mg po BID x 3 weeks then 20 mg po daily  Thank you Anette Guarneri, PharmD [phone] 11/09/2014 3:40 PM

## 2014-11-09 NOTE — Progress Notes (Signed)
Should pt be discharged on Eliquis, copay will be $10 month with Copay savings card from drug company.     Should pt be discharged on Lovenox (Enoxaparin) copay information as follows:  Per rep at [employer] rx:   Enoxaparin:   90 day mail order: $11.25   30 day retail: $11.25   No auth required/ patient can use any retail pharmacy  Reinaldo Raddle, RN, BSN  Trauma/Neuro ICU Case Manager [phone]

## 2014-11-09 NOTE — Progress Notes (Signed)
[facility] Progress Note  5 Days Post-Op  Subjective: Pt c/o less pain.  Slept much better up her on 6N.  No N/V.  Ambulating well in room, not in hall yet.  Having flatus, but no BM yet.  Son at bedside.  Tolerating clears since moved up to 6N.  Wants grits.    Objective: Vital signs in last 24 hours: Temp:  [98.3 F (36.8 C)-99 F (37.2 C)] 98.5 F (36.9 C) (08/15 0533) Pulse Rate:  [85-98] 86 (08/15 0533) Resp:  [16-21] 18 (08/15 0533) BP: (130-161)/(55-84) 161/84 mmHg (08/15 0533) SpO2:  [94 %-98 %] 97 % (08/15 0533) Last BM Date:  (client unable to remember)  Intake/Output from previous day: 08/14 0701 - 08/15 0700 In: 1190 [I.V.:1190] Out: 800 [Urine:800] Intake/Output this shift:    PE: Gen:  Alert, NAD, pleasant Abd: Soft, mild tenderness, mild distension, +BS, no HSM, incisions C/D/I with staples in place, dressings removed, drain with minimal sanguinous drainage, no abdominal scars noted   Lab Results:   Recent Labs  11/29/14 0540 11/08/14 0600  WBC 9.8 6.8  HGB 9.0* 8.5*  HCT 30.1* 28.2*  PLT 295 343   BMET  Recent Labs  Nov 29, 2014 0540 11/08/14 0600  NA 135 137  K 4.6 3.8  CL 104 105  CO2 20* 22  GLUCOSE 182* 129*  BUN <5* <5*  CREATININE 0.90 0.94  CALCIUM 8.6* 8.3*   PT/INR No results for input(s): LABPROT, INR in the last 72 hours. CMP     Component Value Date/Time   NA 137 11/08/2014 0600   K 3.8 11/08/2014 0600   CL 105 11/08/2014 0600   CO2 22 11/08/2014 0600   GLUCOSE 129* 11/08/2014 0600   BUN <5* 11/08/2014 0600   CREATININE 0.94 11/08/2014 0600   CALCIUM 8.3* 11/08/2014 0600   PROT 6.0* 11/05/2014 0404   ALBUMIN 2.7* 11/05/2014 0404   AST 18 11/05/2014 0404   ALT 13* 11/05/2014 0404   ALKPHOS 68 11/05/2014 0404   BILITOT 0.7 11/05/2014 0404   GFRNONAA >60 11/08/2014 0600   GFRAA >60 11/08/2014 0600   Lipase     Component Value Date/Time   LIPASE 36 10/05/2009 1910       Studies/Results: Dg Abd 1  View  11-29-2014   CLINICAL DATA:  Status post partial colectomy 11/04/2014.  EXAM: ABDOMEN - 1 VIEW  COMPARISON:  11/02/2014  FINDINGS: There is gaseous dilatation of the small bowel. There is gaseous distention of the colon. There are surgical clips along the right-sided the abdomen. There is no evidence of pneumoperitoneum, portal venous gas or pneumatosis. There are no pathologic calcifications along the expected course of the ureters. There is an IVC filter noted with the tip at the level of L1-2. The osseous structures are unremarkable.  IMPRESSION: 1. Gaseous dilatation of small bowel and gaseous distention of the colon most compatible with an ileus given recent gastrointestinal surgery.   Electronically Signed   By: Kathreen Devoid   On: 11/29/14 11:00    Anti-infectives: Anti-infectives    Start     Dose/Rate Route Frequency Ordered Stop   11/04/14 1200  cefOXitin (MEFOXIN) 2 g in dextrose 5 % 50 mL IVPB  Status:  Discontinued     2 g 100 mL/hr over 30 Minutes Intravenous 4 times per day 11/04/14 0906 11/04/14 0909   11/04/14 0915  cefOXitin (MEFOXIN) 2 g in dextrose 5 % 50 mL IVPB  Status:  Discontinued  2 g 100 mL/hr over 30 Minutes Intravenous To Surgery 11/04/14 9150 11/04/14 1238       Assessment/Plan Partially obstruction invasive adenocarcinoma of the cecum with 5/16 positive lymph nodes POD #5 s/p lap assisted right colectomy (&appendectomy) Post-operative ileus -Pain control, antiemetics, IVF -Dr. Marin Olp following the patient -NPO, except sips of clears, until bowel function returns -CEA 2.3 post-operatively -PT following  -Robaxin, encouraged oral pain meds, abdominal binder, ice -Ambulate and IS -Remove dressings  Colon, segmental resection for tumor, Right colon, terminal ileum - INVASIVE ADENOCARCINOMA, MODERATELY DIFFERENTIATED, SPANNING 8.2 CM - LYMPHOVASCULAR INVASION IS IDENTIFIED. - PERINEURAL INVASION IS IDENTIFIED. - CARCINOMA INVOLVES PERICOLONIC  SOFT TISSUE. - METASTATIC CARCINOMA IN 5 OF 16 LYMPH NODES (5/16). - THE SURGICAL RESECTION MARGINS  Pulmonary embolism ?hypercoaguable state from cancer vs long car ride -On lovenox, could resume Elliquis once Hgb stabilizes (Hgb 8.5 today) -s/p IVC filter Microcytic anemia secondary to iron deficiency/ABL anemia -s/p 2 units pRBC at AP DM/HLD/HTN/Obesity BMI 42 DVT proph - SCD's, Lovenox    LOS: 15 days    Nat Christen 11/09/2014, 7:30 AM Pager: [phone]

## 2014-11-09 NOTE — Progress Notes (Signed)
Nutrition Follow-up  DOCUMENTATION CODES:   Morbid obesity  INTERVENTION:   -D/c Boost Breeze po TID, each supplement provides 250 kcal and 9 grams of protein -Glucerna Shake po BID, each supplement provides 220 kcal and 10 grams of protein  NUTRITION DIAGNOSIS:   Inadequate oral intake related to poor appetite, altered GI function as evidenced by per patient/family report.  Ongoing  GOAL:   Patient will meet greater than or equal to 90% of their needs  Progressing  MONITOR:   PO intake, Labs, Skin, I & O's  REASON FOR ASSESSMENT:   Malnutrition Screening Tool    ASSESSMENT:   54 year old Caucasian female with a past medical history of obesity, hypertension, diabetes and recently diagnosed anemia for which she was transfused about a week ago. Presented with cough, shortness of breath and was found to have acute pulmonary embolism.  Pt underwent IVC filter placement on 11/03/14.  S/p Procedure(s) on 11/04/14: LAPAROSCOPIC PARTIAL COLECTOMY POSSIBLE OPEN (N/A)  Pt just finished ambulating halls prior to visit.   Pt reports that her appetite is fair. She is starting to get tired of the liquid diet, due to lack options. She reports she is "dying to eat some grits". Meal completion 25-100%. She reveals that she is going to be advanced to a soft diet for lunch.   Pt reveals that she does not like the Boost Breeze supplements, but requesting Glucerna supplement now that diet is advanced. RD to order. Reinforced importance of good PO intake to promote healing.   Diet Order:  Diet - low sodium heart healthy Diet full liquid Room service appropriate?: Yes; Fluid consistency:: Thin  Skin:  Wound (see comment) (closed abdominal incision, abdominal binder)  Last BM:  11/03/14  Height:   Ht Readings from Last 1 Encounters:  10/25/14 5\' 4"  (1.626 m)    Weight:   Wt Readings from Last 1 Encounters:  11/08/14 255 lb 3.2 oz (115.758 kg)    Ideal Body Weight:  54.5 kg  BMI:   Body mass index is 43.78 kg/(m^2).  Estimated Nutritional Needs:   Kcal:  1700-2000  Protein:  100-120 gm  Fluid:  2.5 L  EDUCATION NEEDS:   No education needs identified at this time  Cylie Dor A. Jimmye Norman, RD, LDN, CDE Pager: [phone] After hours Pager: [phone]

## 2014-11-09 NOTE — Progress Notes (Signed)
Physical Therapy Treatment/Discharge Patient Details Name: Krystal Campbell MRN: [id number] DOB: 24-Sep-1960 Today's Date: 11/09/2014    History of Present Illness 54 y.o. female admitted to [facility] on 10/25/14 for SOB, PE, tachycardia, anemia, and cecal wall thickening as seen on CT.  Pt dx with cecal mass (partially obstructing adenocarinoma) s/p lap assisted right colectomy and appendectomy on 11/04/14.  Pt with significant PMHx of DM, HTN, and R TKA.      PT Comments    Krystal Campbell demonstrates ability to safely ambulate in hallway and ascend/descend stairs w/ supervision and does not require any further PT.  She will use cane prn to ambulate at home as she has done PTA and will have assist from her son and grandchildren to ascend/descend stairs for safety.  Her son is planning to build a railing at the stairs within the next week.  Pt is safe to d/c home at this time from a mobility standpoint.  PT signing off.   Follow Up Recommendations  No PT follow up     Equipment Recommendations  None recommended by PT    Recommendations for Other Services       Precautions / Restrictions Precautions Precautions: Fall Precaution Comments: mildly unstable, uses cane prn at baseline Required Braces or Orthoses: Other Brace/Splint Other Brace/Splint: abdominal binder Restrictions Weight Bearing Restrictions: No    Mobility  Bed Mobility Overal bed mobility: Modified Independent             General bed mobility comments: HOB elevated, no physical assist needed.  Pt performs roll w/o cues.  Transfers Overall transfer level: Needs assistance Equipment used: None Transfers: Sit to/from Stand Sit to Stand: Supervision         General transfer comment: supervision for safety, pt does not need an AD for support.     Ambulation/Gait Ambulation/Gait assistance: Supervision Ambulation Distance (Feet): 350 Feet Assistive device: None Gait Pattern/deviations: Step-through pattern     General  Gait Details: very mild instability but pt able to safely ambulate in hallway w/o assist.     Stairs Stairs: Yes Stairs assistance: Supervision Stair Management: No rails;One rail Right;Forwards;Step to pattern Number of Stairs: 4 General stair comments: Pt able to ascend/descend 4 steps w/ supervision, occassionally reaching to railing on Rt for "comfort".  Pt's son to install railing at home within the next week. Pt will have assist from son and grandchildren to enter/exit home upon d/c.  Wheelchair Mobility    Modified Rankin (Stroke Patients Only)       Balance Overall balance assessment: Needs assistance Sitting-balance support: Feet supported;No upper extremity supported Sitting balance-Leahy Scale: Good     Standing balance support: No upper extremity supported;During functional activity Standing balance-Leahy Scale: Good                      Cognition Arousal/Alertness: Awake/alert Behavior During Therapy: WFL for tasks assessed/performed Overall Cognitive Status: Within Functional Limits for tasks assessed                      Exercises      General Comments        Pertinent Vitals/Pain Pain Assessment: No/denies pain Pain Intervention(s): Limited activity within patient's tolerance;Monitored during session;Repositioned    Home Living                      Prior Function            PT Goals (  current goals can now be found in the care plan section) Acute Rehab PT Goals Patient Stated Goal: to go home PT Goal Formulation: All assessment and education complete, DC therapy Progress towards PT goals: Goals met/education completed, patient discharged from PT    Frequency  Other (Comment) (pt d/c from PT this session)    PT Plan Discharge plan needs to be updated    Co-evaluation             End of Session Equipment Utilized During Treatment: Other (comment) (abdominal binder) Activity Tolerance: Patient tolerated  treatment well Patient left: in bed;with call bell/phone within reach;with family/visitor present (sitting EOB)     Time: 0947-1001 PT Time Calculation (min) (ACUTE ONLY): 14 min  Charges:  $Gait Training: 8-22 mins                    G Codes:      Ashley Parr PT, DPT [phone] Pager: [phone] 11/09/2014, 10:17 AM   

## 2014-11-10 DIAGNOSIS — E669 Obesity, unspecified: Secondary | ICD-10-CM | POA: Diagnosis present

## 2014-11-10 DIAGNOSIS — C189 Malignant neoplasm of colon, unspecified: Secondary | ICD-10-CM | POA: Diagnosis present

## 2014-11-10 LAB — GLUCOSE, CAPILLARY
Glucose-Capillary: 101 mg/dL — ABNORMAL HIGH (ref 65–99)
Glucose-Capillary: 126 mg/dL — ABNORMAL HIGH (ref 65–99)

## 2014-11-10 MED ORDER — HYDROCODONE-ACETAMINOPHEN 5-325 MG PO TABS: 2.0000 | ORAL_TABLET | ORAL | Status: DC | PRN

## 2014-11-10 MED ORDER — DOCUSATE SODIUM 100 MG PO CAPS: 100.0000 mg | ORAL_CAPSULE | Freq: Two times a day (BID) | ORAL | Status: DC | PRN

## 2014-11-10 MED ORDER — CYANOCOBALAMIN 1000 MCG PO TABS: 1000.0000 ug | ORAL_TABLET | Freq: Every day | ORAL | Status: DC

## 2014-11-10 MED ORDER — LORAZEPAM 1 MG PO TABS: 0.5000 mg | ORAL_TABLET | Freq: Three times a day (TID) | ORAL | Status: DC | PRN

## 2014-11-10 MED ORDER — RIVAROXABAN (XARELTO) EDUCATION KIT FOR DVT/PE PATIENTS
PACK | Freq: Once | Status: DC
  Filled 2014-11-10: qty 1

## 2014-11-10 MED ORDER — METHOCARBAMOL 500 MG PO TABS: 1000.0000 mg | ORAL_TABLET | Freq: Three times a day (TID) | ORAL | Status: DC | PRN

## 2014-11-10 MED ORDER — RIVAROXABAN (XARELTO) EDUCATION KIT FOR DVT/PE PATIENTS: PACK | Status: DC

## 2014-11-10 MED ORDER — GLUCERNA SHAKE PO LIQD: 237.0000 mL | Freq: Two times a day (BID) | ORAL | Status: DC

## 2014-11-10 NOTE — Progress Notes (Signed)
[facility] Progress Note  6 Days Post-Op  Subjective: Pt doing well.  No N/V, tolerating fulls well, pending soft diet.  Ambulating well.  Using IS.  Anxious to get home.  Has had 2 BM's both loose.  Having flatus.    Objective: Vital signs in last 24 hours: Temp:  [98.5 F (36.9 C)-98.8 F (37.1 C)] 98.5 F (36.9 C) (08/16 0529) Pulse Rate:  [81-91] 81 (08/16 0529) Resp:  [18-19] 18 (08/16 0529) BP: (112-164)/(54-78) 124/54 mmHg (08/16 0529) SpO2:  [96 %-99 %] 98 % (08/16 0529) Last BM Date: 11/09/14  Intake/Output from previous day:   Intake/Output this shift:    PE: Gen:  Alert, NAD, pleasant Abd: Soft, ND, minimal tenderness, +BS, no HSM, incisions C/D/I with staples in place   Lab Results:   Recent Labs  11/08/14 0600 11/09/14 0510  WBC 6.8 6.9  HGB 8.5* 8.5*  HCT 28.2* 28.2*  PLT 343 367   BMET  Recent Labs  11/08/14 0600  NA 137  K 3.8  CL 105  CO2 22  GLUCOSE 129*  BUN <5*  CREATININE 0.94  CALCIUM 8.3*   PT/INR No results for input(s): LABPROT, INR in the last 72 hours. CMP     Component Value Date/Time   NA 137 11/08/2014 0600   K 3.8 11/08/2014 0600   CL 105 11/08/2014 0600   CO2 22 11/08/2014 0600   GLUCOSE 129* 11/08/2014 0600   BUN <5* 11/08/2014 0600   CREATININE 0.94 11/08/2014 0600   CALCIUM 8.3* 11/08/2014 0600   PROT 6.0* 11/05/2014 0404   ALBUMIN 2.7* 11/05/2014 0404   AST 18 11/05/2014 0404   ALT 13* 11/05/2014 0404   ALKPHOS 68 11/05/2014 0404   BILITOT 0.7 11/05/2014 0404   GFRNONAA >60 11/08/2014 0600   GFRAA >60 11/08/2014 0600   Lipase     Component Value Date/Time   LIPASE 36 10/05/2009 1910       Studies/Results: No results found.  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 Marin Olp to follow as OP -Tolerated fulls, advance to soft diet -CEA 2.3 post-operatively -PT said no follow up -Robaxin, encouraged oral pain meds, abdominal binder, ice -Ambulate and IS -Okay to d/c home today.  Staples out on POD #10-12, will arrange office follow up for staple removal and f/u with Dr. Brantley Stage   Colon, segmental resection for tumor, Right colon, terminal ileum - INVASIVE ADENOCARCINOMA, MODERATELY DIFFERENTIATED, SPANNING 8.2 CM - LYMPHOVASCULAR INVASION IS IDENTIFIED. - PERINEURAL INVASION IS IDENTIFIED. - CARCINOMA INVOLVES PERICOLONIC SOFT TISSUE. - METASTATIC CARCINOMA IN 5 OF 16 LYMPH NODES (5/16). - THE SURGICAL RESECTION MARGINS  Pulmonary embolism ?hypercoaguable state from cancer vs long car ride -On lovenox, resume Elliquis  -s/p IVC filter Microcytic anemia secondary to iron deficiency/ABL anemia -s/p  2 units pRBC at [facility] DM/HLD/HTN/Obesity BMI 42 DVT proph - SCD's, Lovenox    LOS: 16 days    Krystal Campbell 11/10/2014, 8:40 AM Pager: [phone]

## 2014-11-10 NOTE — Discharge Summary (Signed)
Physician Discharge Summary  Krystal Campbell BJY:[id number] DOB: 1960-04-06 DOA: 10/25/2014  PCP: Josem Kaufmann, MD  Admit date: 10/25/2014 Discharge date: 11/10/2014  Time spent: 35 minutes  Recommendations for Outpatient Follow-up:  1. Discharge home with outpt follow up with PCP ans [facility] 2. Dr Marin Olp will refer her to oncologist  in [place]. 3. Patient is being discharged on xarelto for acute  PE. Duration of treatment will be for 1 year.  Discharge Diagnoses:  Principal Problem:   Adenocarcinoma of colon   Active Problems:   Microcytic anemia   Diabetes   Hypertension   GERD (gastroesophageal reflux disease)   Cecal lesion   Anxiety state   Acute pulmonary embolism   Iron deficiency anemia   Obesity   Discharge Condition: fair  Diet recommendation: soft diet  Filed Weights   11/05/14 0557 11/06/14 0411 11/08/14 0500  Weight: 112.3 kg (247 lb 9.2 oz) 111.8 kg (246 lb 7.6 oz) 115.758 kg (255 lb 3.2 oz)    History of present illness:  Please refer to admissions H&P for details, in brief, 54 year old obese female with history of hypertension, type 2 diabetes mellitus, anemia, who presented to [facility] with shortness of breath and cough after 10 Hour Car drive without any improvement on azithromycin. CT angiogram of the chest was positive for PE. Patient found to be anemic and was recently hospitalized, transfused and sent for outpatient GI evaluation. A colonoscopy done during this hospitalization for anemia showed a mass in the cecum with partially obstructing invasive adenocarcinoma on biopsy. Imaging also showed reactive lymphadenopathy. Patient seen by surgery and underwent partial colectomy on 11/04/2014. She required Dilaudid PCA postop and developed postoperative ileus. She has clincially improved, tolerating advanced diet and ready for discharge home.   Hospital Course:  Acute pulmonary embolism ( diagnosed at [facility] hospital on  10/25/2014) Possibly in the setting of DVT with recent long car drive and underlying malignancy. Doppler Lower extremity shows thrombosis of right lesser saphenous vein. 2 D Echo without RV strain.  -An IVC filter was placed on 8/9 since her anticoagulation was to be held for surgery. Bilateral upper extremity venous Doppler was negative for DVT. -oncologist recommends 1 year of anticoagulation  given her underlying malignancy.  -after discussion with patient on various anticoagulation options and agreed by oncology, she will be discharged on xarelto.   Invasive adenocarcinoma of the cecum with partial obstruction reactive lymphadenopathy - s/p right partial colectomy (&appendectomy) 8/10 - Biopsy on 8/5 by colonoscopy significant for invasive adenocarcinoma. surgical pathology confirms same with lymphovascular invasion. Metastatic LN involvement in 5/16 lymph nodes with clear margins. - CEA normal. Oncology recommends adjuvant chemotherapy with FOLFOX. Dr Marin Olp plans on referring her to [place] cancer center. She will need a port A cath as outpt. -required PCA post op . abdominal pain improved and tolerating advanced diet. Will discharge her on prn vicodin and robaxin. -[facility] will arrange for outpt follow up in 2 weeks.  Postoperative ileus Developed on 8/13. Maintained on clears for 2 days. Now resolved. Diet advanced to soft.   History of GERD . Status post EGD showing esophageal reflux and hiatal hernia. on PPI twice a day.     History of diabetes mellitus type 2 without any complications Stable. Resume metformin   History of essential hypertension stable  Microcytic anemia secondary to iron deficiency. - transfused prior to admission . Hemoglobin stable. Her ferritin is 6. TIBC 528. Vitamin B-12 257.  B12 supplement added . Received IV  iron on 8/12.  Morbid obesity - Body mass index is 42.48 kg/(m^2). Needs counseling on weight loss and exercise as  outpt.        Code Status: Full code Family Communication: no family at bedside Disposition Plan: Home    Consultants:  [facility]  Oncology ( Dr Marin Olp)  Pulmonary  Procedures:  2-D echo  Doppler venous upper and lower extremities  IVC filter on 8/9   partial colectomy on 8/11  Antibiotics:  None    Discharge Exam: Filed Vitals:   11/10/14 0529  BP: 124/54  Pulse: 81  Temp: 98.5 F (36.9 C)  Resp: 18     General: middle aged obese female not in distress but tearful about  her medical illness  HEENT: pallor+, moist oral mucosa, supple neck  Chest: Clear to auscultation bilaterally  CVS: Normal S1 and S2, no murmurs rub or gallop  GI: surgical site site clean with midline staples, bowel sounds improved, nondistended, mild epigastric and periumbilical tenderness  Musculoskeletal: Warm, no edema  CNS: alert and oriented   Discharge Instructions   Discharge Instructions    Diet - low sodium heart healthy    Complete by:  As directed      Increase activity slowly    Complete by:  As directed           Current Discharge Medication List    START taking these medications   Details  docusate sodium (COLACE) 100 MG capsule Take 1 capsule (100 mg total) by mouth 2 (two) times daily as needed for mild constipation. Qty: 10 capsule, Refills: 0    HYDROcodone-acetaminophen (NORCO/VICODIN) 5-325 MG per tablet Take 2 tablets by mouth every 4 (four) hours as needed for moderate pain or severe pain. Qty: 40 tablet, Refills: 0    methocarbamol (ROBAXIN) 500 MG tablet Take 2 tablets (1,000 mg total) by mouth every 8 (eight) hours as needed for muscle spasms (or pain). Qty: 30 tablet, Refills: 0    rivaroxaban (XARELTO) KIT Take 15 mg twice daily for 21 days ( until 11/30/2014) , then 20 mg once daily ( duration of treatment is for 1 year, until 11/04/2015) Qty: 1 kit, Refills: 0    vitamin B-12 1000 MCG tablet Take 1 tablet (1,000 mcg  total) by mouth daily. Qty: 30 tablet, Refills: 0      CONTINUE these medications which have CHANGED   Details  LORazepam (ATIVAN) 1 MG tablet Take 0.5 tablets (0.5 mg total) by mouth every 8 (eight) hours as needed for anxiety. Qty: 30 tablet, Refills: 0      CONTINUE these medications which have NOT CHANGED   Details  ferrous sulfate 325 (65 FE) MG tablet Take 1 tablet (325 mg total) by mouth 2 (two) times daily with a meal. Qty: 60 tablet, Refills: 3    gabapentin (NEURONTIN) 800 MG tablet Take 800 mg by mouth 3 (three) times daily.    Melatonin 3 MG CAPS Take 6 mg by mouth at bedtime as needed (for sleep).     metFORMIN (GLUCOPHAGE) 1000 MG tablet Take 1,000 mg by mouth 2 (two) times daily.    pantoprazole (PROTONIX) 40 MG tablet Take 1 tablet (40 mg total) by mouth 2 (two) times daily. Qty: 60 tablet, Refills: 1    simvastatin (ZOCOR) 20 MG tablet Take 20 mg by mouth at bedtime.       STOP taking these medications     azithromycin (ZITHROMAX) 250 MG tablet  No Known Allergies Follow-up Information    Follow up with [facility] C, MD. Schedule an appointment as soon as possible for a visit in 3 days.   Specialty:  Family Medicine   Contact information:   [address] [place] [address] [phone]       Follow up with CORNETT,THOMAS A., MD. Schedule an appointment as soon as possible for a visit in 2 weeks.   Specialty:  General Surgery   Why:  For post-operation check.  Call office to confirm date/time.   Contact information:   [address] [place] [address] [phone]       Call [place].   Why:  FOR STAPLE REMOVAL BY    Contact information:   [address] [place] [address] [phone]      Follow up with Josem Kaufmann, MD. Schedule an appointment as soon as possible for a visit in 1 week.   Specialty:  Family Medicine   Contact information:   [address] [place]  [address] [phone]       Follow up with Volanda Napoleon, MD.   Specialty:  Oncology   Why:  patient will be referred to [facility] oncology by Dr Marin Olp. if you do not get contacted from there in 3 weeks , please call Dr Felicita Gage   Contact information:   [place], [address] [place] [address] [phone]        The results of significant diagnostics from this hospitalization (including imaging, microbiology, ancillary and laboratory) are listed below for reference.    Significant Diagnostic Studies: Dg Abd 1 View  11/07/2014   CLINICAL DATA:  Status post partial colectomy 11/04/2014.  EXAM: ABDOMEN - 1 VIEW  COMPARISON:  11/02/2014  FINDINGS: There is gaseous dilatation of the small bowel. There is gaseous distention of the colon. There are surgical clips along the right-sided the abdomen. There is no evidence of pneumoperitoneum, portal venous gas or pneumatosis. There are no pathologic calcifications along the expected course of the ureters. There is an IVC filter noted with the tip at the level of L1-2. The osseous structures are unremarkable.  IMPRESSION: 1. Gaseous dilatation of small bowel and gaseous distention of the colon most compatible with an ileus given recent gastrointestinal surgery.   Electronically Signed   By: Kathreen Devoid   On: 11/07/2014 11:00   Ir Ivc Filter Plmt / S&i /img Guid/mod Sed  11/03/2014   INDICATION: History of pulmonary embolism and cecal mass. Patient with impending colonic surgery and as such, request made for placement of a IVC filter for postoperative caval interruption.  EXAM: ULTRASOUND GUIDANCE FOR VASCULAR ACCESS  IVC CATHETERIZATION AND VENOGRAM  IVC FILTER INSERTION  COMPARISON:  CT the chest, abdomen and pelvis - 10/25/2014  MEDICATIONS: Fentanyl 100 mcg IV; Versed 2 mg IV  ANESTHESIA/SEDATION: Sedation Time  10 minutes  CONTRAST:  63m OMNIPAQUE IOHEXOL 300 MG/ML  SOLN  FLUOROSCOPY TIME:  1 minute, 24 seconds  COMPLICATIONS:  None immediate  PROCEDURE: Informed consent was obtained from the patient following explanation of the procedure, risks, benefits and alternatives. The patient understands, agrees and consents for the procedure. All questions were addressed. A time out was performed prior to the initiation of the procedure.  Maximal barrier sterile technique utilized including caps, mask, sterile gowns, sterile gloves, large sterile drape, hand hygiene, and Betadine prep.  Under sterile condition and local anesthesia, right internal jugular venous access  was performed with ultrasound. An ultrasound image was saved and sent to PACS. Over a guidewire, the IVC filter delivery sheath and inner dilator were advanced into the IVC just above the IVC bifurcation. Contrast injection was performed for an IVC venogram.  Through the delivery sheath, a retrievable Denali IVC filter was deployed below the level of the renal veins and above the IVC bifurcation. Limited post deployment venacavagram was performed.  The delivery sheath was removed and hemostasis was obtained with manual compression. A dressing was placed. The patient tolerated the procedure well without immediate post procedural complication.  FINDINGS: The IVC is patent. No evidence of thrombus, stenosis, or occlusion. No variant venous anatomy. Successful placement of the IVC filter below the level of the renal veins.  IMPRESSION: Successful ultrasound and fluoroscopically guided placement of an infrarenal retrievable IVC filter via right jugular approach.  This IVC filter is potentially retrievable. The patient will be assessed for filter retrieval by Interventional Radiology in approximately 8-12 weeks. Further recommendations regarding filter retrieval, continued surveillance or declaration of device permanence, will be made at that time.   Electronically Signed   By: Sandi Mariscal M.D.   On: 11/03/2014 11:05   Dg Chest Port 1 View  10/25/2014   CLINICAL DATA:  Shortness of  breath, PE  EXAM: PORTABLE CHEST - 1 VIEW  COMPARISON:  None.  FINDINGS: Lungs are clear.  No pleural effusion or pneumothorax.  The heart is normal in size.  IMPRESSION: No evidence of acute cardiopulmonary disease.   Electronically Signed   By: Julian Hy M.D.   On: 10/25/2014 18:20   Dg Abd Portable 1v  11/02/2014   CLINICAL DATA:  54 year old female with acute abdominal pain. Initial encounter.  EXAM: PORTABLE ABDOMEN - 1 VIEW  COMPARISON:  None.  FINDINGS: Gas distended proximal and mid small bowel loops with paucity of distal small bowel gas suggestive of small bowel obstruction.  The possibility of free intraperitoneal air cannot be assessed on a supine view.  Tubal ligation clips are in place.  IMPRESSION: Gas distended proximal and mid small bowel loops with paucity of distal small bowel gas suggestive of small bowel obstruction.  The possibility of free intraperitoneal air cannot be assessed on a supine view.  These results will be called to the ordering clinician or representative by the Radiologist Assistant, and communication documented in the PACS or zVision Dashboard.   Electronically Signed   By: Genia Del M.D.   On: 11/02/2014 18:03    Microbiology: Recent Results (from the past 240 hour(s))  Surgical pcr screen     Status: None   Collection Time: 11/04/14  6:09 AM  Result Value Ref Range Status   MRSA, PCR NEGATIVE NEGATIVE Final   Staphylococcus aureus NEGATIVE NEGATIVE Final    Comment:        The Xpert SA Assay (FDA approved for NASAL specimens in patients over 61 years of age), is one component of a comprehensive surveillance program.  Test performance has been validated by [facility] for patients greater than or equal to 68 year old. It is not intended to diagnose infection nor to guide or monitor treatment.      Labs: Basic Metabolic Panel:  Recent Labs Lab 11/04/14 0450 11/05/14 0404 11/06/14 0454 11/07/14 0540 11/08/14 0600  NA 134* 137 137  135 137  K 3.8 4.1 3.9 4.6 3.8  CL 99* 103 103 104 105  CO2 '26 25 26 ' 20* 22  GLUCOSE 192* 180*  147* 182* 129*  BUN 7 <5* <5* <5* <5*  CREATININE 1.02* 0.99 1.00 0.90 0.94  CALCIUM 8.9 8.5* 8.9 8.6* 8.3*   Liver Function Tests:  Recent Labs Lab 11/05/14 0404  AST 18  ALT 13*  ALKPHOS 68  BILITOT 0.7  PROT 6.0*  ALBUMIN 2.7*   No results for input(s): LIPASE, AMYLASE in the last 168 hours. No results for input(s): AMMONIA in the last 168 hours. CBC:  Recent Labs Lab 11/05/14 0404 11/06/14 0454 11/07/14 0540 11/08/14 0600 11/09/14 0510  WBC 8.5 7.7 9.8 6.8 6.9  HGB 9.3* 8.9* 9.0* 8.5* 8.5*  HCT 31.0* 29.8* 30.1* 28.2* 28.2*  MCV 82.4 81.4 80.7 81.5 82.5  PLT 265 279 295 343 367   Cardiac Enzymes: No results for input(s): CKTOTAL, CKMB, CKMBINDEX, TROPONINI in the last 168 hours. BNP: BNP (last 3 results) No results for input(s): BNP in the last 8760 hours.  ProBNP (last 3 results) No results for input(s): PROBNP in the last 8760 hours.  CBG:  Recent Labs Lab 11/09/14 0803 11/09/14 1205 11/09/14 1655 11/09/14 2145 11/10/14 0756  GLUCAP 108* 83 99 87 101*       Signed:  Clementine Soulliere  [facility] 11/10/2014, 8:49 AM

## 2014-11-10 NOTE — Care Management Note (Signed)
Case Management Note  Patient Details  Name: Cheril Slattery MRN: [id number] Date of Birth: 1960-07-30  Subjective/Objective:    Pt for dc home today with family members.  She is discharging on Xarelto for PE.                  Action/Plan: Pt given free 30 day trial card and copay card for Xarelto.  She will be able to receive Xarelto free for one year with copay card.  She is appreciative of help.    Expected Discharge Date:   11/10/14               Expected Discharge Plan:  Home/Self Care  In-House Referral:     Discharge planning Services  CM Consult, Medication Assistance  Post Acute Care Choice:    Choice offered to:     DME Arranged:    DME Agency:     HH Arranged:    HH Agency:     Status of Service:  Completed, signed off  Medicare Important Message Given:    Date Medicare IM Given:    Medicare IM give by:    Date Additional Medicare IM Given:    Additional Medicare Important Message give by:     If discussed at [place] of Stay Meetings, dates discussed:    Additional Comments:  Reinaldo Raddle, RN, BSN  Trauma/Neuro ICU Case Manager [phone]

## 2014-11-10 NOTE — Discharge Instructions (Addendum)
Rivaroxaban oral tablets °What is this medicine? °RIVAROXABAN (ri va ROX a ban) is an anticoagulant (blood thinner). It is used to treat blood clots in the lungs or in the veins. It is also used after knee or hip surgeries to prevent blood clots. It is also used to lower the chance of stroke in people with a medical condition called atrial fibrillation. °This medicine may be used for other purposes; ask your health care provider or pharmacist if you have questions. °COMMON BRAND NAME(S): Xarelto, Xarelto Starter Pack °What should I tell my health care provider before I take this medicine? °They need to know if you have any of these conditions: °-bleeding disorders °-bleeding in the brain °-blood in your stools (black or tarry stools) or if you have blood in your vomit °-history of stomach bleeding °-kidney disease °-liver disease °-low blood counts, like low white cell, platelet, or red cell counts °-recent or planned spinal or epidural procedure °-take medicines that treat or prevent blood clots °-an unusual or allergic reaction to rivaroxaban, other medicines, foods, dyes, or preservatives °-pregnant or trying to get pregnant °-breast-feeding °How should I use this medicine? °Take this medicine by mouth with a glass of water. Follow the directions on the prescription label. Take your medicine at regular intervals. Do not take it more often than directed. Do not stop taking except on your doctor's advice. Stopping this medicine may increase your risk of a blot clot. Be sure to refill your prescription before you run out of medicine. °If you are taking this medicine after hip or knee replacement surgery, take it with or without food. If you are taking this medicine for atrial fibrillation, take it with your evening meal. If you are taking this medicine to treat blood clots, take it with food at the same time each day. If you are unable to swallow your tablet, you may crush the tablet and mix it in applesauce. Then,  immediately eat the applesauce. You should eat more food right after you eat the applesauce containing the crushed tablet. °Talk to your pediatrician regarding the use of this medicine in children. Special care may be needed. °Overdosage: If you think you have taken too much of this medicine contact a poison control center or emergency room at once. °NOTE: This medicine is only for you. Do not share this medicine with others. °What if I miss a dose? °If you take your medicine once a day and miss a dose, take the missed dose as soon as you remember. If you take your medicine twice a day and miss a dose, take the missed dose immediately. In this instance, 2 tablets may be taken at the same time. The next day you should take 1 tablet twice a day as directed. °What may interact with this medicine? °-aspirin and aspirin-like medicines °-certain antibiotics like erythromycin, azithromycin, and clarithromycin °-certain medicines for fungal infections like ketoconazole and itraconazole °-certain medicines for irregular heart beat like amiodarone, quinidine, dronedarone °-certain medicines for seizures like carbamazepine, phenytoin °-certain medicines that treat or prevent blood clots like warfarin, enoxaparin, and dalteparin °-conivaptan °-diltiazem °-felodipine °-indinavir °-lopinavir; ritonavir °-NSAIDS, medicines for pain and inflammation, like ibuprofen or naproxen °-ranolazine °-rifampin °-ritonavir °-St. John's wort °-verapamil °This list may not describe all possible interactions. Give your health care provider a list of all the medicines, herbs, non-prescription drugs, or dietary supplements you use. Also tell them if you smoke, drink alcohol, or use illegal drugs. Some items may interact with your medicine. °What   should I watch for while using this medicine? Visit your doctor or health care professional for regular checks on your progress. Your condition will be monitored carefully while you are receiving this  medicine. Notify your doctor or health care professional and seek emergency treatment if you develop breathing problems; changes in vision; chest pain; severe, sudden headache; pain, swelling, warmth in the leg; trouble speaking; sudden numbness or weakness of the face, arm, or leg. These can be signs that your condition has gotten worse. If you are going to have surgery, tell your doctor or health care professional that you are taking this medicine. Tell your health care professional that you use this medicine before you have a spinal or epidural procedure. Sometimes people who take this medicine have bleeding problems around the spine when they have a spinal or epidural procedure. This bleeding is very rare. If you have a spinal or epidural procedure while on this medicine, call your health care professional immediately if you have back pain, numbness or tingling (especially in your legs and feet), muscle weakness, paralysis, or loss of bladder or bowel control. Avoid sports and activities that might cause injury while you are using this medicine. Severe falls or injuries can cause unseen bleeding. Be careful when using sharp tools or knives. Consider using an Copy. Take special care brushing or flossing your teeth. Report any injuries, bruising, or red spots on the skin to your doctor or health care professional. What side effects may I notice from receiving this medicine? Side effects that you should report to your doctor or health care professional as soon as possible: -allergic reactions like skin rash, itching or hives, swelling of the face, lips, or tongue -back pain -redness, blistering, peeling or loosening of the skin, including inside the mouth -signs and symptoms of bleeding such as bloody or black, tarry stools; red or dark-brown urine; spitting up blood or brown material that looks like coffee grounds; red spots on the skin; unusual bruising or bleeding from the eye, gums, or  nose Side effects that usually do not require medical attention (Report these to your doctor or health care professional if they continue or are bothersome.): -dizziness -muscle pain This list may not describe all possible side effects. Call your doctor for medical advice about side effects. You may report side effects to FDA at 1-800-FDA-1088. Where should I keep my medicine? Keep out of the reach of children. Store at room temperature between 15 and 30 degrees C (59 and 86 degrees F). Throw away any unused medicine after the expiration date. NOTE: This sheet is a summary. It may not cover all possible information. If you have questions about this medicine, talk to your doctor, pharmacist, or health care provider.  2015, Elsevier/Gold Standard. (2013-07-03 18:47:48)   Soft-Food Meal Plan A soft-food meal plan includes foods that are safe and easy to swallow. This meal plan typically is used:  If you are having trouble chewing or swallowing foods.  As a transition meal plan after only having had liquid meals for a long period. WHAT DO I NEED TO KNOW ABOUT THE SOFT-FOOD MEAL PLAN? A soft-food meal plan includes tender foods that are soft and easy to chew and swallow. In most cases, bite-sized pieces of food are easier to swallow. A bite-sized piece is about  inch or smaller. Foods in this plan do not need to be ground or pureed. Foods that are very hard, crunchy, or sticky should be avoided. Also, breads, cereals, yogurts,  and desserts with nuts, seeds, or fruits should be avoided. WHAT FOODS CAN I EAT? Grains Rice and wild rice. Moist bread, dressing, pasta, and noodles. Well-moistened dry or cooked cereals, such as farina (cooked wheat cereal), oatmeal, or grits. Biscuits, breads, muffins, pancakes, and waffles that have been well moistened. Vegetables Shredded lettuce. Cooked, tender vegetables, including potatoes without skins. Vegetable juices. Broths or creamed soups made with  vegetables that are not stringy or chewy. Strained tomatoes (without seeds). Fruits Canned or well-cooked fruits. Soft (ripe), peeled fresh fruits, such as peaches, nectarines, kiwi, cantaloupe, honeydew melon, and watermelon (without seeds). Soft berries with small seeds, such as strawberries. Fruit juices (without pulp). Meats and Other Protein Sources Moist, tender, lean beef. Mutton. Lamb. Veal. Chicken. [place]. Liver. Ham. Fish without bones. Eggs. Dairy Milk, milk drinks, and cream. Plain cream cheese and cottage cheese. Plain yogurt. Sweets/Desserts Flavored gelatin desserts. Custard. Plain ice cream, frozen yogurt, sherbet, milk shakes, and malts. Plain cakes and cookies. Plain hard candy.  Other Butter, margarine (without trans fat), and cooking oils. Mayonnaise. Cream sauces. Mild spices, salt, and sugar. Syrup, molasses, honey, and jelly. The items listed above may not be a complete list of recommended foods or beverages. Contact your dietitian for more options. WHAT FOODS ARE NOT RECOMMENDED? Grains Dry bread, toast, crackers that have not been moistened. Coarse or dry cereals, such as bran, granola, and shredded wheat. Tough or chewy crusty breads, such as [place] bread or baguettes. Vegetables Corn. Raw vegetables except shredded lettuce. Cooked vegetables that are tough or stringy. Tough, crisp, fried potatoes and potato skins. Fruits Fresh fruits with skins or seeds or both, such as apples, pears, or grapes. Stringy, high-pulp fruits, such as papaya, pineapple, coconut, or mango. Fruit leather, fruit roll-ups, and all dried fruits. Meats and Other Protein Sources Sausages and hot dogs. Meats with gristle. Fish with bones. Nuts, seeds, and chunky peanut or other nut butters. Sweets/Desserts Cakes or cookies that are very dry or chewy.  The items listed above may not be a complete list of foods and beverages to avoid. Contact your dietitian for more information. Document  Released: 06/20/2007 Document Revised: 03/18/2013 Document Reviewed: 02/07/2013 [facility] Patient Information 2015 [place], [place]. This information is not intended to replace advice given to you by your health care provider. Make sure you discuss any questions you have with your health care provider.  Information on my medicine - XARELTO (rivaroxaban)  This medication education was reviewed with me or my healthcare representative as part of my discharge preparation.  The pharmacist that spoke with me during my hospital stay was:  Norva Riffle, [facility]  WHY WAS XARELTO PRESCRIBED FOR YOU? Xarelto was prescribed to treat blood clots that may have been found in the veins of your legs (deep vein thrombosis) or in your lungs (pulmonary embolism) and to reduce the risk of them occurring again.  What do you need to know about Xarelto? The starting dose is one 15 mg tablet taken TWICE daily with food for the FIRST 21 DAYS then on 12/01/14 the dose is changed to one 20 mg tablet taken ONCE A DAY with your evening meal.  DO NOT stop taking Xarelto without talking to the health care provider who prescribed the medication.  Refill your prescription for 20 mg tablets before you run out.  After discharge, you should have regular check-up appointments with your healthcare provider that is prescribing your Xarelto.  In the future your dose may need to be changed if  your kidney function changes by a significant amount.  What do you do if you miss a dose? If you are taking Xarelto TWICE DAILY and you miss a dose, take it as soon as you remember. You may take two 15 mg tablets (total 30 mg) at the same time then resume your regularly scheduled 15 mg twice daily the next day.  If you are taking Xarelto ONCE DAILY and you miss a dose, take it as soon as you remember on the same day then continue your regularly scheduled once daily regimen the next day. Do not take two doses of Xarelto at the same time.    Important Safety Information Xarelto is a blood thinner medicine that can cause bleeding. You should call your healthcare provider right away if you experience any of the following: ? Bleeding from an injury or your nose that does not stop. ? Unusual colored urine (red or dark brown) or unusual colored stools (red or black). ? Unusual bruising for unknown reasons. ? A serious fall or if you hit your head (even if there is no bleeding).  Some medicines may interact with Xarelto and might increase your risk of bleeding while on Xarelto. To help avoid this, consult your healthcare provider or pharmacist prior to using any new prescription or non-prescription medications, including herbals, vitamins, non-steroidal anti-inflammatory drugs (NSAIDs) and supplements.  This website has more information on Xarelto: [email or web address]

## 2014-11-11 ENCOUNTER — Ambulatory Visit: Payer: 59 | Admitting: Gastroenterology

## 2014-11-12 ENCOUNTER — Encounter (HOSPITAL_COMMUNITY): Payer: Self-pay

## 2014-12-04 ENCOUNTER — Ambulatory Visit: Payer: Self-pay | Admitting: Surgery

## 2014-12-04 NOTE — H&P (Signed)
Krystal Campbell 12/04/2014 10:53 AM Location: [facility] Patient #: [id number] DOB: 1960/06/10 Married / Language: Krystal Campbell / Race: White Female History of Present Illness Krystal Campbell A. Becker Christopher MD; 12/04/2014 11:09 AM) Patient words: pt 6 weeks out from right colectomy for stage 3 colon cancer with open wound. This is getting better.   Pain is improving. She will need port for chemotherapy.  The patient is a 54 year old female   Allergies Krystal Campbell, CMA; 12/04/2014 10:53 AM) No Known Drug Allergies 11/17/2014  Medication History Krystal Campbell, CMA; 12/04/2014 10:53 AM) Percocet (7.5-325MG  Tablet, 1 (one) Tablet Oral four times daily, as needed, Taken starting 11/20/2014) Active. Augmentin (875-125MG  Tablet, 1 (one) Tablet Oral two times daily, Taken starting 11/17/2014) Active. Gabapentin (800MG  Tablet, Oral) Active. Diclofenac Sodium (75MG  Tablet DR, Oral) Active. Medications Reconciled    Vitals Krystal Campbell CMA; 12/04/2014 10:54 AM) 12/04/2014 10:53 AM Weight: 245 lb Height: 65in Body Surface Area: 2.26 m Body Mass Index: 40.77 kg/m Temp.: 97.30F(Temporal)  Pulse: 107 (Regular)  BP: 134/74 (Sitting, Left Arm, Standard)     Physical Exam ([name] [name] MD; 12/04/2014 11:10 AM)  General Mental Status-Alert. General Appearance-Consistent with stated age. Hydration-Well hydrated. Voice-Normal.  Eye Eyeball - Bilateral-Extraocular movements intact. Sclera/Conjunctiva - Bilateral-No scleral icterus.  Chest and Lung Exam Chest and lung exam reveals -quiet, even and easy respiratory effort with no use of accessory muscles and on auscultation, normal breath sounds, no adventitious sounds and normal vocal resonance. Inspection Chest Wall - Normal. Back - normal.  Cardiovascular Cardiovascular examination reveals -on palpation PMI is normal in location and amplitude, no palpable S3 or S4. Normal cardiac borders., normal heart sounds,  regular rate and rhythm with no murmurs, carotid auscultation reveals no bruits and normal pedal pulses bilaterally.  Abdomen Note: 3 cm x 4 cm deep open wound clean noo draiange     Assessment & Plan (Anas Reister A. Meagen Limones MD; 12/04/2014 11:08 AM)  POST-OPERATIVE STATE (Z98.89) Impression: stage 3 colon cancer wound infection better getting wound packed by daughter a little soupy at this point seeing oncology today RTC 10- 14 DAYS  POOR VENOUS ACCESS (I87.8) Impression: pt will need port for chemotherapy ned to wait for wound to heal will set up for late september early october risk of bleeding infection collapse lung bleeding around lung organ injury blood vessel injury nerve injury and the need for more surgery.  Current Plans Pt Education - [facility] Portacath HCI Use of a central venous catheter for intravenous therapy was discussed. Technique of catheter placement using ultrasound and fluoroscopy guidance was discussed. Risks such as bleeding, infection, pneumothorax, catheter occlusion, reoperation, and other risks were discussed. I noted a good likelihood this will help address the problem. Questions were answered. The patient expressed understanding & wishes to proceed.

## 2014-12-18 ENCOUNTER — Encounter (HOSPITAL_COMMUNITY)
Admission: RE | Admit: 2014-12-18 | Discharge: 2014-12-18 | Disposition: A | Discharge status: Home / Self Care | Payer: 59 | Source: Ambulatory Visit | Attending: Surgery | Admitting: Surgery

## 2014-12-18 ENCOUNTER — Encounter (HOSPITAL_COMMUNITY): Payer: Self-pay

## 2014-12-18 DIAGNOSIS — F419 Anxiety disorder, unspecified: Secondary | ICD-10-CM | POA: Diagnosis not present

## 2014-12-18 DIAGNOSIS — Z7902 Long term (current) use of antithrombotics/antiplatelets: Secondary | ICD-10-CM | POA: Insufficient documentation

## 2014-12-18 DIAGNOSIS — I10 Essential (primary) hypertension: Secondary | ICD-10-CM | POA: Diagnosis not present

## 2014-12-18 DIAGNOSIS — Z01812 Encounter for preprocedural laboratory examination: Secondary | ICD-10-CM | POA: Diagnosis not present

## 2014-12-18 DIAGNOSIS — Z01818 Encounter for other preprocedural examination: Secondary | ICD-10-CM | POA: Insufficient documentation

## 2014-12-18 DIAGNOSIS — F329 Major depressive disorder, single episode, unspecified: Secondary | ICD-10-CM | POA: Diagnosis not present

## 2014-12-18 DIAGNOSIS — Z86711 Personal history of pulmonary embolism: Secondary | ICD-10-CM | POA: Insufficient documentation

## 2014-12-18 DIAGNOSIS — E119 Type 2 diabetes mellitus without complications: Secondary | ICD-10-CM | POA: Insufficient documentation

## 2014-12-18 DIAGNOSIS — C180 Malignant neoplasm of cecum: Secondary | ICD-10-CM | POA: Insufficient documentation

## 2014-12-18 DIAGNOSIS — Z87891 Personal history of nicotine dependence: Secondary | ICD-10-CM | POA: Diagnosis not present

## 2014-12-18 DIAGNOSIS — E785 Hyperlipidemia, unspecified: Secondary | ICD-10-CM | POA: Insufficient documentation

## 2014-12-18 DIAGNOSIS — Z792 Long term (current) use of antibiotics: Secondary | ICD-10-CM | POA: Diagnosis not present

## 2014-12-18 DIAGNOSIS — Z79899 Other long term (current) drug therapy: Secondary | ICD-10-CM | POA: Insufficient documentation

## 2014-12-18 DIAGNOSIS — K219 Gastro-esophageal reflux disease without esophagitis: Secondary | ICD-10-CM | POA: Diagnosis not present

## 2014-12-18 DIAGNOSIS — M1990 Unspecified osteoarthritis, unspecified site: Secondary | ICD-10-CM | POA: Diagnosis not present

## 2014-12-18 DIAGNOSIS — Z79891 Long term (current) use of opiate analgesic: Secondary | ICD-10-CM | POA: Diagnosis not present

## 2014-12-18 DIAGNOSIS — Z6841 Body Mass Index (BMI) 40.0 and over, adult: Secondary | ICD-10-CM | POA: Diagnosis not present

## 2014-12-18 DIAGNOSIS — Z9049 Acquired absence of other specified parts of digestive tract: Secondary | ICD-10-CM | POA: Diagnosis not present

## 2014-12-18 DIAGNOSIS — C189 Malignant neoplasm of colon, unspecified: Secondary | ICD-10-CM | POA: Diagnosis present

## 2014-12-18 HISTORY — DX: Gastro-esophageal reflux disease without esophagitis: K21.9

## 2014-12-18 HISTORY — DX: Anxiety disorder, unspecified: F41.9

## 2014-12-18 HISTORY — DX: Depression, unspecified: F32.A

## 2014-12-18 HISTORY — DX: Other pulmonary embolism without acute cor pulmonale: I26.99

## 2014-12-18 HISTORY — DX: Anemia, unspecified: D64.9

## 2014-12-18 HISTORY — DX: Major depressive disorder, single episode, unspecified: F32.9

## 2014-12-18 HISTORY — DX: Presence of spectacles and contact lenses: Z97.3

## 2014-12-18 HISTORY — DX: Personal history of other medical treatment: Z92.89

## 2014-12-18 HISTORY — DX: Personal history of other diseases of the digestive system: Z87.19

## 2014-12-18 HISTORY — DX: Pneumonia, unspecified organism: J18.9

## 2014-12-18 HISTORY — DX: Carpal tunnel syndrome, right upper limb: G56.01

## 2014-12-18 HISTORY — DX: Malignant (primary) neoplasm, unspecified: C80.1

## 2014-12-18 HISTORY — DX: Diverticulosis of intestine, part unspecified, without perforation or abscess without bleeding: K57.90

## 2014-12-18 LAB — COMPREHENSIVE METABOLIC PANEL
ALBUMIN: 3.7 g/dL (ref 3.5–5.0)
ALT: 18 U/L (ref 14–54)
AST: 27 U/L (ref 15–41)
Alkaline Phosphatase: 68 U/L (ref 38–126)
Anion gap: 12 (ref 5–15)
BUN: 10 mg/dL (ref 6–20)
CHLORIDE: 104 mmol/L (ref 101–111)
CO2: 22 mmol/L (ref 22–32)
CREATININE: 0.91 mg/dL (ref 0.44–1.00)
Calcium: 9.7 mg/dL (ref 8.9–10.3)
GFR calc Af Amer: >60 mL/min (ref >60)
GFR calc non Af Amer: >60 mL/min (ref >60)
GLUCOSE: 161 mg/dL — AB (ref 65–99)
POTASSIUM: 4 mmol/L (ref 3.5–5.1)
SODIUM: 138 mmol/L (ref 135–145)
Total Bilirubin: 0.5 mg/dL (ref 0.3–1.2)
Total Protein: 7.5 g/dL (ref 6.5–8.1)

## 2014-12-18 LAB — CBC WITH DIFFERENTIAL/PLATELET
BASOS ABS: 0 10*3/uL (ref 0.0–0.1)
BASOS PCT: 1 %
EOS PCT: 3 %
Eosinophils Absolute: 0.2 10*3/uL (ref 0.0–0.7)
HCT: 41.8 % (ref 36.0–46.0)
Hemoglobin: 13.4 g/dL (ref 12.0–15.0)
Lymphocytes Relative: 15 %
Lymphs Abs: 1.2 10*3/uL (ref 0.7–4.0)
MCH: 29 pg (ref 26.0–34.0)
MCHC: 32.1 g/dL (ref 30.0–36.0)
MCV: 90.5 fL (ref 78.0–100.0)
MONO ABS: 0.4 10*3/uL (ref 0.1–1.0)
Monocytes Relative: 5 %
NEUTROS ABS: 6.2 10*3/uL (ref 1.7–7.7)
Neutrophils Relative %: 76 %
PLATELETS: 312 10*3/uL (ref 150–400)
RBC: 4.62 MIL/uL (ref 3.87–5.11)
RDW: 19.3 % — AB (ref 11.5–15.5)
WBC: 8.1 10*3/uL (ref 4.0–10.5)

## 2014-12-18 LAB — GLUCOSE, CAPILLARY: GLUCOSE-CAPILLARY: 170 mg/dL — AB (ref 65–99)

## 2014-12-18 NOTE — Progress Notes (Signed)
Pt denies SOB, chest pain and being under the care of a cardiologist. Pt denies having a stress test and cardiac cath. Spoke with Raquel Sarna, triage nurse, for Dr. Brantley Stage to have MD clarify pre-op instructions regarding Xarelto since pt had a recent PE and an IVC filter placed last month. According to Raquel Sarna, the doctor advised that I " contact the patients primary care physician for Xarelto instructions and if they are unable to give instructions, have the patient stop the Xarelto 72 hours prior to surgery."  Spoke with pt PCP, Dr. Holly Bodily, and she stated that she was unable to provide pre-op Xarelto instructions " due to pt recent history of PE and not knowing the patient."  Pt instructed to stop Xarelto 3 days prior to procedure as advised by Dr. Brantley Stage, pt verbalized understanding of all pre-op instructions.

## 2014-12-21 MED ORDER — CHLORHEXIDINE GLUCONATE 4 % EX LIQD: 1.0000 "application " | CUTANEOUS | Status: DC

## 2014-12-21 MED ORDER — DEXTROSE 5 % IV SOLN
3.0000 g | INTRAVENOUS | Status: AC
  Administered 2014-12-22: 3 g via INTRAVENOUS
  Filled 2014-12-21: qty 3000

## 2014-12-21 NOTE — Progress Notes (Signed)
Anesthesia Chart Review: Patient is a 54 year old old female scheduled for insertion of Port-a-cath tomorrow by Dr. Brantley Stage.  History includes hospital admission 09/2014 with HGB 7.0 s/p transfusion and set up for out-patient GI work-up. Prior to GI work-up being done she had to drive to [place] because her daughter was involved in a MVA. She had worsening SOB despite antibiotics (for presumed URI) following her trip and presented to [facility] on 10/25/14 for further evaluation and was diagnosed with bilateral PE. CT also showed cecal mass. She was transferred to [facility] where she underwent further GI evaluation and ultimately partial colon resection for cecal cancer on 11/04/14. IVC filter was placed 11/03/14 prior to that surgery. She still has the IVC filter and has been on Xarelto since. Other history includes anxiety, depression, GERD, HLD, DM2, HTN. BMI is consistent with morbid obesity. PCP is listed as Dr. Holly Bodily.  Meds includes 65 Fe, Neurontin, Ativan, metformin, Protonix, Xarelto, Zocor. Dr. Brantley Stage instructed patient to hold Xarelto 72 hours prior to surgery.   11/09/14 EKG: NSR.  10/27/14 Echo:  Study Conclusions - Left ventricle: The cavity size was normal. Wall thickness was normal. Systolic function was normal. The estimated ejection fraction was in the range of 60% to 65%. Wall motion was normal; there were no regional wall motion abnormalities. - Left atrium: The atrium was mildly dilated.  10/25/14 CT Chest/abdomen/pelvis [facility]; see PACS images): Impression:  1. Bilateral PE with CT evidence of right heart strain (RV/LV ration = 1.2) consistent with at least submassive (intermediate risk) PE. The presence of right heart strain has been associated with an increased risk of morbidity and mortality. Focal RLL airspace opacity which may represent changes from PE.  2. Cecal wall thickening and enlarged right mesenteric lymph nodes - malignancy and  lymphatic spread is not excluded. Direct inspection of the cecum is recommended.  Preoperative labs noted. Needs PT/INR per anesthesia protocol (not done at PAT because patient still on Xarelto).  Patient has IVC filter due to PE 10/25/14. Has since undergone partial colon resection. She is now scheduled for port-a-cath insertion. Dr. Brantley Stage is aware of history and advised to hold Xarelto 72 hours prior to surgery.  [facility] Short Stay Center/Anesthesiology Phone [phone] 12/21/2014 4:36 PM

## 2014-12-22 ENCOUNTER — Ambulatory Visit (HOSPITAL_COMMUNITY): Payer: 59 | Admitting: Anesthesiology

## 2014-12-22 ENCOUNTER — Ambulatory Visit (HOSPITAL_COMMUNITY): Payer: 59

## 2014-12-22 ENCOUNTER — Encounter (HOSPITAL_COMMUNITY)
Admission: RE | Disposition: A | Discharge status: Home / Self Care | Payer: Self-pay | Source: Ambulatory Visit | Attending: Surgery

## 2014-12-22 ENCOUNTER — Ambulatory Visit (HOSPITAL_COMMUNITY): Payer: 59 | Admitting: Vascular Surgery

## 2014-12-22 ENCOUNTER — Encounter (HOSPITAL_COMMUNITY): Payer: Self-pay | Admitting: Anesthesiology

## 2014-12-22 ENCOUNTER — Ambulatory Visit (HOSPITAL_COMMUNITY)
Admission: RE | Admit: 2014-12-22 | Discharge: 2014-12-22 | Disposition: A | Discharge status: Home / Self Care | Payer: 59 | Source: Ambulatory Visit | Attending: Surgery | Admitting: Surgery

## 2014-12-22 DIAGNOSIS — K219 Gastro-esophageal reflux disease without esophagitis: Secondary | ICD-10-CM | POA: Insufficient documentation

## 2014-12-22 DIAGNOSIS — E119 Type 2 diabetes mellitus without complications: Secondary | ICD-10-CM | POA: Insufficient documentation

## 2014-12-22 DIAGNOSIS — Z9049 Acquired absence of other specified parts of digestive tract: Secondary | ICD-10-CM | POA: Insufficient documentation

## 2014-12-22 DIAGNOSIS — I10 Essential (primary) hypertension: Secondary | ICD-10-CM | POA: Insufficient documentation

## 2014-12-22 DIAGNOSIS — Z6841 Body Mass Index (BMI) 40.0 and over, adult: Secondary | ICD-10-CM | POA: Insufficient documentation

## 2014-12-22 DIAGNOSIS — M1990 Unspecified osteoarthritis, unspecified site: Secondary | ICD-10-CM | POA: Insufficient documentation

## 2014-12-22 DIAGNOSIS — Z789 Other specified health status: Secondary | ICD-10-CM

## 2014-12-22 DIAGNOSIS — Z87891 Personal history of nicotine dependence: Secondary | ICD-10-CM | POA: Insufficient documentation

## 2014-12-22 DIAGNOSIS — Z79899 Other long term (current) drug therapy: Secondary | ICD-10-CM | POA: Insufficient documentation

## 2014-12-22 DIAGNOSIS — Z792 Long term (current) use of antibiotics: Secondary | ICD-10-CM | POA: Insufficient documentation

## 2014-12-22 DIAGNOSIS — Z79891 Long term (current) use of opiate analgesic: Secondary | ICD-10-CM | POA: Insufficient documentation

## 2014-12-22 DIAGNOSIS — C189 Malignant neoplasm of colon, unspecified: Secondary | ICD-10-CM | POA: Insufficient documentation

## 2014-12-22 DIAGNOSIS — Z95828 Presence of other vascular implants and grafts: Secondary | ICD-10-CM

## 2014-12-22 HISTORY — PX: PORTACATH PLACEMENT: SHX2246

## 2014-12-22 LAB — GLUCOSE, CAPILLARY
GLUCOSE-CAPILLARY: 167 mg/dL — AB (ref 65–99)
GLUCOSE-CAPILLARY: 149 mg/dL — AB (ref 65–99)

## 2014-12-22 SURGERY — INSERTION, TUNNELED CENTRAL VENOUS DEVICE, WITH PORT
Anesthesia: General | Site: Chest | Laterality: Right

## 2014-12-22 MED ORDER — PROPOFOL 10 MG/ML IV BOLUS
INTRAVENOUS | Status: AC
  Filled 2014-12-22: qty 20

## 2014-12-22 MED ORDER — FENTANYL CITRATE (PF) 100 MCG/2ML IJ SOLN
25.0000 ug | INTRAMUSCULAR | Status: DC | PRN
  Administered 2014-12-22 (×2): 50 ug via INTRAVENOUS

## 2014-12-22 MED ORDER — OXYCODONE-ACETAMINOPHEN 5-325 MG PO TABS: 1.0000 | ORAL_TABLET | ORAL | Status: DC | PRN

## 2014-12-22 MED ORDER — MIDAZOLAM HCL 2 MG/2ML IJ SOLN
INTRAMUSCULAR | Status: AC
  Filled 2014-12-22: qty 4

## 2014-12-22 MED ORDER — ONDANSETRON HCL 4 MG/2ML IJ SOLN
INTRAMUSCULAR | Status: DC | PRN
  Administered 2014-12-22: 4 mg via INTRAVENOUS

## 2014-12-22 MED ORDER — FENTANYL CITRATE (PF) 100 MCG/2ML IJ SOLN
INTRAMUSCULAR | Status: AC
  Filled 2014-12-22: qty 2

## 2014-12-22 MED ORDER — DEXTROSE 5 % IV SOLN
INTRAVENOUS | Status: DC | PRN
  Administered 2014-12-22: 09:00:00 via INTRAVENOUS

## 2014-12-22 MED ORDER — FENTANYL CITRATE (PF) 100 MCG/2ML IJ SOLN
INTRAMUSCULAR | Status: DC | PRN
  Administered 2014-12-22 (×3): 50 ug via INTRAVENOUS

## 2014-12-22 MED ORDER — HEPARIN SOD (PORK) LOCK FLUSH 100 UNIT/ML IV SOLN
INTRAVENOUS | Status: DC | PRN
  Administered 2014-12-22: 500 [IU] via INTRAVENOUS

## 2014-12-22 MED ORDER — BUPIVACAINE-EPINEPHRINE (PF) 0.25% -1:200000 IJ SOLN
INTRAMUSCULAR | Status: AC
  Filled 2014-12-22: qty 30

## 2014-12-22 MED ORDER — LIDOCAINE HCL (CARDIAC) 20 MG/ML IV SOLN
INTRAVENOUS | Status: DC | PRN
  Administered 2014-12-22: 60 mg via INTRAVENOUS

## 2014-12-22 MED ORDER — MIDAZOLAM HCL 5 MG/5ML IJ SOLN
INTRAMUSCULAR | Status: DC | PRN
  Administered 2014-12-22: 1 mg via INTRAVENOUS

## 2014-12-22 MED ORDER — OXYCODONE HCL 5 MG PO TABS
ORAL_TABLET | ORAL | Status: AC
  Filled 2014-12-22: qty 1

## 2014-12-22 MED ORDER — LACTATED RINGERS IV SOLN
INTRAVENOUS | Status: DC
  Administered 2014-12-22 (×2): via INTRAVENOUS

## 2014-12-22 MED ORDER — SODIUM CHLORIDE 0.9 % IV SOLN
INTRAVENOUS | Status: DC | PRN
  Administered 2014-12-22: 10:00:00

## 2014-12-22 MED ORDER — 0.9 % SODIUM CHLORIDE (POUR BTL) OPTIME
TOPICAL | Status: DC | PRN
  Administered 2014-12-22: 1000 mL

## 2014-12-22 MED ORDER — BUPIVACAINE-EPINEPHRINE 0.25% -1:200000 IJ SOLN
INTRAMUSCULAR | Status: DC | PRN
  Administered 2014-12-22: 30 mL

## 2014-12-22 MED ORDER — HEPARIN SOD (PORK) LOCK FLUSH 100 UNIT/ML IV SOLN
INTRAVENOUS | Status: AC
  Filled 2014-12-22: qty 5

## 2014-12-22 MED ORDER — OXYCODONE HCL 5 MG PO TABS
5.0000 mg | ORAL_TABLET | Freq: Once | ORAL | Status: AC | PRN
  Administered 2014-12-22: 5 mg via ORAL

## 2014-12-22 MED ORDER — FENTANYL CITRATE (PF) 250 MCG/5ML IJ SOLN
INTRAMUSCULAR | Status: AC
  Filled 2014-12-22: qty 5

## 2014-12-22 MED ORDER — OXYCODONE HCL 5 MG/5ML PO SOLN: 5.0000 mg | Freq: Once | ORAL | Status: AC | PRN

## 2014-12-22 MED ORDER — PROPOFOL 10 MG/ML IV BOLUS
INTRAVENOUS | Status: DC | PRN
  Administered 2014-12-22: 180 mg via INTRAVENOUS
  Administered 2014-12-22: 20 mg via INTRAVENOUS

## 2014-12-22 MED ORDER — ONDANSETRON HCL 4 MG/2ML IJ SOLN: 4.0000 mg | Freq: Four times a day (QID) | INTRAMUSCULAR | Status: DC | PRN

## 2014-12-22 SURGICAL SUPPLY — 52 items
BAG DECANTER FOR FLEXI CONT (MISCELLANEOUS) ×3 IMPLANT
BENZOIN TINCTURE PRP APPL 2/3 (GAUZE/BANDAGES/DRESSINGS) ×6 IMPLANT
BLADE SURG 11 STRL SS (BLADE) ×3 IMPLANT
BLADE SURG 15 STRL LF DISP TIS (BLADE) ×1 IMPLANT
BLADE SURG 15 STRL SS (BLADE) ×2
CHLORAPREP W/TINT 26ML (MISCELLANEOUS) ×3 IMPLANT
COVER MAYO STAND STRL (DRAPES) ×3 IMPLANT
COVER SURGICAL LIGHT HANDLE (MISCELLANEOUS) ×3 IMPLANT
COVER TRANSDUCER ULTRASND GEL (DRAPE) ×3 IMPLANT
CRADLE DONUT ADULT HEAD (MISCELLANEOUS) ×3 IMPLANT
DRAPE C-ARM 42X72 X-RAY (DRAPES) ×3 IMPLANT
DRAPE LAPAROSCOPIC ABDOMINAL (DRAPES) ×3 IMPLANT
DRAPE UTILITY XL STRL (DRAPES) ×6 IMPLANT
ELECT CAUTERY BLADE 6.4 (BLADE) ×3 IMPLANT
ELECT REM PT RETURN 9FT ADLT (ELECTROSURGICAL) ×3
ELECTRODE REM PT RTRN 9FT ADLT (ELECTROSURGICAL) ×1 IMPLANT
GAUZE SPONGE 4X4 16PLY XRAY LF (GAUZE/BANDAGES/DRESSINGS) ×3 IMPLANT
GEL ULTRASOUND 20GR AQUASONIC (MISCELLANEOUS) IMPLANT
GLOVE BIO SURGEON ST LM GN SZ9 (GLOVE) ×3 IMPLANT
GLOVE BIO SURGEON STRL SZ8 (GLOVE) ×3 IMPLANT
GLOVE BIOGEL PI IND STRL 7.5 (GLOVE) ×1 IMPLANT
GLOVE BIOGEL PI IND STRL 8 (GLOVE) ×1 IMPLANT
GLOVE BIOGEL PI IND STRL 9 (GLOVE) ×1 IMPLANT
GLOVE BIOGEL PI INDICATOR 7.5 (GLOVE) ×2
GLOVE BIOGEL PI INDICATOR 8 (GLOVE) ×2
GLOVE BIOGEL PI INDICATOR 9 (GLOVE) ×2
GOWN STRL REUS W/ TWL LRG LVL3 (GOWN DISPOSABLE) ×2 IMPLANT
GOWN STRL REUS W/ TWL XL LVL3 (GOWN DISPOSABLE) ×1 IMPLANT
GOWN STRL REUS W/TWL LRG LVL3 (GOWN DISPOSABLE) ×4
GOWN STRL REUS W/TWL XL LVL3 (GOWN DISPOSABLE) ×2
INTRODUCER COOK 11FR (CATHETERS) IMPLANT
KIT BASIN OR (CUSTOM PROCEDURE TRAY) ×3 IMPLANT
KIT PORT POWER 8FR ISP CVUE (Catheter) ×3 IMPLANT
KIT ROOM TURNOVER OR (KITS) ×3 IMPLANT
LIQUID BAND (GAUZE/BANDAGES/DRESSINGS) ×3 IMPLANT
NEEDLE HYPO 25GX1X1/2 BEV (NEEDLE) ×3 IMPLANT
NS IRRIG 1000ML POUR BTL (IV SOLUTION) ×3 IMPLANT
PACK SURGICAL SETUP 50X90 (CUSTOM PROCEDURE TRAY) ×3 IMPLANT
PAD ARMBOARD 7.5X6 YLW CONV (MISCELLANEOUS) ×6 IMPLANT
PENCIL BUTTON HOLSTER BLD 10FT (ELECTRODE) ×3 IMPLANT
SET INTRODUCER 12FR PACEMAKER (SHEATH) IMPLANT
SET SHEATH INTRODUCER 10FR (MISCELLANEOUS) IMPLANT
SHEATH COOK PEEL AWAY SET 9F (SHEATH) IMPLANT
SUT MNCRL AB 4-0 PS2 18 (SUTURE) ×3 IMPLANT
SUT PROLENE 2 0 SH 30 (SUTURE) ×3 IMPLANT
SUT VIC AB 3-0 SH 27 (SUTURE) ×2
SUT VIC AB 3-0 SH 27X BRD (SUTURE) ×1 IMPLANT
SYR 20ML ECCENTRIC (SYRINGE) ×6 IMPLANT
SYR 5ML LUER SLIP (SYRINGE) ×3 IMPLANT
SYR CONTROL 10ML LL (SYRINGE) ×3 IMPLANT
TOWEL OR 17X24 6PK STRL BLUE (TOWEL DISPOSABLE) ×3 IMPLANT
TOWEL OR 17X26 10 PK STRL BLUE (TOWEL DISPOSABLE) ×3 IMPLANT

## 2014-12-22 NOTE — Discharge Instructions (Signed)
    PORT-A-CATH: POST OP INSTRUCTIONS  Always review your discharge instruction sheet given to you by the facility where your surgery was performed.   1. A prescription for pain medication may be given to you upon discharge. Take your pain medication as prescribed, if needed. If narcotic pain medicine is not needed, then you make take acetaminophen (Tylenol) or ibuprofen (Advil) as needed.  2. Take your usually prescribed medications unless otherwise directed. 3. If you need a refill on your pain medication, please contact our office. All narcotic pain medicine now requires a paper prescription.  Phoned in and fax refills are no longer allowed by law.  Prescriptions will not be filled after 5 pm or on weekends.  4. You should follow a light diet for the remainder of the day after your procedure. 5. Most patients will experience some mild swelling and/or bruising in the area of the incision. It may take several days to resolve. 6. It is common to experience some constipation if taking pain medication after surgery. Increasing fluid intake and taking a stool softener (such as Colace) will usually help or prevent this problem from occurring. A mild laxative (Milk of Magnesia or Miralax) should be taken according to package directions if there are no bowel movements after 48 hours.  7. Unless discharge instructions indicate otherwise, you may remove your bandages 48 hours after surgery, and you may shower at that time. You may have steri-strips (small white skin tapes) in place directly over the incision.  These strips should be left on the skin for 7-10 days.  If your surgeon used Dermabond (skin glue) on the incision, you may shower in 24 hours.  The glue will flake off over the next 2-3 weeks.  8. If your port is left accessed at the end of surgery (needle left in port), the dressing cannot get wet and should only by changed by a healthcare professional. When the port is no longer accessed (when the  needle has been removed), follow step 7.   9. ACTIVITIES:  Limit activity involving your arms for the next 72 hours. Do no strenuous exercise or activity for 1 week. You may drive when you are no longer taking prescription pain medication, you can comfortably wear a seatbelt, and you can maneuver your car. 10.You may need to see your doctor in the office for a follow-up appointment.  Please       check with your doctor.  11.When you receive a new Port-a-Cath, you will get a product guide and        ID card.  Please keep them in case you need them.  WHEN TO CALL YOUR DOCTOR ([phone]): 1. Fever over 101.0 2. Chills 3. Continued bleeding from incision 4. Increased redness and tenderness at the site 5. Shortness of breath, difficulty breathing   The clinic staff is available to answer your questions during regular business hours. Please don't hesitate to call and ask to speak to one of the nurses or medical assistants for clinical concerns. If you have a medical emergency, go to the nearest emergency room or call 911.  A surgeon from [facility] is always on call at the hospital.     For further information, please visit [email or web address]      

## 2014-12-22 NOTE — Transfer of Care (Signed)
Immediate Anesthesia Transfer of Care Note  Patient: Krystal Campbell  Procedure(s) Performed: Procedure(s): INSERTION PORT-A-CATH (Right)  Patient Location: PACU  Anesthesia Type:General  Level of Consciousness: awake and patient cooperative  Airway & Oxygen Therapy: Patient Spontanous Breathing and Patient connected to nasal cannula oxygen  Post-op Assessment: Report given to RN, Post -op Vital signs reviewed and stable and Patient moving all extremities  Post vital signs: Reviewed and stable  Last Vitals:  Filed Vitals:   12/22/14 0756  BP: 158/64  Pulse: 99  Temp: 87.5 C    Complications: No apparent anesthesia complications

## 2014-12-22 NOTE — Op Note (Signed)
Preoperative diagnosis: PAC needed for poor venous access   Postoperative diagnosis: Same  Procedure: Portacath Placement with U/S and C ARM right internal jugular  Surgeon: Turner Daniels, MD, FACS  Anesthesia: General and 0.25 % marcaine with epinephrine  Clinical History and Indications: The patient is getting ready to begin chemotherapy for her cancer. She  needs a Port-A-Cath for venous access. Risk of bleeding, infection,  Collapse lung,  Death,  DVT,  Organ injury,  Death,  Mediastinal injury,  Injury to heart,  Injury to blood vessels,  Nerves,  Migration of catheter,  Embolization of catheter and the need for more surgery.  Description of Procedure: I have seen the patient in the holding area and confirmed the plans for the procedure as noted above. I reviewed the risks and complications again and the patient has no further questions. She wishes to proceed.   The patient was then taken to the operating room. After satisfactory general  anesthesia had been obtained the upper chest and lower neck were prepped and draped as a sterile field. The timeout was done.  The right internal jugular vein  was entered under U/S guidance  and the guidewire threaded into the superior vena cava right atrial area under fluoroscopic guidance. An incision was then made on the anterior chest wall and a subcutaneous pocket fashioned for the port reservoir.  The port tubing was then brought through a subcutaneous tunnel from the port site to the guidewire site.  The port and catheter were attached, locked  and flushed. The catheter was measured and cut to appropriate length.The dilator and peel-away sheath were then advanced over the guidewire while monitoring this with fluoroscopy. The guidewire and dilator were removed and the tubing threaded to approximately 22 cm. The peel-away sheath was then removed. The catheter aspirated and flushed easily. Using fluoroscopy the tip was in the superior vena cava right  atrial junction area. It aspirated and flushed easily. That aspirated and flushed easily.  The reservoir was secured to the fascia with 1 sutures of 2-0 Prolene. A final check with fluoroscopy was done to make sure we had no kinks and good positioning of the tip of the catheter. Everything appeared to be okay. The catheter was aspirated, flushed with dilute heparin and then concentrated aqueous heparin.  The incision was then closed with interrupted 3-0 Vicryl, and 4-0 Monocryl subcuticular with Liquid adhesive  on the skin.  There were no operative complications. Estimated blood loss was minimal. All counts were correct. The patient tolerated the procedure well.  Turner Daniels, MD, FACS

## 2014-12-22 NOTE — OR Nursing (Signed)
Oxycodone IR 5 mg po wasted in sink, witnessed by Rex Kras, RN. Tablet dropped in floor. Removed another tablet from pyxis and administered to patient.

## 2014-12-22 NOTE — Anesthesia Procedure Notes (Signed)
Procedure Name: LMA Insertion Date/Time: 12/22/2014 9:20 AM Performed by: Williemae Area B Pre-anesthesia Checklist: Patient identified, Emergency Drugs available, Suction available and Patient being monitored Patient Re-evaluated:Patient Re-evaluated prior to inductionOxygen Delivery Method: Circle system utilized Preoxygenation: Pre-oxygenation with 100% oxygen Intubation Type: IV induction Ventilation: Mask ventilation without difficulty LMA: LMA inserted LMA Size: 4.0 Tube type: Oral Number of attempts: 2 (LMA out then replaced after brief obstruction) Placement Confirmation: breath sounds checked- equal and bilateral and positive ETCO2 Tube secured with: Tape (taped across cheeks) Dental Injury: Teeth and Oropharynx as per pre-operative assessment

## 2014-12-22 NOTE — Anesthesia Preprocedure Evaluation (Addendum)
Anesthesia Evaluation  Patient identified by MRN, date of birth, ID band Patient awake    Reviewed: Allergy & Precautions, NPO status , Patient's Chart, lab work & pertinent test results  Airway Mallampati: II  TM Distance: >3 FB Neck ROM: full    Dental  (+) Teeth Intact, Dental Advisory Given   Pulmonary former smoker,    breath sounds clear to auscultation       Cardiovascular hypertension, Pt. on medications  Rhythm:regular Rate:Normal     Neuro/Psych Anxiety Depression    GI/Hepatic GERD  Medicated and Controlled,  Endo/Other  diabetes, Well Controlled, Type 2Morbid obesity  Renal/GU      Musculoskeletal  (+) Arthritis ,   Abdominal   Peds  Hematology   Anesthesia Other Findings   Reproductive/Obstetrics                           Anesthesia Physical Anesthesia Plan  ASA: II  Anesthesia Plan: General   Post-op Pain Management:    Induction: Intravenous  Airway Management Planned: LMA  Additional Equipment:   Intra-op Plan:   Post-operative Plan:   Informed Consent: I have reviewed the patients History and Physical, chart, labs and discussed the procedure including the risks, benefits and alternatives for the proposed anesthesia with the patient or authorized representative who has indicated his/her understanding and acceptance.     Plan Discussed with: CRNA, Anesthesiologist and Surgeon  Anesthesia Plan Comments:         Anesthesia Quick Evaluation

## 2014-12-22 NOTE — H&P (View-Only) (Signed)
Krystal Campbell 12/04/2014 10:53 AM Location: [facility] Patient #: [id number] DOB: [birth year] Married / Language: [name] / Race: White Female History of Present Illness Krystal Campbell A. Krystal Belloso MD; 12/04/2014 11:09 AM) Patient words: pt 6 weeks out from right colectomy for stage 3 colon cancer with open wound. This is getting better.   Pain is improving. She will need port for chemotherapy.  The patient is a 54 year old female   Allergies Krystal Campbell, CMA; 12/04/2014 10:53 AM) No Known Drug Allergies 11/17/2014  Medication History Krystal Campbell, CMA; 12/04/2014 10:53 AM) Percocet (7.5-325MG  Tablet, 1 (one) Tablet Oral four times daily, as needed, Taken starting 11/20/2014) Active. Augmentin (875-125MG  Tablet, 1 (one) Tablet Oral two times daily, Taken starting 11/17/2014) Active. Gabapentin (800MG  Tablet, Oral) Active. Diclofenac Sodium (75MG  Tablet DR, Oral) Active. Medications Reconciled    Vitals Krystal Campbell CMA; 12/04/2014 10:54 AM) 12/04/2014 10:53 AM Weight: 245 lb Height: 65in Body Surface Area: 2.26 m Body Mass Index: 40.77 kg/m Temp.: 97.46F(Temporal)  Pulse: 107 (Regular)  BP: 134/74 (Sitting, Left Arm, Standard)     Physical Exam (Sky Borboa A. Izan Miron MD; 12/04/2014 11:10 AM)  General Mental Status-Alert. General Appearance-Consistent with stated age. Hydration-Well hydrated. Voice-Normal.  Eye Eyeball - Bilateral-Extraocular movements intact. Sclera/Conjunctiva - Bilateral-No scleral icterus.  Chest and Lung Exam Chest and lung exam reveals -quiet, even and easy respiratory effort with no use of accessory muscles and on auscultation, normal breath sounds, no adventitious sounds and normal vocal resonance. Inspection Chest Wall - Normal. Back - normal.  Cardiovascular Cardiovascular examination reveals -on palpation PMI is normal in location and amplitude, no palpable S3 or S4. Normal cardiac borders., normal heart sounds,  regular rate and rhythm with no murmurs, carotid auscultation reveals no bruits and normal pedal pulses bilaterally.  Abdomen Note: 3 cm x 4 cm deep open wound clean noo draiange     Assessment & Plan (Wilmon Conover A. Johnica Armwood MD; 12/04/2014 11:08 AM)  POST-OPERATIVE STATE (Z98.89) Impression: stage 3 colon cancer wound infection better getting wound packed by daughter a little soupy at this point seeing oncology today RTC 10- 14 DAYS  POOR VENOUS ACCESS (I87.8) Impression: pt will need port for chemotherapy ned to wait for wound to heal will set up for late september early october risk of bleeding infection collapse lung bleeding around lung organ injury blood vessel injury nerve injury and the need for more surgery.  Current Plans Pt Education - CCS Portacath HCI Use of a central venous catheter for intravenous therapy was discussed. Technique of catheter placement using ultrasound and fluoroscopy guidance was discussed. Risks such as bleeding, infection, pneumothorax, catheter occlusion, reoperation, and other risks were discussed. I noted a good likelihood this will help address the problem. Questions were answered. The patient expressed understanding & wishes to proceed.

## 2014-12-22 NOTE — Interval H&P Note (Signed)
History and Physical Interval Note:  12/22/2014 8:36 AM  Krystal Campbell  has presented today for surgery, with the diagnosis of Poor Venous Access  The various methods of treatment have been discussed with the patient and family. After consideration of risks, benefits and other options for treatment, the patient has consented to  Procedure(s): INSERTION PORT-A-CATH (N/A) as a surgical intervention .  The patient's history has been reviewed, patient examined, no change in status, stable for surgery.  I have reviewed the patient's chart and labs.  Questions were answered to the patient's satisfaction.    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

## 2014-12-23 ENCOUNTER — Encounter (HOSPITAL_COMMUNITY): Payer: Self-pay | Admitting: Surgery

## 2015-01-05 NOTE — Anesthesia Postprocedure Evaluation (Signed)
Anesthesia Post Note  Patient: Krystal Campbell  Procedure(s) Performed: Procedure(s) (LRB): INSERTION PORT-A-CATH (Right)  Anesthesia type: General  Patient location: PACU  Post pain: Pain level controlled and Adequate analgesia  Post assessment: Post-op Vital signs reviewed, Patient's Cardiovascular Status Stable, Respiratory Function Stable, Patent Airway and Pain level controlled  Last Vitals:  Filed Vitals:   12/22/14 1211  BP: 143/65  Pulse: 85  Temp:   Resp: 20    Post vital signs: Reviewed and stable  Level of consciousness: awake, alert  and oriented  Complications: No apparent anesthesia complications

## 2015-04-29 ENCOUNTER — Other Ambulatory Visit (HOSPITAL_COMMUNITY): Payer: Self-pay | Admitting: Interventional Radiology

## 2015-04-29 DIAGNOSIS — I82403 Acute embolism and thrombosis of unspecified deep veins of lower extremity, bilateral: Secondary | ICD-10-CM

## 2015-05-12 ENCOUNTER — Ambulatory Visit
Admission: RE | Admit: 2015-05-12 | Discharge: 2015-05-12 | Disposition: A | Discharge status: Home / Self Care | Payer: BLUE CROSS/BLUE SHIELD | Source: Ambulatory Visit | Attending: Interventional Radiology | Admitting: Interventional Radiology

## 2015-05-12 DIAGNOSIS — I82403 Acute embolism and thrombosis of unspecified deep veins of lower extremity, bilateral: Secondary | ICD-10-CM

## 2015-05-12 NOTE — Progress Notes (Signed)
Patient ID: Krystal Campbell, female   DOB: 06/29/1960, 55 y.o.   MRN: [id number]       Chief Complaint: Post IVC filter placement  Referring Physician(s): Cornett  History of Present Illness: Krystal Campbell is a 55 y.o. female with past history significant for colon cancer who was found to have pulmonary embolism on chest CT performed 10/25/2014. IVC filter placement was performed on 11/03/2014 for the purposes of temporary caval interruption prior to the patient ultimately undergoing a right hemicolectomy.  The patient has recovered fully from this surgical procedure though continues on chemotherapy regimen.  Additionally, the patient is maintained on Xarelto for anticoagulations purposes. Patient presents to interventional radiology clinic today for evaluation of potential IVC filter retrieval.  The patient is currently without complaint. She denies shortness of breath. No lower extremity pain or edema. She is tolerating chemotherapy well.  Past Medical History  Diagnosis Date  . Diabetes mellitus without complication   . Hypertension   . Hyperlipidemia   . Arthritis     in knees  . Pulmonary embolism   . Wears glasses   . Pneumonia   . Depression   . Anxiety   . GERD (gastroesophageal reflux disease)   . History of hiatal hernia   . Carpal tunnel syndrome of right wrist   . Cancer     colon  . History of blood transfusion     10/09/14  . Anemia   . Diverticulosis     Past Surgical History  Procedure Laterality Date  . Replacement total knee    . Abdominal hysterectomy    . Kidney surgery      when patient was a teenager  . Joint replacement  4/15    Total right knee replacement; left knee scoped  . Colonoscopy with propofol Left 10/30/2014    Procedure: COLONOSCOPY WITH PROPOFOL;  Surgeon: Ronald Lobo, MD;  Location: [facility] ENDOSCOPY;  Service: Endoscopy;  Laterality: Left;  . Esophagogastroduodenoscopy (egd) with propofol Left 10/30/2014    Procedure: ESOPHAGOGASTRODUODENOSCOPY  (EGD) WITH PROPOFOL;  Surgeon: Ronald Lobo, MD;  Location: [facility] ENDOSCOPY;  Service: Endoscopy;  Laterality: Left;  . Laparoscopic partial colectomy N/A 11/04/2014    Procedure: LAPAROSCOPIC PARTIAL COLECTOMY POSSIBLE OPEN;  Surgeon: Erroll Luna, MD;  Location: [facility];  Service: General;  Laterality: N/A;  . Ivc filter placement ([facility] hx)    . Appendectomy    . Dilation and curettage of uterus    . Portacath placement Right 12/22/2014    Procedure: INSERTION PORT-A-CATH;  Surgeon: Erroll Luna, MD;  Location: [place];  Service: General;  Laterality: Right;    Allergies: Review of patient's allergies indicates no known allergies.  Medications: Prior to Admission medications   Medication Sig Start Date End Date Taking? Authorizing Provider  acetaminophen (TYLENOL) 500 MG tablet Take 1,500 mg by mouth every 6 (six) hours as needed for moderate pain.   Yes Historical Provider, MD  docusate sodium (COLACE) 100 MG capsule Take 1 capsule (100 mg total) by mouth 2 (two) times daily as needed for mild constipation. 11/10/14  Yes Nishant Dhungel, MD  feeding supplement, GLUCERNA SHAKE, (GLUCERNA SHAKE) LIQD Take 237 mLs by mouth 2 (two) times daily between meals. Patient taking differently: Take 237 mLs by mouth as needed (for meal replacement).  11/10/14  Yes Nishant Dhungel, MD  gabapentin (NEURONTIN) 800 MG tablet Take 800 mg by mouth 3 (three) times daily.   Yes Historical Provider, MD  LORazepam (ATIVAN) 1 MG tablet Take  0.5 tablets (0.5 mg total) by mouth every 8 (eight) hours as needed for anxiety. 11/10/14  Yes Nishant Dhungel, MD  MAGNESIUM CARBONATE PO Take 1 tablet by mouth daily.   Yes Historical Provider, MD  Melatonin 3 MG CAPS Take 6 mg by mouth at bedtime as needed (for sleep).    Yes Historical Provider, MD  metFORMIN (GLUCOPHAGE) 1000 MG tablet Take 1,000 mg by mouth 2 (two) times daily.   Yes Historical Provider, MD  pantoprazole (PROTONIX) 40 MG tablet Take 1 tablet (40 mg total) by  mouth 2 (two) times daily. 10/11/14  Yes Kathie Dike, MD  rivaroxaban (XARELTO) 20 MG TABS tablet Take 20 mg by mouth daily with supper.   Yes Historical Provider, MD  simvastatin (ZOCOR) 20 MG tablet Take 20 mg by mouth at bedtime.    Yes Historical Provider, MD  vitamin B-12 1000 MCG tablet Take 1 tablet (1,000 mcg total) by mouth daily. 11/10/14  Yes Nishant Dhungel, MD  ferrous sulfate 325 (65 FE) MG tablet Take 1 tablet (325 mg total) by mouth 2 (two) times daily with a meal. Patient not taking: Reported on 05/12/2015 10/11/14   Kathie Dike, MD  HYDROcodone-acetaminophen (NORCO/VICODIN) 5-325 MG per tablet Take 2 tablets by mouth every 4 (four) hours as needed for moderate pain or severe pain. Patient not taking: Reported on 12/17/2014 11/10/14   Nishant Dhungel, MD  methocarbamol (ROBAXIN) 500 MG tablet Take 2 tablets (1,000 mg total) by mouth every 8 (eight) hours as needed for muscle spasms (or pain). Patient not taking: Reported on 12/17/2014 11/10/14   Nishant Dhungel, MD  oxyCODONE-acetaminophen (ROXICET) 5-325 MG per tablet Take 1-2 tablets by mouth every 4 (four) hours as needed for severe pain. Patient not taking: Reported on 05/12/2015 12/22/14   Erroll Luna, MD  rivaroxaban Alveda Reasons) KIT Take 15 mg twice daily for 21 days ( until 11/30/2014) , then 20 mg once daily ( duration of treatment is for 1 year, until 11/04/2015) Patient not taking: Reported on 12/17/2014 11/10/14   Louellen Molder, MD     Family History  Problem Relation Age of Onset  . Healthy Mother     no clotting issues in either parents  . Healthy Father   . Cancer Other     Social History   Social History  . Marital Status: Married    Spouse Name: N/A  . Number of Children: N/A  . Years of Education: N/A   Social History Main Topics  . Smoking status: Former Smoker    Types: Cigarettes  . Smokeless tobacco: Never Used  . Alcohol Use: Yes     Comment: rarely  . Drug Use: Yes    Special: Marijuana  .  Sexual Activity: Not on file   Other Topics Concern  . Not on file   Social History Narrative    ECOG Status: 1 - Symptomatic but completely ambulatory  Review of Systems: A 12 point ROS discussed and pertinent positives are indicated in the HPI above.  All other systems are negative.  Review of Systems  Respiratory: Negative for chest tightness and shortness of breath.   Cardiovascular: Negative for leg swelling.    Vital Signs: BP 142/76 mmHg  Pulse 88  Temp(Src) 98.2 F (36.8 C) (Oral)  Resp 14  Ht 5' 4" (1.626 m)  Wt 238 lb (107.956 kg)  BMI 40.83 kg/m2  SpO2 99%  Physical Exam  Constitutional: She appears well-developed and well-nourished.    Subcutaneous tubing of right  jugular approach portacatheter    Imaging:  Bilateral lower extremity December ultrasound - earlier same day - no evidence of acute or chronic DVT within either lower extremity.   Labs:  CBC:  Recent Labs  11/07/14 0540 11/08/14 0600 11/09/14 0510 12/18/14 0921  WBC 9.8 6.8 6.9 8.1  HGB 9.0* 8.5* 8.5* 13.4  HCT 30.1* 28.2* 28.2* 41.8  PLT 295 343 367 312    COAGS:  Recent Labs  11/03/14 0533  INR 1.05    BMP:  Recent Labs  11/06/14 0454 11/07/14 0540 11/08/14 0600 12/18/14 0921  NA 137 135 137 138  K 3.9 4.6 3.8 4.0  CL 103 104 105 104  CO2 26 20* 22 22  GLUCOSE 147* 182* 129* 161*  BUN <5* <5* <5* 10  CALCIUM 8.9 8.6* 8.3* 9.7  CREATININE 1.00 0.90 0.94 0.91  GFRNONAA >60 >60 >60 >60  GFRAA >60 >60 >60 >60    LIVER FUNCTION TESTS:  Recent Labs  10/29/14 0310 11/02/14 0449 11/05/14 0404 12/18/14 0921  BILITOT 0.4 0.4 0.7 0.5  AST _0 ALT 16 19 13* 18  ALKPHOS 71 73 68 68  PROT 6.0* 6.3* 6.0* 7.5  ALBUMIN 2.8* 3.0* 2.7* 3.7    TUMOR MARKERS:  Recent Labs  11/05/14 1317  CEA 2.3    Assessment and Plan:  Krystal Campbell is a 55 y.o. female with past history significant for colon cancer who was found to have pulmonary embolism on  chest CT performed 10/25/2014.  IVC filter placement was performed on 11/03/2014 for the purposes of temporary caval interruption prior to the patient ultimately undergoing a right hemicolectomy.    The patient has recovered fully from her surgical intervention and is maintained on Xarelto for anti-coagulation purposes. Bilateral lower extremities Doppler ultrasound performed today in the interventional radiology clinic was negative for acute or chronic DVT within either lower extremity.   As such, we will arrange for the patient undergo IVC filter retrieval.  Per patient request, this procedure will be scheduled at [facility] on March 2.  The patient was instructed to hold her Xarelto one day prior to the procedure.  We defer to referring oncologist, Dr. Vaughan Basta, in regards to the long-term plan regarding the appropriateness of continued anticoagulation.  A copy of this report was sent to the requesting provider on this date.  Electronically Signed: Sandi Mariscal 05/12/2015, 4:17 PM   I spent a total of 15 Minutes in face to face in clinical consultation, greater than 50% of which was counseling/coordinating care for IVC filter retrieval

## 2015-06-01 ENCOUNTER — Telehealth (HOSPITAL_COMMUNITY): Payer: Self-pay

## 2015-06-01 ENCOUNTER — Other Ambulatory Visit (HOSPITAL_COMMUNITY): Payer: Self-pay | Admitting: Interventional Radiology

## 2015-06-01 DIAGNOSIS — I82403 Acute embolism and thrombosis of unspecified deep veins of lower extremity, bilateral: Secondary | ICD-10-CM

## 2015-06-01 NOTE — Telephone Encounter (Signed)
Called to schedule IVC filter retrieval, left VM for pt to return call. AW

## 2015-06-07 ENCOUNTER — Telehealth (HOSPITAL_COMMUNITY): Payer: Self-pay | Admitting: Interventional Radiology

## 2015-06-07 NOTE — Telephone Encounter (Signed)
Called pt, left VM for her to call back to scheduled her IVC filter removal procedure. JM

## 2015-08-03 ENCOUNTER — Other Ambulatory Visit: Payer: Self-pay | Admitting: Radiology

## 2015-08-04 ENCOUNTER — Other Ambulatory Visit: Payer: Self-pay | Admitting: General Surgery

## 2015-08-05 ENCOUNTER — Ambulatory Visit (HOSPITAL_COMMUNITY)
Admission: RE | Admit: 2015-08-05 | Discharge: 2015-08-05 | Disposition: A | Discharge status: Home / Self Care | Payer: BLUE CROSS/BLUE SHIELD | Source: Ambulatory Visit | Attending: Interventional Radiology | Admitting: Interventional Radiology

## 2015-08-05 ENCOUNTER — Encounter (HOSPITAL_COMMUNITY): Payer: Self-pay

## 2015-08-05 DIAGNOSIS — Z7984 Long term (current) use of oral hypoglycemic drugs: Secondary | ICD-10-CM | POA: Insufficient documentation

## 2015-08-05 DIAGNOSIS — E785 Hyperlipidemia, unspecified: Secondary | ICD-10-CM | POA: Diagnosis not present

## 2015-08-05 DIAGNOSIS — Z86711 Personal history of pulmonary embolism: Secondary | ICD-10-CM | POA: Insufficient documentation

## 2015-08-05 DIAGNOSIS — Z4689 Encounter for fitting and adjustment of other specified devices: Secondary | ICD-10-CM | POA: Diagnosis present

## 2015-08-05 DIAGNOSIS — I82403 Acute embolism and thrombosis of unspecified deep veins of lower extremity, bilateral: Secondary | ICD-10-CM

## 2015-08-05 DIAGNOSIS — I10 Essential (primary) hypertension: Secondary | ICD-10-CM | POA: Insufficient documentation

## 2015-08-05 DIAGNOSIS — Z87891 Personal history of nicotine dependence: Secondary | ICD-10-CM | POA: Diagnosis not present

## 2015-08-05 DIAGNOSIS — Z7901 Long term (current) use of anticoagulants: Secondary | ICD-10-CM | POA: Insufficient documentation

## 2015-08-05 DIAGNOSIS — K219 Gastro-esophageal reflux disease without esophagitis: Secondary | ICD-10-CM | POA: Diagnosis not present

## 2015-08-05 DIAGNOSIS — D649 Anemia, unspecified: Secondary | ICD-10-CM | POA: Diagnosis not present

## 2015-08-05 DIAGNOSIS — F419 Anxiety disorder, unspecified: Secondary | ICD-10-CM | POA: Diagnosis not present

## 2015-08-05 DIAGNOSIS — F329 Major depressive disorder, single episode, unspecified: Secondary | ICD-10-CM | POA: Diagnosis not present

## 2015-08-05 DIAGNOSIS — E119 Type 2 diabetes mellitus without complications: Secondary | ICD-10-CM | POA: Insufficient documentation

## 2015-08-05 DIAGNOSIS — Z85038 Personal history of other malignant neoplasm of large intestine: Secondary | ICD-10-CM | POA: Diagnosis not present

## 2015-08-05 DIAGNOSIS — F1290 Cannabis use, unspecified, uncomplicated: Secondary | ICD-10-CM | POA: Diagnosis not present

## 2015-08-05 DIAGNOSIS — M1990 Unspecified osteoarthritis, unspecified site: Secondary | ICD-10-CM | POA: Insufficient documentation

## 2015-08-05 LAB — BASIC METABOLIC PANEL
Anion gap: 14 (ref 5–15)
BUN: 11 mg/dL (ref 6–20)
CALCIUM: 9.7 mg/dL (ref 8.9–10.3)
CHLORIDE: 104 mmol/L (ref 101–111)
CO2: 22 mmol/L (ref 22–32)
CREATININE: 0.89 mg/dL (ref 0.44–1.00)
GFR calc Af Amer: >60 mL/min (ref >60)
GFR calc non Af Amer: >60 mL/min (ref >60)
Glucose, Bld: 167 mg/dL — ABNORMAL HIGH (ref 65–99)
Potassium: 4.2 mmol/L (ref 3.5–5.1)
SODIUM: 140 mmol/L (ref 135–145)

## 2015-08-05 LAB — CBC
HCT: 39.3 % (ref 36.0–46.0)
Hemoglobin: 12.7 g/dL (ref 12.0–15.0)
MCH: 32.3 pg (ref 26.0–34.0)
MCHC: 32.3 g/dL (ref 30.0–36.0)
MCV: 100 fL (ref 78.0–100.0)
Platelets: 257 10*3/uL (ref 150–400)
RBC: 3.93 MIL/uL (ref 3.87–5.11)
RDW: 13.7 % (ref 11.5–15.5)
WBC: 6.3 10*3/uL (ref 4.0–10.5)

## 2015-08-05 LAB — PROTIME-INR
INR: 1.05 (ref 0.00–1.49)
Prothrombin Time: 13.9 seconds (ref 11.6–15.2)

## 2015-08-05 LAB — GLUCOSE, CAPILLARY: Glucose-Capillary: 141 mg/dL — ABNORMAL HIGH (ref 65–99)

## 2015-08-05 MED ORDER — FENTANYL CITRATE (PF) 100 MCG/2ML IJ SOLN
INTRAMUSCULAR | Status: AC
  Filled 2015-08-05: qty 2

## 2015-08-05 MED ORDER — FENTANYL CITRATE (PF) 100 MCG/2ML IJ SOLN
INTRAMUSCULAR | Status: AC | PRN
  Administered 2015-08-05 (×2): 50 ug via INTRAVENOUS

## 2015-08-05 MED ORDER — IOPAMIDOL (ISOVUE-300) INJECTION 61%
INTRAVENOUS | Status: AC
  Administered 2015-08-05: 30 mL
  Filled 2015-08-05: qty 100

## 2015-08-05 MED ORDER — SODIUM CHLORIDE 0.9 % IV SOLN: Freq: Once | INTRAVENOUS | Status: DC

## 2015-08-05 MED ORDER — MIDAZOLAM HCL 2 MG/2ML IJ SOLN
INTRAMUSCULAR | Status: AC
  Filled 2015-08-05: qty 2

## 2015-08-05 MED ORDER — MIDAZOLAM HCL 2 MG/2ML IJ SOLN
INTRAMUSCULAR | Status: AC | PRN
  Administered 2015-08-05: 2 mg via INTRAVENOUS

## 2015-08-05 MED ORDER — LIDOCAINE HCL 1 % IJ SOLN
INTRAMUSCULAR | Status: AC
  Administered 2015-08-05: 8 mL
  Filled 2015-08-05: qty 20

## 2015-08-05 NOTE — Sedation Documentation (Signed)
Patient is resting comfortably. 

## 2015-08-05 NOTE — H&P (Signed)
Chief Complaint: Patient was seen in consultation today for Inferior vena cava filter removal at the request of Dr Audree Camel  Referring Physician(s): Dr Audree Camel  Supervising Physician: Sandi Mariscal  Patient Status:  Out-pt  History of Present Illness: Krystal Campbell is a 55 y.o. female   Hx Colon Ca Dx PE 10/25/2014 Retrievable IVC filter placed 11/03/14 --- need for hemicolectomy and off anticoagulation Maintained now on Xarelto- (LD 5/9 pm) Bilateral LE doppler 05/12/15: IMPRESSION: No evidence of acute or chronic DVT within either lower extremity.  Was consulted with Dr Pascal Lux regarding retrieval 05/12/2015 Originally scheduled for removal of IVC filter 05/27/2015; but was still actively in chemotherapy. Dr Jacquiline Doe wanted pt to wait til 1 mo post chemo to remove filter Last chemo 06/21/2015 Now scheduled for filter removal   Past Medical History  Diagnosis Date  . Diabetes mellitus without complication ([place])   . Hypertension   . Hyperlipidemia   . Arthritis     in knees  . Pulmonary embolism ([place])   . Wears glasses   . Pneumonia   . Depression   . Anxiety   . GERD (gastroesophageal reflux disease)   . History of hiatal hernia   . Carpal tunnel syndrome of right wrist   . Cancer (HCC)     colon  . History of blood transfusion     10/09/14  . Anemia   . Diverticulosis     Past Surgical History  Procedure Laterality Date  . Replacement total knee    . Abdominal hysterectomy    . Kidney surgery      when patient was a teenager  . Joint replacement  4/15    Total right knee replacement; left knee scoped  . Colonoscopy with propofol Left 10/30/2014    Procedure: COLONOSCOPY WITH PROPOFOL;  Surgeon: Ronald Lobo, MD;  Location: [facility] END[facility]COPY;  Service: Endoscopy;  Laterality: Left;  . Esophagogastroduodenoscopy (egd) with propofol Left 10/30/2014    Procedure: ESOPHAGOGASTRODUODENOSCOPY (EGD) WITH PROPOFOL;  Surgeon: Ronald Lobo, MD;  Location: [facility]  ENDOSCOPY;  Service: Endoscopy;  Laterality: Left;  . Laparoscopic partial colectomy N/A 11/04/2014    Procedure: LAPAROSCOPIC PARTIAL COLECTOMY POSSIBLE OPEN;  Surgeon: Erroll Luna, MD;  Location: [place];  Service: General;  Laterality: N/A;  . Ivc filter placement ([facility] hx)    . Appendectomy    . Dilation and curettage of uterus    . Portacath placement Right 12/22/2014    Procedure: INSERTION PORT-A-CATH;  Surgeon: Erroll Luna, MD;  Location: [place];  Service: General;  Laterality: Right;    Allergies: Review of patient's allergies indicates no known allergies.  Medications: Prior to Admission medications   Medication Sig Start Date End Date Taking? Authorizing Provider  feeding supplement, GLUCERNA SHAKE, (GLUCERNA SHAKE) LIQD Take 237 mLs by mouth 2 (two) times daily between meals. Patient taking differently: Take 237 mLs by mouth as needed (for meal replacement).  11/10/14  Yes Nishant Dhungel, MD  gabapentin (NEURONTIN) 800 MG tablet Take 800 mg by mouth 3 (three) times daily.   Yes Historical Provider, MD  LORazepam (ATIVAN) 1 MG tablet Take 0.5 tablets (0.5 mg total) by mouth every 8 (eight) hours as needed for anxiety. 11/10/14  Yes Nishant Dhungel, MD  Melatonin 10 MG CAPS Take 10 mg by mouth at bedtime.   Yes Historical Provider, MD  metFORMIN (GLUCOPHAGE) 1000 MG tablet Take 1,000 mg by mouth 2 (two) times daily.   Yes Historical Provider, MD  pantoprazole (  PROTONIX) 40 MG tablet Take 1 tablet (40 mg total) by mouth 2 (two) times daily. 10/11/14  Yes Kathie Dike, MD  rivaroxaban (XARELTO) 20 MG TABS tablet Take 20 mg by mouth daily with supper.   Yes Historical Provider, MD  simvastatin (ZOCOR) 20 MG tablet Take 20 mg by mouth at bedtime.    Yes Historical Provider, MD  vitamin B-12 1000 MCG tablet Take 1 tablet (1,000 mcg total) by mouth daily. 11/10/14  Yes Nishant Dhungel, MD  acetaminophen (TYLENOL) 500 MG tablet Take 1,500 mg by mouth every 6 (six) hours as needed for  moderate pain.    Historical Provider, MD  docusate sodium (COLACE) 100 MG capsule Take 1 capsule (100 mg total) by mouth 2 (two) times daily as needed for mild constipation. 11/10/14   Nishant Dhungel, MD     Family History  Problem Relation Age of Onset  . Healthy Mother     no clotting issues in either parents  . Healthy Father   . Cancer Other     Social History   Social History  . Marital Status: Married    Spouse Name: N/A  . Number of Children: N/A  . Years of Education: N/A   Social History Main Topics  . Smoking status: Former Smoker    Types: Cigarettes  . Smokeless tobacco: Never Used  . Alcohol Use: Yes     Comment: rarely  . Drug Use: Yes    Special: Marijuana  . Sexual Activity: Not Asked   Other Topics Concern  . None   Social History Narrative      Review of Systems: A 12 point ROS discussed and pertinent positives are indicated in the HPI above.  All other systems are negative.  Review of Systems  Constitutional: Negative for fever, activity change, appetite change and fatigue.  Respiratory: Negative for shortness of breath.   Gastrointestinal: Negative for abdominal pain.  Musculoskeletal: Negative for gait problem.  Neurological: Negative for weakness.  Psychiatric/Behavioral: Negative for behavioral problems and confusion.    Vital Signs: BP 154/91 mmHg  Pulse 83  Temp(Src) 98.5 F (36.9 C)  Resp 18  Ht 5\' 4"  (1.626 m)  Wt 237 lb (107.502 kg)  BMI 40.66 kg/m2  SpO2 100%  Physical Exam  Constitutional: She is oriented to person, place, and time.  Cardiovascular: Normal rate, regular rhythm and normal heart sounds.   No murmur heard. Pulmonary/Chest: Effort normal and breath sounds normal. She has no wheezes.  Abdominal: Soft. Bowel sounds are normal. There is no tenderness.  Musculoskeletal: Normal range of motion.  Neurological: She is alert and oriented to person, place, and time.  Skin: Skin is warm and dry.  PAC Rt IJ    Psychiatric: She has a normal mood and affect. Her behavior is normal. Judgment and thought content normal.  Nursing note and vitals reviewed.   Mallampati Score:  MD Evaluation Airway: WNL Heart: WNL Abdomen: WNL Chest/ Lungs: WNL ASA  Classification: 3 Mallampati/Airway Score: One  Imaging: No results found.  Labs:  CBC:  Recent Labs  11/08/14 0600 11/09/14 0510 12/18/14 0921 08/05/15 0930  WBC 6.8 6.9 8.1 6.3  HGB 8.5* 8.5* 13.4 12.7  HCT 28.2* 28.2* 41.8 39.3  PLT 343 367 312 257    COAGS:  Recent Labs  11/03/14 0533 08/05/15 0930  INR 1.05 1.05    BMP:  Recent Labs  11/06/14 0454 11/07/14 0540 11/08/14 0600 12/18/14 0921  NA 137 135 137 138  K  3.9 4.6 3.8 4.0  CL 103 104 105 104  CO2 26 20* 22 22  GLUCOSE 147* 182* 129* 161*  BUN <5* <5* <5* 10  CALCIUM 8.9 8.6* 8.3* 9.7  CREATININE 1.00 0.90 0.94 0.91  GFRNONAA >60 >60 >60 >60  GFRAA >60 >60 >60 >60    LIVER FUNCTION TESTS:  Recent Labs  10/29/14 0310 11/02/14 0449 11/05/14 0404 12/18/14 0921  BILITOT 0.4 0.4 0.7 0.5  AST 23 30 18 27   ALT 16 19 13* 18  ALKPHOS 71 73 68 68  PROT 6.0* 6.3* 6.0* 7.5  ALBUMIN 2.8* 3.0* 2.7* 3.7    TUMOR MARKERS:  Recent Labs  11/05/14 1317  CEA 2.3    Assessment and Plan:  Hx Colon Ca Hx +PE 09/2015 Inferior vena cava filter placed 10/2014 after need for hemicolectomy surgery Request now for retrieval per Dr Jacquiline Doe On Xarelto- LD 5/9 pm Last chemo 06/21/15 B LE doppler 2/17- neg Pt aware of procedure benefits and risks including but not limited to Infection; bleeding; vessel damage Agreeable to proceed Consent signed andin chart  Thank you for this interesting consult.  I greatly enjoyed meeting Krystal Campbell and look forward to participating in their care.  A copy of this report was sent to the requesting provider on this date.  Electronically Signed: Hetty Linhart,Kaylani A 08/05/2015, 9:51 AM   I spent a total of  30 Minutes   in  face to face in clinical consultation, greater than 50% of which was counseling/coordinating care for IVC filter removal

## 2015-08-05 NOTE — Discharge Instructions (Signed)
Wound Care Taking care of your wound properly can help to prevent pain and infection. It can also help your wound to heal more quickly.   Pat the wound dry with a clean towel. Do not rub it.  There are many different ways to close and cover a wound. For example, a wound can be covered with stitches (sutures), skin glue, or adhesive strips. Follow instructions from your health care provider about:  How to take care of your wound.  When and how you should change your bandage (dressing).  When you should remove your dressing.  Removing whatever was used to close your wound.  Check your wound every day for signs of infection. Watch for:  Redness, swelling, or pain.  Fluid, blood, or pus.  Keep the dressing dry until your health care provider says it can be removed. Do not take baths, swim, use a hot tub, or do anything that would put your wound underwater until your health care provider approves.  Raise (elevate) the injured area above the level of your heart while you are sitting or lying down.  Do not scratch or pick at the wound.  Keep all follow-up visits as told by your health care provider. This is important. SEEK MEDICAL CARE IF:  You received a tetanus shot and you have swelling, severe pain, redness, or bleeding at the injection site.  You have a fever.  Your pain is not controlled with medicine.  You have increased redness, swelling, or pain at the site of your wound.  You have fluid, blood, or pus coming from your wound.  You notice a bad smell coming from your wound or your dressing. SEEK IMMEDIATE MEDICAL CARE IF:  You have a red streak going away from your wound.   This information is not intended to replace advice given to you by your health care provider. Make sure you discuss any questions you have with your health care provider.   Document Released: 12/21/2007 Document Revised: 07/28/2014 Document Reviewed: 03/09/2014 Elsevier Interactive Patient  Education [employer].

## 2015-08-05 NOTE — Procedures (Signed)
Successful fluoroscopic guided retrieval of IVC filter. Access: R IJ vein. EBL: None No immediate post procedural complications.  Jay Kwadwo Taras, MD Pager #: [phone]      

## 2015-12-15 ENCOUNTER — Ambulatory Visit: Payer: Self-pay | Admitting: Surgery

## 2015-12-15 NOTE — H&P (Signed)
Krystal Campbell 12/15/2015 1:43 PM Location: [facility] Patient #: [id number] DOB: Mar 11, 1961 Married / Language: [name] / Race: White Female  History of Present Illness [name] A. Kavin Weckwerth MD; 12/15/2015 1:59 PM) Patient words: Patient returns for follow-up secondary to developing a bulge in her previous incision. She is 1 year out from a laparoscopic-assisted right hemicolectomy for stage III colon cancer. She has completed chemotherapy. She developed a painful bulge in a laparoscopic extraction site just above her umbilicus. It pops in and proximal out. She's undergone staging scans which are negative for metastatic disease. She is due for colonoscopy in about 2 weeks.  The patient is a 55 year old female.   Allergies [name], [place]; 12/15/2015 1:43 PM) No Known Drug Allergies 11/17/2014  Medication History [name], CMA; 12/15/2015 1:44 PM) Gabapentin (800MG  Tablet, Oral three times daily) Active. Vitamin B12 (1000MCG Tablet ER, Oral) Active. Fiber (Oral) Active. Melatonin (10MG  Tablet, Oral two at bedtime) Active. Fenofibrate (145MG  Tablet, Oral) Active. Pantoprazole Sodium (40MG  Tablet DR, Oral) Active. MetFORMIN HCl (1000MG  Tablet, Oral) Active. Medications Reconciled    Vitals [name] CMA; 12/15/2015 1:45 PM) 12/15/2015 1:44 PM Weight: 235.8 lb Height: 64in Body Surface Area: 2.1 m Body Mass Index: 40.47 kg/m  Temp.: 98.85F(Temporal)  Pulse: 82 (Regular)  BP: 130/70 (Sitting, Left Arm, Standard)      Physical Exam (Anni Hocevar A. Tan Clopper MD; 12/15/2015 2:00 PM)  General Mental Status-Alert. General Appearance-Consistent with stated age. Hydration-Well hydrated. Voice-Normal.  Chest and Lung Exam Note: Port-A-Cath right subclavian region  Cardiovascular Cardiovascular examination reveals -normal heart sounds, regular rate and rhythm with no murmurs and normal pedal pulses bilaterally.  Abdomen Note:  Reducible incisional hernia from laparoscopic extraction site just above the umbilicus. Soft nontender and reducible.  Musculoskeletal Normal Exam - Left-Upper Extremity Strength Normal and Lower Extremity Strength Normal. Normal Exam - Right-Upper Extremity Strength Normal and Lower Extremity Strength Normal.    Assessment & Plan (Kennedy Brines A. Hannahmarie Asberry MD; 12/15/2015 2:03 PM)  INCISIONAL HERNIA, WITHOUT OBSTRUCTION OR GANGRENE (K43.2) Impression: Discussed laparoscopic and open repair with her today. Discussed the use of mesh. Risks, benefits and alternatives were discussed. The hernia appears relatively small and therefore looks amendable to an open repair technique. The risk of hernia repair include bleeding, infection, organ injury, bowel injury, bladder injury, nerve injury recurrent hernia, blood clots, worsening of underlying condition, chronic pain, mesh use, open surgery, death, and the need for other operattions. Pt agrees to proceed  Current Plans You are being scheduled for surgery - Our schedulers will call you.  You should hear from our office's scheduling department within 5 working days about the location, date, and time of surgery. We try to make accommodations for patient's preferences in scheduling surgery, but sometimes the OR schedule or the surgeon's schedule prevents [place] from making those accommodations.  If you have not heard from our office [phone]) in 5 working days, call the office and ask for your surgeon's nurse.  If you have other questions about your diagnosis, plan, or surgery, call the office and ask for your surgeon's nurse.  The anatomy & physiology of the abdominal wall was discussed. The pathophysiology of hernias was discussed. Natural history risks without surgery including progeressive enlargement, pain, incarceration, & strangulation was discussed. Contributors to complications such as smoking, obesity, diabetes, prior surgery, etc were  discussed.  I feel the risks of no intervention will lead to serious problems that outweigh the operative risks; therefore, I recommended surgery to reduce  and repair the hernia. I explained laparoscopic techniques with possible need for an open approach. I noted the probable use of mesh to patch and/or buttress the hernia repair  Risks such as bleeding, infection, abscess, need for further treatment, heart attack, death, and other risks were discussed. I noted a good likelihood this will help address the problem. Goals of post-operative recovery were discussed as well. Possibility that this will not correct all symptoms was explained. I stressed the importance of low-impact activity, aggressive pain control, avoiding constipation, & not pushing through pain to minimize risk of post-operative chronic pain or injury. Possibility of reherniation especially with smoking, obesity, diabetes, immunosuppression, and other health conditions was discussed. We will work to minimize complications.  An educational handout further explaining the pathology & treatment options was given as well. Questions were answered. The patient expresses understanding & wishes to proceed with surgery.  Pt Education - Pamphlet Given - Hernia Surgery: discussed with patient and provided information. PERSONAL HISTORY OF COLON CANCER, STAGE II (Z85.038)

## 2016-01-11 ENCOUNTER — Encounter (HOSPITAL_BASED_OUTPATIENT_CLINIC_OR_DEPARTMENT_OTHER): Payer: Self-pay | Admitting: *Deleted

## 2016-01-13 ENCOUNTER — Encounter (HOSPITAL_BASED_OUTPATIENT_CLINIC_OR_DEPARTMENT_OTHER)
Admission: RE | Admit: 2016-01-13 | Discharge: 2016-01-13 | Disposition: A | Discharge status: Home / Self Care | Payer: BLUE CROSS/BLUE SHIELD | Source: Ambulatory Visit | Attending: Surgery | Admitting: Surgery

## 2016-01-13 DIAGNOSIS — E119 Type 2 diabetes mellitus without complications: Secondary | ICD-10-CM | POA: Diagnosis not present

## 2016-01-13 DIAGNOSIS — K432 Incisional hernia without obstruction or gangrene: Secondary | ICD-10-CM | POA: Diagnosis present

## 2016-01-13 DIAGNOSIS — K219 Gastro-esophageal reflux disease without esophagitis: Secondary | ICD-10-CM | POA: Diagnosis not present

## 2016-01-13 DIAGNOSIS — M1990 Unspecified osteoarthritis, unspecified site: Secondary | ICD-10-CM | POA: Diagnosis not present

## 2016-01-13 DIAGNOSIS — I10 Essential (primary) hypertension: Secondary | ICD-10-CM | POA: Diagnosis not present

## 2016-01-13 DIAGNOSIS — Z6841 Body Mass Index (BMI) 40.0 and over, adult: Secondary | ICD-10-CM | POA: Diagnosis not present

## 2016-01-13 DIAGNOSIS — F419 Anxiety disorder, unspecified: Secondary | ICD-10-CM | POA: Diagnosis not present

## 2016-01-13 DIAGNOSIS — R0789 Other chest pain: Secondary | ICD-10-CM | POA: Insufficient documentation

## 2016-01-13 DIAGNOSIS — Z79899 Other long term (current) drug therapy: Secondary | ICD-10-CM | POA: Diagnosis not present

## 2016-01-13 DIAGNOSIS — F329 Major depressive disorder, single episode, unspecified: Secondary | ICD-10-CM | POA: Diagnosis not present

## 2016-01-13 DIAGNOSIS — Z7984 Long term (current) use of oral hypoglycemic drugs: Secondary | ICD-10-CM | POA: Diagnosis not present

## 2016-01-13 LAB — CBC WITH DIFFERENTIAL/PLATELET
BASOS PCT: 1 %
Basophils Absolute: 0 10*3/uL (ref 0.0–0.1)
Eosinophils Absolute: 0.2 10*3/uL (ref 0.0–0.7)
Eosinophils Relative: 3 %
HEMATOCRIT: 39.9 % (ref 36.0–46.0)
Hemoglobin: 12.9 g/dL (ref 12.0–15.0)
LYMPHS ABS: 0.9 10*3/uL (ref 0.7–4.0)
LYMPHS PCT: 16 %
MCH: 30.7 pg (ref 26.0–34.0)
MCHC: 32.3 g/dL (ref 30.0–36.0)
MCV: 95 fL (ref 78.0–100.0)
MONO ABS: 0.4 10*3/uL (ref 0.1–1.0)
MONOS PCT: 7 %
NEUTROS ABS: 4.1 10*3/uL (ref 1.7–7.7)
Neutrophils Relative %: 73 %
Platelets: 295 10*3/uL (ref 150–400)
RBC: 4.2 MIL/uL (ref 3.87–5.11)
RDW: 13.6 % (ref 11.5–15.5)
WBC: 5.6 10*3/uL (ref 4.0–10.5)

## 2016-01-13 LAB — COMPREHENSIVE METABOLIC PANEL
ALK PHOS: 43 U/L (ref 38–126)
ALT: 22 U/L (ref 14–54)
ANION GAP: 9 (ref 5–15)
AST: 27 U/L (ref 15–41)
Albumin: 3.7 g/dL (ref 3.5–5.0)
BILIRUBIN TOTAL: 0.7 mg/dL (ref 0.3–1.2)
BUN: 13 mg/dL (ref 6–20)
CALCIUM: 9.2 mg/dL (ref 8.9–10.3)
CO2: 25 mmol/L (ref 22–32)
Chloride: 104 mmol/L (ref 101–111)
Creatinine, Ser: 1.1 mg/dL — ABNORMAL HIGH (ref 0.44–1.00)
GFR, EST NON AFRICAN AMERICAN: 55 mL/min — AB (ref >60)
Glucose, Bld: 113 mg/dL — ABNORMAL HIGH (ref 65–99)
POTASSIUM: 4.3 mmol/L (ref 3.5–5.1)
Sodium: 138 mmol/L (ref 135–145)
TOTAL PROTEIN: 6.8 g/dL (ref 6.5–8.1)

## 2016-01-17 ENCOUNTER — Encounter (HOSPITAL_BASED_OUTPATIENT_CLINIC_OR_DEPARTMENT_OTHER)
Admission: RE | Disposition: A | Discharge status: Home / Self Care | Payer: Self-pay | Source: Ambulatory Visit | Attending: Surgery

## 2016-01-17 ENCOUNTER — Ambulatory Visit (HOSPITAL_BASED_OUTPATIENT_CLINIC_OR_DEPARTMENT_OTHER): Payer: BLUE CROSS/BLUE SHIELD | Admitting: Anesthesiology

## 2016-01-17 ENCOUNTER — Ambulatory Visit (HOSPITAL_BASED_OUTPATIENT_CLINIC_OR_DEPARTMENT_OTHER)
Admission: RE | Admit: 2016-01-17 | Discharge: 2016-01-18 | Disposition: A | Discharge status: Home / Self Care | Payer: BLUE CROSS/BLUE SHIELD | Source: Ambulatory Visit | Attending: Surgery | Admitting: Surgery

## 2016-01-17 ENCOUNTER — Encounter (HOSPITAL_BASED_OUTPATIENT_CLINIC_OR_DEPARTMENT_OTHER): Payer: Self-pay | Admitting: Anesthesiology

## 2016-01-17 DIAGNOSIS — Z6841 Body Mass Index (BMI) 40.0 and over, adult: Secondary | ICD-10-CM | POA: Insufficient documentation

## 2016-01-17 DIAGNOSIS — I10 Essential (primary) hypertension: Secondary | ICD-10-CM | POA: Insufficient documentation

## 2016-01-17 DIAGNOSIS — Z7984 Long term (current) use of oral hypoglycemic drugs: Secondary | ICD-10-CM | POA: Insufficient documentation

## 2016-01-17 DIAGNOSIS — E119 Type 2 diabetes mellitus without complications: Secondary | ICD-10-CM | POA: Insufficient documentation

## 2016-01-17 DIAGNOSIS — K432 Incisional hernia without obstruction or gangrene: Secondary | ICD-10-CM | POA: Diagnosis not present

## 2016-01-17 DIAGNOSIS — Z9889 Other specified postprocedural states: Secondary | ICD-10-CM

## 2016-01-17 DIAGNOSIS — F419 Anxiety disorder, unspecified: Secondary | ICD-10-CM | POA: Insufficient documentation

## 2016-01-17 DIAGNOSIS — Z79899 Other long term (current) drug therapy: Secondary | ICD-10-CM | POA: Insufficient documentation

## 2016-01-17 DIAGNOSIS — Z8719 Personal history of other diseases of the digestive system: Secondary | ICD-10-CM

## 2016-01-17 DIAGNOSIS — F329 Major depressive disorder, single episode, unspecified: Secondary | ICD-10-CM | POA: Insufficient documentation

## 2016-01-17 DIAGNOSIS — M1990 Unspecified osteoarthritis, unspecified site: Secondary | ICD-10-CM | POA: Insufficient documentation

## 2016-01-17 DIAGNOSIS — K219 Gastro-esophageal reflux disease without esophagitis: Secondary | ICD-10-CM | POA: Insufficient documentation

## 2016-01-17 HISTORY — PX: INCISIONAL HERNIA REPAIR: SHX193

## 2016-01-17 HISTORY — DX: Incisional hernia without obstruction or gangrene: K43.2

## 2016-01-17 HISTORY — PX: INSERTION OF MESH: SHX5868

## 2016-01-17 LAB — GLUCOSE, CAPILLARY
GLUCOSE-CAPILLARY: 208 mg/dL — AB (ref 65–99)
Glucose-Capillary: 114 mg/dL — ABNORMAL HIGH (ref 65–99)
Glucose-Capillary: 195 mg/dL — ABNORMAL HIGH (ref 65–99)
Glucose-Capillary: 256 mg/dL — ABNORMAL HIGH (ref 65–99)

## 2016-01-17 SURGERY — REPAIR, HERNIA, INCISIONAL
Anesthesia: General | Site: Abdomen

## 2016-01-17 MED ORDER — DEXAMETHASONE SODIUM PHOSPHATE 10 MG/ML IJ SOLN
INTRAMUSCULAR | Status: AC
  Filled 2016-01-17: qty 1

## 2016-01-17 MED ORDER — CELECOXIB 400 MG PO CAPS
400.0000 mg | ORAL_CAPSULE | ORAL | Status: AC
  Administered 2016-01-17: 400 mg via ORAL

## 2016-01-17 MED ORDER — PHENYLEPHRINE 40 MCG/ML (10ML) SYRINGE FOR IV PUSH (FOR BLOOD PRESSURE SUPPORT)
PREFILLED_SYRINGE | INTRAVENOUS | Status: AC
  Filled 2016-01-17: qty 10

## 2016-01-17 MED ORDER — GLYCOPYRROLATE 0.2 MG/ML IJ SOLN: 0.2000 mg | Freq: Once | INTRAMUSCULAR | Status: DC | PRN

## 2016-01-17 MED ORDER — INSULIN ASPART 100 UNIT/ML ~~LOC~~ SOLN
0.0000 [IU] | Freq: Three times a day (TID) | SUBCUTANEOUS | Status: DC
  Administered 2016-01-17: 8 [IU] via SUBCUTANEOUS
  Filled 2016-01-17: qty 1

## 2016-01-17 MED ORDER — GABAPENTIN 800 MG PO TABS
800.0000 mg | ORAL_TABLET | Freq: Three times a day (TID) | ORAL | Status: DC
  Administered 2016-01-17 (×2): 800 mg via ORAL

## 2016-01-17 MED ORDER — 0.9 % SODIUM CHLORIDE (POUR BTL) OPTIME
TOPICAL | Status: DC | PRN
  Administered 2016-01-17: 400 mL

## 2016-01-17 MED ORDER — SODIUM CHLORIDE 0.9 % IV SOLN
250.0000 mL | INTRAVENOUS | Status: DC | PRN
  Administered 2016-01-17: 250 mL via INTRAVENOUS

## 2016-01-17 MED ORDER — INSULIN ASPART 100 UNIT/ML ~~LOC~~ SOLN
SUBCUTANEOUS | Status: AC
  Filled 2016-01-17: qty 1

## 2016-01-17 MED ORDER — ACETAMINOPHEN 500 MG PO TABS: 1000.0000 mg | ORAL_TABLET | Freq: Four times a day (QID) | ORAL | Status: DC

## 2016-01-17 MED ORDER — ACETAMINOPHEN 500 MG PO TABS
1000.0000 mg | ORAL_TABLET | ORAL | Status: AC
  Administered 2016-01-17: 1000 mg via ORAL

## 2016-01-17 MED ORDER — GABAPENTIN 300 MG PO CAPS: 300.0000 mg | ORAL_CAPSULE | ORAL | Status: DC

## 2016-01-17 MED ORDER — DEXTROSE 5 % IV SOLN
3.0000 g | INTRAVENOUS | Status: AC
  Administered 2016-01-17: 3 g via INTRAVENOUS

## 2016-01-17 MED ORDER — MIDAZOLAM HCL 5 MG/5ML IJ SOLN
INTRAMUSCULAR | Status: DC | PRN
  Administered 2016-01-17: 2 mg via INTRAVENOUS

## 2016-01-17 MED ORDER — LIDOCAINE 2% (20 MG/ML) 5 ML SYRINGE
INTRAMUSCULAR | Status: AC
  Filled 2016-01-17: qty 5

## 2016-01-17 MED ORDER — PHENYLEPHRINE 40 MCG/ML (10ML) SYRINGE FOR IV PUSH (FOR BLOOD PRESSURE SUPPORT)
PREFILLED_SYRINGE | INTRAVENOUS | Status: DC | PRN
  Administered 2016-01-17: 80 ug via INTRAVENOUS

## 2016-01-17 MED ORDER — MORPHINE SULFATE (PF) 2 MG/ML IV SOLN: 2.0000 mg | INTRAVENOUS | Status: DC | PRN

## 2016-01-17 MED ORDER — EPHEDRINE 5 MG/ML INJ
INTRAVENOUS | Status: AC
  Filled 2016-01-17: qty 10

## 2016-01-17 MED ORDER — FENTANYL CITRATE (PF) 100 MCG/2ML IJ SOLN
INTRAMUSCULAR | Status: AC
  Filled 2016-01-17: qty 2

## 2016-01-17 MED ORDER — LACTATED RINGERS IV SOLN
INTRAVENOUS | Status: DC
  Administered 2016-01-17: 10:00:00 via INTRAVENOUS

## 2016-01-17 MED ORDER — SCOPOLAMINE 1 MG/3DAYS TD PT72: 1.0000 | MEDICATED_PATCH | Freq: Once | TRANSDERMAL | Status: DC | PRN

## 2016-01-17 MED ORDER — KETOROLAC TROMETHAMINE 30 MG/ML IJ SOLN
INTRAMUSCULAR | Status: AC
  Filled 2016-01-17: qty 1

## 2016-01-17 MED ORDER — ACETAMINOPHEN 500 MG PO TABS
ORAL_TABLET | ORAL | Status: AC
  Filled 2016-01-17: qty 2

## 2016-01-17 MED ORDER — CITALOPRAM HYDROBROMIDE 20 MG PO TABS
20.0000 mg | ORAL_TABLET | Freq: Three times a day (TID) | ORAL | Status: DC
  Administered 2016-01-17 (×2): 20 mg via ORAL

## 2016-01-17 MED ORDER — LIDOCAINE HCL (CARDIAC) 20 MG/ML IV SOLN
INTRAVENOUS | Status: DC | PRN
  Administered 2016-01-17: 30 mg via INTRAVENOUS

## 2016-01-17 MED ORDER — SUGAMMADEX SODIUM 200 MG/2ML IV SOLN
INTRAVENOUS | Status: DC | PRN
  Administered 2016-01-17: 200 mg via INTRAVENOUS

## 2016-01-17 MED ORDER — CELECOXIB 200 MG PO CAPS
ORAL_CAPSULE | ORAL | Status: AC
  Filled 2016-01-17: qty 2

## 2016-01-17 MED ORDER — CHLORHEXIDINE GLUCONATE CLOTH 2 % EX PADS: 6.0000 | MEDICATED_PAD | Freq: Once | CUTANEOUS | Status: DC

## 2016-01-17 MED ORDER — FENOFIBRATE 54 MG PO TABS: 54.0000 mg | ORAL_TABLET | Freq: Every day | ORAL | Status: DC

## 2016-01-17 MED ORDER — SUCCINYLCHOLINE CHLORIDE 20 MG/ML IJ SOLN
INTRAMUSCULAR | Status: DC | PRN
  Administered 2016-01-17: 50 mg via INTRAVENOUS

## 2016-01-17 MED ORDER — ASPIRIN EC 81 MG PO TBEC: 81.0000 mg | DELAYED_RELEASE_TABLET | Freq: Every day | ORAL | Status: DC

## 2016-01-17 MED ORDER — BUPIVACAINE-EPINEPHRINE 0.25% -1:200000 IJ SOLN
INTRAMUSCULAR | Status: DC | PRN
  Administered 2016-01-17: 27 mL

## 2016-01-17 MED ORDER — EPHEDRINE SULFATE 50 MG/ML IJ SOLN
INTRAMUSCULAR | Status: DC | PRN
  Administered 2016-01-17: 10 mg via INTRAVENOUS

## 2016-01-17 MED ORDER — OXYCODONE-ACETAMINOPHEN 5-325 MG PO TABS: 1.0000 | ORAL_TABLET | ORAL | 0 refills | Status: DC | PRN

## 2016-01-17 MED ORDER — ACETAMINOPHEN 650 MG RE SUPP: 650.0000 mg | RECTAL | Status: DC | PRN

## 2016-01-17 MED ORDER — SUGAMMADEX SODIUM 200 MG/2ML IV SOLN
INTRAVENOUS | Status: AC
  Filled 2016-01-17: qty 2

## 2016-01-17 MED ORDER — KETOROLAC TROMETHAMINE 30 MG/ML IJ SOLN
INTRAMUSCULAR | Status: DC | PRN
  Administered 2016-01-17: 30 mg via INTRAVENOUS

## 2016-01-17 MED ORDER — PROMETHAZINE HCL 25 MG/ML IJ SOLN: 6.2500 mg | INTRAMUSCULAR | Status: DC | PRN

## 2016-01-17 MED ORDER — MIDAZOLAM HCL 2 MG/2ML IJ SOLN: 1.0000 mg | INTRAMUSCULAR | Status: DC | PRN

## 2016-01-17 MED ORDER — PROPOFOL 10 MG/ML IV BOLUS
INTRAVENOUS | Status: DC | PRN
  Administered 2016-01-17: 200 mg via INTRAVENOUS

## 2016-01-17 MED ORDER — DEXAMETHASONE SODIUM PHOSPHATE 4 MG/ML IJ SOLN
INTRAMUSCULAR | Status: DC | PRN
  Administered 2016-01-17: 10 mg via INTRAVENOUS

## 2016-01-17 MED ORDER — FENTANYL CITRATE (PF) 100 MCG/2ML IJ SOLN
25.0000 ug | INTRAMUSCULAR | Status: DC | PRN
  Administered 2016-01-17: 50 ug via INTRAVENOUS

## 2016-01-17 MED ORDER — ONDANSETRON HCL 4 MG/2ML IJ SOLN
INTRAMUSCULAR | Status: AC
  Filled 2016-01-17: qty 2

## 2016-01-17 MED ORDER — SODIUM CHLORIDE 0.9% FLUSH: 3.0000 mL | INTRAVENOUS | Status: DC | PRN

## 2016-01-17 MED ORDER — PROPOFOL 10 MG/ML IV BOLUS
INTRAVENOUS | Status: AC
  Filled 2016-01-17: qty 20

## 2016-01-17 MED ORDER — FENTANYL CITRATE (PF) 100 MCG/2ML IJ SOLN
25.0000 ug | INTRAMUSCULAR | Status: DC | PRN
  Administered 2016-01-17 (×3): 50 ug via INTRAVENOUS

## 2016-01-17 MED ORDER — KETOROLAC TROMETHAMINE 30 MG/ML IJ SOLN
30.0000 mg | Freq: Four times a day (QID) | INTRAMUSCULAR | Status: DC
  Administered 2016-01-17 – 2016-01-18 (×3): 30 mg via INTRAVENOUS

## 2016-01-17 MED ORDER — ROCURONIUM BROMIDE 10 MG/ML (PF) SYRINGE
PREFILLED_SYRINGE | INTRAVENOUS | Status: AC
  Filled 2016-01-17: qty 10

## 2016-01-17 MED ORDER — PANTOPRAZOLE SODIUM 40 MG PO TBEC
40.0000 mg | DELAYED_RELEASE_TABLET | Freq: Two times a day (BID) | ORAL | Status: DC
  Administered 2016-01-17: 40 mg via ORAL

## 2016-01-17 MED ORDER — ACETAMINOPHEN 325 MG PO TABS: 650.0000 mg | ORAL_TABLET | ORAL | Status: DC | PRN

## 2016-01-17 MED ORDER — BUPIVACAINE-EPINEPHRINE (PF) 0.25% -1:200000 IJ SOLN
INTRAMUSCULAR | Status: AC
  Filled 2016-01-17: qty 30

## 2016-01-17 MED ORDER — SODIUM CHLORIDE 0.9% FLUSH: 3.0000 mL | Freq: Two times a day (BID) | INTRAVENOUS | Status: DC

## 2016-01-17 MED ORDER — METFORMIN HCL 500 MG PO TABS
1000.0000 mg | ORAL_TABLET | Freq: Two times a day (BID) | ORAL | Status: DC
  Administered 2016-01-17: 1000 mg via ORAL

## 2016-01-17 MED ORDER — ROCURONIUM BROMIDE 100 MG/10ML IV SOLN
INTRAVENOUS | Status: DC | PRN
  Administered 2016-01-17: 10 mg via INTRAVENOUS
  Administered 2016-01-17: 20 mg via INTRAVENOUS
  Administered 2016-01-17: 10 mg via INTRAVENOUS

## 2016-01-17 MED ORDER — OXYCODONE HCL 5 MG PO TABS
5.0000 mg | ORAL_TABLET | ORAL | Status: DC | PRN
  Administered 2016-01-17 – 2016-01-18 (×4): 10 mg via ORAL
  Filled 2016-01-17: qty 2
  Filled 2016-01-17: qty 1
  Filled 2016-01-17 (×2): qty 2

## 2016-01-17 MED ORDER — FENTANYL CITRATE (PF) 100 MCG/2ML IJ SOLN
INTRAMUSCULAR | Status: DC | PRN
  Administered 2016-01-17: 100 ug via INTRAVENOUS
  Administered 2016-01-17: 50 ug via INTRAVENOUS
  Administered 2016-01-17 (×2): 25 ug via INTRAVENOUS

## 2016-01-17 MED ORDER — FENTANYL CITRATE (PF) 100 MCG/2ML IJ SOLN: 50.0000 ug | INTRAMUSCULAR | Status: DC | PRN

## 2016-01-17 MED ORDER — CEFAZOLIN SODIUM-DEXTROSE 2-4 GM/100ML-% IV SOLN
INTRAVENOUS | Status: AC
  Filled 2016-01-17: qty 200

## 2016-01-17 MED ORDER — ONDANSETRON HCL 4 MG/2ML IJ SOLN
INTRAMUSCULAR | Status: DC | PRN
  Administered 2016-01-17: 4 mg via INTRAVENOUS

## 2016-01-17 MED ORDER — LACTATED RINGERS IV SOLN
500.0000 mL | INTRAVENOUS | Status: DC
  Administered 2016-01-17: 1000 mL via INTRAVENOUS

## 2016-01-17 SURGICAL SUPPLY — 52 items
BINDER ABDOMINAL 12 SM 30-45 (SOFTGOODS) ×3 IMPLANT
BLADE CLIPPER SURG (BLADE) IMPLANT
BLADE SURG 10 STRL SS (BLADE) IMPLANT
BLADE SURG 15 STRL LF DISP TIS (BLADE) ×1 IMPLANT
BLADE SURG 15 STRL SS (BLADE) ×2
CANISTER SUCT 1200ML W/VALVE (MISCELLANEOUS) IMPLANT
CHLORAPREP W/TINT 26ML (MISCELLANEOUS) ×3 IMPLANT
COVER BACK TABLE 60X90IN (DRAPES) ×3 IMPLANT
COVER MAYO STAND STRL (DRAPES) ×3 IMPLANT
DECANTER SPIKE VIAL GLASS SM (MISCELLANEOUS) IMPLANT
DERMABOND ADVANCED (GAUZE/BANDAGES/DRESSINGS) ×2
DERMABOND ADVANCED .7 DNX12 (GAUZE/BANDAGES/DRESSINGS) ×1 IMPLANT
DRAIN CHANNEL 19F RND (DRAIN) ×3 IMPLANT
DRAPE LAPAROTOMY TRNSV 102X78 (DRAPE) ×3 IMPLANT
DRAPE UTILITY XL STRL (DRAPES) ×3 IMPLANT
ELECT COATED BLADE 2.86 ST (ELECTRODE) ×3 IMPLANT
ELECT REM PT RETURN 9FT ADLT (ELECTROSURGICAL) ×3
ELECTRODE REM PT RTRN 9FT ADLT (ELECTROSURGICAL) ×1 IMPLANT
EVACUATOR SILICONE 100CC (DRAIN) ×3 IMPLANT
GAUZE SPONGE 4X4 12PLY STRL (GAUZE/BANDAGES/DRESSINGS) ×3 IMPLANT
GLOVE BIOGEL PI IND STRL 7.0 (GLOVE) ×2 IMPLANT
GLOVE BIOGEL PI IND STRL 8 (GLOVE) ×1 IMPLANT
GLOVE BIOGEL PI INDICATOR 7.0 (GLOVE) ×4
GLOVE BIOGEL PI INDICATOR 8 (GLOVE) ×2
GLOVE ECLIPSE 6.5 STRL STRAW (GLOVE) ×6 IMPLANT
GLOVE ECLIPSE 8.0 STRL XLNG CF (GLOVE) ×3 IMPLANT
GLOVE EXAM NITRILE EXT CUFF MD (GLOVE) ×3 IMPLANT
GOWN STRL REUS W/ TWL LRG LVL3 (GOWN DISPOSABLE) ×3 IMPLANT
GOWN STRL REUS W/TWL LRG LVL3 (GOWN DISPOSABLE) ×6
MESH VENTRALEX ST 8CM LRG (Mesh General) ×3 IMPLANT
NEEDLE HYPO 25X1 1.5 SAFETY (NEEDLE) ×3 IMPLANT
NS IRRIG 1000ML POUR BTL (IV SOLUTION) ×3 IMPLANT
PACK BASIN DAY SURGERY FS (CUSTOM PROCEDURE TRAY) ×3 IMPLANT
PENCIL BUTTON HOLSTER BLD 10FT (ELECTRODE) ×3 IMPLANT
SLEEVE SCD COMPRESS KNEE MED (MISCELLANEOUS) ×3 IMPLANT
SPONGE LAP 4X18 X RAY DECT (DISPOSABLE) ×3 IMPLANT
STAPLER VISISTAT 35W (STAPLE) IMPLANT
SUT MON AB 4-0 PC3 18 (SUTURE) ×3 IMPLANT
SUT NOVA NAB DX-16 0-1 5-0 T12 (SUTURE) ×9 IMPLANT
SUT NOVA NAB GS-22 2 0 T19 (SUTURE) IMPLANT
SUT SILK 3 0 SH 30 (SUTURE) IMPLANT
SUT VIC AB 2-0 SH 27 (SUTURE) ×2
SUT VIC AB 2-0 SH 27XBRD (SUTURE) ×1 IMPLANT
SUT VIC AB 3-0 SH 27 (SUTURE) ×2
SUT VIC AB 3-0 SH 27X BRD (SUTURE) ×1 IMPLANT
SUT VICRYL 3-0 CR8 SH (SUTURE) IMPLANT
SYR CONTROL 10ML LL (SYRINGE) ×3 IMPLANT
TOWEL OR 17X24 6PK STRL BLUE (TOWEL DISPOSABLE) ×3 IMPLANT
TOWEL OR NON WOVEN STRL DISP B (DISPOSABLE) IMPLANT
TUBE CONNECTING 20'X1/4 (TUBING) ×1
TUBE CONNECTING 20X1/4 (TUBING) ×2 IMPLANT
YANKAUER SUCT BULB TIP NO VENT (SUCTIONS) ×3 IMPLANT

## 2016-01-17 NOTE — Anesthesia Postprocedure Evaluation (Signed)
Anesthesia Post Note  Patient: Krystal Campbell  Procedure(s) Performed: Procedure(s) (LRB): INCISIONAL HERNIA REPAIR (N/A) INSERTION OF MESH (N/A)  Patient location during evaluation: PACU Anesthesia Type: General Level of consciousness: awake and alert Pain management: pain level controlled Vital Signs Assessment: post-procedure vital signs reviewed and stable Respiratory status: spontaneous breathing, nonlabored ventilation, respiratory function stable and patient connected to nasal cannula oxygen Cardiovascular status: blood pressure returned to baseline and stable Postop Assessment: no signs of nausea or vomiting Anesthetic complications: no    Last Vitals:  Vitals:   01/17/16 1415 01/17/16 1430  BP: 126/78 (!) 144/77  Pulse: 92 91  Resp: 18 18  Temp:  36.4 C    Last Pain:  Vitals:   01/17/16 1430  TempSrc:   PainSc: 0-No pain                 Shawnay Bramel J

## 2016-01-17 NOTE — Op Note (Signed)
Preoperative diagnosis: Incisional hernia without obstruction or gangrene  Postoperative diagnosis: Same  Procedure: Open repair of 4 cm incisional hernia with ventral light coated circular mesh  Surgeon: Erroll Luna M.D.  Anesthesia: Gen. with 0.25% Sensorcaine local with epinephrine  Drains 19 round Blake drain to subcutaneous tissues  EBL: 30 mL  Specimens: None  Indications for procedure: Patient presents for repair of a small hernia at her extraction site after a laparoscopic right hemicolectomy last year. The hernia small and we discussed both laparoscopic and open techniques. The pros and cons of each were discussed as well as the potential use of mesh. Risks, benefits and alternatives to surgery were discussed.The risk of hernia repair include bleeding,  Infection,   Recurrence of the hernia,  Mesh use, chronic pain,  Organ injury,  Bowel injury,  Bladder injury,   nerve injury with numbness around the incision,  Death,  and worsening of preexisting  medical problems.  The alternatives to surgery have been discussed as well..  Long term expectations of both operative and non operative treatments have been discussed.   The patient agrees to proceed.  Description of procedure: The patient was met in the holding area and questions were answered. She was taken back to the operative room and placed upon the OR table. After induction of general anesthesia, the abdomen was prepped and draped in a sterile fashion and timeout was done. She received appropriate preoperative antibiotics. The hernia was located above the umbilicus the scar was identified. 0.25% Sensorcaine was infiltrated around the edge the scar which was approximately 5 cm. Healed scar was excised. Dissection was carried down to the incision until hernia defect was identified which was approximately 4 cm x 4 cm. I dissected the hernia sac away from the fascia. I cleared all fascial edges for 3 cm. I opened the hernia sac and there  were no intra-abdominal contents within it. I excised the excess hernia sac and passed off the field. There is no signs of any recurrent cancer. In a semicircular ventral light coated mesh was used. This was placed in a sub-Vachel position. The edges the mesh were circumferentially tacked the undersurface of the fascia giving at least a 3 cm over underlapped. There are no bowel contents trapped under the mesh are within the mesh. Once his secured I felt between the mesh near bowel wall. I then closed the fascia with number 1 Novafil. Given her obesity I placed a drain subcutaneous fatty tissues since I was concerned for large seroma. This is brought out through separate stab incision. This is placed to bulb suction. Abdominal binder placed. Liquid adhesive used as dressing. All final counts correct. The patient was awoke extubated taken to recovery in satisfactory condition.

## 2016-01-17 NOTE — Discharge Instructions (Signed)
[facility] _______[facility] ° °UMBILICAL OR INGUINAL HERNIA REPAIR: POST OP INSTRUCTIONS ° °Always review your discharge instruction sheet given to you by the facility where your surgery was performed. °IF YOU HAVE DISABILITY OR FAMILY LEAVE FORMS, YOU MUST BRING THEM TO THE OFFICE FOR PROCESSING.   °DO NOT GIVE THEM TO YOUR DOCTOR. ° °1. A  prescription for pain medication may be given to you upon discharge.  Take your pain medication as prescribed, if needed.  If narcotic pain medicine is not needed, then you may take acetaminophen (Tylenol) or ibuprofen (Advil) as needed. °2. Take your usually prescribed medications unless otherwise directed. °If you need a refill on your pain medication, please contact your pharmacy.  They will contact our office to request authorization. Prescriptions will not be filled after 5 pm or on week-ends. °3. You should follow a light diet the first 24 hours after arrival home, such as soup and crackers, etc.  Be sure to include lots of fluids daily.  Resume your normal diet the day after surgery. °4.Most patients will experience some swelling and bruising around the umbilicus or in the groin and scrotum.  Ice packs and reclining will help.  Swelling and bruising can take several days to resolve.  °6. It is common to experience some constipation if taking pain medication after surgery.  Increasing fluid intake and taking a stool softener (such as Colace) will usually help or prevent this problem from occurring.  A mild laxative (Milk of Magnesia or Miralax) should be taken according to package directions if there are no bowel movements after 48 hours. °7. Unless discharge instructions indicate otherwise, you may remove your bandages 24-48 hours after surgery, and you may shower at that time.  You may have steri-strips (small skin tapes) in place directly over the incision.  These strips should be left on the skin for 7-10 days.  If your surgeon used skin glue on the  incision, you may shower in 24 hours.  The glue will flake off over the next 2-3 weeks.  Any sutures or staples will be removed at the office during your follow-up visit. °8. ACTIVITIES:  You may resume regular (light) daily activities beginning the next day--such as daily self-care, walking, climbing stairs--gradually increasing activities as tolerated.  You may have sexual intercourse when it is comfortable.  Refrain from any heavy lifting or straining until approved by your doctor. ° °a.You may drive when you are no longer taking prescription pain medication, you can comfortably wear a seatbelt, and you can safely maneuver your car and apply brakes. °b.RETURN TO WORK:   °_____________________________________________ ° °9.You should see your doctor in the office for a follow-up appointment approximately 2-3 weeks after your surgery.  Make sure that you call for this appointment within a day or two after you arrive home to insure a convenient appointment time. °10.OTHER INSTRUCTIONS: _________________________ °   _____________________________________ ° °WHEN TO CALL YOUR DOCTOR: °1. Fever over 101.0 °2. Inability to urinate °3. Nausea and/or vomiting °4. Extreme swelling or bruising °5. Continued bleeding from incision. °6. Increased pain, redness, or drainage from the incision ° °The clinic staff is available to answer your questions during regular business hours.  Please don’t hesitate to call and ask to speak to one of the nurses for clinical concerns.  If you have a medical emergency, go to the nearest emergency room or call 911.  A surgeon from [facility] is always on call at the hospital ° ° °  [address] [place], [place], [place]  [address] ?  [address], [place],    [address] [phone] ? [phone] ? FAX [phone] Web site: [email or web address]      Bulb Drain Home Care A bulb drain consists of a thin rubber tube and a soft, round bulb that  creates a gentle suction. The rubber tube is placed in the area where you had surgery. A bulb is attached to the end of the tube that is outside the body. The bulb drain removes excess fluid that normally builds up in a surgical wound after surgery. The color and amount of fluid will vary. Immediately after surgery, the fluid is bright red and is a little thicker than water. It may gradually change to a yellow or pink color and become more thin and water-like. When the amount decreases to about 1 or 2 tbsp in 24 hours, your health care provider will usually remove it. DAILY CARE  Keep the bulb flat (compressed) at all times, except while emptying it. The flatness creates suction. You can flatten the bulb by squeezing it firmly in the middle and then closing the cap.  Keep sites where the tube enters the skin dry and covered with a bandage (dressing).  Secure the tube 1-2 in (2.5-5.1 cm) below the insertion sites to keep it from pulling on your stitches. The tube is stitched in place and will not slip out.  Secure the bulb as directed by your health care provider.  For the first 3 days after surgery, there usually is more fluid in the bulb. Empty the bulb whenever it becomes half full because the bulb does not create enough suction if it is too full. The bulb could also overflow. Write down how much fluid you remove each time you empty your drain. Add up the amount removed in 24 hours.  Empty the bulb at the same time every day once the amount of fluid decreases and you only need to empty it once a day. Write down the amounts and the 24-hour totals to give to your health care provider. This helps your health care provider know when the tubes can be removed. EMPTYING THE BULB DRAIN Before emptying the bulb, get a measuring cup, a piece of paper and a pen, and wash your hands.  Gently run your fingers down the tube (stripping) to empty any drainage from the tubing into the bulb. This may need to be  done several times a day to clear the tubing of clots and tissue.  Open the bulb cap to release suction, which causes it to inflate. Do not touch the inside of the cap.  Gently run your fingers down the tube (stripping) to empty any drainage from the tubing into the bulb.  Hold the cap out of the way, and pour fluid into the measuring cup.   Squeeze the bulb to provide suction.  Replace the cap.   Check the tape that holds the tube to your skin. If it is becoming loose, you can remove the loose piece of tape and apply a new one. Then, pin the bulb to your shirt.   Write down the amount of fluid you emptied out. Write down the date and each time you emptied your bulb drain. (If there are 2 bulbs, note the amount of drainage from each bulb and keep the totals separate. Your health care provider will want to know the total amounts for each drain and which tube is draining more.)  Flush the fluid down the toilet and wash your hands.   Call your health care provider once you have less than 2 tbsp of fluid collecting in the bulb drain every 24 hours. If there is drainage around the tube site, change dressings and keep the area dry. Cleanse around tube with sterile saline and place dry gauze around site. This gauze should be changed when it is soiled. If it stays clean and unsoiled, it should still be changed daily.  SEEK MEDICAL CARE IF:  Your drainage has a bad smell or is cloudy.   You have a fever.   Your drainage is increasing instead of decreasing.   Your tube fell out.   You have redness or swelling around the tube site.   You have drainage from a surgical wound.   Your bulb drain will not stay flat after you empty it.  MAKE SURE YOU:   Understand these instructions.  Will watch your condition.  Will get help right away if you are not doing well or get worse.   This information is not intended to replace advice given to you by your health care provider. Make  sure you discuss any questions you have with your health care provider.   Document Released: 03/10/2000 Document Revised: 04/03/2014 Document Reviewed: 09/30/2014 Elsevier Interactive Patient Education 2016 [employer].  About my Jackson-Pratt Bulb Drain  What is a Jackson-Pratt bulb? A Jackson-Pratt is a soft, round device used to collect drainage. It is connected to a long, thin drainage catheter, which is held in place by one or two small stiches near your surgical incision site. When the bulb is squeezed, it forms a vacuum, forcing the drainage to empty into the bulb.  Emptying the Jackson-Pratt bulb- To empty the bulb: 1. Release the plug on the top of the bulb. 2. Pour the bulb's contents into a measuring container which your nurse will provide. 3. Record the time emptied and amount of drainage. Empty the drain(s) as often as your     doctor or nurse recommends.  Date                  Time                    Amount (Drain 1)                 Amount (Drain 2)  _____________________________________________________________________  _____________________________________________________________________  _____________________________________________________________________  _____________________________________________________________________  _____________________________________________________________________  _____________________________________________________________________  _____________________________________________________________________  _____________________________________________________________________  Squeezing the Jackson-Pratt Bulb- To squeeze the bulb: 1. Make sure the plug at the top of the bulb is open. 2. Squeeze the bulb tightly in your fist. You will hear air squeezing from the bulb. 3. Replace the plug while the bulb is squeezed. 4. Use a safety pin to attach the bulb to your clothing. This will keep the catheter from     pulling at the bulb  insertion site.  When to call your doctor- Call your doctor if:  Drain site becomes red, swollen or hot.  You have a fever greater than 101 degrees F.  There is oozing at the drain site.  Drain falls out (apply a guaze bandage over the drain hole and secure it with tape).  Drainage increases daily not related to activity patterns. (You will usually have more drainage when you are active than when you are resting.)  Drainage has a bad odor.

## 2016-01-17 NOTE — Anesthesia Preprocedure Evaluation (Addendum)
Anesthesia Evaluation  Patient identified by MRN, date of birth, ID band Patient awake    Reviewed: Allergy & Precautions, NPO status , Patient's Chart, lab work & pertinent test results  Airway Mallampati: II  TM Distance: >3 FB Neck ROM: Full    Dental no notable dental hx.    Pulmonary pneumonia, resolved,    Pulmonary exam normal breath sounds clear to auscultation       Cardiovascular hypertension, Normal cardiovascular exam Rhythm:Regular Rate:Normal  ECHO 10-27-14: Study Conclusions  - Left ventricle: The cavity size was normal. Wall thickness was   normal. Systolic function was normal. The estimated ejection   fraction was in the range of 60% to 65%. Wall motion was normal;   there were no regional wall motion abnormalities. - Left atrium: The atrium was mildly dilated.   Neuro/Psych PSYCHIATRIC DISORDERS Anxiety Depression  Neuromuscular disease    GI/Hepatic Neg liver ROS, hiatal hernia, GERD  Medicated,  Endo/Other  diabetes, Type 2, Oral Hypoglycemic AgentsMorbid obesity  Renal/GU negative Renal ROS  negative genitourinary   Musculoskeletal  (+) Arthritis ,   Abdominal (+) + obese,   Peds negative pediatric ROS (+)  Hematology  (+) anemia ,   Anesthesia Other Findings   Reproductive/Obstetrics negative OB ROS                            Anesthesia Physical Anesthesia Plan  ASA: III  Anesthesia Plan: General   Post-op Pain Management:    Induction: Intravenous  Airway Management Planned: LMA  Additional Equipment:   Intra-op Plan:   Post-operative Plan: Extubation in OR  Informed Consent: I have reviewed the patients History and Physical, chart, labs and discussed the procedure including the risks, benefits and alternatives for the proposed anesthesia with the patient or authorized representative who has indicated his/her understanding and acceptance.   Dental  advisory given  Plan Discussed with: CRNA  Anesthesia Plan Comments:         Anesthesia Quick Evaluation

## 2016-01-17 NOTE — Transfer of Care (Signed)
Immediate Anesthesia Transfer of Care Note  Patient: Krystal Campbell  Procedure(s) Performed: Procedure(s): INCISIONAL HERNIA REPAIR (N/A) INSERTION OF MESH (N/A)  Patient Location: PACU  Anesthesia Type:General  Level of Consciousness: awake and patient cooperative  Airway & Oxygen Therapy: Patient Spontanous Breathing and Patient connected to face mask oxygen  Post-op Assessment: Report given to RN and Post -op Vital signs reviewed and stable  Post vital signs: Reviewed and stable  Last Vitals:  Vitals:   01/17/16 0924 01/17/16 1323  BP: 113/77 120/63  Pulse: 98 93  Resp: 18 15  Temp: 37.1 C     Last Pain:  Vitals:   01/17/16 0924  TempSrc: Oral         Complications: No apparent anesthesia complications

## 2016-01-17 NOTE — Interval H&P Note (Signed)
History and Physical Interval Note:  01/17/2016 10:56 AM  Krystal Campbell  has presented today for surgery, with the diagnosis of incisional hernia  The various methods of treatment have been discussed with the patient and family. After consideration of risks, benefits and other options for treatment, the patient has consented to  Procedure(s): HERNIA REPAIR INCISIONAL (N/A) INSERTION OF MESH (N/A) as a surgical intervention .  The patient's history has been reviewed, patient examined, no change in status, stable for surgery.  I have reviewed the patient's chart and labs.  Questions were answered to the patient's satisfaction.     [name] A.

## 2016-01-17 NOTE — Anesthesia Procedure Notes (Signed)
Procedure Name: Intubation Date/Time: 01/17/2016 11:17 AM Performed by: Marrianne Mood Pre-anesthesia Checklist: Patient identified, Emergency Drugs available, Suction available, Patient being monitored and Timeout performed Patient Re-evaluated:Patient Re-evaluated prior to inductionOxygen Delivery Method: Circle system utilized Preoxygenation: Pre-oxygenation with 100% oxygen Intubation Type: IV induction Ventilation: Mask ventilation without difficulty Laryngoscope Size: Miller and 3 Grade View: Grade II Tube type: Oral Tube size: 7.0 mm Number of attempts: 1 Airway Equipment and Method: Stylet and Oral airway Placement Confirmation: ETT inserted through vocal cords under direct vision,  positive ETCO2 and breath sounds checked- equal and bilateral Secured at: 20 cm Tube secured with: Tape Dental Injury: Teeth and Oropharynx as per pre-operative assessment

## 2016-01-17 NOTE — H&P (Signed)
Pre-op/Pre-procedure Orders Open   [facility]  Erroll Luna, MD  General Surgery   Additional Documentation       All Notes   H&P by Erroll Luna, MD a   Author: Erroll Luna, MD Author Type: Physician Filed: 68 2:04 PM  Note Status: Signed Cosign: Cosign Not Required Encounter Date: 2017  Editor: Erroll Luna, MD (Physician)    Georg Ruddle   Location: [facility] Patient #: [id number] DOB: 1960/12/26 Married / Language: [name] / Race: White Female  History of Present Illness [name] A. Deundra Furber MD Patient words: Patient returns for follow-up secondary to developing a bulge in her previous incision. She is 1 year out from a laparoscopic-assisted right hemicolectomy for stage III colon cancer. She has completed chemotherapy. She developed a painful bulge in a laparoscopic extraction site just above her umbilicus. It pops in and proximal out. She's undergone staging scans which are negative for metastatic disease. She is due for colonoscopy in about 2 weeks.  The patient is a 55 year old female.   Allergies [name], CMA;  No Known Drug Allergies   Medication History [name] Gabapentin (800MG  Tablet, Oral three times daily) Active. Vitamin B12 (1000MCG Tablet ER, Oral) Active. Fiber (Oral) Active. Melatonin (10MG  Tablet, Oral two at bedtime) Active. Fenofibrate (145MG  Tablet, Oral) Active. Pantoprazole Sodium (40MG  Tablet DR, Oral) Active. MetFORMIN HCl (1000MG  Tablet, Oral) Active. Medications Reconciled    Vitals [name] CMA;  12/15/2015 1:44 PM Weight: 235.8 lb Height: 64in Body Surface Area: 2.1 m Body Mass Index: 40.47 kg/m  Temp.: 98.53F(Temporal)  Pulse: 82 (Regular)  BP: 130/70 (Sitting, Left Arm, Standard)      Physical Exam (Dimond Crotty A. Mirren Gest MD  General Mental Status-Alert. General Appearance-Consistent with stated age. Hydration-Well  hydrated. Voice-Normal.  Chest and Lung Exam Note: Port-A-Cath right subclavian region  Cardiovascular Cardiovascular examination reveals -normal heart sounds, regular rate and rhythm with no murmurs and normal pedal pulses bilaterally.  Abdomen Note: Reducible incisional hernia from laparoscopic extraction site just above the umbilicus. Soft nontender and reducible.  Musculoskeletal Normal Exam - Left-Upper Extremity Strength Normal and Lower Extremity Strength Normal. Normal Exam - Right-Upper Extremity Strength Normal and Lower Extremity Strength Normal.    Assessment & Plan (Lemonte Al A. Dawson Hollman MD  INCISIONAL HERNIA, WITHOUT OBSTRUCTION OR GANGRENE (K43.2) Impression: Discussed laparoscopic and open repair with her today. Discussed the use of mesh. Risks, benefits and alternatives were discussed. The hernia appears relatively small and therefore looks amendable to an open repair technique. The risk of hernia repair include bleeding, infection, organ injury, bowel injury, bladder injury, nerve injury recurrent hernia, blood clots, worsening of underlying condition, chronic pain, mesh use, open surgery, death, and the need for other operattions. Pt agrees to proceed  Current Plans You are being scheduled for surgery - Our schedulers will call you.  You should hear from our office's scheduling department within 5 working days about the location, date, and time of surgery. We try to make accommodations for patient's preferences in scheduling surgery, but sometimes the OR schedule or the surgeon's schedule prevents [place] from making those accommodations.  If you have not heard from our office [phone]) in 5 working days, call the office and ask for your surgeon's nurse.  If you have other questions about your diagnosis, plan, or surgery, call the office and ask for your surgeon's nurse.  The anatomy & physiology of the abdominal wall was discussed. The  pathophysiology of hernias was discussed. Natural history  risks without surgery including progeressive enlargement, pain, incarceration, & strangulation was discussed. Contributors to complications such as smoking, obesity, diabetes, prior surgery, etc were discussed.  I feel the risks of no intervention will lead to serious problems that outweigh the operative risks; therefore, I recommended surgery to reduce and repair the hernia. I explained laparoscopic techniques with possible need for an open approach. I noted the probable use of mesh to patch and/or buttress the hernia repair  Risks such as bleeding, infection, abscess, need for further treatment, heart attack, death, and other risks were discussed. I noted a good likelihood this will help address the problem. Goals of post-operative recovery were discussed as well. Possibility that this will not correct all symptoms was explained. I stressed the importance of low-impact activity, aggressive pain control, avoiding constipation, & not pushing through pain to minimize risk of post-operative chronic pain or injury. Possibility of reherniation especially with smoking, obesity, diabetes, immunosuppression, and other health conditions was discussed. We will work to minimize complications.  An educational handout further explaining the pathology & treatment options was given as well. Questions were answered. The patient expresses understanding & wishes to proceed with surgery.  Pt Education - Pamphlet Given - Hernia Surgery: discussed with patient and provided information. PERSONAL HISTORY OF COLON CANCER, STAGE II (Z85.038)

## 2016-01-18 ENCOUNTER — Encounter (HOSPITAL_BASED_OUTPATIENT_CLINIC_OR_DEPARTMENT_OTHER): Payer: Self-pay | Admitting: Surgery

## 2016-01-18 DIAGNOSIS — K432 Incisional hernia without obstruction or gangrene: Secondary | ICD-10-CM | POA: Diagnosis not present

## 2016-01-18 DIAGNOSIS — E119 Type 2 diabetes mellitus without complications: Secondary | ICD-10-CM | POA: Diagnosis not present

## 2016-01-18 DIAGNOSIS — I10 Essential (primary) hypertension: Secondary | ICD-10-CM | POA: Diagnosis not present

## 2016-01-18 LAB — GLUCOSE, CAPILLARY: GLUCOSE-CAPILLARY: 100 mg/dL — AB (ref 65–99)

## 2016-01-18 MED ORDER — KETOROLAC TROMETHAMINE 30 MG/ML IJ SOLN
INTRAMUSCULAR | Status: AC
  Filled 2016-01-18: qty 1

## 2016-01-18 MED ORDER — ALUM & MAG HYDROXIDE-SIMETH 200-200-20 MG/5ML PO SUSP
30.0000 mL | Freq: Once | ORAL | Status: AC
  Administered 2016-01-18: 30 mL via ORAL
  Filled 2016-01-18: qty 30

## 2016-01-18 MED ORDER — OXYCODONE HCL 5 MG PO TABS
ORAL_TABLET | ORAL | Status: AC
  Filled 2016-01-18: qty 2

## 2016-01-18 NOTE — Progress Notes (Signed)
1 Day Post-Op  Subjective: Pt complained of chest discomfort, jaw and arm pain this am EKG done and appeared  Normal. Maloxx given and binder loosened Symptoms improved with this Binder now off Feels better and chest discomfort resolved   Objective: Vital signs in last 24 hours: Temp:  [97 F (36.1 C)-98.9 F (37.2 C)] 97.8 F (36.6 C) (10/24 0512) Pulse Rate:  [66-99] 73 (10/24 0715) Resp:  [14-26] 16 (10/24 0555) BP: (105-144)/(52-82) 105/65 (10/24 0555) SpO2:  [93 %-100 %] 99 % (10/24 0715) Weight:  [108 kg (238 lb)] 108 kg (238 lb) (10/23 0924)    Intake/Output from previous day: 10/23 0701 - 10/24 0700 In: 3256 [P.O.:1226; I.V.:2030] Out: 63.5 [Drains:52.5; Blood:11] Intake/Output this shift: No intake/output data recorded.  Resp: clear to auscultation bilaterally Cardio: regular rate and rhythm, S1, S2 normal, no murmur, click, rub or gallop Incision/Wound:incision CDI JP serous   Lab Results:  No results for input(s): WBC, HGB, HCT, PLT in the last 72 hours. BMET No results for input(s): NA, K, CL, CO2, GLUCOSE, BUN, CREATININE, CALCIUM in the last 72 hours. PT/INR No results for input(s): LABPROT, INR in the last 72 hours. ABG No results for input(s): PHART, HCO3 in the last 72 hours.  Invalid input(s): PCO2, PO2  Studies/Results: No results found.  Anti-infectives: Anti-infectives    Start     Dose/Rate Route Frequency Ordered Stop   01/17/16 0917  ceFAZolin (ANCEF) 3 g in dextrose 5 % 50 mL IVPB     3 g 130 mL/hr over 30 Minutes Intravenous On call to O.R. 01/17/16 0917 01/17/16 1130      Assessment/Plan: s/p Procedure(s): INCISIONAL HERNIA REPAIR (N/A) INSERTION OF MESH (N/A) Discharge home Symptoms secondary to tight binder May need to leave off while in bed or wear 2 together  Pt will call to follow up next week for drain   LOS: 0 days    Lita Flynn A. 01/18/2016

## 2016-02-14 ENCOUNTER — Ambulatory Visit: Payer: Self-pay | Admitting: Surgery

## 2016-02-14 NOTE — H&P (Signed)
Krystal Campbell 02/14/2016 2:20 PM Location: [facility] Patient #: [id number] DOB: 08-01-1960 Married / Language: Krystal Campbell / Race: White Female  History of Present Illness [name] A. Ranveer Wahlstrom MD; 02/14/2016 2:53 PM) Patient words: Patient returns for 2 month follow-up after repair of incisional hernia. She is doing well. Her Port-A-Cath is in place and she is done with chemotherapy. She desires removal of Port-A-Cath. She otherwise has no other complaints except soreness of her midline laparotomy incision.  The patient is a 55 year old female.   Allergies Bary Castilla [place], [place]; 02/14/2016 2:21 PM) No Known Drug Allergies 11/17/2014  Medication History [name], [place]; 02/14/2016 2:25 PM) Gabapentin (800MG  Tablet, Oral three times daily) Active. MetFORMIN HCl (1000MG  Tablet, Oral two times daily) Active. Citalopram Hydrobromide (20MG  Tablet, Oral daily) Active. Fiber (625MG  Tablet, Oral two times daily) Active. Aspirin EC Low Dose (81MG  Tablet DR, Oral daily) Active. Tricor (145MG  Tablet, Oral daily) Active. Melatonin (10MG  Capsule, Oral daily) Active. (Take two capsules at bedtime.) Oxycodone-Acetaminophen (5-325MG  Tablet, Oral as needed) Active. Protonix (20MG  Tablet DR, Oral two times daily) Active. Vitamin B12 (1000MCG Tablet ER, Oral daily) Active. Medications Reconciled    Vitals ([name] CMA; 02/14/2016 2:25 PM) 02/14/2016 2:25 PM Weight: 243.2 lb Height: 64in Body Surface Area: 2.13 m Body Mass Index: 41.74 kg/m  Temp.: 98.88F  Pulse: 82 (Regular)  BP: 132/82 (Sitting, Left Arm, Standard)      Physical Exam ([name] A. [name] MD; 02/14/2016 2:54 PM)  General Mental Status-Alert. General Appearance-Consistent with stated age. Hydration-Well hydrated. Voice-Normal.  Chest and Lung Exam Note: Right subclavian Port-A-Cath site clean dry and intact  Abdomen Note: Soft nontender incision well-healed. No  evidence of hernia recurrence.  Musculoskeletal Normal Exam - Left-Upper Extremity Strength Normal and Lower Extremity Strength Normal. Normal Exam - Right-Upper Extremity Strength Normal, Lower Extremity Weakness.    Assessment & Plan ([name] A. Ja Ohman MD; 02/14/2016 2:54 PM)  PERSONAL HISTORY OF COLON CANCER, STAGE II (Z85.038) Impression: Stable to point time. Recent colonoscopy shows no evidence of recurrent disease.  POST-OPERATIVE STATE (Z98.890)  PORT CATHETER IN PLACE [phone]) Impression: set up port removal. Risk of bleeding, infection, injury to nearby structures, catheter migration, catheter fragmentation, and any further surgeries and/or procedures discussed.  Current Plans Pt Education - [facility] Free Text Education/Instructions: discussed with patient and provided information.

## 2016-03-01 ENCOUNTER — Encounter (HOSPITAL_BASED_OUTPATIENT_CLINIC_OR_DEPARTMENT_OTHER): Payer: Self-pay | Admitting: *Deleted

## 2016-03-07 ENCOUNTER — Ambulatory Visit (HOSPITAL_BASED_OUTPATIENT_CLINIC_OR_DEPARTMENT_OTHER): Payer: BLUE CROSS/BLUE SHIELD | Admitting: Anesthesiology

## 2016-03-07 ENCOUNTER — Ambulatory Visit (HOSPITAL_BASED_OUTPATIENT_CLINIC_OR_DEPARTMENT_OTHER)
Admission: RE | Admit: 2016-03-07 | Discharge: 2016-03-07 | Disposition: A | Discharge status: Home / Self Care | Payer: BLUE CROSS/BLUE SHIELD | Source: Ambulatory Visit | Attending: Surgery | Admitting: Surgery

## 2016-03-07 ENCOUNTER — Encounter (HOSPITAL_BASED_OUTPATIENT_CLINIC_OR_DEPARTMENT_OTHER): Payer: Self-pay | Admitting: Anesthesiology

## 2016-03-07 ENCOUNTER — Encounter (HOSPITAL_BASED_OUTPATIENT_CLINIC_OR_DEPARTMENT_OTHER)
Admission: RE | Disposition: A | Discharge status: Home / Self Care | Payer: Self-pay | Source: Ambulatory Visit | Attending: Surgery

## 2016-03-07 DIAGNOSIS — Z79899 Other long term (current) drug therapy: Secondary | ICD-10-CM | POA: Insufficient documentation

## 2016-03-07 DIAGNOSIS — E119 Type 2 diabetes mellitus without complications: Secondary | ICD-10-CM | POA: Insufficient documentation

## 2016-03-07 DIAGNOSIS — Z9221 Personal history of antineoplastic chemotherapy: Secondary | ICD-10-CM | POA: Insufficient documentation

## 2016-03-07 DIAGNOSIS — Z85038 Personal history of other malignant neoplasm of large intestine: Secondary | ICD-10-CM | POA: Diagnosis not present

## 2016-03-07 DIAGNOSIS — Z7982 Long term (current) use of aspirin: Secondary | ICD-10-CM | POA: Diagnosis not present

## 2016-03-07 DIAGNOSIS — Z452 Encounter for adjustment and management of vascular access device: Secondary | ICD-10-CM | POA: Diagnosis present

## 2016-03-07 HISTORY — PX: PORT-A-CATH REMOVAL: SHX5289

## 2016-03-07 LAB — POCT I-STAT, CHEM 8
BUN: 21 mg/dL — ABNORMAL HIGH (ref 6–20)
Calcium, Ion: 1.11 mmol/L — ABNORMAL LOW (ref 1.15–1.40)
Chloride: 105 mmol/L (ref 101–111)
Creatinine, Ser: 1 mg/dL (ref 0.44–1.00)
Glucose, Bld: 135 mg/dL — ABNORMAL HIGH (ref 65–99)
HEMATOCRIT: 38 % (ref 36.0–46.0)
HEMOGLOBIN: 12.9 g/dL (ref 12.0–15.0)
Potassium: 4.1 mmol/L (ref 3.5–5.1)
SODIUM: 138 mmol/L (ref 135–145)
TCO2: 24 mmol/L (ref 0–100)

## 2016-03-07 LAB — GLUCOSE, CAPILLARY: Glucose-Capillary: 125 mg/dL — ABNORMAL HIGH (ref 65–99)

## 2016-03-07 SURGERY — REMOVAL PORT-A-CATH
Anesthesia: Monitor Anesthesia Care | Site: Chest | Laterality: Right

## 2016-03-07 MED ORDER — BUPIVACAINE-EPINEPHRINE (PF) 0.25% -1:200000 IJ SOLN
INTRAMUSCULAR | Status: AC
  Filled 2016-03-07: qty 30

## 2016-03-07 MED ORDER — MIDAZOLAM HCL 2 MG/2ML IJ SOLN
INTRAMUSCULAR | Status: AC
  Filled 2016-03-07: qty 2

## 2016-03-07 MED ORDER — CHLORHEXIDINE GLUCONATE CLOTH 2 % EX PADS: 6.0000 | MEDICATED_PAD | Freq: Once | CUTANEOUS | Status: DC

## 2016-03-07 MED ORDER — MIDAZOLAM HCL 2 MG/2ML IJ SOLN
1.0000 mg | INTRAMUSCULAR | Status: DC | PRN
  Administered 2016-03-07: 2 mg via INTRAVENOUS

## 2016-03-07 MED ORDER — PROPOFOL 500 MG/50ML IV EMUL
INTRAVENOUS | Status: DC | PRN
  Administered 2016-03-07: 25 ug/kg/min via INTRAVENOUS

## 2016-03-07 MED ORDER — FENTANYL CITRATE (PF) 100 MCG/2ML IJ SOLN
25.0000 ug | INTRAMUSCULAR | Status: DC | PRN
  Administered 2016-03-07 (×2): 50 ug via INTRAVENOUS

## 2016-03-07 MED ORDER — DEXAMETHASONE SODIUM PHOSPHATE 10 MG/ML IJ SOLN
INTRAMUSCULAR | Status: AC
  Filled 2016-03-07: qty 1

## 2016-03-07 MED ORDER — FENTANYL CITRATE (PF) 100 MCG/2ML IJ SOLN
INTRAMUSCULAR | Status: AC
  Filled 2016-03-07: qty 2

## 2016-03-07 MED ORDER — LACTATED RINGERS IV SOLN
INTRAVENOUS | Status: DC
  Administered 2016-03-07: 11:00:00 via INTRAVENOUS

## 2016-03-07 MED ORDER — LIDOCAINE 2% (20 MG/ML) 5 ML SYRINGE
INTRAMUSCULAR | Status: AC
  Filled 2016-03-07: qty 5

## 2016-03-07 MED ORDER — ONDANSETRON HCL 4 MG/2ML IJ SOLN
INTRAMUSCULAR | Status: DC | PRN
  Administered 2016-03-07: 4 mg via INTRAVENOUS

## 2016-03-07 MED ORDER — OXYCODONE HCL 5 MG/5ML PO SOLN: 5.0000 mg | Freq: Once | ORAL | Status: DC | PRN

## 2016-03-07 MED ORDER — DEXAMETHASONE SODIUM PHOSPHATE 10 MG/ML IJ SOLN
INTRAMUSCULAR | Status: DC | PRN
  Administered 2016-03-07: 5 mg via INTRAVENOUS

## 2016-03-07 MED ORDER — BUPIVACAINE-EPINEPHRINE 0.25% -1:200000 IJ SOLN
INTRAMUSCULAR | Status: DC | PRN
  Administered 2016-03-07: 10 mL

## 2016-03-07 MED ORDER — FENTANYL CITRATE (PF) 100 MCG/2ML IJ SOLN
50.0000 ug | INTRAMUSCULAR | Status: DC | PRN
  Administered 2016-03-07: 100 ug via INTRAVENOUS

## 2016-03-07 MED ORDER — ONDANSETRON HCL 4 MG/2ML IJ SOLN: 4.0000 mg | Freq: Once | INTRAMUSCULAR | Status: DC | PRN

## 2016-03-07 MED ORDER — OXYCODONE HCL 5 MG PO TABS: 5.0000 mg | ORAL_TABLET | Freq: Once | ORAL | Status: DC | PRN

## 2016-03-07 MED ORDER — PROPOFOL 500 MG/50ML IV EMUL
INTRAVENOUS | Status: AC
  Filled 2016-03-07: qty 50

## 2016-03-07 MED ORDER — CEFAZOLIN SODIUM-DEXTROSE 2-4 GM/100ML-% IV SOLN
2.0000 g | INTRAVENOUS | Status: AC
  Administered 2016-03-07: 2 g via INTRAVENOUS

## 2016-03-07 MED ORDER — LIDOCAINE HCL (CARDIAC) 20 MG/ML IV SOLN
INTRAVENOUS | Status: DC | PRN
  Administered 2016-03-07: 50 mg via INTRAVENOUS

## 2016-03-07 MED ORDER — SCOPOLAMINE 1 MG/3DAYS TD PT72: 1.0000 | MEDICATED_PATCH | Freq: Once | TRANSDERMAL | Status: DC | PRN

## 2016-03-07 MED ORDER — ONDANSETRON HCL 4 MG/2ML IJ SOLN
INTRAMUSCULAR | Status: AC
  Filled 2016-03-07: qty 2

## 2016-03-07 MED ORDER — CEFAZOLIN SODIUM-DEXTROSE 2-4 GM/100ML-% IV SOLN
INTRAVENOUS | Status: AC
  Filled 2016-03-07: qty 100

## 2016-03-07 SURGICAL SUPPLY — 34 items
BENZOIN TINCTURE PRP APPL 2/3 (GAUZE/BANDAGES/DRESSINGS) IMPLANT
BLADE SURG 15 STRL LF DISP TIS (BLADE) ×1 IMPLANT
BLADE SURG 15 STRL SS (BLADE) ×2
CHLORAPREP W/TINT 26ML (MISCELLANEOUS) ×3 IMPLANT
CLOSURE WOUND 1/2 X4 (GAUZE/BANDAGES/DRESSINGS)
COVER BACK TABLE 60X90IN (DRAPES) ×3 IMPLANT
COVER MAYO STAND STRL (DRAPES) ×3 IMPLANT
DECANTER SPIKE VIAL GLASS SM (MISCELLANEOUS) ×3 IMPLANT
DERMABOND ADVANCED (GAUZE/BANDAGES/DRESSINGS) ×2
DERMABOND ADVANCED .7 DNX12 (GAUZE/BANDAGES/DRESSINGS) ×1 IMPLANT
DRAPE LAPAROTOMY 100X72 PEDS (DRAPES) ×3 IMPLANT
DRAPE UTILITY XL STRL (DRAPES) ×3 IMPLANT
ELECT REM PT RETURN 9FT ADLT (ELECTROSURGICAL)
ELECTRODE REM PT RTRN 9FT ADLT (ELECTROSURGICAL) IMPLANT
GLOVE BIOGEL PI IND STRL 7.0 (GLOVE) ×2 IMPLANT
GLOVE BIOGEL PI IND STRL 8 (GLOVE) ×1 IMPLANT
GLOVE BIOGEL PI INDICATOR 7.0 (GLOVE) ×4
GLOVE BIOGEL PI INDICATOR 8 (GLOVE) ×2
GLOVE ECLIPSE 6.5 STRL STRAW (GLOVE) ×3 IMPLANT
GLOVE ECLIPSE 8.0 STRL XLNG CF (GLOVE) ×3 IMPLANT
GOWN STRL REUS W/ TWL LRG LVL3 (GOWN DISPOSABLE) ×2 IMPLANT
GOWN STRL REUS W/TWL LRG LVL3 (GOWN DISPOSABLE) ×4
NEEDLE HYPO 25X1 1.5 SAFETY (NEEDLE) ×3 IMPLANT
NS IRRIG 1000ML POUR BTL (IV SOLUTION) IMPLANT
PACK BASIN DAY SURGERY FS (CUSTOM PROCEDURE TRAY) ×3 IMPLANT
PENCIL BUTTON HOLSTER BLD 10FT (ELECTRODE) IMPLANT
SLEEVE SCD COMPRESS KNEE MED (MISCELLANEOUS) IMPLANT
SPONGE LAP 4X18 X RAY DECT (DISPOSABLE) IMPLANT
STRIP CLOSURE SKIN 1/2X4 (GAUZE/BANDAGES/DRESSINGS) IMPLANT
SUT MON AB 4-0 PC3 18 (SUTURE) ×3 IMPLANT
SUT VICRYL 3-0 CR8 SH (SUTURE) ×3 IMPLANT
SYR CONTROL 10ML LL (SYRINGE) ×3 IMPLANT
TOWEL OR 17X24 6PK STRL BLUE (TOWEL DISPOSABLE) ×3 IMPLANT
TOWEL OR NON WOVEN STRL DISP B (DISPOSABLE) IMPLANT

## 2016-03-07 NOTE — Op Note (Signed)
Preop diagnosis: Indwelling port a catheter for chemotherapy  Postop diagnosis: Same  Procedure: Removal of port a catheter right   Surgeon: Erroll Luna M.D.  Anesthesia: MAC with local  EBL: Minimal  Specimen none  Drains: None  Indications for procedure: The patient presents for removal of port a catheter after completing chemotherapy. The patient no longer requires central venous access. Risks of bleeding, infection, catheter fragmentation, embolization, arrhythmias and damage to arteries, veins and nerves and possibly other mediastinal structures discussed. The patient agrees to proceed.  Description of procedure: The patient was seen in the holding area. Questions were answered. The patient agreed to proceed. The patient was taken to the operating room. The patient was placed supine. Anesthesia was initiated. The skin on the upper chest was prepped and draped in a sterile fashion. Timeout was done. The patient received preoperative antibiotics. Incision was made through the old port site and the hub of the Port-A-Cath was seen. The sutures were cut to release the port from the chest wall. The catheter was removed in its entirety without difficulty. The tract was closed with 3-0 Vicryl. 4 Monocryl was used to close the skin. All final counts were correct. The patient was taken to recovery in satisfactory condition.

## 2016-03-07 NOTE — Anesthesia Preprocedure Evaluation (Addendum)
Anesthesia Evaluation  Patient identified by MRN, date of birth, ID band Patient awake    Reviewed: Allergy & Precautions, NPO status , Patient's Chart, lab work & pertinent test results  Airway Mallampati: II  TM Distance: >3 FB Neck ROM: Full    Dental  (+) Teeth Intact, Dental Advisory Given   Pulmonary    breath sounds clear to auscultation       Cardiovascular  Rhythm:Regular Rate:Normal     Neuro/Psych    GI/Hepatic   Endo/Other  diabetes  Renal/GU      Musculoskeletal   Abdominal   Peds  Hematology   Anesthesia Other Findings   Reproductive/Obstetrics                            Anesthesia Physical Anesthesia Plan  ASA: III  Anesthesia Plan: MAC   Post-op Pain Management:    Induction:   Airway Management Planned: Natural Airway and Simple Face Mask  Additional Equipment:   Intra-op Plan:   Post-operative Plan:   Informed Consent: I have reviewed the patients History and Physical, chart, labs and discussed the procedure including the risks, benefits and alternatives for the proposed anesthesia with the patient or authorized representative who has indicated his/her understanding and acceptance.     Plan Discussed with:   Anesthesia Plan Comments:         Anesthesia Quick Evaluation

## 2016-03-07 NOTE — Anesthesia Postprocedure Evaluation (Signed)
Anesthesia Post Note  Patient: Krystal Campbell  Procedure(s) Performed: Procedure(s) (LRB): REMOVAL PORT-A-CATH (Right)  Patient location during evaluation: PACU Anesthesia Type: MAC Level of consciousness: awake and alert, awake and oriented Pain management: pain level controlled Vital Signs Assessment: post-procedure vital signs reviewed and stable Respiratory status: spontaneous breathing, nonlabored ventilation and respiratory function stable Cardiovascular status: blood pressure returned to baseline Anesthetic complications: no    Last Vitals:  Vitals:   03/07/16 1154 03/07/16 1254  BP:  134/72  Pulse: 77 70  Resp: (!) 9 16  Temp:  36.4 C    Last Pain:  Vitals:   03/07/16 1254  TempSrc: Oral  PainSc: 2                  Damali Broadfoot COKER

## 2016-03-07 NOTE — Discharge Instructions (Signed)
GENERAL SURGERY: POST OP INSTRUCTIONS ° °###################################################################### ° °EAT °Gradually transition to a high fiber diet with a fiber supplement over the next few weeks after discharge.  Start with a pureed / full liquid diet (see below) ° °WALK °Walk an hour a day.  Control your pain to do that.   ° °CONTROL PAIN °Control pain so that you can walk, sleep, tolerate sneezing/coughing, go up/down stairs. ° °HAVE A BOWEL MOVEMENT DAILY °Keep your bowels regular to avoid problems.  OK to try a laxative to override constipation.  OK to use an antidairrheal to slow down diarrhea.  Call if not better after 2 tries ° °CALL IF YOU HAVE PROBLEMS/CONCERNS °Call if you are still struggling despite following these instructions. °Call if you have concerns not answered by these instructions ° °###################################################################### ° ° ° °1. DIET: Follow a light bland diet the first 24 hours after arrival home, such as soup, liquids, crackers, etc.  Be sure to include lots of fluids daily.  Avoid fast food or heavy meals as your are more likely to get nauseated.   °2. Take your usually prescribed home medications unless otherwise directed. °3. PAIN CONTROL: °a. Pain is best controlled by a usual combination of three different methods TOGETHER: °i. Ice/Heat °ii. Over the counter pain medication °iii. Prescription pain medication °b. Most patients will experience some swelling and bruising around the incisions.  Ice packs or heating pads (30-60 minutes up to 6 times a day) will help. Use ice for the first few days to help decrease swelling and bruising, then switch to heat to help relax tight/sore spots and speed recovery.  Some people prefer to use ice alone, heat alone, alternating between ice & heat.  Experiment to what works for you.  Swelling and bruising can take several weeks to resolve.   °c. It is helpful to take an over-the-counter pain medication  regularly for the first few weeks.  Choose one of the following that works best for you: °i. Naproxen (Aleve, etc)  Two 220mg tabs twice a day °ii. Ibuprofen (Advil, etc) Three 200mg tabs four times a day (every meal & bedtime) °iii. Acetaminophen (Tylenol, etc) 500-650mg four times a day (every meal & bedtime) °d. A  prescription for pain medication (such as oxycodone, hydrocodone, etc) should be given to you upon discharge.  Take your pain medication as prescribed.  °i. If you are having problems/concerns with the prescription medicine (does not control pain, nausea, vomiting, rash, itching, etc), please call us [phone] to see if we need to switch you to a different pain medicine that will work better for you and/or control your side effect better. °ii. If you need a refill on your pain medication, please contact your pharmacy.  They will contact our office to request authorization. Prescriptions will not be filled after 5 pm or on week-ends. °4. Avoid getting constipated.  Between the surgery and the pain medications, it is common to experience some constipation.  Increasing fluid intake and taking a fiber supplement (such as Metamucil, Citrucel, FiberCon, MiraLax, etc) 1-2 times a day regularly will usually help prevent this problem from occurring.  A mild laxative (prune juice, Milk of Magnesia, MiraLax, etc) should be taken according to package directions if there are no bowel movements after 48 hours.   °5. Wash / shower every day.  You may shower over the dressings as they are waterproof.  Continue to shower over incision(s) after the dressing is off. °6. Remove your waterproof bandages   5 days after surgery.  You may leave the incision open to air.  You may have skin tapes (Steri Strips) covering the incision(s).  Leave them on until one week, then remove.  You may replace a dressing/Band-Aid to cover the incision for comfort if you wish.  ° ° ° ° °7. ACTIVITIES as tolerated:   °a. You may resume  regular (light) daily activities beginning the next day--such as daily self-care, walking, climbing stairs--gradually increasing activities as tolerated.  If you can walk 30 minutes without difficulty, it is safe to try more intense activity such as jogging, treadmill, bicycling, low-impact aerobics, swimming, etc. °b. Save the most intensive and strenuous activity for last such as sit-ups, heavy lifting, contact sports, etc  Refrain from any heavy lifting or straining until you are off narcotics for pain control.   °c. DO NOT PUSH THROUGH PAIN.  Let pain be your guide: If it hurts to do something, don't do it.  Pain is your body warning you to avoid that activity for another week until the pain goes down. °d. You may drive when you are no longer taking prescription pain medication, you can comfortably wear a seatbelt, and you can safely maneuver your car and apply brakes. °e. You may have sexual intercourse when it is comfortable.  °8. FOLLOW UP in our office °a. Please call [facility] at [phone] to set up an appointment to see your surgeon in the office for a follow-up appointment approximately 2-3 weeks after your surgery. °b. Make sure that you call for this appointment the day you arrive home to insure a convenient appointment time. °9. IF YOU HAVE DISABILITY OR FAMILY LEAVE FORMS, BRING THEM TO THE OFFICE FOR PROCESSING.  DO NOT GIVE THEM TO YOUR DOCTOR. ° ° °WHEN TO CALL US [phone]: °1. Poor pain control °2. Reactions / problems with new medications (rash/itching, nausea, etc)  °3. Fever over 101.5 F (38.5 C) °4. Worsening swelling or bruising °5. Continued bleeding from incision. °6. Increased pain, redness, or drainage from the incision °7. Difficulty breathing / swallowing ° ° The clinic staff is available to answer your questions during regular business hours (8:30am-5pm).  Please don’t hesitate to call and ask to speak to one of our nurses for clinical concerns.  ° If you have a medical emergency,  go to the nearest emergency room or call 911. ° A surgeon from [facility] is always on call at the hospitals ° ° °[facility] °[address], [address], [place], [place]  [address] ? °MAIN: [phone] ? TOLL FREE: [phone] ?  °FAX [phone] °[email or web address] ° ° °Post Anesthesia Home Care Instructions ° °Activity: °Get plenty of rest for the remainder of the day. A responsible adult should stay with you for 24 hours following the procedure.  °For the next 24 hours, DO NOT: °-Drive a car °-Operate machinery °-Drink alcoholic beverages °-Take any medication unless instructed by your physician °-Make any legal decisions or sign important papers. ° °Meals: °Start with liquid foods such as gelatin or soup. Progress to regular foods as tolerated. Avoid greasy, spicy, heavy foods. If nausea and/or vomiting occur, drink only clear liquids until the nausea and/or vomiting subsides. Call your physician if vomiting continues. ° °Special Instructions/Symptoms: °Your throat may feel dry or sore from the anesthesia or the breathing tube placed in your throat during surgery. If this causes discomfort, gargle with warm salt water. The discomfort should disappear within 24 hours. ° °  If you had a scopolamine patch placed behind your ear for the management of post- operative nausea and/or vomiting: ° °1. The medication in the patch is effective for 72 hours, after which it should be removed.  Wrap patch in a tissue and discard in the trash. Wash hands thoroughly with soap and water. °2. You may remove the patch earlier than 72 hours if you experience unpleasant side effects which may include dry mouth, dizziness or visual disturbances. °3. Avoid touching the patch. Wash your hands with soap and water after contact with the patch. °  ° °

## 2016-03-07 NOTE — Anesthesia Procedure Notes (Signed)
Procedure Name: MAC Date/Time: 03/07/2016 11:37 AM Performed by: Lieutenant Diego Pre-anesthesia Checklist: Patient identified, Timeout performed, Emergency Drugs available, Suction available and Patient being monitored Patient Re-evaluated:Patient Re-evaluated prior to inductionOxygen Delivery Method: Circle system utilized and Simple face mask

## 2016-03-07 NOTE — H&P (View-Only) (Signed)
Krystal Campbell 02/14/2016 2:20 PM Location: [facility] Patient #: [id number] DOB: 04/10/1960 Married / Language: [name] / Race: White Female  History of Present Illness [name] A. Jurline Folger MD; 02/14/2016 2:53 PM) Patient words: Patient returns for 2 month follow-up after repair of incisional hernia. She is doing well. Her Port-A-Cath is in place and she is done with chemotherapy. She desires removal of Port-A-Cath. She otherwise has no other complaints except soreness of her midline laparotomy incision.  The patient is a 55 year old female.   Allergies Bary Castilla [place], [place]; 02/14/2016 2:21 PM) No Known Drug Allergies 11/17/2014  Medication History [name], [place]; 02/14/2016 2:25 PM) Gabapentin (800MG  Tablet, Oral three times daily) Active. MetFORMIN HCl (1000MG  Tablet, Oral two times daily) Active. Citalopram Hydrobromide (20MG  Tablet, Oral daily) Active. Fiber (625MG  Tablet, Oral two times daily) Active. Aspirin EC Low Dose (81MG  Tablet DR, Oral daily) Active. Tricor (145MG  Tablet, Oral daily) Active. Melatonin (10MG  Capsule, Oral daily) Active. (Take two capsules at bedtime.) Oxycodone-Acetaminophen (5-325MG  Tablet, Oral as needed) Active. Protonix (20MG  Tablet DR, Oral two times daily) Active. Vitamin B12 (1000MCG Tablet ER, Oral daily) Active. Medications Reconciled    Vitals ([name] CMA; 02/14/2016 2:25 PM) 02/14/2016 2:25 PM Weight: 243.2 lb Height: 64in Body Surface Area: 2.13 m Body Mass Index: 41.74 kg/m  Temp.: 98.62F  Pulse: 82 (Regular)  BP: 132/82 (Sitting, Left Arm, Standard)      Physical Exam (Cornelious Bartolucci A. Cambre Matson MD; 02/14/2016 2:54 PM)  General Mental Status-Alert. General Appearance-Consistent with stated age. Hydration-Well hydrated. Voice-Normal.  Chest and Lung Exam Note: Right subclavian Port-A-Cath site clean dry and intact  Abdomen Note: Soft nontender incision well-healed. No  evidence of hernia recurrence.  Musculoskeletal Normal Exam - Left-Upper Extremity Strength Normal and Lower Extremity Strength Normal. Normal Exam - Right-Upper Extremity Strength Normal, Lower Extremity Weakness.    Assessment & Plan (Navi Ewton A. Aylissa Heinemann MD; 02/14/2016 2:54 PM)  PERSONAL HISTORY OF COLON CANCER, STAGE II (Z85.038) Impression: Stable to point time. Recent colonoscopy shows no evidence of recurrent disease.  POST-OPERATIVE STATE (Z98.890)  PORT CATHETER IN PLACE [phone]) Impression: set up port removal. Risk of bleeding, infection, injury to nearby structures, catheter migration, catheter fragmentation, and any further surgeries and/or procedures discussed.  Current Plans Pt Education - [facility] Free Text Education/Instructions: discussed with patient and provided information.

## 2016-03-07 NOTE — Transfer of Care (Signed)
Immediate Anesthesia Transfer of Care Note  Patient: Krystal Campbell  Procedure(s) Performed: Procedure(s): REMOVAL PORT-A-CATH (Right)  Patient Location: PACU  Anesthesia Type:MAC  Level of Consciousness: awake, alert , oriented and patient cooperative  Airway & Oxygen Therapy: Patient Spontanous Breathing and Patient connected to face mask oxygen  Post-op Assessment: Report given to RN, Post -op Vital signs reviewed and stable and Patient moving all extremities  Post vital signs: Reviewed and stable  Last Vitals:  Vitals:   03/07/16 1153 03/07/16 1154  BP:    Pulse: 79 77  Resp:  (!) 9  Temp:      Last Pain:  Vitals:   03/07/16 1108  TempSrc: Oral  PainSc: 7          Complications: No apparent anesthesia complications

## 2016-03-07 NOTE — Interval H&P Note (Signed)
History and Physical Interval Note:  03/07/2016 11:15 AM  Krystal Campbell  has presented today for surgery, with the diagnosis of Port a cath in place  The various methods of treatment have been discussed with the patient and family. After consideration of risks, benefits and other options for treatment, the patient has consented to  Procedure(s): REMOVAL PORT-A-CATH (N/A) as a surgical intervention .  The patient's history has been reviewed, patient examined, no change in status, stable for surgery.  I have reviewed the patient's chart and labs.  Questions were answered to the patient's satisfaction.     [name]

## 2016-03-08 ENCOUNTER — Encounter (HOSPITAL_BASED_OUTPATIENT_CLINIC_OR_DEPARTMENT_OTHER): Payer: Self-pay | Admitting: Surgery

## 2016-05-09 ENCOUNTER — Other Ambulatory Visit: Payer: Self-pay | Admitting: Surgery

## 2016-05-09 DIAGNOSIS — R109 Unspecified abdominal pain: Secondary | ICD-10-CM

## 2016-05-12 ENCOUNTER — Ambulatory Visit
Admission: RE | Admit: 2016-05-12 | Discharge: 2016-05-12 | Disposition: A | Discharge status: Home / Self Care | Payer: BLUE CROSS/BLUE SHIELD | Source: Ambulatory Visit | Attending: Surgery | Admitting: Surgery

## 2016-05-12 DIAGNOSIS — R109 Unspecified abdominal pain: Secondary | ICD-10-CM

## 2016-05-12 MED ORDER — IOPAMIDOL (ISOVUE-300) INJECTION 61%
125.0000 mL | Freq: Once | INTRAVENOUS | Status: AC | PRN
  Administered 2016-05-12: 125 mL via INTRAVENOUS

## 2016-05-23 ENCOUNTER — Other Ambulatory Visit (HOSPITAL_COMMUNITY): Payer: Self-pay | Admitting: Surgery

## 2016-05-23 DIAGNOSIS — S301XXA Contusion of abdominal wall, initial encounter: Secondary | ICD-10-CM

## 2016-06-01 ENCOUNTER — Other Ambulatory Visit: Payer: Self-pay | Admitting: Radiology

## 2016-06-02 ENCOUNTER — Ambulatory Visit (HOSPITAL_COMMUNITY)
Admission: RE | Admit: 2016-06-02 | Discharge: 2016-06-02 | Disposition: A | Discharge status: Home / Self Care | Payer: Medicare PPO | Source: Ambulatory Visit | Attending: Surgery | Admitting: Surgery

## 2016-06-02 DIAGNOSIS — T888XXA Other specified complications of surgical and medical care, not elsewhere classified, initial encounter: Secondary | ICD-10-CM | POA: Diagnosis present

## 2016-06-02 DIAGNOSIS — Z79899 Other long term (current) drug therapy: Secondary | ICD-10-CM | POA: Insufficient documentation

## 2016-06-02 DIAGNOSIS — S301XXA Contusion of abdominal wall, initial encounter: Secondary | ICD-10-CM

## 2016-06-02 DIAGNOSIS — Y838 Other surgical procedures as the cause of abnormal reaction of the patient, or of later complication, without mention of misadventure at the time of the procedure: Secondary | ICD-10-CM | POA: Diagnosis not present

## 2016-06-02 DIAGNOSIS — Z7984 Long term (current) use of oral hypoglycemic drugs: Secondary | ICD-10-CM | POA: Insufficient documentation

## 2016-06-02 DIAGNOSIS — T792XXA Traumatic secondary and recurrent hemorrhage and seroma, initial encounter: Secondary | ICD-10-CM | POA: Diagnosis not present

## 2016-06-02 DIAGNOSIS — Z7982 Long term (current) use of aspirin: Secondary | ICD-10-CM | POA: Insufficient documentation

## 2016-06-02 LAB — CBC
HCT: 38.5 % (ref 36.0–46.0)
Hemoglobin: 12.2 g/dL (ref 12.0–15.0)
MCH: 29.1 pg (ref 26.0–34.0)
MCHC: 31.7 g/dL (ref 30.0–36.0)
MCV: 91.9 fL (ref 78.0–100.0)
PLATELETS: 361 10*3/uL (ref 150–400)
RBC: 4.19 MIL/uL (ref 3.87–5.11)
RDW: 14.6 % (ref 11.5–15.5)
WBC: 6.3 10*3/uL (ref 4.0–10.5)

## 2016-06-02 LAB — GLUCOSE, CAPILLARY: Glucose-Capillary: 81 mg/dL (ref 65–99)

## 2016-06-02 LAB — PROTIME-INR
INR: 0.97
PROTHROMBIN TIME: 12.9 s (ref 11.4–15.2)

## 2016-06-02 MED ORDER — LIDOCAINE HCL (PF) 1 % IJ SOLN
INTRAMUSCULAR | Status: AC
  Filled 2016-06-02: qty 10

## 2016-06-02 MED ORDER — FENTANYL CITRATE (PF) 100 MCG/2ML IJ SOLN
INTRAMUSCULAR | Status: AC | PRN
  Administered 2016-06-02 (×2): 50 ug via INTRAVENOUS

## 2016-06-02 MED ORDER — FENTANYL CITRATE (PF) 100 MCG/2ML IJ SOLN
INTRAMUSCULAR | Status: AC
  Filled 2016-06-02: qty 4

## 2016-06-02 MED ORDER — MIDAZOLAM HCL 2 MG/2ML IJ SOLN
INTRAMUSCULAR | Status: AC
  Filled 2016-06-02: qty 4

## 2016-06-02 MED ORDER — SODIUM CHLORIDE 0.9 % IV SOLN
INTRAVENOUS | Status: DC
  Administered 2016-06-02: 12:00:00 via INTRAVENOUS

## 2016-06-02 MED ORDER — MIDAZOLAM HCL 2 MG/2ML IJ SOLN
INTRAMUSCULAR | Status: AC | PRN
  Administered 2016-06-02 (×2): 1 mg via INTRAVENOUS

## 2016-06-02 NOTE — Sedation Documentation (Signed)
Patient is resting comfortably. 

## 2016-06-02 NOTE — Discharge Instructions (Signed)
Surgical Drain Home Care °Surgical drains are used to remove extra fluid that normally builds up in a surgical wound after surgery. A surgical drain helps to heal a surgical wound. Different kinds of surgical drains include: °· Active drains. These drains use suction to pull drainage away from the surgical wound. Drainage flows through a tube to a container outside of the body. It is important to keep the bulb or the drainage container flat (compressed) at all times, except while you empty it. Flattening the bulb or container creates suction. The two most common types of active drains are bulb drains and Hemovac drains. °· Passive drains. These drains allow fluid to drain naturally, by gravity. Drainage flows through a tube to a bandage (dressing) or a container outside of the body. Passive drains do not need to be emptied. The most common type of passive drain is the Penrose drain. °A drain is placed during surgery. Immediately after surgery, drainage is usually bright red and a little thicker than water. The drainage may gradually turn yellow or pink and become thinner. It is likely that your health care provider will remove the drain when the drainage stops or when the amount decreases to 1-2 Tbsp (15-30 mL) during a 24-hour period. °How to care for your surgical drain °· Keep the skin around the drain dry and covered with a dressing at all times. °· Check your drain area every day for signs of infection. Check for: °¨ More redness, swelling, or pain. °¨ Pus or a bad smell. °¨ Cloudy drainage. °Follow instructions from your health care provider about how to take care of your drain and how to change your dressing. Change your dressing at least one time every day. Change it more often if needed to keep the dressing dry. Make sure you: °1. Gather your supplies, including: °¨ Tape. °¨ Germ-free cleaning solution (sterile saline). °¨ Split gauze drain sponge: 4 x 4 inches (10 x 10 cm). °¨ Gauze square: 4 x 4 inches  (10 x 10 cm). °2. Wash your hands with soap and water before you change your dressing. If soap and water are not available, use hand sanitizer. °3. Remove the old dressing. Avoid using scissors to do that. °4. Use sterile saline to clean your skin around the drain. °5. Place the tube through the slit in a drain sponge. Place the drain sponge so that it covers your wound. °6. Place the gauze square or another drain sponge on top of the drain sponge that is on the wound. Make sure the tube is between those layers. °7. Tape the dressing to your skin. °8. If you have an active bulb or Hemovac drain, tape the drainage tube to your skin 1-2 inches (2.5-5 cm) below the place where the tube enters your body. Taping keeps the tube from pulling on any stitches (sutures) that you have. °9. Wash your hands with soap and water. °10. Write down the color of your drainage and how often you change your dressing. °How to empty your active bulb or Hemovac drain °1. Make sure that you have a measuring cup that you can empty your drainage into. °2. Wash your hands with soap and water. If soap and water are not available, use hand sanitizer. °3. Gently move your fingers down the tube while squeezing very lightly. This is called stripping the tube. This clears any drainage, clots, or tissue from the tube. °¨ Do not pull on the tube. °¨ You may need to strip the tube   several times every day to keep the tube clear. °4. Open the bulb cap or the drain plug. Do not touch the inside of the cap or the bottom of the plug. °5. Empty all of the drainage into the measuring cup. °6. Compress the bulb or the container and replace the cap or the plug. To compress the bulb or the container, squeeze it firmly in the middle while you close the cap or plug the container. °7. Write down the amount of drainage that you have in each 24-hour period. If you have less than 2 Tbsp (30 mL) of drainage during 24 hours, contact your health care provider. °8. Flush  the drainage down the toilet. °9. Wash your hands with soap and water. °Contact a health care provider if: °· You have more redness, swelling, or pain around your drain area. °· The amount of drainage that you have is increasing instead of decreasing. °· You have pus or a bad smell coming from your drain area. °· You have a fever. °· You have drainage that is cloudy. °· There is a sudden stop or a sudden decrease in the amount of drainage that you have. °· Your tube falls out. °· Your active drain does not stay compressed after you empty it. °This information is not intended to replace advice given to you by your health care provider. Make sure you discuss any questions you have with your health care provider. °Document Released: 03/10/2000 Document Revised: 08/19/2015 Document Reviewed: 09/30/2014 °Elsevier Interactive Patient Education © 2017 [employer] ° °

## 2016-06-02 NOTE — Sedation Documentation (Signed)
JP bulb placed- bloody drainage- 850cc aspirated from abd

## 2016-06-02 NOTE — Procedures (Signed)
Interventional Radiology Procedure Note  Procedure: US guided placement of a 22F drain into the anterior abdominal wall fluid collection with aspiration of 870 mL serosanguinous fluid.   Complications: None  Estimated Blood Loss: None  Recommendations: - DC home - Record drain output - Drain clinic in 1-2 weeks   Signed,  Criselda Peaches, MD

## 2016-06-02 NOTE — H&P (Signed)
Chief Complaint: Patient was seen in consultation today for abdominal fluid collection  Referring Physician(s):  Dr. Erroll Luna  Supervising Physician: Jacqulynn Cadet  Patient Status: [facility] - Out-pt  History of Present Illness: Krystal Campbell is a 56 y.o. female with past medical history of colon cancer, DM2, HLD, GERD, and diverticulosis presents with abdominal pain s/p abdominal hernia repair in 12/2015.   CT Abd/Pelvis 05/12/16 1. Large anterior abdominal wall fluid collection, but no evidence of a residual or recurrent hernia. Fluid collection measures 20 cm in greatest dimension. This may reflect a sterile seroma. An infected collection is possible. Lack of adjacent fat inflammatory change supports a sterile collection. 2. No acute findings within the abdomen or pelvis. No evidence of bowel obstruction or bowel inflammation. 3. Mild diffuse hepatic steatosis. 4. Appearance of the kidney consistent with a previous partial nephrectomy, stable. 5. Multiple left colon diverticula.  No diverticulitis.  IR consulted for aspiration and drainage of large abdominal fluid collection as seen on CT scan 05/12/16.  Dr. Vernard Gambles reviewed case and feels patient is appropriate.   She has been NPO.  She does not take blood thinners.   Past Medical History:  Diagnosis Date  . Anemia   . Anxiety   . Arthritis    in knees  . Cancer ([place])    colon  . Carpal tunnel syndrome of right wrist   . Depression   . Diabetes mellitus without complication ([place])   . Diverticulosis   . GERD (gastroesophageal reflux disease)   . History of blood transfusion    10/09/14  . History of hiatal hernia   . Hyperlipidemia   . Incisional hernia   . Pneumonia   . Pulmonary embolism ([place])   . Wears glasses     Past Surgical History:  Procedure Laterality Date  . ABDOMINAL HYSTERECTOMY    . APPENDECTOMY    . COLONOSCOPY WITH PROPOFOL Left 10/30/2014   Procedure: COLONOSCOPY WITH PROPOFOL;   Surgeon: Ronald Lobo, MD;  Location: [place];  Service: Endoscopy;  Laterality: Left;  . DILATION AND CURETTAGE OF UTERUS    . ESOPHAGOGASTRODUODENOSCOPY (EGD) WITH PROPOFOL Left 10/30/2014   Procedure: ESOPHAGOGASTRODUODENOSCOPY (EGD) WITH PROPOFOL;  Surgeon: Ronald Lobo, MD;  Location: [facility] ENDOSCOPY;  Service: Endoscopy;  Laterality: Left;  . INCISIONAL HERNIA REPAIR N/A 01/17/2016   Procedure: INCISIONAL HERNIA REPAIR;  Surgeon: Erroll Luna, MD;  Location: [place];  Service: General;  Laterality: N/A;  . INSERTION OF MESH N/A 01/17/2016   Procedure: INSERTION OF MESH;  Surgeon: Erroll Luna, MD;  Location: [place];  Service: General;  Laterality: N/A;  . IVC FILTER PLACEMENT ([place] HX)    . JOINT REPLACEMENT  4/15   Total right knee replacement; left knee scoped  . KIDNEY SURGERY     when patient was a teenager  . LAPAROSCOPIC PARTIAL COLECTOMY N/A 11/04/2014   Procedure: LAPAROSCOPIC PARTIAL COLECTOMY POSSIBLE OPEN;  Surgeon: Erroll Luna, MD;  Location: [place];  Service: General;  Laterality: N/A;  . PORT-A-CATH REMOVAL Right 03/07/2016   Procedure: REMOVAL PORT-A-CATH;  Surgeon: Erroll Luna, MD;  Location: [place];  Service: General;  Laterality: Right;  . PORTACATH PLACEMENT Right 12/22/2014   Procedure: INSERTION PORT-A-CATH;  Surgeon: Erroll Luna, MD;  Location: [place];  Service: General;  Laterality: Right;  . REPLACEMENT TOTAL KNEE      Allergies: Patient has no known allergies.  Medications: Prior to Admission medications   Medication  Sig Start Date End Date Taking? Authorizing Provider  aspirin EC 81 MG tablet Take 81 mg by mouth daily.    Historical Provider, MD  Calcium Polycarbophil (FIBER) 625 MG TABS Take by mouth.    Historical Provider, MD  citalopram (CELEXA) 20 MG tablet Take 20 mg by mouth 3 (three) times daily.    Historical Provider, MD  fenofibrate (TRICOR) 145 MG tablet Take 145 mg by mouth  daily.    Historical Provider, MD  gabapentin (NEURONTIN) 800 MG tablet Take 800 mg by mouth 3 (three) times daily.    Historical Provider, MD  Melatonin 10 MG CAPS Take 10 mg by mouth at bedtime.    Historical Provider, MD  metFORMIN (GLUCOPHAGE) 1000 MG tablet Take 1,000 mg by mouth 2 (two) times daily.    Historical Provider, MD  oxyCODONE-acetaminophen (ROXICET) 5-325 MG tablet Take 1-2 tablets by mouth every 4 (four) hours as needed. 01/17/16   Erroll Luna, MD  pantoprazole (PROTONIX) 40 MG tablet Take 1 tablet (40 mg total) by mouth 2 (two) times daily. 10/11/14   Kathie Dike, MD  vitamin B-12 1000 MCG tablet Take 1 tablet (1,000 mcg total) by mouth daily. 11/10/14   Nishant Dhungel, MD     Family History  Problem Relation Age of Onset  . Healthy Mother     no clotting issues in either parents  . Healthy Father   . Cancer Other     Social History   Social History  . Marital status: Married    Spouse name: N/A  . Number of children: N/A  . Years of education: N/A   Social History Main Topics  . Smoking status: Never Smoker  . Smokeless tobacco: Never Used  . Alcohol use No     Comment: rarely  . Drug use: Yes    Types: Marijuana     Comment: last smoked 2 day ago  . Sexual activity: Not on file   Other Topics Concern  . Not on file   Social History Narrative  . No narrative on file    Review of Systems  Constitutional: Negative for fatigue and fever.  Respiratory: Negative for cough and shortness of breath.   Cardiovascular: Negative for chest pain.  Gastrointestinal: Positive for abdominal pain.  Psychiatric/Behavioral: Negative for behavioral problems and confusion.    Vital Signs: BP (!) 160/68   Pulse 84   Temp 98.2 F (36.8 C) (Oral)   Ht 5\' 4"  (1.626 m)   Wt 260 lb (117.9 kg)   SpO2 100%   BMI 44.63 kg/m   Physical Exam  Mallampati Score:  MD Evaluation Airway: WNL Heart: WNL Abdomen: WNL Chest/ Lungs: WNL ASA  Classification:  3 Mallampati/Airway Score: One  Imaging: Ct Abdomen Pelvis W Contrast  Result Date: 05/12/2016 CLINICAL DATA:  Abdominal Pain, S/P Umbilical SX 46/2703, wt gain > 15lbs X 2 moDifficulty eating.HX Colon CA Stage 3 X 2016, SX, ChemoHX DM, HTN EXAM: CT ABDOMEN AND PELVIS WITH CONTRAST TECHNIQUE: Multidetector CT imaging of the abdomen and pelvis was performed using the standard protocol following bolus administration of intravenous contrast. Creatinine was obtained on site at [place] at [address] Results: Creatinine 1.0 mg/dL.  GFR 63. CONTRAST:  1101mL ISOVUE-300 IOPAMIDOL (ISOVUE-300) INJECTION 61% COMPARISON:  11/09/2015 FINDINGS: Lower chest: No acute abnormality. Hepatobiliary: Decreased attenuation of the liver consistent with fatty infiltration. No liver mass or focal lesion. Normal gallbladder. No bile duct dilation. Pancreas: Unremarkable. No pancreatic ductal dilatation or  surrounding inflammatory changes. Spleen: Multiple splenic calcifications consistent with healed granuloma. Spleen otherwise unremarkable. Adrenals/Urinary Tract: No adrenal masses. Evidence consistent with previous partial right nephrectomy with adjacent surgical vascular clips, stable from the prior study. 19 mm lower pole right renal cyst. Small renal sinus cysts on the left. No other renal masses. No stones. No hydronephrosis. Ureters normal in course and in caliber. Bladder is unremarkable. Stomach/Bowel: Status post right hemicolectomy with formation of an ileal how like anastomosis. Bowel is normal in caliber. No wall thickening or mesenteric inflammation. There are multiple left colon diverticula. No diverticulitis. Stomach is unremarkable. Vascular/Lymphatic: No significant vascular findings are present. No enlarged abdominal or pelvic lymph nodes. Reproductive: Uterus and bilateral adnexa are unremarkable other than bilateral tubal ligation clips. Other: Since the prior study the anterior abdominal wall  hernia has been repaired. There is now a large fluid collection in the anterior abdominal wall, abutting the fascia overlying the anterior abdominal wall musculature. The collection measures 20 x 7.7 x 11.3 cm. There is no evidence of a recurrent or residual hernia. No ascites. Musculoskeletal: No fracture or acute finding. No osteoblastic or osteolytic lesions. IMPRESSION: 1. Large anterior abdominal wall fluid collection, but no evidence of a residual or recurrent hernia. Fluid collection measures 20 cm in greatest dimension. This may reflect a sterile seroma. An infected collection is possible. Lack of adjacent fat inflammatory change supports a sterile collection. 2. No acute findings within the abdomen or pelvis. No evidence of bowel obstruction or bowel inflammation. 3. Mild diffuse hepatic steatosis. 4. Appearance of the kidney consistent with a previous partial nephrectomy, stable. 5. Multiple left colon diverticula.  No diverticulitis. Electronically Signed   By: Lajean Manes M.D.   On: 05/12/2016 09:37    Labs:  CBC:  Recent Labs  08/05/15 0930 01/13/16 1015 03/07/16 1119  WBC 6.3 5.6  --   HGB 12.7 12.9 12.9  HCT 39.3 39.9 38.0  PLT 257 295  --     COAGS:  Recent Labs  08/05/15 0930  INR 1.05    BMP:  Recent Labs  08/05/15 0930 01/13/16 1015 03/07/16 1119  NA 140 138 138  K 4.2 4.3 4.1  CL 104 104 105  CO2 22 25  --   GLUCOSE 167* 113* 135*  BUN 11 13 21*  CALCIUM 9.7 9.2  --   CREATININE 0.89 1.10* 1.00  GFRNONAA >60 55*  --   GFRAA >60 >60  --     LIVER FUNCTION TESTS:  Recent Labs  01/13/16 1015  BILITOT 0.7  AST 27  ALT 22  ALKPHOS 43  PROT 6.8  ALBUMIN 3.7    TUMOR MARKERS: No results for input(s): AFPTM, CEA, CA199, CHROMGRNA in the last 8760 hours.  Assessment and Plan: Abdominal fluid collection, likely seroma, s/p abdominal hernia repair in October 2017. Patient with history of diverticulosis and colon cancer s/p hernia repair in  12/2015 presents with abdominal fluid collection identified by CT scan 05/12/16.   IR consulted for aspiration and possible drainage of fluid collection. Case reviewed by Dr. Vernard Gambles who feels patient is appropriate. Patient presents for procedure today.  She has been NPO.  She does not take blood thinners.  She has been in her usual state of health. Risks and benefits discussed with the patient including bleeding, infection, damage to adjacent structures, bowel perforation/fistula connection, and sepsis. All of the patient's questions were answered, patient is agreeable to proceed. Consent signed and in chart.  Thank you  for this interesting consult.  I greatly enjoyed meeting AMARACHI KOTZ and look forward to participating in their care.  A copy of this report was sent to the requesting provider on this date.  Electronically Signed: Docia Barrier 06/02/2016, 12:09 PM   I spent a total of  30 Minutes   in face to face in clinical consultation, greater than 50% of which was counseling/coordinating care for abdominal fluid collection.

## 2016-06-03 LAB — GRAM STAIN

## 2016-06-05 ENCOUNTER — Other Ambulatory Visit: Payer: Self-pay | Admitting: Surgery

## 2016-06-05 DIAGNOSIS — S301XXA Contusion of abdominal wall, initial encounter: Secondary | ICD-10-CM

## 2016-06-08 LAB — CULTURE, BODY FLUID-BOTTLE

## 2016-06-08 LAB — CULTURE, BODY FLUID W GRAM STAIN -BOTTLE: Culture: NO GROWTH

## 2016-06-13 ENCOUNTER — Ambulatory Visit
Admission: RE | Admit: 2016-06-13 | Discharge: 2016-06-13 | Disposition: A | Discharge status: Home / Self Care | Payer: Medicare PPO | Source: Ambulatory Visit | Attending: Surgery | Admitting: Surgery

## 2016-06-13 DIAGNOSIS — S301XXA Contusion of abdominal wall, initial encounter: Secondary | ICD-10-CM

## 2016-06-13 HISTORY — PX: IR RADIOLOGIST EVAL & MGMT: IMG5224

## 2016-06-13 NOTE — Progress Notes (Signed)
Referring Physician(s): Dr. Erroll Luna  Chief Complaint: The patient is seen in follow up today s/p percutaneous drain for abdominal seroma 06/02/2016  History of present illness: Krystal Campbell is a 56 y.o. female with past medical history of colon cancer, DM2, HLD, GERD, and diverticulosis presents with abdominal pain s/p abdominal hernia repair in 12/2015. CT Abd/Pelvis performed in 04/2016 shows a large anterior wall fluid collection thought to be a seroma.  Patient presented to the radiology deparment 06/02/16 for outpatient aspiration and drainage of the seroma.  870 ml were aspirated and cultured; results were negative for infection  Patient left the same day with drain in place. Patient presents to IR clinic today for follow-up imaging and assessment. She states she has continued to have 50-100 mL output daily from her drain.  Drainage is mostly serous, but occasionally bloody. She states she has some soreness related to drain, but denies fever, chills, inability to eat or drink, and is having regular bowel movements.   Past Medical History:  Diagnosis Date  . Anemia   . Anxiety   . Arthritis    in knees  . Cancer ([place])    colon  . Carpal tunnel syndrome of right wrist   . Depression   . Diabetes mellitus without complication ([place])   . Diverticulosis   . GERD (gastroesophageal reflux disease)   . History of blood transfusion    10/09/14  . History of hiatal hernia   . Hyperlipidemia   . Incisional hernia   . Pneumonia   . Pulmonary embolism ([place])   . Wears glasses     Past Surgical History:  Procedure Laterality Date  . ABDOMINAL HYSTERECTOMY    . APPENDECTOMY    . COLONOSCOPY WITH PROPOFOL Left 10/30/2014   Procedure: COLONOSCOPY WITH PROPOFOL;  Surgeon: Ronald Lobo, MD;  Location: [place];  Service: Endoscopy;  Laterality: Left;  . DILATION AND CURETTAGE OF UTERUS    . ESOPHAGOGASTRODUODENOSCOPY (EGD) WITH PROPOFOL Left 10/30/2014   Procedure:  ESOPHAGOGASTRODUODENOSCOPY (EGD) WITH PROPOFOL;  Surgeon: Ronald Lobo, MD;  [facility] ENDOSCOPY;  Service: Endoscopy;  Laterality: Left;  . INCISIONAL HERNIA REPAIR N/A 01/17/2016   Procedure: INCISIONAL HERNIA REPAIR;  Surgeon: Erroll Luna, MD;  Location: [place];  Service: General;  Laterality: N/A;  . INSERTION OF MESH N/A 01/17/2016   Procedure: INSERTION OF MESH;  Surgeon: Erroll Luna, MD;  Location: [place];  Service: General;  Laterality: N/A;  . IVC FILTER PLACEMENT ([place] HX)    . JOINT REPLACEMENT  4/15   Total right knee replacement; left knee scoped  . KIDNEY SURGERY     when patient was a teenager  . LAPAROSCOPIC PARTIAL COLECTOMY N/A 11/04/2014   Procedure: LAPAROSCOPIC PARTIAL COLECTOMY POSSIBLE OPEN;  Surgeon: Erroll Luna, MD;  Location: [place];  Service: General;  Laterality: N/A;  . PORT-A-CATH REMOVAL Right 03/07/2016   Procedure: REMOVAL PORT-A-CATH;  Surgeon: Erroll Luna, MD;  Location: [place];  Service: General;  Laterality: Right;  . PORTACATH PLACEMENT Right 12/22/2014   Procedure: INSERTION PORT-A-CATH;  Surgeon: Erroll Luna, MD;  Location: [place];  Service: General;  Laterality: Right;  . REPLACEMENT TOTAL KNEE      Allergies: Patient has no known allergies.  Medications: Prior to Admission medications   Medication Sig Start Date End Date Taking? Authorizing Provider  aspirin EC 81 MG tablet Take 81 mg by mouth daily.   Yes Historical Provider, MD  Calcium Polycarbophil (  FIBER) 625 MG TABS Take 625 mg by mouth 2 (two) times daily.    Yes Historical Provider, MD  citalopram (CELEXA) 20 MG tablet Take 20 mg by mouth 3 (three) times daily.   Yes Historical Provider, MD  fenofibrate (TRICOR) 145 MG tablet Take 145 mg by mouth daily.   Yes Historical Provider, MD  gabapentin (NEURONTIN) 800 MG tablet Take 800 mg by mouth 3 (three) times daily.   Yes Historical Provider, MD  Melatonin 10 MG CAPS  Take 10 mg by mouth at bedtime.   Yes Historical Provider, MD  metFORMIN (GLUCOPHAGE) 1000 MG tablet Take 1,000 mg by mouth 2 (two) times daily.   Yes Historical Provider, MD  pantoprazole (PROTONIX) 40 MG tablet Take 1 tablet (40 mg total) by mouth 2 (two) times daily. 10/11/14  Yes Kathie Dike, MD  vitamin B-12 1000 MCG tablet Take 1 tablet (1,000 mcg total) by mouth daily. 11/10/14  Yes Nishant Dhungel, MD  oxyCODONE-acetaminophen (ROXICET) 5-325 MG tablet Take 1-2 tablets by mouth every 4 (four) hours as needed. Patient not taking: Reported on 06/02/2016 01/17/16   Erroll Luna, MD     Family History  Problem Relation Age of Onset  . Healthy Mother     no clotting issues in either parents  . Healthy Father   . Cancer Other     Social History   Social History  . Marital status: Married    Spouse name: N/A  . Number of children: N/A  . Years of education: N/A   Social History Main Topics  . Smoking status: Never Smoker  . Smokeless tobacco: Never Used  . Alcohol use No     Comment: rarely  . Drug use: Yes    Types: Marijuana     Comment: last smoked 2 day ago  . Sexual activity: Not on file   Other Topics Concern  . Not on file   Social History Narrative  . No narrative on file     Vital Signs: BP 120/63 (BP Location: Left Arm, Patient Position: Sitting, Cuff Size: Large)   Pulse 74   Temp 98.7 F (37.1 C) (Oral)   Resp 16   Ht 5\' 4"  (1.626 m)   Wt 260 lb (117.9 kg)   SpO2 100%   BMI 44.63 kg/m   Physical Exam  NAD, alert Heart: regular rate and rhythm Lungs:  Clear to auscultation bilaterally Abdomen:  Midline drain in place.  Insertion site is c/d/I.  She is moderately tender to palpation particular to the left of the drain.  Imaging: No results found.  Labs:  CBC:  Recent Labs  08/05/15 0930 01/13/16 1015 03/07/16 1119 06/02/16 1215  WBC 6.3 5.6  --  6.3  HGB 12.7 12.9 12.9 12.2  HCT 39.3 39.9 38.0 38.5  PLT 257 295  --  361     COAGS:  Recent Labs  08/05/15 0930 06/02/16 1215  INR 1.05 0.97    BMP:  Recent Labs  08/05/15 0930 01/13/16 1015 03/07/16 1119  NA 140 138 138  K 4.2 4.3 4.1  CL 104 104 105  CO2 22 25  --   GLUCOSE 167* 113* 135*  BUN 11 13 21*  CALCIUM 9.7 9.2  --   CREATININE 0.89 1.10* 1.00  GFRNONAA >60 55*  --   GFRAA >60 >60  --     LIVER FUNCTION TESTS:  Recent Labs  01/13/16 1015  BILITOT 0.7  AST 27  ALT 22  ALKPHOS  43  PROT 6.8  ALBUMIN 3.7    Assessment: Abdominal seroma s/p hernia repair surgery in 10/217 now s/p anterior abdominal wall drain placement 06/02/16. Patient presents to clinic today for follow-up of her seroma and drain.  She continues to have small-to-moderate amounts of drainage (50-100 mLs) daily.  US imaging reviewed and shows near complete resolve of her seroma despite ongoing increased daily output. Discussed with Dr. Barbie Banner who conferred with Dr. Brantley Stage regarding continued drainage vs. Use of scerlosant.  It was decided that patient would be scheduled for injection and sclerosant at the hospital in the coming days.  Schedulers will contact patient with appointment time and date.  Patient was updated on plan and verbalizes understanding.   Signed: Docia Barrier 06/13/2016, 11:21 AM   Please refer to Dr. Barbie Banner attestation of this note for management and plan.

## 2016-06-14 ENCOUNTER — Other Ambulatory Visit: Payer: BLUE CROSS/BLUE SHIELD

## 2016-06-15 ENCOUNTER — Other Ambulatory Visit: Payer: BLUE CROSS/BLUE SHIELD

## 2016-06-15 ENCOUNTER — Other Ambulatory Visit (HOSPITAL_COMMUNITY): Payer: Self-pay | Admitting: Interventional Radiology

## 2016-06-15 DIAGNOSIS — S3011XA Contusion of abdominal wall, initial encounter: Secondary | ICD-10-CM

## 2016-06-15 DIAGNOSIS — S301XXA Contusion of abdominal wall, initial encounter: Secondary | ICD-10-CM

## 2016-06-16 ENCOUNTER — Other Ambulatory Visit: Payer: Self-pay | Admitting: General Surgery

## 2016-06-16 ENCOUNTER — Other Ambulatory Visit: Payer: Self-pay | Admitting: Physician Assistant

## 2016-06-19 ENCOUNTER — Ambulatory Visit (HOSPITAL_COMMUNITY)
Admission: RE | Admit: 2016-06-19 | Discharge: 2016-06-19 | Disposition: A | Discharge status: Home / Self Care | Payer: Medicare PPO | Source: Ambulatory Visit | Attending: Interventional Radiology | Admitting: Interventional Radiology

## 2016-06-19 ENCOUNTER — Other Ambulatory Visit (HOSPITAL_COMMUNITY): Payer: Self-pay | Admitting: Interventional Radiology

## 2016-06-19 ENCOUNTER — Encounter (HOSPITAL_COMMUNITY): Payer: Self-pay

## 2016-06-19 ENCOUNTER — Other Ambulatory Visit: Payer: Self-pay | Admitting: Surgery

## 2016-06-19 DIAGNOSIS — F419 Anxiety disorder, unspecified: Secondary | ICD-10-CM | POA: Insufficient documentation

## 2016-06-19 DIAGNOSIS — E785 Hyperlipidemia, unspecified: Secondary | ICD-10-CM | POA: Insufficient documentation

## 2016-06-19 DIAGNOSIS — Z85038 Personal history of other malignant neoplasm of large intestine: Secondary | ICD-10-CM | POA: Insufficient documentation

## 2016-06-19 DIAGNOSIS — Z86711 Personal history of pulmonary embolism: Secondary | ICD-10-CM | POA: Diagnosis not present

## 2016-06-19 DIAGNOSIS — S301XXA Contusion of abdominal wall, initial encounter: Secondary | ICD-10-CM

## 2016-06-19 DIAGNOSIS — F1290 Cannabis use, unspecified, uncomplicated: Secondary | ICD-10-CM | POA: Insufficient documentation

## 2016-06-19 DIAGNOSIS — E119 Type 2 diabetes mellitus without complications: Secondary | ICD-10-CM | POA: Insufficient documentation

## 2016-06-19 DIAGNOSIS — L7634 Postprocedural seroma of skin and subcutaneous tissue following other procedure: Secondary | ICD-10-CM | POA: Diagnosis not present

## 2016-06-19 DIAGNOSIS — Z7982 Long term (current) use of aspirin: Secondary | ICD-10-CM | POA: Insufficient documentation

## 2016-06-19 DIAGNOSIS — Z7984 Long term (current) use of oral hypoglycemic drugs: Secondary | ICD-10-CM | POA: Insufficient documentation

## 2016-06-19 DIAGNOSIS — Y838 Other surgical procedures as the cause of abnormal reaction of the patient, or of later complication, without mention of misadventure at the time of the procedure: Secondary | ICD-10-CM | POA: Diagnosis not present

## 2016-06-19 DIAGNOSIS — T888XXD Other specified complications of surgical and medical care, not elsewhere classified, subsequent encounter: Secondary | ICD-10-CM | POA: Insufficient documentation

## 2016-06-19 DIAGNOSIS — K219 Gastro-esophageal reflux disease without esophagitis: Secondary | ICD-10-CM | POA: Diagnosis not present

## 2016-06-19 DIAGNOSIS — F329 Major depressive disorder, single episode, unspecified: Secondary | ICD-10-CM | POA: Diagnosis not present

## 2016-06-19 HISTORY — PX: IR GENERIC HISTORICAL: IMG1180011

## 2016-06-19 LAB — GLUCOSE, CAPILLARY: GLUCOSE-CAPILLARY: 168 mg/dL — AB (ref 65–99)

## 2016-06-19 LAB — CBC
HCT: 34.6 % — ABNORMAL LOW (ref 36.0–46.0)
Hemoglobin: 10.9 g/dL — ABNORMAL LOW (ref 12.0–15.0)
MCH: 28.8 pg (ref 26.0–34.0)
MCHC: 31.5 g/dL (ref 30.0–36.0)
MCV: 91.3 fL (ref 78.0–100.0)
Platelets: 331 10*3/uL (ref 150–400)
RBC: 3.79 MIL/uL — ABNORMAL LOW (ref 3.87–5.11)
RDW: 14.7 % (ref 11.5–15.5)
WBC: 6.7 10*3/uL (ref 4.0–10.5)

## 2016-06-19 LAB — PROTIME-INR
INR: 0.96
PROTHROMBIN TIME: 12.7 s (ref 11.4–15.2)

## 2016-06-19 LAB — APTT: APTT: 29 s (ref 24–36)

## 2016-06-19 MED ORDER — SODIUM CHLORIDE 0.9 % IV SOLN: INTRAVENOUS | Status: DC

## 2016-06-19 MED ORDER — FENTANYL CITRATE (PF) 100 MCG/2ML IJ SOLN
INTRAMUSCULAR | Status: AC | PRN
  Administered 2016-06-19: 50 ug via INTRAVENOUS

## 2016-06-19 MED ORDER — MIDAZOLAM HCL 2 MG/2ML IJ SOLN
INTRAMUSCULAR | Status: AC | PRN
  Administered 2016-06-19: 1 mg via INTRAVENOUS

## 2016-06-19 MED ORDER — FENTANYL CITRATE (PF) 100 MCG/2ML IJ SOLN
INTRAMUSCULAR | Status: AC
  Filled 2016-06-19: qty 2

## 2016-06-19 MED ORDER — ALCOHOL 98 % IV SOLN
50.0000 mL | Freq: Once | INTRAVENOUS | Status: AC
  Administered 2016-06-19: 20 mL

## 2016-06-19 MED ORDER — MIDAZOLAM HCL 2 MG/2ML IJ SOLN
INTRAMUSCULAR | Status: AC
  Filled 2016-06-19: qty 2

## 2016-06-19 MED ORDER — IOPAMIDOL (ISOVUE-300) INJECTION 61%
INTRAVENOUS | Status: AC
  Administered 2016-06-19: 10 mL
  Filled 2016-06-19: qty 50

## 2016-06-19 NOTE — Sedation Documentation (Signed)
Patient is resting comfortably. 

## 2016-06-19 NOTE — Discharge Instructions (Addendum)
Change dressing daily, write down amt of drainage in drainage collector. Follow up with your Dr as needed.  Seroma A seroma is a collection of fluid on the body that looks like swelling or a mass. Seromas form where tissue has been injured or cut. Seromas vary in size. Some are small and painless. Others may become large and cause pain or discomfort. Many seromas go away on their own as the fluid is naturally absorbed by the body, and some seromas need to be drained. What are the causes? Seromas form as the result of damage to tissue or the removal of tissue. This tissue damage may occur during surgery or because of an injury or trauma. When tissue is disrupted or removed, empty space is created. The bodys natural defense system (immune system) causes fluid to enter the empty space and form a seroma. What are the signs or symptoms? Symptoms of this condition include:  Swelling at the site of a surgical cut (incision) or an injury.  Drainage of clear fluid at the surgery or injury site.  Discomfort or pain. How is this diagnosed? This condition is diagnosed based on your symptoms, your medical history, and a physical exam. During the exam, your health care provider will press on the seroma. You may also have tests, including:  Blood tests.  Imaging tests, such as an ultrasound or CT scan. How is this treated? Some seromas go away (resolve) on their own. Your health care provider may monitor you to make sure the seroma does not cause any complications. If your seroma does not resolve on its own, treatment may include:  Using a needle to drain the fluid from the seroma (needle aspiration).  Inserting a flexible tube (catheter) to drain the fluid.  Applying a bandage (dressing), such as an elastic bandage or binder.  Antibiotic medicines, if the seroma becomes infected. In rare cases, surgery may be done to remove the seroma and repair the area. Follow these instructions at home:  If you  were prescribed an antibiotic medicine, take it as told by your health care provider. Do not stop taking the antibiotic even if you start to feel better.  Return to your normal activities as told by your health care provider. Ask your health care provider what activities are safe for you.  Take over-the-counter and prescription medicines only as told by your health care provider.  Check your seroma every day for signs of infection. Check for:  Redness or pain.  Fluid or pus.  More swelling.  Warmth.  Keep all follow-up visits as told by your health care provider. This is important. Contact a health care provider if:  You have a fever.  You have redness or pain at the site of the seroma.  You have fluid or pus coming from the seroma.  Your seroma is more swollen or is getting bigger.  Your seroma is warm to the touch. This information is not intended to replace advice given to you by your health care provider. Make sure you discuss any questions you have with your health care provider. Document Released: 07/08/2012 Document Revised: 12/24/2015 Document Reviewed: 12/24/2015 Elsevier Interactive Patient Education  2017 [employer].

## 2016-06-19 NOTE — Procedures (Signed)
Alcohol sclero of abdominal wall seroma 20 cc ETOH for 30 minutes No comp/EBL

## 2016-06-19 NOTE — H&P (Signed)
Chief Complaint: Persistent seroma  Referring Physician(s): Dr. Erroll Luna  Supervising Physician: Marybelle Killings  Patient Status: [facility] - Out-pt  History of Present Illness: Krystal Campbell is a 56 y.o. female  past medical history of colon cancer, DM2, HLD, GERD, and diverticulosis presents with abdominal pain s/p abdominal hernia repair in 12/2015.  CT Abd/Pelvis performed in 04/2016 shows a large anterior wall fluid collection thought to be a seroma.    Patient presented to the radiology deparment 06/02/16 for outpatient aspiration and drainage of the seroma.  870 ml were aspirated and cultured; results were negative for infection    She left the same day with drain in place.  Patient presented to IR clinic today for follow-up imaging and assessment.   She stated she has continued to have 50-100 mL output daily from her drain and actually about 150 mL yesterday. Drainage is mostly serous.  She is here today for image guided sclerotherapy in hopes of resolving this seroma.  She is NPO.  Past Medical History:  Diagnosis Date  . Anemia   . Anxiety   . Arthritis    in knees  . Cancer ([place])    colon  . Carpal tunnel syndrome of right wrist   . Depression   . Diabetes mellitus without complication ([place])   . Diverticulosis   . GERD (gastroesophageal reflux disease)   . History of blood transfusion    10/09/14  . History of hiatal hernia   . Hyperlipidemia   . Incisional hernia   . Pneumonia   . Pulmonary embolism ([place])   . Wears glasses     Past Surgical History:  Procedure Laterality Date  . ABDOMINAL HYSTERECTOMY    . APPENDECTOMY    . COLONOSCOPY WITH PROPOFOL Left 10/30/2014   Procedure: COLONOSCOPY WITH PROPOFOL;  Surgeon: Ronald Lobo, MD;  Location: [place];  Service: Endoscopy;  Laterality: Left;  . DILATION AND CURETTAGE OF UTERUS    . ESOPHAGOGASTRODUODENOSCOPY (EGD) WITH PROPOFOL Left 10/30/2014   Procedure: ESOPHAGOGASTRODUODENOSCOPY (EGD) WITH  PROPOFOL;  Surgeon: Ronald Lobo, MD;  Location: [facility] ENDOSCOPY;  Service: Endoscopy;  Laterality: Left;  . INCISIONAL HERNIA REPAIR N/A 01/17/2016   Procedure: INCISIONAL HERNIA REPAIR;  Surgeon: Erroll Luna, MD;  Location: [place];  Service: General;  Laterality: N/A;  . INSERTION OF MESH N/A 01/17/2016   Procedure: INSERTION OF MESH;  Surgeon: Erroll Luna, MD;  Location: [place];  Service: General;  Laterality: N/A;  . IVC FILTER PLACEMENT ([place] HX)    . JOINT REPLACEMENT  4/15   Total right knee replacement; left knee scoped  . KIDNEY SURGERY     when patient was a teenager  . LAPAROSCOPIC PARTIAL COLECTOMY N/A 11/04/2014   Procedure: LAPAROSCOPIC PARTIAL COLECTOMY POSSIBLE OPEN;  Surgeon: Erroll Luna, MD;  Location: [place];  Service: General;  Laterality: N/A;  . PORT-A-CATH REMOVAL Right 03/07/2016   Procedure: REMOVAL PORT-A-CATH;  Surgeon: Erroll Luna, MD;  Location: [place];  Service: General;  Laterality: Right;  . PORTACATH PLACEMENT Right 12/22/2014   Procedure: INSERTION PORT-A-CATH;  Surgeon: Erroll Luna, MD;  Location: [place];  Service: General;  Laterality: Right;  . REPLACEMENT TOTAL KNEE      Allergies: Patient has no known allergies.  Medications: Prior to Admission medications   Medication Sig Start Date End Date Taking? Authorizing Provider  aspirin EC 81 MG tablet Take 81 mg by mouth daily.   Yes Historical Provider, MD  Calcium Polycarbophil (FIBER) 625 MG TABS Take 625 mg by mouth 2 (two) times daily.    Yes Historical Provider, MD  citalopram (CELEXA) 20 MG tablet Take 20 mg by mouth 3 (three) times daily.   Yes Historical Provider, MD  fenofibrate (TRICOR) 145 MG tablet Take 145 mg by mouth daily.   Yes Historical Provider, MD  gabapentin (NEURONTIN) 800 MG tablet Take 800 mg by mouth 3 (three) times daily.   Yes Historical Provider, MD  Melatonin 10 MG CAPS Take 10 mg by mouth at bedtime.   Yes  Historical Provider, MD  metFORMIN (GLUCOPHAGE) 1000 MG tablet Take 1,000 mg by mouth 2 (two) times daily.   Yes Historical Provider, MD  pantoprazole (PROTONIX) 40 MG tablet Take 1 tablet (40 mg total) by mouth 2 (two) times daily. 10/11/14  Yes Kathie Dike, MD  vitamin B-12 1000 MCG tablet Take 1 tablet (1,000 mcg total) by mouth daily. 11/10/14  Yes Nishant Dhungel, MD  oxyCODONE-acetaminophen (ROXICET) 5-325 MG tablet Take 1-2 tablets by mouth every 4 (four) hours as needed. Patient not taking: Reported on 06/02/2016 01/17/16   Erroll Luna, MD     Family History  Problem Relation Age of Onset  . Healthy Mother     no clotting issues in either parents  . Healthy Father   . Cancer Other     Social History   Social History  . Marital status: Married    Spouse name: N/A  . Number of children: N/A  . Years of education: N/A   Social History Main Topics  . Smoking status: Never Smoker  . Smokeless tobacco: Never Used  . Alcohol use No     Comment: rarely  . Drug use: Yes    Types: Marijuana     Comment: last smoked 2 day ago  . Sexual activity: Not Asked   Other Topics Concern  . None   Social History Narrative  . None     Review of Systems: A 12 point ROS discussed  Review of Systems  Constitutional: Negative.   HENT: Negative.   Respiratory: Negative.   Cardiovascular: Negative.   Gastrointestinal: Negative.   Genitourinary: Negative.   Musculoskeletal: Negative.   Skin: Negative.   Neurological: Negative.   Hematological: Negative.   Psychiatric/Behavioral: Negative.     Vital Signs: BP 103/78   Pulse 81   Temp 98.3 F (36.8 C) (Oral)   Resp 18   Ht 5\' 4"  (1.626 m)   Wt 255 lb (115.7 kg)   SpO2 97%   BMI 43.77 kg/m   Physical Exam  Constitutional: She is oriented to person, place, and time.  Obese, NAD  HENT:  Head: Normocephalic and atraumatic.  Eyes: EOM are normal.  Neck: Normal range of motion.  Cardiovascular: Normal rate, regular  rhythm and normal heart sounds.   Pulmonary/Chest: Effort normal and breath sounds normal. No respiratory distress. She has no wheezes.  Abdominal: Soft. She exhibits no distension. There is no tenderness.  Musculoskeletal: Normal range of motion.  Neurological: She is alert and oriented to person, place, and time.  Skin: Skin is warm and dry.  Psychiatric: She has a normal mood and affect. Her behavior is normal. Judgment and thought content normal.  Vitals reviewed.   Mallampati Score:  MD Evaluation Airway: WNL Heart: WNL Abdomen: WNL Chest/ Lungs: WNL ASA  Classification: 3 Mallampati/Airway Score: One  Imaging: US Abdomen Limited  Result Date: 06/13/2016 CLINICAL DATA:  56 year old female for follow-up of  abdominal wall seroma. Post drainage. Subsequent encounter. EXAM: US ABDOMEN LIMITED - RIGHT UPPER QUADRANT COMPARISON:  06/02/2016 ultrasound.  05/12/2016 CT. FINDINGS: Left-sided residual complex fluid collection spans over 13.6 x 2.5 x 8.1 cm. Superior to catheter to the right of midline is a 9.7 x 7.8 x 8.5 cm complex collection (not clearly cystic). Patient was tender in this region. IMPRESSION: Large anterior abdominal wall fluid collection treated with drainage catheter. Superior to catheter to the right of midline is a 9.7 x 7.8 x 8.5 cm complex collection (not clearly cystic). As the patient is tender in this region, superimposed infection cannot be excluded. Left-sided residual complex fluid collection spans over 13.6 x 2.5 x 8.1 cm These results will be called to the ordering clinician or representative by the Radiologist Assistant, and communication documented in the PACS or zVision Dashboard. Electronically Signed   By: Genia Del M.D.   On: 06/13/2016 11:24   [place] Perc Pleural Drain W/[place] Cath W/img Guide  Result Date: 06/02/2016 INDICATION: 56 year old female with a large fluid collection in the superficial subcutaneous fat upper anterior abdominal wall following  prior ventral hernia repair. EXAM: ULTRASOUND-GUIDED DRAIN PLACEMENT MEDICATIONS: The patient is currently admitted to the hospital and receiving intravenous antibiotics. The antibiotics were administered within an appropriate time frame prior to the initiation of the procedure. ANESTHESIA/SEDATION: Fentanyl 100 mcg IV; Versed 2 mg IV Moderate Sedation Time:  20 minutes The patient was continuously monitored during the procedure by the interventional radiology nurse under my direct supervision. COMPLICATIONS: None immediate. PROCEDURE: Informed written consent was obtained from the patient after a thorough discussion of the procedural risks, benefits and alternatives. All questions were addressed. A timeout was performed prior to the initiation of the procedure. The anterior abdominal wall was interrogated with ultrasound. There is a large sonographically simple fluid collection. The skin was sterilely prepped and draped in standard fashion using chlorhexidine skin prep. Local anesthesia was attained by infiltration with 1% lidocaine. A small dermatotomy was made. Under real-time sonographic guidance, a Cook 10 [place] all-purpose drainage catheter was advanced into the fluid collection using trocar technique. The catheter was advanced off the sharp stylet and formed within the fluid collection. Aspiration was then performed. A total of 870 mL serosanguineous fluid was aspirated. A sample was sent for Gram stain and culture. The drainage catheter was then secured to the skin with 0 Prolene suture and a sterile bandage was applied. Ultrasound interrogation demonstrates complete collapse of the seroma. IMPRESSION: 1. Successful placement of a 10 French drainage catheter into the anterior abdominal wall seroma. 2. Aspiration yields approximately 870 mL serosanguineous fluid. A sample was sent for Gram stain and culture. PLAN: Follow-up in interventional radiology drain clinic in 2 weeks with a limited abdominal  ultrasound to assess for any residual fluid collection. Signed, Criselda Peaches, MD [facility] Electronically Signed   By: Jacqulynn Cadet M.D.   On: 06/02/2016 17:36    Labs:  CBC:  Recent Labs  08/05/15 0930 01/13/16 1015 03/07/16 1119 06/02/16 1215 06/19/16 0710  WBC 6.3 5.6  --  6.3 6.7  HGB 12.7 12.9 12.9 12.2 10.9*  HCT 39.3 39.9 38.0 38.5 34.6*  PLT 257 295  --  361 331    COAGS:  Recent Labs  08/05/15 0930 06/02/16 1215 06/19/16 0710  INR 1.05 0.97 0.96  APTT  --   --  29    BMP:  Recent Labs  08/05/15 0930 01/13/16 1015  03/07/16 1119  NA 140 138 138  K 4.2 4.3 4.1  CL 104 104 105  CO2 22 25  --   GLUCOSE 167* 113* 135*  BUN 11 13 21*  CALCIUM 9.7 9.2  --   CREATININE 0.89 1.10* 1.00  GFRNONAA >60 55*  --   GFRAA >60 >60  --     LIVER FUNCTION TESTS:  Recent Labs  01/13/16 1015  BILITOT 0.7  AST 27  ALT 22  ALKPHOS 43  PROT 6.8  ALBUMIN 3.7    TUMOR MARKERS: No results for input(s): AFPTM, CEA, CA199, CHROMGRNA in the last 8760 hours.  Assessment and Plan:  Persistent abdominal seroma after hernia repair  Will proceed with drain injection to evaluate for fistulous connection and sclerosing with dehydrated alcohol today by Dr. Barbie Banner.  Risks and Benefits discussed with the patient including bleeding, infection.  All of the patient's questions were answered, patient is agreeable to proceed. Consent signed and in chart.  Electronically Signed: Murrell Redden PA-C 06/19/2016, 8:36 AM   I spent a total of  25 Minutes in face to face in clinical consultation, greater than 50% of which was counseling/coordinating care for drain injection and sclerotherapy

## 2016-06-22 ENCOUNTER — Telehealth: Payer: Self-pay | Admitting: Interventional Radiology

## 2016-06-22 NOTE — Progress Notes (Signed)
I spoke with Krystal Campbell today. She had a fever last night to 100.7 associated with feeling "hot". She feels better this morning but continues to have some pain at the seroma site (similar to prior to the alcohol ablation procedure). The output is pink but there is no frank pus. We decided to treat it conservatively and watch for ongoing fevers. She is to return next week for a follow up ultrasound and possible drain removal.

## 2016-06-27 ENCOUNTER — Other Ambulatory Visit: Payer: Self-pay | Admitting: General Surgery

## 2016-06-27 ENCOUNTER — Other Ambulatory Visit: Payer: Self-pay | Admitting: Radiology

## 2016-06-27 ENCOUNTER — Ambulatory Visit
Admission: RE | Admit: 2016-06-27 | Discharge: 2016-06-27 | Disposition: A | Discharge status: Home / Self Care | Payer: Medicare PPO | Source: Ambulatory Visit | Attending: General Surgery | Admitting: General Surgery

## 2016-06-27 ENCOUNTER — Ambulatory Visit
Admission: RE | Admit: 2016-06-27 | Discharge: 2016-06-27 | Disposition: A | Discharge status: Home / Self Care | Payer: Medicare PPO | Source: Ambulatory Visit | Attending: Interventional Radiology | Admitting: Interventional Radiology

## 2016-06-27 DIAGNOSIS — S301XXA Contusion of abdominal wall, initial encounter: Secondary | ICD-10-CM

## 2016-06-27 DIAGNOSIS — S301XXD Contusion of abdominal wall, subsequent encounter: Secondary | ICD-10-CM

## 2016-06-27 HISTORY — PX: IR RADIOLOGIST EVAL & MGMT: IMG5224

## 2016-06-27 LAB — CBC
HCT: 35.8 % (ref 35.0–45.0)
HEMOGLOBIN: 11.5 g/dL — AB (ref 11.7–15.5)
MCH: 29 pg (ref 27.0–33.0)
MCHC: 32.1 g/dL (ref 32.0–36.0)
MCV: 90.4 fL (ref 80.0–100.0)
MPV: 9.9 fL (ref 7.5–12.5)
PLATELETS: 550 10*3/uL — AB (ref 140–400)
RBC: 3.96 MIL/uL (ref 3.80–5.10)
RDW: 14.4 % (ref 11.0–15.0)
WBC: 7.2 10*3/uL (ref 3.8–10.8)

## 2016-06-27 MED ORDER — IOPAMIDOL (ISOVUE-300) INJECTION 61%
100.0000 mL | Freq: Once | INTRAVENOUS | Status: AC | PRN
  Administered 2016-06-27: 100 mL via INTRAVENOUS

## 2016-06-27 NOTE — Progress Notes (Signed)
Patient ID: Krystal Campbell, female   DOB: 1960-08-06, 56 y.o.   MRN: [id number]       Chief Complaint: Patient was seen in consultation today for  Chief Complaint  Patient presents with  . Follow-up   at the request of Hoss,Arthur  Referring Physician(s): Hoss,Arthur  History of Present Illness: Krystal Campbell is a 56 y.o. female who returns for her scheduled follow-up appointment post ethanol sclerosis of postop seroma cavity. Her timeline is as follows: 01/17/2016 open repair of incisional hernia with mesh 05/12/2016 CT demonstrates a large anterior abdominal wall fluid collection   in the deep subcutaneous tissues 06/02/2016 ultrasound guided drain catheter placement removing 870 mL serosanguineous fluid, cultures negative 06/13/2016 ultrasound demonstrates residual complex fluid collection and a small collection adjacent to the drain catheter 06/19/2016 ethanol sclerotherapy of the collection through the drain catheter because of continued high outputs  Patient describes worsening pain around the site after ethanol sclerotherapy. Continue outputs approximately 100 mL daily. She describes fever up to 100.7. No vomiting or diarrhea.   Past Medical History:  Diagnosis Date  . Anemia   . Anxiety   . Arthritis    in knees  . Cancer ([place])    colon  . Carpal tunnel syndrome of right wrist   . Depression   . Diabetes mellitus without complication ([place])   . Diverticulosis   . GERD (gastroesophageal reflux disease)   . History of blood transfusion    10/09/14  . History of hiatal hernia   . Hyperlipidemia   . Incisional hernia   . Pneumonia   . Pulmonary embolism ([place])   . Wears glasses     Past Surgical History:  Procedure Laterality Date  . ABDOMINAL HYSTERECTOMY    . APPENDECTOMY    . COLONOSCOPY WITH PROPOFOL Left 10/30/2014   Procedure: COLONOSCOPY WITH PROPOFOL;  Surgeon: Ronald Lobo, MD;  Location: [place];  Service: Endoscopy;  Laterality: Left;  . DILATION AND  CURETTAGE OF UTERUS    . ESOPHAGOGASTRODUODENOSCOPY (EGD) WITH PROPOFOL Left 10/30/2014   Procedure: ESOPHAGOGASTRODUODENOSCOPY (EGD) WITH PROPOFOL;  Surgeon: Ronald Lobo, MD;  Location: [facility] ENDOSCOPY;  Service: Endoscopy;  Laterality: Left;  . INCISIONAL HERNIA REPAIR N/A 01/17/2016   Procedure: INCISIONAL HERNIA REPAIR;  Surgeon: Erroll Luna, MD;  Location: [place];  Service: General;  Laterality: N/A;  . INSERTION OF MESH N/A 01/17/2016   Procedure: INSERTION OF MESH;  Surgeon: Erroll Luna, MD;  Location: [place];  Service: General;  Laterality: N/A;  . IR GENERIC HISTORICAL  06/19/2016   IR SCLEROTHERAPY OF A FLUID COLLECTION 06/19/2016 Marybelle Killings, MD [facility]-INTERV RAD  . IVC FILTER PLACEMENT ([facility] HX)    . JOINT REPLACEMENT  4/15   Total right knee replacement; left knee scoped  . KIDNEY SURGERY     when patient was a teenager  . LAPAROSCOPIC PARTIAL COLECTOMY N/A 11/04/2014   Procedure: LAPAROSCOPIC PARTIAL COLECTOMY POSSIBLE OPEN;  Surgeon: Erroll Luna, MD;  Location: [place];  Service: General;  Laterality: N/A;  . PORT-A-CATH REMOVAL Right 03/07/2016   Procedure: REMOVAL PORT-A-CATH;  Surgeon: Erroll Luna, MD;  Location: [place];  Service: General;  Laterality: Right;  . PORTACATH PLACEMENT Right 12/22/2014   Procedure: INSERTION PORT-A-CATH;  Surgeon: Erroll Luna, MD;  Location: [place];  Service: General;  Laterality: Right;  . REPLACEMENT TOTAL KNEE      Allergies: Patient has no known allergies.  Medications: Prior to Admission medications  Medication Sig Start Date End Date Taking? Authorizing Provider  aspirin EC 81 MG tablet Take 81 mg by mouth daily.   Yes Historical Provider, MD  Calcium Polycarbophil (FIBER) 625 MG TABS Take 625 mg by mouth 2 (two) times daily.    Yes Historical Provider, MD  fenofibrate (TRICOR) 145 MG tablet Take 145 mg by mouth daily.   Yes Historical Provider, MD  gabapentin (NEURONTIN)  800 MG tablet Take 800 mg by mouth 3 (three) times daily.   Yes Historical Provider, MD  Melatonin 10 MG CAPS Take 10 mg by mouth at bedtime.   Yes Historical Provider, MD  metFORMIN (GLUCOPHAGE) 1000 MG tablet Take 1,000 mg by mouth 2 (two) times daily.   Yes Historical Provider, MD  pantoprazole (PROTONIX) 40 MG tablet Take 1 tablet (40 mg total) by mouth 2 (two) times daily. 10/11/14  Yes Kathie Dike, MD  vitamin B-12 1000 MCG tablet Take 1 tablet (1,000 mcg total) by mouth daily. 11/10/14  Yes Nishant Dhungel, MD  citalopram (CELEXA) 20 MG tablet Take 20 mg by mouth 3 (three) times daily.    Historical Provider, MD  oxyCODONE-acetaminophen (ROXICET) 5-325 MG tablet Take 1-2 tablets by mouth every 4 (four) hours as needed. Patient not taking: Reported on 06/02/2016 01/17/16   Erroll Luna, MD     Family History  Problem Relation Age of Onset  . Healthy Mother     no clotting issues in either parents  . Healthy Father   . Cancer Other     Social History   Social History  . Marital status: Married    Spouse name: N/A  . Number of children: N/A  . Years of education: N/A   Social History Main Topics  . Smoking status: Never Smoker  . Smokeless tobacco: Never Used  . Alcohol use No     Comment: rarely  . Drug use: Yes    Types: Marijuana     Comment: last smoked 2 day ago  . Sexual activity: Not on file   Other Topics Concern  . Not on file   Social History Narrative  . No narrative on file    ECOG Status: 1 - Symptomatic but completely ambulatory  Review of Systems: A 12 point ROS discussed and pertinent positives are indicated in the HPI above.  All other systems are negative.  Review of Systems  Vital Signs: BP 115/76 (BP Location: Left Arm, Patient Position: Sitting, Cuff Size: Large)   Pulse 87   Temp 98.9 F (37.2 C) (Oral)   Resp 17   SpO2 97%   Physical Exam  Mallampati Score:     Imaging: US Abdomen Limited  Result Date: 06/13/2016 CLINICAL  DATA:  56 year old female for follow-up of abdominal wall seroma. Post drainage. Subsequent encounter. EXAM: US ABDOMEN LIMITED - RIGHT UPPER QUADRANT COMPARISON:  06/02/2016 ultrasound.  05/12/2016 CT. FINDINGS: Left-sided residual complex fluid collection spans over 13.6 x 2.5 x 8.1 cm. Superior to catheter to the right of midline is a 9.7 x 7.8 x 8.5 cm complex collection (not clearly cystic). Patient was tender in this region. IMPRESSION: Large anterior abdominal wall fluid collection treated with drainage catheter. Superior to catheter to the right of midline is a 9.7 x 7.8 x 8.5 cm complex collection (not clearly cystic). As the patient is tender in this region, superimposed infection cannot be excluded. Left-sided residual complex fluid collection spans over 13.6 x 2.5 x 8.1 cm These results will be called to the ordering  clinician or representative by the Radiologist Assistant, and communication documented in the PACS or zVision Dashboard. Electronically Signed   By: Genia Del M.D.   On: 06/13/2016 11:24   Ir Sclerotherapy Of A Fluid Collection  Result Date: 06/19/2016 INDICATION: Continuous drainage from abdominal seroma drain. EXAM: FLUOROSCOPIC GUIDANCE FOR SEROMA SCLEROTHERAPY MEDICATIONS: The patient is currently admitted to the hospital and receiving intravenous antibiotics. The antibiotics were administered within an appropriate time frame prior to the initiation of the procedure. ANESTHESIA/SEDATION: Fentanyl 50 mcg IV; Versed 1 mg IV Moderate Sedation Time:  3 The patient was continuously monitored during the procedure by the interventional radiology nurse under my direct supervision. COMPLICATIONS: None immediate. PROCEDURE: Informed written consent was obtained from the patient after a thorough discussion of the procedural risks, benefits and alternatives. All questions were addressed. Maximal Sterile Barrier Technique was utilized including caps, mask, sterile gowns, sterile gloves,  sterile drape, hand hygiene and skin antiseptic. A timeout was performed prior to the initiation of the procedure. Contrast was injected into the abdominal seroma drain. Contrast was localized in the cavity. 20 cc absolute alcohol was then injected followed by a 20 cc saline. This was left in place for 30 minutes. Alcohol in saline was then aspirated. The drain was reattached to the JP bulb. FINDINGS: Contrast fills the potential space in the abdominal seroma. There is no extravasation of contrast into the abdominal cavity. IMPRESSION: Successful abdominal wall seroma sclerotherapy utilizing alcohol. The patient will follow-up in 1 week, hopefully for drain removal, if output diminishes. Electronically Signed   By: Marybelle Killings M.D.   On: 06/19/2016 11:11   [place] Perc Pleural Drain W/[place] Cath W/img Guide  Result Date: 06/02/2016 INDICATION: 56 year old female with a large fluid collection in the superficial subcutaneous fat upper anterior abdominal wall following prior ventral hernia repair. EXAM: ULTRASOUND-GUIDED DRAIN PLACEMENT MEDICATIONS: The patient is currently admitted to the hospital and receiving intravenous antibiotics. The antibiotics were administered within an appropriate time frame prior to the initiation of the procedure. ANESTHESIA/SEDATION: Fentanyl 100 mcg IV; Versed 2 mg IV Moderate Sedation Time:  20 minutes The patient was continuously monitored during the procedure by the interventional radiology nurse under my direct supervision. COMPLICATIONS: None immediate. PROCEDURE: Informed written consent was obtained from the patient after a thorough discussion of the procedural risks, benefits and alternatives. All questions were addressed. A timeout was performed prior to the initiation of the procedure. The anterior abdominal wall was interrogated with ultrasound. There is a large sonographically simple fluid collection. The skin was sterilely prepped and draped in standard fashion using  chlorhexidine skin prep. Local anesthesia was attained by infiltration with 1% lidocaine. A small dermatotomy was made. Under real-time sonographic guidance, a Cook 10 [place] all-purpose drainage catheter was advanced into the fluid collection using trocar technique. The catheter was advanced off the sharp stylet and formed within the fluid collection. Aspiration was then performed. A total of 870 mL serosanguineous fluid was aspirated. A sample was sent for Gram stain and culture. The drainage catheter was then secured to the skin with 0 Prolene suture and a sterile bandage was applied. Ultrasound interrogation demonstrates complete collapse of the seroma. IMPRESSION: 1. Successful placement of a 10 French drainage catheter into the anterior abdominal wall seroma. 2. Aspiration yields approximately 870 mL serosanguineous fluid. A sample was sent for Gram stain and culture. PLAN: Follow-up in interventional radiology drain clinic in 2 weeks with a limited abdominal ultrasound to assess for any residual fluid  collection. Signed, Criselda Peaches, MD [facility] Radiology Electronically Signed   By: Jacqulynn Cadet M.D.   On: 06/02/2016 17:36   LIMITED ABDOMINAL ULTRASOUND  COMPARISON:  06/19/2016 and previous  FINDINGS: No significant residual fluid is evident. The coapted walls of the collapsed seroma cavity are evident.  IMPRESSION: 1. No residual fluid in the region of previously noted postop seroma.   Electronically Signed   By: Lucrezia Europe M.D.   On: 06/27/2016 12:43  Labs:  CBC:  Recent Labs  08/05/15 0930 01/13/16 1015 03/07/16 1119 06/02/16 1215 06/19/16 0710  WBC 6.3 5.6  --  6.3 6.7  HGB 12.7 12.9 12.9 12.2 10.9*  HCT 39.3 39.9 38.0 38.5 34.6*  PLT 257 295  --  361 331    COAGS:  Recent Labs  08/05/15 0930 06/02/16 1215 06/19/16 0710  INR 1.05 0.97 0.96  APTT  --   --  29    BMP:  Recent Labs   08/05/15 0930 01/13/16 1015 03/07/16 1119  NA 140 138 138  K 4.2 4.3 4.1  CL 104 104 105  CO2 22 25  --   GLUCOSE 167* 113* 135*  BUN 11 13 21*  CALCIUM 9.7 9.2  --   CREATININE 0.89 1.10* 1.00  GFRNONAA >60 55*  --   GFRAA >60 >60  --     LIVER FUNCTION TESTS:  Recent Labs  01/13/16 1015  BILITOT 0.7  AST 27  ALT 22  ALKPHOS 43  PROT 6.8  ALBUMIN 3.7    TUMOR MARKERS: No results for input(s): AFPTM, CEA, CA199, CHROMGRNA in the last 8760 hours.  Assessment and Plan:  My impression is that this patient is having an unusual amount of residual pain and continued output post ethanol sclerotherapy of postop seroma. Ultrasound shows no definite residual fluid or abscess. However, patient is tearful during examination with obvious tenderness. Primary considerations include superimposed infection versus tissue injury from leakage of the ethanol from  the seroma cavity versus recurrent hernia. I think a CT abdomen would be useful for further evaluation of what happening in her deep anterior abdominal body wall. I reviewed the findings over the phone with Dr. Dalbert Batman covering in Dr Cornett's absence,  and he concurred. We will also send a CBC.  For now, we'll leave the drain catheter to gravity drainage. We'll have her follow-up with Dr. Brantley Stage.   Thank you for this interesting consult.  I greatly enjoyed meeting Krystal Campbell and look forward to participating in their care.  A copy of this report was sent to the requesting provider on this date.  Electronically Signed: Cadarius Nevares III, DAYNE Krystal Campbell 06/27/2016, 12:44 PM   I spent a total of    25 Minutes in face to face in clinical consultation, greater than 50% of which was counseling/coordinating care for pain post sclerosis of postop hematoma.

## 2016-07-04 ENCOUNTER — Ambulatory Visit: Payer: Self-pay | Admitting: Surgery

## 2016-07-04 ENCOUNTER — Encounter (HOSPITAL_BASED_OUTPATIENT_CLINIC_OR_DEPARTMENT_OTHER): Payer: Self-pay | Admitting: *Deleted

## 2016-07-04 ENCOUNTER — Encounter (HOSPITAL_BASED_OUTPATIENT_CLINIC_OR_DEPARTMENT_OTHER)
Admission: RE | Admit: 2016-07-04 | Discharge: 2016-07-04 | Disposition: A | Discharge status: Home / Self Care | Payer: Medicare PPO | Source: Ambulatory Visit | Attending: Surgery | Admitting: Surgery

## 2016-07-04 DIAGNOSIS — Z9221 Personal history of antineoplastic chemotherapy: Secondary | ICD-10-CM | POA: Diagnosis not present

## 2016-07-04 DIAGNOSIS — I10 Essential (primary) hypertension: Secondary | ICD-10-CM | POA: Diagnosis not present

## 2016-07-04 DIAGNOSIS — Z9049 Acquired absence of other specified parts of digestive tract: Secondary | ICD-10-CM | POA: Diagnosis not present

## 2016-07-04 DIAGNOSIS — L7634 Postprocedural seroma of skin and subcutaneous tissue following other procedure: Secondary | ICD-10-CM | POA: Diagnosis not present

## 2016-07-04 DIAGNOSIS — E119 Type 2 diabetes mellitus without complications: Secondary | ICD-10-CM | POA: Diagnosis not present

## 2016-07-04 DIAGNOSIS — F329 Major depressive disorder, single episode, unspecified: Secondary | ICD-10-CM | POA: Diagnosis not present

## 2016-07-04 DIAGNOSIS — Z79899 Other long term (current) drug therapy: Secondary | ICD-10-CM | POA: Diagnosis not present

## 2016-07-04 DIAGNOSIS — K219 Gastro-esophageal reflux disease without esophagitis: Secondary | ICD-10-CM | POA: Diagnosis not present

## 2016-07-04 DIAGNOSIS — Z85038 Personal history of other malignant neoplasm of large intestine: Secondary | ICD-10-CM | POA: Diagnosis not present

## 2016-07-04 DIAGNOSIS — F419 Anxiety disorder, unspecified: Secondary | ICD-10-CM | POA: Diagnosis not present

## 2016-07-04 DIAGNOSIS — Y838 Other surgical procedures as the cause of abnormal reaction of the patient, or of later complication, without mention of misadventure at the time of the procedure: Secondary | ICD-10-CM | POA: Diagnosis not present

## 2016-07-04 LAB — BASIC METABOLIC PANEL
ANION GAP: 10 (ref 5–15)
BUN: 10 mg/dL (ref 6–20)
CALCIUM: 9 mg/dL (ref 8.9–10.3)
CO2: 25 mmol/L (ref 22–32)
CREATININE: 0.94 mg/dL (ref 0.44–1.00)
Chloride: 104 mmol/L (ref 101–111)
GLUCOSE: 134 mg/dL — AB (ref 65–99)
Potassium: 4.4 mmol/L (ref 3.5–5.1)
Sodium: 139 mmol/L (ref 135–145)

## 2016-07-04 NOTE — Pre-Procedure Instructions (Signed)
8 ounce water bottle given to patient and instructed to finish by 0430 DOS according to ERAS protocol.  She voiced understanding.

## 2016-07-04 NOTE — H&P (Signed)
Krystal Campbell 07/04/2016 11:02 AM Location: [facility] Patient #: [id number] DOB: 06/17/1960 Married / Language: [name] / Race: White Female  History of Present Illness Krystal Moores 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 She underwent open repair of an incisional hernia at October 2017. She was seen in January had a fluid collection which was drained by interventional radiology. She had a drain for about 3 weeks and the output would not improve. It is serous in nature. She underwent sclerotherapy by interventional radiology about 2 weeks ago. She had considerable pain from this. Her drainage is still well over 80 cc a day but serious. She is quite uncomfortable. She has significant discomfort from the ethanol injection. She's had no redness, fever or chills. Her bowel function overall is remained normal. CT scan done last week shows the catheter in good position. There is no large undrained seroma or signs of infection. She comes in today because the suture broke around her catheter. She is very uncomfortable.  The patient is a 56 year old female.   Allergies ([name], CMA; 07/04/2016 11:03 AM) No Known Drug Allergies 11/17/2014 Allergies Reconciled  Medication History ([name], CMA; 07/04/2016 11:03 AM) OxyCODONE HCl (5MG  Tablet, 1 (one) Tablet Oral every four hours, as needed, Taken starting 06/27/2016) Active. Gabapentin (800MG  Tablet, Oral three times daily) Active. Citalopram Hydrobromide (20MG  Tablet, Oral daily) Active. Fiber (625MG  Tablet, Oral two times daily) Active. Aspirin EC Low Dose (81MG  Tablet DR, Oral daily) Active. Tricor (145MG  Tablet, Oral daily) Active. Melatonin (10MG  Capsule, Oral daily) Active. (Take two capsules at bedtime.) Protonix (20MG  Tablet DR, Oral two times daily) Active. Vitamin B12 (1000MCG Tablet ER, Oral) Active. Medications  Reconciled    Vitals ([name] CMA; 07/04/2016 11:03 AM) 07/04/2016 11:03 AM Weight: 259.6 lb Height: 64in Body Surface Area: 2.19 m Body Mass Index: 44.56 kg/m  Temp.: 99.76F  Pulse: 78 (Regular)  BP: 128/84 (Sitting, Left Arm, Standard)      Physical Exam ([name] MD; 07/04/2016 12:02 PM)  General Note: Teary-eyed  Head and Neck Head-normocephalic, atraumatic with no lesions or palpable masses. Trachea-midline. Thyroid Gland Characteristics - normal size and consistency.  Chest and Lung Exam Chest and lung exam reveals -quiet, even and easy respiratory effort with no use of accessory muscles and on auscultation, normal breath sounds, no adventitious sounds and normal vocal resonance. Inspection Chest Wall - Normal. Back - normal.  Cardiovascular Cardiovascular examination reveals -normal heart sounds, regular rate and rhythm with no murmurs and normal pedal pulses bilaterally.  Abdomen Note: Catheter just below the umbilicus exiting the skin. Side is not red. There is a portion of her incision above this were previous hernia repair was. There is not a large seroma cavity but there is about 100 cc of serous fluid in the bag today. It is tender but not read around the area.  Musculoskeletal Normal Exam - Left-Upper Extremity Strength Normal and Lower Extremity Strength Normal. Normal Exam - Right-Upper Extremity Strength Normal and Lower Extremity Strength Normal.    Assessment & Plan (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 She continues to have about 100 cc of serous drainage. It's unlikely this is going to resolve without surgical intervention at this point in time. I recommended drainage of her seroma in the operating room which is chronic.  Risks, benefits and alternatives to surgery were discussed to  include prolonged drainage with her pigtail catheter. She does [place] of pain from this and desires surgical intervention at this point in time. Risk of bleeding, infection, mesh exposure, mesh infection, recurrent hernia, bowel injury, internal organ injury, death, DVT, and other potential risks were discussed with the patient today as well.  Current Plans Pt Education - [facility] Free Text Education/Instructions: discussed with patient and provided information. The anatomy and the physiology was discussed. The pathophysiology and natural history of the disease was discussed. Options were discussed and recommendations were made. Technique, risks, benefits, & alternatives were discussed. Risks such as stroke, heart attack, bleeding, indection, death, and other risks discussed. Questions answered. The patient agrees to proceed. Pt Education - [facility] Free Text Education/Instructions: discussed with patient and provided information. Pt Education - [facility] Education - Written Instructions given: discussed with patient and provided information.

## 2016-07-06 ENCOUNTER — Ambulatory Visit (HOSPITAL_BASED_OUTPATIENT_CLINIC_OR_DEPARTMENT_OTHER)
Admission: RE | Admit: 2016-07-06 | Discharge: 2016-07-06 | Disposition: A | Discharge status: Home / Self Care | Payer: Medicare PPO | Source: Ambulatory Visit | Attending: Surgery | Admitting: Surgery

## 2016-07-06 ENCOUNTER — Encounter (HOSPITAL_BASED_OUTPATIENT_CLINIC_OR_DEPARTMENT_OTHER): Payer: Self-pay

## 2016-07-06 ENCOUNTER — Ambulatory Visit (HOSPITAL_BASED_OUTPATIENT_CLINIC_OR_DEPARTMENT_OTHER): Payer: Medicare PPO | Admitting: Anesthesiology

## 2016-07-06 ENCOUNTER — Encounter (HOSPITAL_BASED_OUTPATIENT_CLINIC_OR_DEPARTMENT_OTHER)
Admission: RE | Disposition: A | Discharge status: Home / Self Care | Payer: Self-pay | Source: Ambulatory Visit | Attending: Surgery

## 2016-07-06 DIAGNOSIS — I10 Essential (primary) hypertension: Secondary | ICD-10-CM | POA: Insufficient documentation

## 2016-07-06 DIAGNOSIS — F329 Major depressive disorder, single episode, unspecified: Secondary | ICD-10-CM | POA: Insufficient documentation

## 2016-07-06 DIAGNOSIS — L7634 Postprocedural seroma of skin and subcutaneous tissue following other procedure: Secondary | ICD-10-CM | POA: Diagnosis not present

## 2016-07-06 DIAGNOSIS — K219 Gastro-esophageal reflux disease without esophagitis: Secondary | ICD-10-CM | POA: Insufficient documentation

## 2016-07-06 DIAGNOSIS — E119 Type 2 diabetes mellitus without complications: Secondary | ICD-10-CM | POA: Insufficient documentation

## 2016-07-06 DIAGNOSIS — Z79899 Other long term (current) drug therapy: Secondary | ICD-10-CM | POA: Insufficient documentation

## 2016-07-06 DIAGNOSIS — Z9049 Acquired absence of other specified parts of digestive tract: Secondary | ICD-10-CM | POA: Insufficient documentation

## 2016-07-06 DIAGNOSIS — Z9221 Personal history of antineoplastic chemotherapy: Secondary | ICD-10-CM | POA: Diagnosis not present

## 2016-07-06 DIAGNOSIS — Z85038 Personal history of other malignant neoplasm of large intestine: Secondary | ICD-10-CM | POA: Insufficient documentation

## 2016-07-06 DIAGNOSIS — F419 Anxiety disorder, unspecified: Secondary | ICD-10-CM | POA: Insufficient documentation

## 2016-07-06 DIAGNOSIS — Y838 Other surgical procedures as the cause of abnormal reaction of the patient, or of later complication, without mention of misadventure at the time of the procedure: Secondary | ICD-10-CM | POA: Insufficient documentation

## 2016-07-06 HISTORY — PX: INCISION AND DRAINAGE ABSCESS: SHX5864

## 2016-07-06 HISTORY — DX: Chronic pain syndrome: G89.4

## 2016-07-06 LAB — POCT HEMOGLOBIN-HEMACUE: Hemoglobin: 10.6 g/dL — ABNORMAL LOW (ref 12.0–15.0)

## 2016-07-06 LAB — GLUCOSE, CAPILLARY: GLUCOSE-CAPILLARY: 142 mg/dL — AB (ref 65–99)

## 2016-07-06 SURGERY — INCISION AND DRAINAGE, ABSCESS
Anesthesia: General | Site: Abdomen

## 2016-07-06 MED ORDER — LIDOCAINE HCL (CARDIAC) 20 MG/ML IV SOLN
INTRAVENOUS | Status: DC | PRN
  Administered 2016-07-06: 30 mg via INTRAVENOUS

## 2016-07-06 MED ORDER — SCOPOLAMINE 1 MG/3DAYS TD PT72: 1.0000 | MEDICATED_PATCH | Freq: Once | TRANSDERMAL | Status: DC | PRN

## 2016-07-06 MED ORDER — DEXAMETHASONE SODIUM PHOSPHATE 10 MG/ML IJ SOLN
INTRAMUSCULAR | Status: AC
  Filled 2016-07-06: qty 1

## 2016-07-06 MED ORDER — ONDANSETRON HCL 4 MG/2ML IJ SOLN
INTRAMUSCULAR | Status: DC | PRN
  Administered 2016-07-06: 4 mg via INTRAVENOUS

## 2016-07-06 MED ORDER — FENTANYL CITRATE (PF) 100 MCG/2ML IJ SOLN
INTRAMUSCULAR | Status: DC | PRN
  Administered 2016-07-06 (×2): 25 ug via INTRAVENOUS

## 2016-07-06 MED ORDER — HYDROMORPHONE HCL 1 MG/ML IJ SOLN
0.2500 mg | INTRAMUSCULAR | Status: DC | PRN
  Administered 2016-07-06 (×4): 0.5 mg via INTRAVENOUS

## 2016-07-06 MED ORDER — CELECOXIB 400 MG PO CAPS
400.0000 mg | ORAL_CAPSULE | ORAL | Status: AC
  Administered 2016-07-06: 400 mg via ORAL

## 2016-07-06 MED ORDER — MIDAZOLAM HCL 2 MG/2ML IJ SOLN
INTRAMUSCULAR | Status: AC
  Filled 2016-07-06: qty 2

## 2016-07-06 MED ORDER — ACETAMINOPHEN 500 MG PO TABS
1000.0000 mg | ORAL_TABLET | ORAL | Status: AC
  Administered 2016-07-06: 1000 mg via ORAL

## 2016-07-06 MED ORDER — FENTANYL CITRATE (PF) 100 MCG/2ML IJ SOLN
INTRAMUSCULAR | Status: AC
  Filled 2016-07-06: qty 2

## 2016-07-06 MED ORDER — CELECOXIB 200 MG PO CAPS
ORAL_CAPSULE | ORAL | Status: AC
  Filled 2016-07-06: qty 2

## 2016-07-06 MED ORDER — PROPOFOL 500 MG/50ML IV EMUL
INTRAVENOUS | Status: AC
  Filled 2016-07-06: qty 50

## 2016-07-06 MED ORDER — GABAPENTIN 300 MG PO CAPS
ORAL_CAPSULE | ORAL | Status: AC
  Filled 2016-07-06: qty 1

## 2016-07-06 MED ORDER — BUPIVACAINE HCL (PF) 0.25 % IJ SOLN
INTRAMUSCULAR | Status: AC
  Filled 2016-07-06: qty 30

## 2016-07-06 MED ORDER — BUPIVACAINE HCL (PF) 0.25 % IJ SOLN
INTRAMUSCULAR | Status: DC | PRN
  Administered 2016-07-06: 20 mL

## 2016-07-06 MED ORDER — MIDAZOLAM HCL 5 MG/5ML IJ SOLN
INTRAMUSCULAR | Status: DC | PRN
  Administered 2016-07-06: 2 mg via INTRAVENOUS

## 2016-07-06 MED ORDER — DEXTROSE 5 % IV SOLN
3.0000 g | INTRAVENOUS | Status: AC
  Administered 2016-07-06: 2 g via INTRAVENOUS

## 2016-07-06 MED ORDER — ACETAMINOPHEN 500 MG PO TABS
ORAL_TABLET | ORAL | Status: AC
  Filled 2016-07-06: qty 2

## 2016-07-06 MED ORDER — CHLORHEXIDINE GLUCONATE CLOTH 2 % EX PADS: 6.0000 | MEDICATED_PAD | Freq: Once | CUTANEOUS | Status: DC

## 2016-07-06 MED ORDER — MEPERIDINE HCL 25 MG/ML IJ SOLN: 6.2500 mg | INTRAMUSCULAR | Status: DC | PRN

## 2016-07-06 MED ORDER — FENTANYL CITRATE (PF) 100 MCG/2ML IJ SOLN: 50.0000 ug | INTRAMUSCULAR | Status: DC | PRN

## 2016-07-06 MED ORDER — LACTATED RINGERS IV SOLN
INTRAVENOUS | Status: DC
  Administered 2016-07-06 (×2): via INTRAVENOUS

## 2016-07-06 MED ORDER — GABAPENTIN 300 MG PO CAPS
300.0000 mg | ORAL_CAPSULE | ORAL | Status: AC
  Administered 2016-07-06: 300 mg via ORAL

## 2016-07-06 MED ORDER — 0.9 % SODIUM CHLORIDE (POUR BTL) OPTIME
TOPICAL | Status: DC | PRN
  Administered 2016-07-06: 1000 mL

## 2016-07-06 MED ORDER — ONDANSETRON HCL 4 MG/2ML IJ SOLN
INTRAMUSCULAR | Status: AC
  Filled 2016-07-06: qty 2

## 2016-07-06 MED ORDER — LIDOCAINE 2% (20 MG/ML) 5 ML SYRINGE
INTRAMUSCULAR | Status: AC
  Filled 2016-07-06: qty 5

## 2016-07-06 MED ORDER — HYDROMORPHONE HCL 1 MG/ML IJ SOLN
INTRAMUSCULAR | Status: AC
  Filled 2016-07-06: qty 1

## 2016-07-06 MED ORDER — CEFAZOLIN SODIUM-DEXTROSE 2-4 GM/100ML-% IV SOLN
INTRAVENOUS | Status: AC
  Filled 2016-07-06: qty 100

## 2016-07-06 MED ORDER — PROPOFOL 10 MG/ML IV BOLUS
INTRAVENOUS | Status: DC | PRN
  Administered 2016-07-06: 150 mg via INTRAVENOUS

## 2016-07-06 MED ORDER — FENTANYL CITRATE (PF) 100 MCG/2ML IJ SOLN: INTRAMUSCULAR | Status: DC | PRN

## 2016-07-06 MED ORDER — MIDAZOLAM HCL 2 MG/2ML IJ SOLN: 1.0000 mg | INTRAMUSCULAR | Status: DC | PRN

## 2016-07-06 MED ORDER — ONDANSETRON HCL 4 MG/2ML IJ SOLN: 4.0000 mg | Freq: Once | INTRAMUSCULAR | Status: DC | PRN

## 2016-07-06 MED ORDER — OXYCODONE HCL 5 MG PO TABS: 5.0000 mg | ORAL_TABLET | Freq: Four times a day (QID) | ORAL | 0 refills | Status: DC | PRN

## 2016-07-06 SURGICAL SUPPLY — 48 items
BENZOIN TINCTURE PRP APPL 2/3 (GAUZE/BANDAGES/DRESSINGS) IMPLANT
BLADE SURG 15 STRL LF DISP TIS (BLADE) ×1 IMPLANT
BLADE SURG 15 STRL SS (BLADE) ×2
CANISTER SUCT 1200ML W/VALVE (MISCELLANEOUS) ×3 IMPLANT
CLOSURE WOUND 1/2 X4 (GAUZE/BANDAGES/DRESSINGS)
COVER BACK TABLE 60X90IN (DRAPES) ×3 IMPLANT
COVER MAYO STAND STRL (DRAPES) ×3 IMPLANT
DERMABOND ADVANCED (GAUZE/BANDAGES/DRESSINGS) ×2
DERMABOND ADVANCED .7 DNX12 (GAUZE/BANDAGES/DRESSINGS) ×1 IMPLANT
DRAIN CHANNEL 19F RND (DRAIN) ×3 IMPLANT
DRAPE LAPAROTOMY 100X72 PEDS (DRAPES) ×3 IMPLANT
DRAPE UTILITY XL STRL (DRAPES) ×3 IMPLANT
ELECT COATED BLADE 2.86 ST (ELECTRODE) ×3 IMPLANT
ELECT REM PT RETURN 9FT ADLT (ELECTROSURGICAL) ×3
ELECTRODE REM PT RTRN 9FT ADLT (ELECTROSURGICAL) ×1 IMPLANT
EVACUATOR SILICONE 100CC (DRAIN) ×3 IMPLANT
GAUZE SPONGE 4X4 12PLY STRL (GAUZE/BANDAGES/DRESSINGS) ×3 IMPLANT
GLOVE BIO SURGEON STRL SZ7.5 (GLOVE) ×3 IMPLANT
GLOVE BIOGEL PI IND STRL 7.0 (GLOVE) ×2 IMPLANT
GLOVE BIOGEL PI IND STRL 7.5 (GLOVE) ×1 IMPLANT
GLOVE BIOGEL PI IND STRL 8 (GLOVE) ×1 IMPLANT
GLOVE BIOGEL PI INDICATOR 7.0 (GLOVE) ×4
GLOVE BIOGEL PI INDICATOR 7.5 (GLOVE) ×2
GLOVE BIOGEL PI INDICATOR 8 (GLOVE) ×2
GLOVE ECLIPSE 8.0 STRL XLNG CF (GLOVE) ×3 IMPLANT
GLOVE SURG SS PI 6.5 STRL IVOR (GLOVE) ×3 IMPLANT
GOWN STRL REUS W/ TWL LRG LVL3 (GOWN DISPOSABLE) ×3 IMPLANT
GOWN STRL REUS W/TWL LRG LVL3 (GOWN DISPOSABLE) ×6
NEEDLE HYPO 25X1 1.5 SAFETY (NEEDLE) ×3 IMPLANT
NS IRRIG 1000ML POUR BTL (IV SOLUTION) ×3 IMPLANT
PACK BASIN DAY SURGERY FS (CUSTOM PROCEDURE TRAY) ×3 IMPLANT
PENCIL BUTTON HOLSTER BLD 10FT (ELECTRODE) ×3 IMPLANT
SLEEVE SCD COMPRESS KNEE MED (MISCELLANEOUS) ×3 IMPLANT
SPONGE LAP 4X18 X RAY DECT (DISPOSABLE) ×3 IMPLANT
STAPLER VISISTAT 35W (STAPLE) IMPLANT
STRIP CLOSURE SKIN 1/2X4 (GAUZE/BANDAGES/DRESSINGS) IMPLANT
SUT ETHILON 2 0 FS 18 (SUTURE) ×3 IMPLANT
SUT MON AB 4-0 PC3 18 (SUTURE) ×3 IMPLANT
SUT VICRYL 3-0 CR8 SH (SUTURE) ×3 IMPLANT
SUT VICRYL AB 3 0 TIES (SUTURE) IMPLANT
SYR CONTROL 10ML LL (SYRINGE) ×3 IMPLANT
TAPE CLOTH SURG 6X10 WHT LF (GAUZE/BANDAGES/DRESSINGS) ×3 IMPLANT
TOWEL OR 17X24 6PK STRL BLUE (TOWEL DISPOSABLE) ×3 IMPLANT
TOWEL OR NON WOVEN STRL DISP B (DISPOSABLE) ×3 IMPLANT
TRAY DSU PREP LF (CUSTOM PROCEDURE TRAY) ×3 IMPLANT
TUBE CONNECTING 20'X1/4 (TUBING) ×1
TUBE CONNECTING 20X1/4 (TUBING) ×2 IMPLANT
YANKAUER SUCT BULB TIP NO VENT (SUCTIONS) ×3 IMPLANT

## 2016-07-06 NOTE — Anesthesia Postprocedure Evaluation (Signed)
Anesthesia Post Note  Patient: Krystal Campbell  Procedure(s) Performed: Procedure(s) (LRB): DRAINAGE ABDOMINAL WALL SEROMA (N/A)  Patient location during evaluation: PACU Anesthesia Type: General Level of consciousness: awake and alert Pain management: pain level controlled Vital Signs Assessment: post-procedure vital signs reviewed and stable Respiratory status: spontaneous breathing, nonlabored ventilation, respiratory function stable and patient connected to nasal cannula oxygen Cardiovascular status: blood pressure returned to baseline and stable Postop Assessment: no signs of nausea or vomiting Anesthetic complications: no       Last Vitals:  Vitals:   07/06/16 0930 07/06/16 0958  BP: 103/76 (!) 120/50  Pulse: 75 76  Resp: 13 17  Temp:  36.9 C    Last Pain:  Vitals:   07/06/16 0958  TempSrc: Oral  PainSc:                  Briannie Gutierrez DAVID

## 2016-07-06 NOTE — Anesthesia Procedure Notes (Signed)
Procedure Name: LMA Insertion Date/Time: 07/06/2016 7:34 AM Performed by: Toula Moos L Pre-anesthesia Checklist: Patient identified, Emergency Drugs available, Suction available, Patient being monitored and Timeout performed Patient Re-evaluated:Patient Re-evaluated prior to inductionOxygen Delivery Method: Circle system utilized Preoxygenation: Pre-oxygenation with 100% oxygen Intubation Type: IV induction Ventilation: Mask ventilation without difficulty LMA: LMA inserted LMA Size: 5.0 Number of attempts: 1 Airway Equipment and Method: Bite block Placement Confirmation: positive ETCO2 Tube secured with: Tape Dental Injury: Teeth and Oropharynx as per pre-operative assessment

## 2016-07-06 NOTE — Anesthesia Preprocedure Evaluation (Signed)
Anesthesia Evaluation  Patient identified by MRN, date of birth, ID band Patient awake    Reviewed: Allergy & Precautions, NPO status , Patient's Chart, lab work & pertinent test results  Airway Mallampati: I  TM Distance: >3 FB Neck ROM: Full    Dental   Pulmonary    Pulmonary exam normal        Cardiovascular hypertension, Pt. on medications Normal cardiovascular exam     Neuro/Psych Anxiety Depression    GI/Hepatic GERD  Controlled and Medicated,  Endo/Other  diabetes, Type 2, Oral Hypoglycemic Agents  Renal/GU      Musculoskeletal   Abdominal   Peds  Hematology   Anesthesia Other Findings   Reproductive/Obstetrics                             Anesthesia Physical Anesthesia Plan  ASA: II  Anesthesia Plan: General   Post-op Pain Management:    Induction: Intravenous  Airway Management Planned: LMA  Additional Equipment:   Intra-op Plan:   Post-operative Plan: Extubation in OR  Informed Consent: I have reviewed the patients History and Physical, chart, labs and discussed the procedure including the risks, benefits and alternatives for the proposed anesthesia with the patient or authorized representative who has indicated his/her understanding and acceptance.     Plan Discussed with: CRNA and Surgeon  Anesthesia Plan Comments:         Anesthesia Quick Evaluation

## 2016-07-06 NOTE — Op Note (Addendum)
Preoperative diagnosis:  Chronic abdominal wall seroma  Postoperative diagnosis: Same  Procedure: Drainage of chronic abdominal wall seroma  Surgeon: [name] Renetta Suman  Anesthesia: Gen. with local  EBL: Minimal  Specimen: Tissue from seroma cavity to pathology  Drains: 17 round Blake drain  Indications for procedure: Patient is a 56 y/o female with a remote history of colon cancer which was stage III treated by right colectomy and postoperative chemotherapy. She had a incisional hernia repair in October 2017. 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 CT scans revealed no recurrence of her hernia. She's had considerable pain sclerotherapy done 2 weeks ago. She continues to have significant drainage from her drain. I recommended surgical drainage of her chronic seroma with removal of her old percutaneous catheter placement of a new catheter. Risks, benefits and alternatives to surgery were discussed with the patient.The procedure has been discussed with the patient.  Alternative therapies have been discussed with the patient.  Operative risks include bleeding,  Infection,  Organ injury,  Nerve injury,  Blood vessel injury,  DVT,  Pulmonary embolism,  Death,  And possible reoperation.  Medical management risks include worsening of present situation.  The success of the procedure is 50 -90 % at treating patients symptoms.  The patient understands and agrees to proceed.    Description of procedure: The patient was met in the holding area and questions are answered. She was taken back to the operating room and placed upon the OR. After induction of general anesthesia, an. Drain was removed. The midline incision where the seroma was located was prepped and draped in a sterile fashion. Timeout was done. She received preoperative antibiotics. The old incision was opened. The catheter tract was evaluated with a hemostat and opened up  the entire sinus tract the subcutaneous fat. Remarkably, there is no residual large seroma cavity just significant white scar tissue. I debrided most of this down to healthy appearing fatty tissue.  No evidence of bowel injury or hernia recurrence.  No exposed mesh was visible.   This was sent to pathology to evaluated. I cut the entire cavity back to healthy fat. This was irrigated. Local anesthetic was infiltrated around the subcutaneous pocket. The area measured about 8 x 7 cm. I did not see any leakage of fluid from the wound or evidence of any other, carrying suture. There is no pus or signs of infection. Through separate stab incision on the right side incision and 19 round drain was placed. Secured to skin with 2-0 nylon. I then closed the subcutaneous tissues with 2-0 Vicryl and 4-0 Monocryl was used to close skin. Dermabond applied. All final counts are found to be correct. The patient was awoke and taken to recovery in satisfactory condition..The [place] narcotic database has been quieried and no conflicts identified.

## 2016-07-06 NOTE — Transfer of Care (Signed)
Immediate Anesthesia Transfer of Care Note  Patient: Krystal Campbell  Procedure(s) Performed: Procedure(s): DRAINAGE ABDOMINAL WALL SEROMA (N/A)  Patient Location: PACU  Anesthesia Type:General  Level of Consciousness: awake and patient cooperative  Airway & Oxygen Therapy: Patient Spontanous Breathing and Patient connected to face mask oxygen  Post-op Assessment: Report given to RN and Post -op Vital signs reviewed and stable  Post vital signs: Reviewed and stable  Last Vitals:  Vitals:   07/06/16 0628 07/06/16 0830  BP: 123/67   Pulse: 87   Resp: 20   Temp: 36.9 C (P) 36.6 C    Last Pain:  Vitals:   07/06/16 0628  TempSrc: Oral  PainSc: 8       Patients Stated Pain Goal: 3 (89/79/15 0413)  Complications: No apparent anesthesia complications

## 2016-07-06 NOTE — H&P (View-Only) (Signed)
Krystal Campbell 07/04/2016 11:02 AM Location: [facility] Patient #: [id number] DOB: May 03, 1960 Married / Language: Krystal Campbell / Race: White Female  History of Present Illness [name] A. Solara [name] MD; 07/04/2016 12:01 PM) Patient words: Patient returns for follow-up of her chronic abdominal wall seroma. She underwent open repair of an incisional hernia at October 2017. She was seen in January had a fluid collection which was drained by interventional radiology. She had a drain for about 3 weeks and the output would not improve. It is serous in nature. She underwent sclerotherapy by interventional radiology about 2 weeks ago. She had considerable pain from this. Her drainage is still well over 80 cc a day but serious. She is quite uncomfortable. She has significant discomfort from the ethanol injection. She's had no redness, fever or chills. Her bowel function overall is remained normal. CT scan done last week shows the catheter in good position. There is no large undrained seroma or signs of infection. She comes in today because the suture broke around her catheter. She is very uncomfortable.  The patient is a 56 year old female.   Allergies ([name], CMA; 07/04/2016 11:03 AM) No Known Drug Allergies 11/17/2014 Allergies Reconciled  Medication History ([name], CMA; 07/04/2016 11:03 AM) OxyCODONE HCl (5MG  Tablet, 1 (one) Tablet Oral every four hours, as needed, Taken starting 06/27/2016) Active. Gabapentin (800MG  Tablet, Oral three times daily) Active. Citalopram Hydrobromide (20MG  Tablet, Oral daily) Active. Fiber (625MG  Tablet, Oral two times daily) Active. Aspirin EC Low Dose (81MG  Tablet DR, Oral daily) Active. Tricor (145MG  Tablet, Oral daily) Active. Melatonin (10MG  Capsule, Oral daily) Active. (Take two capsules at bedtime.) Protonix (20MG  Tablet DR, Oral two times daily) Active. Vitamin B12 (1000MCG Tablet ER, Oral) Active. Medications  Reconciled    Vitals ([name] CMA; 07/04/2016 11:03 AM) 07/04/2016 11:03 AM Weight: 259.6 lb Height: 64in Body Surface Area: 2.19 m Body Mass Index: 44.56 kg/m  Temp.: 99.42F  Pulse: 78 (Regular)  BP: 128/84 (Sitting, Left Arm, Standard)      Physical Exam ([name] [name] MD; 07/04/2016 12:02 PM)  General Note: Teary-eyed  Head and Neck Head-normocephalic, atraumatic with no lesions or palpable masses. Trachea-midline. Thyroid Gland Characteristics - normal size and consistency.  Chest and Lung Exam Chest and lung exam reveals -quiet, even and easy respiratory effort with no use of accessory muscles and on auscultation, normal breath sounds, no adventitious sounds and normal vocal resonance. Inspection Chest Wall - Normal. Back - normal.  Cardiovascular Cardiovascular examination reveals -normal heart sounds, regular rate and rhythm with no murmurs and normal pedal pulses bilaterally.  Abdomen Note: Catheter just below the umbilicus exiting the skin. Side is not red. There is a portion of her incision above this were previous hernia repair was. There is not a large seroma cavity but there is about 100 cc of serous fluid in the bag today. It is tender but not read around the area.  Musculoskeletal Normal Exam - Left-Upper Extremity Strength Normal and Lower Extremity Strength Normal. Normal Exam - Right-Upper Extremity Strength Normal and Lower Extremity Strength Normal.    Assessment & Plan ([name] [name] MD; 07/04/2016 12:02 PM)  ABDOMINAL WALL SEROMA (T88.8XXA) Impression: Patient's had prolonged drainage of this by IR and sclerotherapy 10 days ago. She continues to have about 100 cc of serous drainage. It's unlikely this is going to resolve without surgical intervention at this point in time. I recommended drainage of her seroma in the operating room which is chronic.  Risks, benefits and alternatives to surgery were discussed to  include prolonged drainage with her pigtail catheter. She does [place] of pain from this and desires surgical intervention at this point in time. Risk of bleeding, infection, mesh exposure, mesh infection, recurrent hernia, bowel injury, internal organ injury, death, DVT, and other potential risks were discussed with the patient today as well.  Current Plans Pt Education - [facility] Free Text Education/Instructions: discussed with patient and provided information. The anatomy and the physiology was discussed. The pathophysiology and natural history of the disease was discussed. Options were discussed and recommendations were made. Technique, risks, benefits, & alternatives were discussed. Risks such as stroke, heart attack, bleeding, indection, death, and other risks discussed. Questions answered. The patient agrees to proceed. Pt Education - [facility] Free Text Education/Instructions: discussed with patient and provided information. Pt Education - [facility] Education - Written Instructions given: discussed with patient and provided information.

## 2016-07-06 NOTE — Discharge Instructions (Signed)
Surgical Drain Home Care °Surgical drains are used to remove extra fluid that normally builds up in a surgical wound after surgery. A surgical drain helps to heal a surgical wound. Different kinds of surgical drains include: °· Active drains. These drains use suction to pull drainage away from the surgical wound. Drainage flows through a tube to a container outside of the body. It is important to keep the bulb or the drainage container flat (compressed) at all times, except while you empty it. Flattening the bulb or container creates suction. The two most common types of active drains are bulb drains and Hemovac drains. °· Passive drains. These drains allow fluid to drain naturally, by gravity. Drainage flows through a tube to a bandage (dressing) or a container outside of the body. Passive drains do not need to be emptied. The most common type of passive drain is the Penrose drain. °A drain is placed during surgery. Immediately after surgery, drainage is usually bright red and a little thicker than water. The drainage may gradually turn yellow or pink and become thinner. It is likely that your health care provider will remove the drain when the drainage stops or when the amount decreases to 1-2 Tbsp (15-30 mL) during a 24-hour period. °How to care for your surgical drain °· Keep the skin around the drain dry and covered with a dressing at all times. °· Check your drain area every day for signs of infection. Check for: °¨ More redness, swelling, or pain. °¨ Pus or a bad smell. °¨ Cloudy drainage. °Follow instructions from your health care provider about how to take care of your drain and how to change your dressing. Change your dressing at least one time every day. Change it more often if needed to keep the dressing dry. Make sure you: °1. Gather your supplies, including: °¨ Tape. °¨ Germ-free cleaning solution (sterile saline). °¨ Split gauze drain sponge: 4 x 4 inches (10 x 10 cm). °¨ Gauze square: 4 x 4 inches  (10 x 10 cm). °2. Wash your hands with soap and water before you change your dressing. If soap and water are not available, use hand sanitizer. °3. Remove the old dressing. Avoid using scissors to do that. °4. Use sterile saline to clean your skin around the drain. °5. Place the tube through the slit in a drain sponge. Place the drain sponge so that it covers your wound. °6. Place the gauze square or another drain sponge on top of the drain sponge that is on the wound. Make sure the tube is between those layers. °7. Tape the dressing to your skin. °8. If you have an active bulb or Hemovac drain, tape the drainage tube to your skin 1-2 inches (2.5-5 cm) below the place where the tube enters your body. Taping keeps the tube from pulling on any stitches (sutures) that you have. °9. Wash your hands with soap and water. °10. Write down the color of your drainage and how often you change your dressing. °How to empty your active bulb or Hemovac drain °1. Make sure that you have a measuring cup that you can empty your drainage into. °2. Wash your hands with soap and water. If soap and water are not available, use hand sanitizer. °3. Gently move your fingers down the tube while squeezing very lightly. This is called stripping the tube. This clears any drainage, clots, or tissue from the tube. °¨ Do not pull on the tube. °¨ You may need to strip the tube   several times every day to keep the tube clear. 4. Open the bulb cap or the drain plug. Do not touch the inside of the cap or the bottom of the plug. 5. Empty all of the drainage into the measuring cup. 6. Compress the bulb or the container and replace the cap or the plug. To compress the bulb or the container, squeeze it firmly in the middle while you close the cap or plug the container. 7. Write down the amount of drainage that you have in each 24-hour period. If you have less than 2 Tbsp (30 mL) of drainage during 24 hours, contact your health care provider. 8. Flush  the drainage down the toilet. 9. Wash your hands with soap and water. Contact a health care provider if:  You have more redness, swelling, or pain around your drain area.  The amount of drainage that you have is increasing instead of decreasing.  You have pus or a bad smell coming from your drain area.  You have a fever.  You have drainage that is cloudy.  There is a sudden stop or a sudden decrease in the amount of drainage that you have.  Your tube falls out.  Your active draindoes not stay compressedafter you empty it. This information is not intended to replace advice given to you by your health care provider. Make sure you discuss any questions you have with your health care provider. Document Released: 03/10/2000 Document Revised: 08/19/2015 Document Reviewed: 09/30/2014 Elsevier Interactive Patient Education  2017 [place]: POST OP INSTRUCTIONS  ######################################################################  EAT Gradually transition to a high fiber diet with a fiber supplement over the next few weeks after discharge.  Start with a pureed / full liquid diet (see below)  WALK Walk an hour a day.  Control your pain to do that.    CONTROL PAIN Control pain so that you can walk, sleep, tolerate sneezing/coughing, go up/down stairs.  HAVE A BOWEL MOVEMENT DAILY Keep your bowels regular to avoid problems.  OK to try a laxative to override constipation.  OK to use an antidairrheal to slow down diarrhea.  Call if not better after 2 tries  CALL IF YOU HAVE PROBLEMS/CONCERNS Call if you are still struggling despite following these instructions. Call if you have concerns not answered by these instructions  ######################################################################    1. DIET: Follow a light bland diet the first 24 hours after arrival home, such as soup, liquids, crackers, etc.  Be sure to include lots of fluids daily.  Avoid fast  food or heavy meals as your are more likely to get nauseated.   2. Take your usually prescribed home medications unless otherwise directed. 3. PAIN CONTROL: a. Pain is best controlled by a usual combination of three different methods TOGETHER: i. Ice/Heat ii. Over the counter pain medication iii. Prescription pain medication b. Most patients will experience some swelling and bruising around the incisions.  Ice packs or heating pads (30-60 minutes up to 6 times a day) will help. Use ice for the first few days to help decrease swelling and bruising, then switch to heat to help relax tight/sore spots and speed recovery.  Some people prefer to use ice alone, heat alone, alternating between ice & heat.  Experiment to what works for you.  Swelling and bruising can take several weeks to resolve.   c. It is helpful to take an over-the-counter pain medication regularly for the first few weeks.  Choose one of the following that  works best for you: i. Naproxen (Aleve, etc)  Two 220mg  tabs twice a day ii. Ibuprofen (Advil, etc) Three 200mg  tabs four times a day (every meal & bedtime) iii. Acetaminophen (Tylenol, etc) 500-650mg  four times a day (every meal & bedtime) d. A  prescription for pain medication (such as oxycodone, hydrocodone, etc) should be given to you upon discharge.  Take your pain medication as prescribed.  i. If you are having problems/concerns with the prescription medicine (does not control pain, nausea, vomiting, rash, itching, etc), please call us [phone] to see if we need to switch you to a different pain medicine that will work better for you and/or control your side effect better. ii. If you need a refill on your pain medication, please contact your pharmacy.  They will contact our office to request authorization. Prescriptions will not be filled after 5 pm or on week-ends. 4. Avoid getting constipated.  Between the surgery and the pain medications, it is common to experience some  constipation.  Increasing fluid intake and taking a fiber supplement (such as Metamucil, Citrucel, FiberCon, MiraLax, etc) 1-2 times a day regularly will usually help prevent this problem from occurring.  A mild laxative (prune juice, Milk of Magnesia, MiraLax, etc) should be taken according to package directions if there are no bowel movements after 48 hours.   5. Wash / shower every day.  You may shower over the dressings as they are waterproof.  Continue to shower over incision(s) after the dressing is off. 6. Remove your waterproof bandages 5 days after surgery.  You may leave the incision open to air.  You may have skin tapes (Steri Strips) covering the incision(s).  Leave them on until one week, then remove.  You may replace a dressing/Band-Aid to cover the incision for comfort if you wish.      7. ACTIVITIES as tolerated:   a. You may resume regular (light) daily activities beginning the next day--such as daily self-care, walking, climbing stairs--gradually increasing activities as tolerated.  If you can walk 30 minutes without difficulty, it is safe to try more intense activity such as jogging, treadmill, bicycling, low-impact aerobics, swimming, etc. b. Save the most intensive and strenuous activity for last such as sit-ups, heavy lifting, contact sports, etc  Refrain from any heavy lifting or straining until you are off narcotics for pain control.   c. DO NOT PUSH THROUGH PAIN.  Let pain be your guide: If it hurts to do something, don't do it.  Pain is your body warning you to avoid that activity for another week until the pain goes down. d. You may drive when you are no longer taking prescription pain medication, you can comfortably wear a seatbelt, and you can safely maneuver your car and apply brakes. e. Dennis Bast may have sexual intercourse when it is comfortable.  8. FOLLOW UP in our office a. Please call [facility] at [phone] to set up an appointment to see your surgeon in the office for a  follow-up appointment approximately 2-3 weeks after your surgery. b. Make sure that you call for this appointment the day you arrive home to insure a convenient appointment time. 9. IF YOU HAVE DISABILITY OR FAMILY LEAVE FORMS, BRING THEM TO THE OFFICE FOR PROCESSING.  DO NOT GIVE THEM TO YOUR DOCTOR.   WHEN TO CALL us [phone]: 1. Poor pain control 2. Reactions / problems with new medications (rash/itching, nausea, etc)  3. Fever over 101.5 F (38.5 C) 4. Worsening swelling  or bruising 5. Continued bleeding from incision. 6. Increased pain, redness, or drainage from the incision 7. Difficulty breathing / swallowing   The clinic staff is available to answer your questions during regular business hours (8:30am-5pm).  Please dont hesitate to call and ask to speak to one of our nurses for clinical concerns.   If you have a medical emergency, go to the nearest emergency room or call 911.  A surgeon from [facility] is always on call at the [facility], [place], [place], [place], [place]  [address] ? MAIN: [phone] ? TOLL FREE: [phone] ?  FAX [phone] [email or web address]      Post Anesthesia Home Care Instructions  Activity: Get plenty of rest for the remainder of the day. A responsible individual must stay with you for 24 hours following the procedure.  For the next 24 hours, DO NOT: -Drive a car -Paediatric nurse -Drink alcoholic beverages -Take any medication unless instructed by your physician -Make any legal decisions or sign important papers.  Meals: Start with liquid foods such as gelatin or soup. Progress to regular foods as tolerated. Avoid greasy, spicy, heavy foods. If nausea and/or vomiting occur, drink only clear liquids until the nausea and/or vomiting subsides. Call your physician if vomiting continues.  Special Instructions/Symptoms: Your throat may feel dry or sore from  the anesthesia or the breathing tube placed in your throat during surgery. If this causes discomfort, gargle with warm salt water. The discomfort should disappear within 24 hours.  If you had a scopolamine patch placed behind your ear for the management of post- operative nausea and/or vomiting:  1. The medication in the patch is effective for 72 hours, after which it should be removed.  Wrap patch in a tissue and discard in the trash. Wash hands thoroughly with soap and water. 2. You may remove the patch earlier than 72 hours if you experience unpleasant side effects which may include dry mouth, dizziness or visual disturbances. 3. Avoid touching the patch. Wash your hands with soap and water after contact with the patch.

## 2016-07-06 NOTE — Interval H&P Note (Signed)
History and Physical Interval Note:  07/06/2016 7:14 AM  Krystal Campbell  has presented today for surgery, with the diagnosis of ABDOMINAL WALL SEROMA  The various methods of treatment have been discussed with the patient and family. After consideration of risks, benefits and other options for treatment, the patient has consented to  Procedure(s): DRAINAGE ABDOMINAL WALL SEROMA (N/A) as a surgical intervention .  The patient's history has been reviewed, patient examined, no change in status, stable for surgery.  I have reviewed the patient's chart and labs.  Questions were answered to the patient's satisfaction.     Toma Erichsen A.

## 2016-07-07 ENCOUNTER — Encounter (HOSPITAL_BASED_OUTPATIENT_CLINIC_OR_DEPARTMENT_OTHER): Payer: Self-pay | Admitting: Surgery

## 2016-07-25 ENCOUNTER — Other Ambulatory Visit: Payer: Self-pay | Admitting: Surgery

## 2016-07-25 DIAGNOSIS — S301XXA Contusion of abdominal wall, initial encounter: Secondary | ICD-10-CM

## 2016-07-28 ENCOUNTER — Ambulatory Visit
Admission: RE | Admit: 2016-07-28 | Discharge: 2016-07-28 | Disposition: A | Discharge status: Home / Self Care | Payer: Medicare PPO | Source: Ambulatory Visit | Attending: Surgery | Admitting: Surgery

## 2016-07-28 DIAGNOSIS — S301XXA Contusion of abdominal wall, initial encounter: Secondary | ICD-10-CM

## 2016-07-28 MED ORDER — IOPAMIDOL (ISOVUE-300) INJECTION 61%
125.0000 mL | Freq: Once | INTRAVENOUS | Status: AC | PRN
  Administered 2016-07-28: 125 mL via INTRAVENOUS

## 2016-08-01 ENCOUNTER — Ambulatory Visit: Payer: Self-pay | Admitting: Surgery

## 2016-08-01 NOTE — H&P (Signed)
Krystal Campbell 08/01/2016 11:39 AM Location: [facility] Patient #: [id number] DOB: 04-Oct-1960 Married / Language: [name] / Race: White Female  History of Present Illness [name] A. Jaleea Alesi MD; 08/01/2016 12:21 PM) Patient words: Patient returns for follow-up after seroma drainage from a previous hernia surgery several months ago. She had the area excised which did not show any pathology. She's developed intermittent cellulitis and this is improved on Bactrim. I sent her for a CT scan which shows recurrence of the seroma cavity. Her mesh appears to be intact but could be involved as well. She is feeling much better today. She still has pain at the site but her pain is controlled with pain medication which she takes intermittently. Her drain sites getting inflamed. She still putting out about 80 cc a day from this drain which is serous in nature. CT scan showed no communication with her GI tract.   Labs show a normal WBC           CLINICAL DATA: 56 year old female with an anterior abdominal wall seroma following ventral hernia repair with mesh treated by percutaneous drain placement and alcohol sclerosis which was unsuccessful. The patient underwent open operative the drainage of the seroma and placement of a surgical drain on 07/06/2016.  EXAM: CT ABDOMEN AND PELVIS WITH CONTRAST  TECHNIQUE: Multidetector CT imaging of the abdomen and pelvis was performed using the standard protocol following bolus administration of intravenous contrast.  CONTRAST: 130mL ISOVUE-300 IOPAMIDOL (ISOVUE-300) INJECTION 61%  COMPARISON: Prior CT scan of the abdomen and pelvis 06/27/2016  FINDINGS: Lower chest: The lung bases are clear. Visualized cardiac structures are within normal limits for size. No pericardial effusion. Unremarkable visualized distal thoracic esophagus.  Hepatobiliary: Normal hepatic contour and morphology. No discrete hepatic lesions. Normal appearance of  the gallbladder. No intra or extrahepatic biliary ductal dilatation.  Pancreas: Unremarkable. No pancreatic ductal dilatation or surrounding inflammatory changes.  Spleen: Punctate calcifications consistent with old granulomatous disease.  Adrenals/Urinary Tract: The adrenal glands are normal in appearance. Normal appearance of the left kidney. Surgical changes suggest prior partial nephrectomy on the right. No evidence of hydronephrosis or enhancing renal mass.  Stomach/Bowel: Colonic diverticular disease without CT evidence of active inflammation. No focal bowel wall thickening or evidence of obstruction.  Vascular/Lymphatic: No significant vascular findings are present. No enlarged abdominal or pelvic lymph nodes.  Reproductive: Uterus and bilateral adnexa are unremarkable. Bilateral tubal ligation clips are present.  Other: The previously placed percutaneous drainage catheter is no longer present. A surgically placed drainage catheter is present in the superficial subcutaneous fat. Just deep to the [facility] drain there is recurrence of the seroma measuring approximately 12.9 x 6.3 x 6.3 cm. The fluid collection is capsulated by a relatively thick and enhancing wall and the fluid contains a few small locules of gas.  Musculoskeletal: No acute osseous abnormality.  IMPRESSION: 1. Recurrence of a seroma (or, in the appropriate clinical setting, abscess) just deep to the anterior fascia of the abdominal wall. The seroma measures 12.9 x 6.3 x 6.3 cm and contains a few locules of internal gas which may be related to the presence of the surgical drain, or may denote infection with a gas-forming organism. Of note, the fluid collection is encapsulated by a relatively thick and enhancing rind compared to the the previously drained and excised seroma. 2. The surgically placed ablate drain appears superficial to the recurrent fluid collection. 3. Additional ancillary findings as  above without significant interval change. These results will  be called to the ordering clinician or representative by the Radiologist Assistant, and communication documented in the PACS or zVision Dashboard.   Electronically Signed By: Jacqulynn Cadet M.D. On: 07/28/2016 15:34.  The patient is a 56 year old female.   Allergies [name], [place]; 08/01/2016 11:39 AM) No Known Allergies 08/01/2016  Medication History (Jamillia Closson A. Kiffany Schelling, MD; 08/01/2016 12:03 PM) OxyCODONE HCl (5MG  Tablet, 1 (one) Tablet Oral every four hours, as needed, Taken starting 07/17/2016) Active. Bactrim DS (800-160MG  Tablet, 1 (one) Tablet Oral two times daily, Taken starting 07/25/2016) Active. Zofran ODT (8MG  Tablet Disint, 1 (one) Tablet Oral every six hours, as needed, Taken starting 07/25/2016) Active. Vitamin B12 (1000MCG Tablet ER, Oral) Active. Gabapentin (800MG  Tablet, Oral three times daily) Active. Citalopram Hydrobromide (20MG  Tablet, Oral daily) Active. Fiber (625MG  Tablet, Oral two times daily) Active. Aspirin EC Low Dose (81MG  Tablet DR, Oral daily) Active. Tricor (145MG  Tablet, Oral daily) Active. Melatonin (10MG  Capsule, Oral daily) Active. (Take two capsules at bedtime.) Protonix (20MG  Tablet DR, Oral two times daily) Active. Medications Reconciled    Vitals [name] RMA; 08/01/2016 11:41 AM) 08/01/2016 11:40 AM Weight: 249 lb Height: 64in Body Surface Area: 2.15 m Body Mass Index: 42.74 kg/m  Temp.: 98.59F  Pulse: 99 (Regular)  BP: 120/80 (Sitting, Left Arm, Standard)      Physical Exam (Elica Almas A. Allena Pietila MD; 08/01/2016 12:17 PM)  Abdomen Note: Drain with serous drainage. There is about 20 cc in the bulb. The incision is closed without drainage. There is some irritation where the suture holds a drain in place. There is a mild amount of redness there. No overt cellulitis. She still tender to palpation in the area.    Assessment & Plan  (Inesha Sow A. 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 The drain on CT scan is on the most superficial aspect of this fluid cavity but unfortunately is not evacuating it off. I think she developed an area of sclerosis from the alcohol injection but this was not enough alcohol to sclerose the entire cavity. Therefore, the second fluid collection is probably related to the first but separated by the scarring from the ethanol injection. The mesh appears to be intact but I do have concerns that mesh may be involved in portal problem in her case since this will not resolve despite previous treatments. I recommended talc application to the seroma cavity in the operating room. I also could reevaluate to see if her mesh is involved which does not appear to be the case last time she was in surgery a do have concerns this is causing this problem not to resolve. She may require mesh explantation and primary closure. Fortunately the hernia was a small hernia and this can be done by her recurrence rates are higher with this technique which she understands. Hopefully the mesh can be left in place as before the talc be applied this process will resolve and I explained to her we may have to remove the mesh.   Risk of bleeding infection recurrence bowel injury recurrent hernia pain DVT death cardiac complications and more surgery  Current Plans Pt Education - NCCSRS: no at risk use: discussed with patient and provided information.

## 2016-08-03 ENCOUNTER — Encounter (HOSPITAL_BASED_OUTPATIENT_CLINIC_OR_DEPARTMENT_OTHER): Payer: Self-pay | Admitting: *Deleted

## 2016-08-03 NOTE — Progress Notes (Signed)
Bring all medications. Pack an overnight bag. Requested Labs that Dr. Brantley Stage had drawn at his office last week.

## 2016-08-09 ENCOUNTER — Encounter (HOSPITAL_BASED_OUTPATIENT_CLINIC_OR_DEPARTMENT_OTHER)
Admission: RE | Disposition: A | Discharge status: Home / Self Care | Payer: Self-pay | Source: Ambulatory Visit | Attending: Surgery

## 2016-08-09 ENCOUNTER — Ambulatory Visit (HOSPITAL_BASED_OUTPATIENT_CLINIC_OR_DEPARTMENT_OTHER)
Admission: RE | Admit: 2016-08-09 | Discharge: 2016-08-10 | Disposition: A | Discharge status: Home / Self Care | Payer: Medicare PPO | Source: Ambulatory Visit | Attending: Surgery | Admitting: Surgery

## 2016-08-09 ENCOUNTER — Ambulatory Visit (HOSPITAL_BASED_OUTPATIENT_CLINIC_OR_DEPARTMENT_OTHER): Payer: Medicare PPO | Admitting: Certified Registered"

## 2016-08-09 ENCOUNTER — Encounter (HOSPITAL_BASED_OUTPATIENT_CLINIC_OR_DEPARTMENT_OTHER): Payer: Self-pay | Admitting: Anesthesiology

## 2016-08-09 DIAGNOSIS — Z85038 Personal history of other malignant neoplasm of large intestine: Secondary | ICD-10-CM | POA: Diagnosis not present

## 2016-08-09 DIAGNOSIS — Z79891 Long term (current) use of opiate analgesic: Secondary | ICD-10-CM | POA: Insufficient documentation

## 2016-08-09 DIAGNOSIS — K219 Gastro-esophageal reflux disease without esophagitis: Secondary | ICD-10-CM | POA: Diagnosis not present

## 2016-08-09 DIAGNOSIS — E119 Type 2 diabetes mellitus without complications: Secondary | ICD-10-CM | POA: Diagnosis not present

## 2016-08-09 DIAGNOSIS — Z9889 Other specified postprocedural states: Secondary | ICD-10-CM | POA: Insufficient documentation

## 2016-08-09 DIAGNOSIS — I10 Essential (primary) hypertension: Secondary | ICD-10-CM | POA: Insufficient documentation

## 2016-08-09 DIAGNOSIS — Z6841 Body Mass Index (BMI) 40.0 and over, adult: Secondary | ICD-10-CM | POA: Diagnosis not present

## 2016-08-09 DIAGNOSIS — Z7984 Long term (current) use of oral hypoglycemic drugs: Secondary | ICD-10-CM | POA: Insufficient documentation

## 2016-08-09 DIAGNOSIS — Z9049 Acquired absence of other specified parts of digestive tract: Secondary | ICD-10-CM | POA: Diagnosis not present

## 2016-08-09 DIAGNOSIS — Z90710 Acquired absence of both cervix and uterus: Secondary | ICD-10-CM | POA: Insufficient documentation

## 2016-08-09 DIAGNOSIS — L7634 Postprocedural seroma of skin and subcutaneous tissue following other procedure: Secondary | ICD-10-CM | POA: Insufficient documentation

## 2016-08-09 DIAGNOSIS — Z87891 Personal history of nicotine dependence: Secondary | ICD-10-CM | POA: Insufficient documentation

## 2016-08-09 DIAGNOSIS — Z79899 Other long term (current) drug therapy: Secondary | ICD-10-CM | POA: Diagnosis not present

## 2016-08-09 DIAGNOSIS — Z96651 Presence of right artificial knee joint: Secondary | ICD-10-CM | POA: Insufficient documentation

## 2016-08-09 HISTORY — PX: INCISION AND DRAINAGE ABSCESS: SHX5864

## 2016-08-09 LAB — GLUCOSE, CAPILLARY
GLUCOSE-CAPILLARY: 146 mg/dL — AB (ref 65–99)
Glucose-Capillary: 142 mg/dL — ABNORMAL HIGH (ref 65–99)
Glucose-Capillary: 217 mg/dL — ABNORMAL HIGH (ref 65–99)
Glucose-Capillary: 136 mg/dL — ABNORMAL HIGH (ref 65–99)

## 2016-08-09 SURGERY — INCISION AND DRAINAGE, ABSCESS
Anesthesia: General | Site: Abdomen

## 2016-08-09 MED ORDER — SODIUM CHLORIDE 0.9% FLUSH
3.0000 mL | Freq: Two times a day (BID) | INTRAVENOUS | Status: DC
  Administered 2016-08-09: 3 mL via INTRAVENOUS

## 2016-08-09 MED ORDER — HYDROMORPHONE HCL 1 MG/ML IJ SOLN
INTRAMUSCULAR | Status: AC
  Filled 2016-08-09: qty 1

## 2016-08-09 MED ORDER — MIDAZOLAM HCL 5 MG/5ML IJ SOLN
INTRAMUSCULAR | Status: DC | PRN
  Administered 2016-08-09: 2 mg via INTRAVENOUS

## 2016-08-09 MED ORDER — PROPOFOL 10 MG/ML IV BOLUS
INTRAVENOUS | Status: AC
  Filled 2016-08-09: qty 20

## 2016-08-09 MED ORDER — FENTANYL CITRATE (PF) 100 MCG/2ML IJ SOLN
INTRAMUSCULAR | Status: AC
  Filled 2016-08-09: qty 2

## 2016-08-09 MED ORDER — ACETAMINOPHEN 500 MG PO TABS
1000.0000 mg | ORAL_TABLET | ORAL | Status: AC
  Administered 2016-08-09: 1000 mg via ORAL

## 2016-08-09 MED ORDER — ACETAMINOPHEN 500 MG PO TABS
ORAL_TABLET | ORAL | Status: AC
  Filled 2016-08-09: qty 2

## 2016-08-09 MED ORDER — KETOROLAC TROMETHAMINE 30 MG/ML IJ SOLN
30.0000 mg | Freq: Four times a day (QID) | INTRAMUSCULAR | Status: DC
  Administered 2016-08-09 – 2016-08-10 (×3): 30 mg via INTRAVENOUS
  Filled 2016-08-09 (×3): qty 1

## 2016-08-09 MED ORDER — MEPERIDINE HCL 25 MG/ML IJ SOLN: 6.2500 mg | INTRAMUSCULAR | Status: DC | PRN

## 2016-08-09 MED ORDER — TALC (STERITALC) POWDER FOR INTRAPLEURAL USE
4.0000 g | Freq: Once | INTRAPLEURAL | Status: DC
Start: 2016-08-09 — End: 2016-08-10
  Filled 2016-08-09: qty 4

## 2016-08-09 MED ORDER — DEXAMETHASONE SODIUM PHOSPHATE 10 MG/ML IJ SOLN
INTRAMUSCULAR | Status: AC
Start: 2016-08-09 — End: 2016-08-09
  Filled 2016-08-09: qty 1

## 2016-08-09 MED ORDER — KETOROLAC TROMETHAMINE 30 MG/ML IJ SOLN
30.0000 mg | Freq: Once | INTRAMUSCULAR | Status: AC
  Administered 2016-08-09: 30 mg via INTRAVENOUS

## 2016-08-09 MED ORDER — KETOROLAC TROMETHAMINE 30 MG/ML IJ SOLN
INTRAMUSCULAR | Status: AC
  Filled 2016-08-09: qty 1

## 2016-08-09 MED ORDER — FENTANYL CITRATE (PF) 100 MCG/2ML IJ SOLN
25.0000 ug | INTRAMUSCULAR | Status: DC | PRN
  Administered 2016-08-09 (×3): 50 ug via INTRAVENOUS

## 2016-08-09 MED ORDER — CHLORHEXIDINE GLUCONATE CLOTH 2 % EX PADS: 6.0000 | MEDICATED_PAD | Freq: Once | CUTANEOUS | Status: DC

## 2016-08-09 MED ORDER — FENTANYL CITRATE (PF) 100 MCG/2ML IJ SOLN
INTRAMUSCULAR | Status: DC | PRN
  Administered 2016-08-09: 100 ug via INTRAVENOUS

## 2016-08-09 MED ORDER — INSULIN ASPART 100 UNIT/ML ~~LOC~~ SOLN
0.0000 [IU] | Freq: Three times a day (TID) | SUBCUTANEOUS | Status: DC
  Administered 2016-08-09: 2 [IU] via SUBCUTANEOUS
  Administered 2016-08-09: 5 [IU] via SUBCUTANEOUS
  Filled 2016-08-09 (×2): qty 1

## 2016-08-09 MED ORDER — ACETAMINOPHEN 325 MG PO TABS
650.0000 mg | ORAL_TABLET | ORAL | Status: DC | PRN
  Administered 2016-08-09 – 2016-08-10 (×3): 650 mg via ORAL
  Filled 2016-08-09 (×3): qty 2

## 2016-08-09 MED ORDER — HYDROMORPHONE HCL 1 MG/ML IJ SOLN
0.2500 mg | INTRAMUSCULAR | Status: DC | PRN
  Administered 2016-08-09: 0.5 mg via INTRAVENOUS
  Administered 2016-08-09 (×2): 0.25 mg via INTRAVENOUS

## 2016-08-09 MED ORDER — ONDANSETRON HCL 4 MG/2ML IJ SOLN
INTRAMUSCULAR | Status: DC | PRN
  Administered 2016-08-09: 4 mg via INTRAVENOUS

## 2016-08-09 MED ORDER — LIDOCAINE HCL (CARDIAC) 20 MG/ML IV SOLN
INTRAVENOUS | Status: DC | PRN
  Administered 2016-08-09: 30 mg via INTRAVENOUS

## 2016-08-09 MED ORDER — TALC 5 G PL SUSR
INTRAPLEURAL | Status: DC | PRN
  Administered 2016-08-09: 4 g via INTRAPLEURAL

## 2016-08-09 MED ORDER — OXYCODONE HCL 5 MG PO TABS
5.0000 mg | ORAL_TABLET | ORAL | Status: DC | PRN
  Administered 2016-08-09 – 2016-08-10 (×5): 10 mg via ORAL
  Filled 2016-08-09 (×5): qty 2

## 2016-08-09 MED ORDER — CEFAZOLIN SODIUM-DEXTROSE 2-4 GM/100ML-% IV SOLN
INTRAVENOUS | Status: AC
  Filled 2016-08-09: qty 200

## 2016-08-09 MED ORDER — CEFAZOLIN SODIUM 10 G IJ SOLR
3.0000 g | INTRAMUSCULAR | Status: AC
  Administered 2016-08-09: 3 g via INTRAVENOUS

## 2016-08-09 MED ORDER — CELECOXIB 200 MG PO CAPS
ORAL_CAPSULE | ORAL | Status: AC
  Filled 2016-08-09: qty 1

## 2016-08-09 MED ORDER — GABAPENTIN 300 MG PO CAPS
300.0000 mg | ORAL_CAPSULE | ORAL | Status: AC
  Administered 2016-08-09: 300 mg via ORAL

## 2016-08-09 MED ORDER — GABAPENTIN 800 MG PO TABS
800.0000 mg | ORAL_TABLET | Freq: Three times a day (TID) | ORAL | Status: DC
  Administered 2016-08-09 (×2): 800 mg via ORAL

## 2016-08-09 MED ORDER — MIDAZOLAM HCL 2 MG/2ML IJ SOLN
INTRAMUSCULAR | Status: AC
  Filled 2016-08-09: qty 2

## 2016-08-09 MED ORDER — LACTATED RINGERS IV SOLN: INTRAVENOUS | Status: DC

## 2016-08-09 MED ORDER — OXYCODONE HCL 5 MG PO TABS: 5.0000 mg | ORAL_TABLET | Freq: Four times a day (QID) | ORAL | 0 refills | Status: DC | PRN

## 2016-08-09 MED ORDER — FENOFIBRATE 54 MG PO TABS: 54.0000 mg | ORAL_TABLET | Freq: Every day | ORAL | Status: DC

## 2016-08-09 MED ORDER — PROPOFOL 10 MG/ML IV BOLUS
INTRAVENOUS | Status: DC | PRN
  Administered 2016-08-09: 50 mg via INTRAVENOUS
  Administered 2016-08-09: 30 mg via INTRAVENOUS

## 2016-08-09 MED ORDER — CELECOXIB 400 MG PO CAPS
400.0000 mg | ORAL_CAPSULE | ORAL | Status: AC
  Administered 2016-08-09: 400 mg via ORAL

## 2016-08-09 MED ORDER — METFORMIN HCL 500 MG PO TABS
1000.0000 mg | ORAL_TABLET | Freq: Two times a day (BID) | ORAL | Status: DC
  Administered 2016-08-09 (×2): 1000 mg via ORAL

## 2016-08-09 MED ORDER — IBUPROFEN 800 MG PO TABS: 800.0000 mg | ORAL_TABLET | Freq: Three times a day (TID) | ORAL | 0 refills | Status: DC | PRN

## 2016-08-09 MED ORDER — PHENYLEPHRINE 40 MCG/ML (10ML) SYRINGE FOR IV PUSH (FOR BLOOD PRESSURE SUPPORT)
PREFILLED_SYRINGE | INTRAVENOUS | Status: AC
  Filled 2016-08-09: qty 10

## 2016-08-09 MED ORDER — TALC (STERITALC) POWDER FOR INTRAPLEURAL USE: 4.0000 g | Freq: Once | INTRAPLEURAL | Status: DC

## 2016-08-09 MED ORDER — PANTOPRAZOLE SODIUM 40 MG PO TBEC
40.0000 mg | DELAYED_RELEASE_TABLET | Freq: Two times a day (BID) | ORAL | Status: DC
  Administered 2016-08-09: 40 mg via ORAL
  Filled 2016-08-09: qty 1

## 2016-08-09 MED ORDER — DEXAMETHASONE SODIUM PHOSPHATE 4 MG/ML IJ SOLN: INTRAMUSCULAR | Status: DC | PRN

## 2016-08-09 MED ORDER — LACTATED RINGERS IV SOLN
INTRAVENOUS | Status: DC
  Administered 2016-08-09 (×2): via INTRAVENOUS

## 2016-08-09 MED ORDER — LIDOCAINE 2% (20 MG/ML) 5 ML SYRINGE
INTRAMUSCULAR | Status: AC
  Filled 2016-08-09: qty 5

## 2016-08-09 MED ORDER — PHENYLEPHRINE HCL 10 MG/ML IJ SOLN
INTRAMUSCULAR | Status: DC | PRN
  Administered 2016-08-09 (×3): 80 ug via INTRAVENOUS

## 2016-08-09 MED ORDER — ACETAMINOPHEN 650 MG RE SUPP: 650.0000 mg | RECTAL | Status: DC | PRN

## 2016-08-09 MED ORDER — SODIUM CHLORIDE 0.9% FLUSH: 3.0000 mL | INTRAVENOUS | Status: DC | PRN

## 2016-08-09 MED ORDER — SODIUM CHLORIDE 0.9 % IV SOLN
250.0000 mL | INTRAVENOUS | Status: DC | PRN
  Administered 2016-08-09: 20 mL/h via INTRAVENOUS

## 2016-08-09 MED ORDER — 0.9 % SODIUM CHLORIDE (POUR BTL) OPTIME
TOPICAL | Status: DC | PRN
  Administered 2016-08-09: 1000 mL

## 2016-08-09 MED ORDER — FENTANYL CITRATE (PF) 100 MCG/2ML IJ SOLN: 25.0000 ug | INTRAMUSCULAR | Status: DC | PRN

## 2016-08-09 MED ORDER — METOCLOPRAMIDE HCL 5 MG/ML IJ SOLN: 10.0000 mg | Freq: Once | INTRAMUSCULAR | Status: DC | PRN

## 2016-08-09 MED ORDER — EPHEDRINE 5 MG/ML INJ
INTRAVENOUS | Status: AC
  Filled 2016-08-09: qty 10

## 2016-08-09 MED ORDER — GABAPENTIN 300 MG PO CAPS
ORAL_CAPSULE | ORAL | Status: AC
  Filled 2016-08-09: qty 1

## 2016-08-09 MED ORDER — ONDANSETRON HCL 4 MG/2ML IJ SOLN
INTRAMUSCULAR | Status: AC
  Filled 2016-08-09: qty 2

## 2016-08-09 MED ORDER — BUPIVACAINE HCL (PF) 0.25 % IJ SOLN
INTRAMUSCULAR | Status: DC | PRN
  Administered 2016-08-09: 20 mL

## 2016-08-09 MED ORDER — FENTANYL CITRATE (PF) 100 MCG/2ML IJ SOLN: 50.0000 ug | INTRAMUSCULAR | Status: DC | PRN

## 2016-08-09 MED ORDER — MIDAZOLAM HCL 2 MG/2ML IJ SOLN: 1.0000 mg | INTRAMUSCULAR | Status: DC | PRN

## 2016-08-09 MED ORDER — SCOPOLAMINE 1 MG/3DAYS TD PT72: 1.0000 | MEDICATED_PATCH | Freq: Once | TRANSDERMAL | Status: DC | PRN

## 2016-08-09 MED ORDER — SULFAMETHOXAZOLE-TRIMETHOPRIM 800-160 MG PO TABS: 1.0000 | ORAL_TABLET | Freq: Two times a day (BID) | ORAL | Status: DC

## 2016-08-09 MED ORDER — EPHEDRINE SULFATE 50 MG/ML IJ SOLN
INTRAMUSCULAR | Status: DC | PRN
  Administered 2016-08-09: 10 mg via INTRAVENOUS

## 2016-08-09 SURGICAL SUPPLY — 36 items
BLADE HEX COATED 2.75 (ELECTRODE) ×3 IMPLANT
BLADE SURG 10 STRL SS (BLADE) ×3 IMPLANT
CANISTER SUCT 1200ML W/VALVE (MISCELLANEOUS) ×3 IMPLANT
COVER BACK TABLE 60X90IN (DRAPES) ×3 IMPLANT
COVER MAYO STAND STRL (DRAPES) ×3 IMPLANT
DRAIN CHANNEL 19F RND (DRAIN) ×3 IMPLANT
DRAPE LAPAROSCOPIC ABDOMINAL (DRAPES) ×3 IMPLANT
ELECT REM PT RETURN 9FT ADLT (ELECTROSURGICAL) ×3
ELECTRODE REM PT RTRN 9FT ADLT (ELECTROSURGICAL) ×1 IMPLANT
EVACUATOR SILICONE 100CC (DRAIN) ×3 IMPLANT
GLOVE BIOGEL PI IND STRL 7.0 (GLOVE) ×2 IMPLANT
GLOVE BIOGEL PI IND STRL 8 (GLOVE) ×1 IMPLANT
GLOVE BIOGEL PI INDICATOR 7.0 (GLOVE) ×4
GLOVE BIOGEL PI INDICATOR 8 (GLOVE) ×2
GLOVE ECLIPSE 6.5 STRL STRAW (GLOVE) ×3 IMPLANT
GLOVE ECLIPSE 8.0 STRL XLNG CF (GLOVE) ×3 IMPLANT
GOWN STRL REUS W/ TWL LRG LVL3 (GOWN DISPOSABLE) ×2 IMPLANT
GOWN STRL REUS W/TWL LRG LVL3 (GOWN DISPOSABLE) ×4
LIQUID BAND (GAUZE/BANDAGES/DRESSINGS) ×3 IMPLANT
NEEDLE HYPO 25X1 1.5 SAFETY (NEEDLE) ×3 IMPLANT
NS IRRIG 1000ML POUR BTL (IV SOLUTION) ×3 IMPLANT
PACK BASIN DAY SURGERY FS (CUSTOM PROCEDURE TRAY) ×3 IMPLANT
PENCIL BUTTON HOLSTER BLD 10FT (ELECTRODE) ×3 IMPLANT
SPONGE GAUZE 2X2 8PLY STER LF (GAUZE/BANDAGES/DRESSINGS) ×1
SPONGE GAUZE 2X2 8PLY STRL LF (GAUZE/BANDAGES/DRESSINGS) ×2 IMPLANT
SPONGE LAP 4X18 X RAY DECT (DISPOSABLE) ×3 IMPLANT
SUT ETHILON 2 0 FS 18 (SUTURE) ×3 IMPLANT
SUT MON AB 4-0 PC3 18 (SUTURE) ×3 IMPLANT
SUT VICRYL 3-0 CR8 SH (SUTURE) ×3 IMPLANT
SYR CONTROL 10ML LL (SYRINGE) ×3 IMPLANT
TAPE CLOTH SURG 4X10 WHT LF (GAUZE/BANDAGES/DRESSINGS) ×3 IMPLANT
TOWEL OR 17X24 6PK STRL BLUE (TOWEL DISPOSABLE) ×3 IMPLANT
TRAY DSU PREP LF (CUSTOM PROCEDURE TRAY) ×3 IMPLANT
TUBE CONNECTING 20'X1/4 (TUBING) ×1
TUBE CONNECTING 20X1/4 (TUBING) ×2 IMPLANT
YANKAUER SUCT BULB TIP NO VENT (SUCTIONS) ×3 IMPLANT

## 2016-08-09 NOTE — Anesthesia Preprocedure Evaluation (Signed)
Anesthesia Evaluation  Patient identified by MRN, date of birth, ID band Patient awake    Reviewed: Allergy & Precautions, NPO status , Patient's Chart, lab work & pertinent test results  Airway Mallampati: I  TM Distance: >3 FB Neck ROM: Full    Dental no notable dental hx.    Pulmonary former smoker,    Pulmonary exam normal breath sounds clear to auscultation       Cardiovascular hypertension, Pt. on medications Normal cardiovascular exam Rhythm:Regular Rate:Normal     Neuro/Psych Anxiety Depression negative neurological ROS     GI/Hepatic Neg liver ROS, hiatal hernia, GERD  Controlled and Medicated,  Endo/Other  diabetes, Type 2, Oral Hypoglycemic AgentsMorbid obesity  Renal/GU negative Renal ROS  negative genitourinary   Musculoskeletal negative musculoskeletal ROS (+)   Abdominal   Peds negative pediatric ROS (+)  Hematology negative hematology ROS (+)   Anesthesia Other Findings   Reproductive/Obstetrics negative OB ROS                             Anesthesia Physical  Anesthesia Plan  ASA: II  Anesthesia Plan: General   Post-op Pain Management:    Induction: Intravenous  Airway Management Planned: LMA  Additional Equipment:   Intra-op Plan:   Post-operative Plan: Extubation in OR  Informed Consent: I have reviewed the patients History and Physical, chart, labs and discussed the procedure including the risks, benefits and alternatives for the proposed anesthesia with the patient or authorized representative who has indicated his/her understanding and acceptance.   Dental advisory given  Plan Discussed with: CRNA and Surgeon  Anesthesia Plan Comments:         Anesthesia Quick Evaluation

## 2016-08-09 NOTE — H&P (View-Only) (Signed)
JENESE MISCHKE 08/01/2016 11:39 AM Location: [facility] Patient #: [id number] DOB: 06-16-1960 Married / Language: [name] / Race: White Female  History of Present Illness [name] A. Carlyne Keehan MD; 08/01/2016 12:21 PM) Patient words: Patient returns for follow-up after seroma drainage from a previous hernia surgery several months ago. She had the area excised which did not show any pathology. She's developed intermittent cellulitis and this is improved on Bactrim. I sent her for a CT scan which shows recurrence of the seroma cavity. Her mesh appears to be intact but could be involved as well. She is feeling much better today. She still has pain at the site but her pain is controlled with pain medication which she takes intermittently. Her drain sites getting inflamed. She still putting out about 80 cc a day from this drain which is serous in nature. CT scan showed no communication with her GI tract.   Labs show a normal WBC           CLINICAL DATA: 56 year old female with an anterior abdominal wall seroma following ventral hernia repair with mesh treated by percutaneous drain placement and alcohol sclerosis which was unsuccessful. The patient underwent open operative the drainage of the seroma and placement of a surgical drain on 07/06/2016.  EXAM: CT ABDOMEN AND PELVIS WITH CONTRAST  TECHNIQUE: Multidetector CT imaging of the abdomen and pelvis was performed using the standard protocol following bolus administration of intravenous contrast.  CONTRAST: 180mL ISOVUE-300 IOPAMIDOL (ISOVUE-300) INJECTION 61%  COMPARISON: Prior CT scan of the abdomen and pelvis 06/27/2016  FINDINGS: Lower chest: The lung bases are clear. Visualized cardiac structures are within normal limits for size. No pericardial effusion. Unremarkable visualized distal thoracic esophagus.  Hepatobiliary: Normal hepatic contour and morphology. No discrete hepatic lesions. Normal appearance of  the gallbladder. No intra or extrahepatic biliary ductal dilatation.  Pancreas: Unremarkable. No pancreatic ductal dilatation or surrounding inflammatory changes.  Spleen: Punctate calcifications consistent with old granulomatous disease.  Adrenals/Urinary Tract: The adrenal glands are normal in appearance. Normal appearance of the left kidney. Surgical changes suggest prior partial nephrectomy on the right. No evidence of hydronephrosis or enhancing renal mass.  Stomach/Bowel: Colonic diverticular disease without CT evidence of active inflammation. No focal bowel wall thickening or evidence of obstruction.  Vascular/Lymphatic: No significant vascular findings are present. No enlarged abdominal or pelvic lymph nodes.  Reproductive: Uterus and bilateral adnexa are unremarkable. Bilateral tubal ligation clips are present.  Other: The previously placed percutaneous drainage catheter is no longer present. A surgically placed drainage catheter is present in the superficial subcutaneous fat. Just deep to the [facility] drain there is recurrence of the seroma measuring approximately 12.9 x 6.3 x 6.3 cm. The fluid collection is capsulated by a relatively thick and enhancing wall and the fluid contains a few small locules of gas.  Musculoskeletal: No acute osseous abnormality.  IMPRESSION: 1. Recurrence of a seroma (or, in the appropriate clinical setting, abscess) just deep to the anterior fascia of the abdominal wall. The seroma measures 12.9 x 6.3 x 6.3 cm and contains a few locules of internal gas which may be related to the presence of the surgical drain, or may denote infection with a gas-forming organism. Of note, the fluid collection is encapsulated by a relatively thick and enhancing rind compared to the the previously drained and excised seroma. 2. The surgically placed ablate drain appears superficial to the recurrent fluid collection. 3. Additional ancillary findings as  above without significant interval change. These results will  be called to the ordering clinician or representative by the Radiologist Assistant, and communication documented in the PACS or zVision Dashboard.   Electronically Signed By: Jacqulynn Cadet M.D. On: 07/28/2016 15:34.  The patient is a 56 year old female.   Allergies [name], [place]; 08/01/2016 11:39 AM) No Known Allergies 08/01/2016  Medication History (Berk Pilot A. Cadey Bazile, MD; 08/01/2016 12:03 PM) OxyCODONE HCl (5MG  Tablet, 1 (one) Tablet Oral every four hours, as needed, Taken starting 07/17/2016) Active. Bactrim DS (800-160MG  Tablet, 1 (one) Tablet Oral two times daily, Taken starting 07/25/2016) Active. Zofran ODT (8MG  Tablet Disint, 1 (one) Tablet Oral every six hours, as needed, Taken starting 07/25/2016) Active. Vitamin B12 (1000MCG Tablet ER, Oral) Active. Gabapentin (800MG  Tablet, Oral three times daily) Active. Citalopram Hydrobromide (20MG  Tablet, Oral daily) Active. Fiber (625MG  Tablet, Oral two times daily) Active. Aspirin EC Low Dose (81MG  Tablet DR, Oral daily) Active. Tricor (145MG  Tablet, Oral daily) Active. Melatonin (10MG  Capsule, Oral daily) Active. (Take two capsules at bedtime.) Protonix (20MG  Tablet DR, Oral two times daily) Active. Medications Reconciled    Vitals [name] RMA; 08/01/2016 11:41 AM) 08/01/2016 11:40 AM Weight: 249 lb Height: 64in Body Surface Area: 2.15 m Body Mass Index: 42.74 kg/m  Temp.: 98.96F  Pulse: 99 (Regular)  BP: 120/80 (Sitting, Left Arm, Standard)      Physical Exam (Angelisa Winthrop A. Lc Joynt MD; 08/01/2016 12:17 PM)  Abdomen Note: Drain with serous drainage. There is about 20 cc in the bulb. The incision is closed without drainage. There is some irritation where the suture holds a drain in place. There is a mild amount of redness there. No overt cellulitis. She still tender to palpation in the area.    Assessment & Plan  (Kwana Ringel A. 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 The drain on CT scan is on the most superficial aspect of this fluid cavity but unfortunately is not evacuating it off. I think she developed an area of sclerosis from the alcohol injection but this was not enough alcohol to sclerose the entire cavity. Therefore, the second fluid collection is probably related to the first but separated by the scarring from the ethanol injection. The mesh appears to be intact but I do have concerns that mesh may be involved in portal problem in her case since this will not resolve despite previous treatments. I recommended talc application to the seroma cavity in the operating room. I also could reevaluate to see if her mesh is involved which does not appear to be the case last time she was in surgery a do have concerns this is causing this problem not to resolve. She may require mesh explantation and primary closure. Fortunately the hernia was a small hernia and this can be done by her recurrence rates are higher with this technique which she understands. Hopefully the mesh can be left in place as before the talc be applied this process will resolve and I explained to her we may have to remove the mesh.   Risk of bleeding infection recurrence bowel injury recurrent hernia pain DVT death cardiac complications and more surgery  Current Plans Pt Education - NCCSRS: no at risk use: discussed with patient and provided information.

## 2016-08-09 NOTE — Op Note (Addendum)
Preoperative diagnosis: Chronic abdominal wall seroma  Postoperative diagnosis: Same  Procedure: Drainage of chronic abdominal wall seroma with application of talc  Surgeon: Erroll Luna M.D.  Anesthesia: LMA with 0.5% bupivacaine plain local  EBL: 10 mL  Specimen: None  Drains: 19 round Blake drain to seroma cavity  Indications for procedure: The patient's a 56 year old female who is 2 years status post right hemicolectomy for colon cancer. She developed an extraction site hernia last fall and underwent open repair with mesh. She has many issues with a chronic seroma. She  has undergone percutaneous drainage with alcohol sclerotherapy. Unfortunately these did not help her and her seroma continues. She underwent debridement last month but her seroma recurred. She returns today for definitive treatment with talc application and evaluation of her mesh to exclude a mesh infection as the inciting cause of her chronic seroma. She has had multiple CT scans showing this to be confined the subcutaneous space anterior to the abdominal wall away from her mesh which is retrorectus. She's had no foul-smelling drainage and had one episode of cellulitis in the last month. The drainage to with serous in nature. We discussed other options of treatment which she wished to proceed due to the chronicity of her problem.The procedure has been discussed with the patient.  Alternative therapies have been discussed with the patient.  Operative risks include bleeding,  Infection,  Organ injury,  Nerve injury,  Blood vessel injury,  DVT,  Pulmonary embolism,  Death,  And possible reoperation.  Medical management risks include worsening of present situation.  The success of the procedure is 50 -90 % at treating patients symptoms.  The patient understands and agrees to proceed.   Description of procedure: The patient was met in the holding area and questions were answered. She was taken back to the operating room supine on  the operating room table. After induction of LMA anesthesia, was prepped and draped in sterile fashion. She received 3 g of Ancef. Timeout was done. The old incision was opened up. I was able to dissect tumor subcutaneous fatty tissues. There is a small opening which communicated to the deeper seroma cavity. Her previous attempted sclerotherapy scarred anterior part of the seroma but left the posterior component intact. I opened down to the abdominal wall entered a 5 cm chronic seroma cavity. I excised along the seroma tissue down to the abdominal wall. There is no mesh that was exposed and I could see. Did not see a recurrent hernia or any infection. There is no foul-smelling drainage noted and I saw no evidence of any visceral involved in this process. After debridement of the seroma cavity hemostasis was achieved. A sterile towel outer was applied throughout the cavity. Through a left sided stab wound, a 19 round drain was placed into the cavity. Of note, she had a biopsy breast tissue with benign one month ago. After ensuring hemostasis the wound was closed with 3-0 Vicryl and 4-0 Monocryl. A liquid adhesive applied. All final counts were found correct. The patient was extubated taken to recovery in satisfactory condition.

## 2016-08-09 NOTE — Anesthesia Procedure Notes (Signed)
Procedure Name: LMA Insertion Date/Time: 08/09/2016 8:26 AM Performed by: Toula Moos L Pre-anesthesia Checklist: Patient identified, Emergency Drugs available, Suction available, Patient being monitored and Timeout performed Patient Re-evaluated:Patient Re-evaluated prior to inductionOxygen Delivery Method: Circle system utilized Preoxygenation: Pre-oxygenation with 100% oxygen Intubation Type: IV induction Ventilation: Mask ventilation without difficulty LMA: LMA inserted LMA Size: 4.0 Number of attempts: 1 Airway Equipment and Method: Bite block Placement Confirmation: positive ETCO2 Tube secured with: Tape Dental Injury: Teeth and Oropharynx as per pre-operative assessment

## 2016-08-09 NOTE — Anesthesia Postprocedure Evaluation (Signed)
Anesthesia Post Note  Patient: Krystal Campbell  Procedure(s) Performed: Procedure(s) (LRB): DRAINAGE OF ABDOMINAL WALL SEROMA (N/A)  Patient location during evaluation: PACU Anesthesia Type: General Level of consciousness: awake and alert Pain management: pain level controlled Vital Signs Assessment: post-procedure vital signs reviewed and stable Respiratory status: spontaneous breathing, nonlabored ventilation, respiratory function stable and patient connected to nasal cannula oxygen Cardiovascular status: blood pressure returned to baseline and stable Postop Assessment: no signs of nausea or vomiting Anesthetic complications: no       Last Vitals:  Vitals:   08/09/16 1030 08/09/16 1156  BP: (!) 114/49 103/60  Pulse: 87 76  Resp: 14 18  Temp:  37.1 C    Last Pain:  Vitals:   08/09/16 1156  TempSrc:   PainSc: 4                  Montez Hageman

## 2016-08-09 NOTE — Transfer of Care (Signed)
Immediate Anesthesia Transfer of Care Note  Patient: ROMANDA TURRUBIATES  Procedure(s) Performed: Procedure(s): DRAINAGE OF ABDOMINAL WALL SEROMA (N/A)  Patient Location: PACU  Anesthesia Type:General  Level of Consciousness: awake and patient cooperative  Airway & Oxygen Therapy: Patient Spontanous Breathing and Patient connected to face mask oxygen  Post-op Assessment: Report given to RN and Post -op Vital signs reviewed and stable  Post vital signs: Reviewed and stable  Last Vitals:  Vitals:   08/09/16 0713  BP: 119/67  Pulse: 69  Resp: 18  Temp: 37.2 C    Last Pain:  Vitals:   08/09/16 0713  TempSrc: Oral  PainSc: 7       Patients Stated Pain Goal: 2 (35/24/81 8590)  Complications: No apparent anesthesia complications

## 2016-08-09 NOTE — Interval H&P Note (Signed)
History and Physical Interval Note:  08/09/2016 8:11 AM  Krystal Campbell  has presented today for surgery, with the diagnosis of Chronic Seroma  The various methods of treatment have been discussed with the patient and family. After consideration of risks, benefits and other options for treatment, the patient has consented to  Procedure(s): DRAINAGE OF ABDOMINAL WALL SEROMA, POSSIBLE REMOVAL OF MESH (N/A) as a surgical intervention .  The patient's history has been reviewed, patient examined, no change in status, stable for surgery.  I have reviewed the patient's chart and labs.  Questions were answered to the patient's satisfaction.     Krystal Campbell A.

## 2016-08-10 DIAGNOSIS — L7634 Postprocedural seroma of skin and subcutaneous tissue following other procedure: Secondary | ICD-10-CM | POA: Diagnosis not present

## 2016-08-10 LAB — GLUCOSE, CAPILLARY: Glucose-Capillary: 98 mg/dL (ref 65–99)

## 2016-08-10 NOTE — Discharge Summary (Signed)
Physician Discharge Summary  Patient ID: CARRIGAN DELAFUENTE MRN: [id number] DOB/AGE: November 05, 1960 56 y.o.  Admit date: 08/09/2016 Discharge date: 08/10/2016  Admission Diagnoses:seroma chronic abdominal wall  Discharge Diagnoses:  same  Discharged Condition: good  Hospital Course: unremarkable Pt did well Tolerated her diet and had good pain control  Consults: None  Significant Diagnostic Studies: none  Treatments: surgery: seroma drainage   Discharge Exam: Blood pressure 103/63, pulse 78, temperature 98.2 F (36.8 C), resp. rate 16, height 5\' 4"  (1.626 m), weight 112.5 kg (248 lb), SpO2 100 %. Incision/Wound:CDI JP in place   Disposition: 01-Home or Self Care  Discharge Instructions    Diet - low sodium heart healthy    Complete by:  As directed    Diet - low sodium heart healthy    Complete by:  As directed    Increase activity slowly    Complete by:  As directed    Increase activity slowly    Complete by:  As directed      Allergies as of 08/10/2016   No Known Allergies     Medication List    TAKE these medications   aspirin EC 81 MG tablet Take 81 mg by mouth daily.   cyanocobalamin 1000 MCG tablet Take 1 tablet (1,000 mcg total) by mouth daily.   fenofibrate 145 MG tablet Commonly known as:  TRICOR Take 145 mg by mouth daily.   Fiber 625 MG Tabs Take 625 mg by mouth 2 (two) times daily.   gabapentin 800 MG tablet Commonly known as:  NEURONTIN Take 800 mg by mouth 3 (three) times daily.   ibuprofen 800 MG tablet Commonly known as:  ADVIL,MOTRIN Take 1 tablet (800 mg total) by mouth every 8 (eight) hours as needed.   Melatonin 10 MG Caps Take 10 mg by mouth at bedtime.   metFORMIN 1000 MG tablet Commonly known as:  GLUCOPHAGE Take 1,000 mg by mouth 2 (two) times daily.   oxyCODONE 5 MG immediate release tablet Commonly known as:  Oxy IR/ROXICODONE Take 1-2 tablets (5-10 mg total) by mouth every 6 (six) hours as needed for severe pain.    pantoprazole 40 MG tablet Commonly known as:  PROTONIX Take 1 tablet (40 mg total) by mouth 2 (two) times daily.   sulfamethoxazole-trimethoprim 800-160 MG tablet Commonly known as:  BACTRIM DS,SEPTRA DS Take 1 tablet by mouth 2 (two) times daily.        Signed: Mikaili Flippin A. 08/10/2016, 7:27 AM

## 2016-08-10 NOTE — Discharge Instructions (Signed)
Surgical Drain Home Care °Surgical drains are used to remove extra fluid that normally builds up in a surgical wound after surgery. A surgical drain helps to heal a surgical wound. Different kinds of surgical drains include: °· Active drains. These drains use suction to pull drainage away from the surgical wound. Drainage flows through a tube to a container outside of the body. It is important to keep the bulb or the drainage container flat (compressed) at all times, except while you empty it. Flattening the bulb or container creates suction. The two most common types of active drains are bulb drains and Hemovac drains. °· Passive drains. These drains allow fluid to drain naturally, by gravity. Drainage flows through a tube to a bandage (dressing) or a container outside of the body. Passive drains do not need to be emptied. The most common type of passive drain is the Penrose drain. °A drain is placed during surgery. Immediately after surgery, drainage is usually bright red and a little thicker than water. The drainage may gradually turn yellow or pink and become thinner. It is likely that your health care provider will remove the drain when the drainage stops or when the amount decreases to 1-2 Tbsp (15-30 mL) during a 24-hour period. °How to care for your surgical drain °· Keep the skin around the drain dry and covered with a dressing at all times. °· Check your drain area every day for signs of infection. Check for: °¨ More redness, swelling, or pain. °¨ Pus or a bad smell. °¨ Cloudy drainage. °Follow instructions from your health care provider about how to take care of your drain and how to change your dressing. Change your dressing at least one time every day. Change it more often if needed to keep the dressing dry. Make sure you: °1. Gather your supplies, including: °¨ Tape. °¨ Germ-free cleaning solution (sterile saline). °¨ Split gauze drain sponge: 4 x 4 inches (10 x 10 cm). °¨ Gauze square: 4 x 4 inches  (10 x 10 cm). °2. Wash your hands with soap and water before you change your dressing. If soap and water are not available, use hand sanitizer. °3. Remove the old dressing. Avoid using scissors to do that. °4. Use sterile saline to clean your skin around the drain. °5. Place the tube through the slit in a drain sponge. Place the drain sponge so that it covers your wound. °6. Place the gauze square or another drain sponge on top of the drain sponge that is on the wound. Make sure the tube is between those layers. °7. Tape the dressing to your skin. °8. If you have an active bulb or Hemovac drain, tape the drainage tube to your skin 1-2 inches (2.5-5 cm) below the place where the tube enters your body. Taping keeps the tube from pulling on any stitches (sutures) that you have. °9. Wash your hands with soap and water. °10. Write down the color of your drainage and how often you change your dressing. °How to empty your active bulb or Hemovac drain °1. Make sure that you have a measuring cup that you can empty your drainage into. °2. Wash your hands with soap and water. If soap and water are not available, use hand sanitizer. °3. Gently move your fingers down the tube while squeezing very lightly. This is called stripping the tube. This clears any drainage, clots, or tissue from the tube. °¨ Do not pull on the tube. °¨ You may need to strip the tube   several times every day to keep the tube clear. 4. Open the bulb cap or the drain plug. Do not touch the inside of the cap or the bottom of the plug. 5. Empty all of the drainage into the measuring cup. 6. Compress the bulb or the container and replace the cap or the plug. To compress the bulb or the container, squeeze it firmly in the middle while you close the cap or plug the container. 7. Write down the amount of drainage that you have in each 24-hour period. If you have less than 2 Tbsp (30 mL) of drainage during 24 hours, contact your health care provider. 8. Flush  the drainage down the toilet. 9. Wash your hands with soap and water. Contact a health care provider if:  You have more redness, swelling, or pain around your drain area.  The amount of drainage that you have is increasing instead of decreasing.  You have pus or a bad smell coming from your drain area.  You have a fever.  You have drainage that is cloudy.  There is a sudden stop or a sudden decrease in the amount of drainage that you have.  Your tube falls out.  Your active draindoes not stay compressedafter you empty it. This information is not intended to replace advice given to you by your health care provider. Make sure you discuss any questions you have with your health care provider. Document Released: 03/10/2000 Document Revised: 08/19/2015 Document Reviewed: 09/30/2014 Elsevier Interactive Patient Education  2017 [facility] Instructions  Activity: Get plenty of rest for the remainder of the day. A responsible individual must stay with you for 24 hours following the procedure.  For the next 24 hours, DO NOT: -Drive a car -Paediatric nurse -Drink alcoholic beverages -Take any medication unless instructed by your physician -Make any legal decisions or sign important papers.  Meals: Start with liquid foods such as gelatin or soup. Progress to regular foods as tolerated. Avoid greasy, spicy, heavy foods. If nausea and/or vomiting occur, drink only clear liquids until the nausea and/or vomiting subsides. Call your physician if vomiting continues.  Special Instructions/Symptoms: Your throat may feel dry or sore from the anesthesia or the breathing tube placed in your throat during surgery. If this causes discomfort, gargle with warm salt water. The discomfort should disappear within 24 hours.  If you had a scopolamine patch placed behind your ear for the management of post- operative nausea and/or vomiting:  1. The medication in the patch  is effective for 72 hours, after which it should be removed.  Wrap patch in a tissue and discard in the trash. Wash hands thoroughly with soap and water. 2. You may remove the patch earlier than 72 hours if you experience unpleasant side effects which may include dry mouth, dizziness or visual disturbances. 3. Avoid touching the patch. Wash your hands with soap and water after contact with the patch.       JP Drain [employer] this sheet to all of your post-operative appointments while you have your drains.  Please measure your drains by CC's or ML's.  Make sure you drain and measure your JP Drains 2 or 3 times per day.  At the end of each day, add up totals for the left side and add up totals for the right side.    ( 9 am )     ( 3 pm )        (  9 pm )                Date L  R  L  R  L  R  Total L/R

## 2016-08-11 ENCOUNTER — Encounter (HOSPITAL_BASED_OUTPATIENT_CLINIC_OR_DEPARTMENT_OTHER): Payer: Self-pay | Admitting: Surgery

## 2016-09-08 ENCOUNTER — Encounter: Payer: Self-pay | Admitting: Interventional Radiology

## 2016-10-12 ENCOUNTER — Other Ambulatory Visit: Payer: Self-pay | Admitting: Surgery

## 2016-10-12 DIAGNOSIS — S301XXA Contusion of abdominal wall, initial encounter: Secondary | ICD-10-CM

## 2016-10-17 ENCOUNTER — Ambulatory Visit
Admission: RE | Admit: 2016-10-17 | Discharge: 2016-10-17 | Disposition: A | Discharge status: Home / Self Care | Payer: Medicare PPO | Source: Ambulatory Visit | Attending: Surgery | Admitting: Surgery

## 2016-10-17 DIAGNOSIS — S301XXA Contusion of abdominal wall, initial encounter: Secondary | ICD-10-CM

## 2016-10-17 MED ORDER — IOPAMIDOL (ISOVUE-300) INJECTION 61%
125.0000 mL | Freq: Once | INTRAVENOUS | Status: AC | PRN
  Administered 2016-10-17: 125 mL via INTRAVENOUS

## 2016-11-13 ENCOUNTER — Ambulatory Visit: Payer: Self-pay | Admitting: Surgery

## 2016-11-13 NOTE — H&P (Signed)
Krystal Campbell 11/13/2016 1:40 PM Location: [facility] Patient #: [id number] DOB: 08/27/1960 Married / Language: English / Race: White Female  History of Present Illness ([name] A. 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 She said to procedures to drain this percutaneous sclerotherapy and 1 open. She has subsequent talc sclerosis as well. Enforcing, she has failed all these seromas keep recurring. She does have pain from this but no fever or chills. The pain is sharp located at the site of the seroma she states. There's been no drainage.  CLINICAL DATA: Status post seroma drainage. EXAM: CT ABDOMEN AND PELVIS WITH CONTRAST TECHNIQUE: Multidetector CT imaging of the abdomen and pelvis was performed using the standard protocol following bolus administration of intravenous contrast. CONTRAST: 125mL ISOVUE-300 IOPAMIDOL (ISOVUE-300) INJECTION 61% COMPARISON: CT scan of Jul 28, 2016. FINDINGS: Lower chest: No acute abnormality. Hepatobiliary: No focal liver abnormality is seen. No gallstones, gallbladder wall thickening, or biliary dilatation. Pancreas: Unremarkable. No pancreatic ductal dilatation or surrounding inflammatory changes. Spleen: Stable calcified splenic granulomata are noted. Adrenals/Urinary Tract: Adrenal glands appear normal. Stable postsurgical changes are seen involving the right kidney. Left kidney appears normal. No hydronephrosis or renal obstruction is noted. No renal or ureteral calculi are noted. Urinary bladder is unremarkable. Stomach/Bowel: The stomach appears normal. Diverticulosis of descending and sigmoid colon is noted without inflammation. There is no evidence of bowel obstruction. The appendix is not visualized. Vascular/Lymphatic: No significant vascular findings are present. No enlarged abdominal or pelvic lymph nodes. Reproductive: Uterus and bilateral adnexa are  unremarkable. Bilateral ligation clips are noted. Other: The postoperative seroma seen along anterior abdominal wall prior exam is significantly smaller, currently measuring 9.6 x 4.2 x 3.2 cm. However, there is the interval development of another seroma of immediately anterior to the previously described 1. This seroma measures 9.1 x 7.0 x 4.7 cm. Musculoskeletal: No acute or significant osseous findings. IMPRESSION: Stable postoperative changes are seen involving the right kidney. Diverticulosis of descending and sigmoid colon is noted without inflammation. Anterior abdominal wall seroma noted on previous cm is significantly smaller, currently measuring 9.6 x 4.2 x 3.2 cm. However, there is interval development of a new seroma immediately anterior to the previously described seroma, measuring 9.1 x 7.0 x 4.7 cm. Electronically Signed By: [name], M.D. On: 10/17/2016 15:50 .  The patient is a 56 year old female.   Allergies ([name], RMA; 11/13/2016 1:40 PM) Allergies Reconciled No Known Drug Allergies 11/17/2014 No Known Allergies 08/01/2016  Medication History ([name], RMA; 11/13/2016 1:40 PM) Citalopram Hydrobromide (20MG Tablet, Oral daily) Active. Fiber (625MG Tablet, Oral two times daily) Active. Aspirin EC Low Dose (81MG Tablet DR, Oral daily) Active. Tricor (145MG Tablet, Oral daily) Active. Melatonin (10MG Capsule, Oral daily) Active. (Take two capsules at bedtime.) Protonix (20MG Tablet DR, Oral two times daily) Active. Vitamin B12 (1000MCG Tablet ER, Oral) Active. Gabapentin (800MG Tablet, Oral three times daily) Active. Medications Reconciled    Vitals ([name] RMA; 11/13/2016 1:41 PM) 11/13/2016 1:40 PM Weight: 258.4 lb Height: 64in Body Surface Area: 2.18 m Body Mass Index: 44.35 kg/m  Temp.: 99F  Pulse: 90 (Regular)  BP: 132/80 (Sitting, Left Arm, Standard)      Physical Exam ([name] A.  [name] MD; 11/13/2016 3:30 PM)  Abdomen Note: Persistent periumbilical seroma noted to have previous hernia repair site. Tender but not having any signs of peritonitis.  Neurologic Neurologic evaluation reveals -alert and   oriented x 3 with no impairment of recent or remote memory. Mental Status-Normal.  Musculoskeletal Normal Exam - Left-Upper Extremity Strength Normal and Lower Extremity Strength Normal. Normal Exam - Right-Upper Extremity Strength Normal, Lower Extremity Weakness.    Assessment & Plan ([name] A. [name] MD; 11/13/2016 3:30 PM)  ABDOMINAL WALL SEROMA (T88.8XXA) Impression: Long discussion about options today. CT scan shows work origin new seroma cavities. She's had sclerotherapy both by catheter and open. There's been no infection but she still has pain from this area. I suspect her underlying mesh is the problem. I recommended mesh explantation with repair either with primary suture closure versus the use of biologic mesh to repair this. Risk of bleeding, infection, bowel injury, recurrence rates of up to 25%, or injury, and complications of underlying medical conditions. The risk of hernia repair include bleeding, infection, organ injury, bowel injury, bladder injury, nerve injury recurrent hernia, blood clots, worsening of underlying condition, chronic pain, mesh use, open surgery, death, and the need for other operattions. Pt agrees to proceed  Current Plans You are being scheduled for surgery- Our schedulers will call you.  You should hear from our office's scheduling department within 5 working days about the location, date, and time of surgery. We try to make accommodations for patient's preferences in scheduling surgery, but sometimes the OR schedule or the surgeon's schedule prevents us from making those accommodations.  If you have not heard from our office ([phone]) in 5 working days, call the office and ask for your surgeon's nurse.  If you  have other questions about your diagnosis, plan, or surgery, call the office and ask for your surgeon's nurse.  The anatomy & physiology of the abdominal wall was discussed. The pathophysiology of hernias was discussed. Natural history risks without surgery including progeressive enlargement, pain, incarceration, & strangulation was discussed. Contributors to complications such as smoking, obesity, diabetes, prior surgery, etc were discussed.  I feel the risks of no intervention will lead to serious problems that outweigh the operative risks; therefore, I recommended surgery to reduce and repair the hernia. I explained laparoscopic techniques with possible need for an open approach. I noted the probable use of mesh to patch and/or buttress the hernia repair  Risks such as bleeding, infection, abscess, need for further treatment, heart attack, death, and other risks were discussed. I noted a good likelihood this will help address the problem. Goals of post-operative recovery were discussed as well. Possibility that this will not correct all symptoms was explained. I stressed the importance of low-impact activity, aggressive pain control, avoiding constipation, & not pushing through pain to minimize risk of post-operative chronic pain or injury. Possibility of reherniation especially with smoking, obesity, diabetes, immunosuppression, and other health conditions was discussed. We will work to minimize complications.  An educational handout further explaining the pathology & treatment options was given as well. Questions were answered. The patient expresses understanding & wishes to proceed with surgery.  Pt Education - Pamphlet Given - Hernia Surgery: discussed with patient and provided information. INCISIONAL HERNIA, WITHOUT OBSTRUCTION OR GANGRENE (K43.2) 

## 2016-11-17 NOTE — Pre-Procedure Instructions (Addendum)
Krystal Campbell  11/17/2016      [place], [place] [address] Phone: [phone] Fax: [phone]    Your procedure is scheduled on August 30th, Thursday.   Report to [facility] Admitting at 7:30 AM             (posted surgery time 9:29a - 11:29a)   Call this number if you have problems the morning of surgery:  [phone], for other questions, call [phone] Mon-Fri 8a-4p   Remember:   Do not eat food or drink liquids after midnight Wednesday.              4-5 days prior to surgery, STOP TAKING any Vitamins, Anti-inflammatories, Herbal Supplements.   Take these medicines the morning of surgery with A SIP OF WATER : Gabapentin, Pantoprazole.              On the morning of surgery, you WILL NOT take diabetes medication    Do not wear jewelry, make-up or nail polish.  Do not wear lotions, powders,  perfumes, or deoderant.  Do not shave 48 hours prior to surgery.     Do not bring valuables to the hospital.  [facility] is not responsible for any belongings or valuables.  Contacts, dentures or bridgework may not be worn into surgery.  Leave your suitcase in the car.  After surgery it may be brought to your room.  For patients admitted to the hospital, discharge time will be determined by your treatment team.  Patients discharged the day of surgery will not be allowed to drive home, and will need someone to stay with you for the first 24 hrs.  Please read over the following fact sheets that you were given. Pain Booklet and Surgical Site Infection Prevention       [facility]- Preparing For Surgery  Before surgery, you can play an important role. Because skin is not sterile, your skin needs to be as free of germs as possible. You can reduce the number of germs on your skin by washing with CHG (chlorahexidine gluconate) Soap before surgery.  CHG is an antiseptic cleaner which kills germs  and bonds with the skin to continue killing germs even after washing.  Please do not use if you have an allergy to CHG or antibacterial soaps. If your skin becomes reddened/irritated stop using the CHG.  Do not shave (including legs and underarms) for at least 48 hours prior to first CHG shower. It is OK to shave your face.  Please follow these instructions carefully.   1. Shower the NIGHT BEFORE SURGERY and the MORNING OF SURGERY with CHG.   2. If you chose to wash your hair, wash your hair first as usual with your normal shampoo.  3. After you shampoo, rinse your hair and body thoroughly to remove the shampoo.  4. Use CHG as you would any other liquid soap. You can apply CHG directly to the skin and wash gently with a scrungie or a clean washcloth.   5. Apply the CHG Soap to your body ONLY FROM THE NECK DOWN.  Do not use on open wounds or open sores. Avoid contact with your eyes, ears, mouth and genitals (private parts). Wash genitals (private parts) with your normal soap.  6. Wash thoroughly, paying special attention to the area where your surgery will be performed.  7. Thoroughly rinse your body with warm  water from the neck down.  8. DO NOT shower/wash with your normal soap after using and rinsing off the CHG Soap.  9. Pat yourself dry with a CLEAN TOWEL.   10. Wear CLEAN PAJAMAS   11. Place CLEAN SHEETS on your bed the night of your first shower and DO NOT SLEEP WITH PETS.    Day of Surgery: Do not apply any deodorants/lotions. Please wear clean clothes to the hospital/surgery center.

## 2016-11-20 ENCOUNTER — Encounter (HOSPITAL_COMMUNITY)
Admission: RE | Admit: 2016-11-20 | Discharge: 2016-11-20 | Disposition: A | Discharge status: Home / Self Care | Payer: Medicare PPO | Source: Ambulatory Visit | Attending: Surgery | Admitting: Surgery

## 2016-11-20 ENCOUNTER — Encounter (HOSPITAL_COMMUNITY): Payer: Self-pay | Admitting: *Deleted

## 2016-11-20 DIAGNOSIS — Z01812 Encounter for preprocedural laboratory examination: Secondary | ICD-10-CM

## 2016-11-20 HISTORY — DX: Chronic kidney disease, unspecified: N18.9

## 2016-11-20 LAB — GLUCOSE, CAPILLARY: Glucose-Capillary: 167 mg/dL — ABNORMAL HIGH (ref 65–99)

## 2016-11-20 LAB — COMPREHENSIVE METABOLIC PANEL
ALK PHOS: 83 U/L (ref 38–126)
ALT: 14 U/L (ref 14–54)
AST: 20 U/L (ref 15–41)
Albumin: 3.4 g/dL — ABNORMAL LOW (ref 3.5–5.0)
Anion gap: 10 (ref 5–15)
BILIRUBIN TOTAL: 0.4 mg/dL (ref 0.3–1.2)
BUN: 12 mg/dL (ref 6–20)
CO2: 23 mmol/L (ref 22–32)
CREATININE: 0.98 mg/dL (ref 0.44–1.00)
Calcium: 9.5 mg/dL (ref 8.9–10.3)
Chloride: 104 mmol/L (ref 101–111)
GFR calc Af Amer: >60 mL/min (ref >60)
Glucose, Bld: 154 mg/dL — ABNORMAL HIGH (ref 65–99)
Potassium: 4.5 mmol/L (ref 3.5–5.1)
Sodium: 137 mmol/L (ref 135–145)
TOTAL PROTEIN: 7.2 g/dL (ref 6.5–8.1)

## 2016-11-20 LAB — CBC WITH DIFFERENTIAL/PLATELET
BASOS PCT: 0 %
Basophils Absolute: 0 10*3/uL (ref 0.0–0.1)
EOS PCT: 3 %
Eosinophils Absolute: 0.2 10*3/uL (ref 0.0–0.7)
HEMATOCRIT: 39 % (ref 36.0–46.0)
Hemoglobin: 12.3 g/dL (ref 12.0–15.0)
LYMPHS PCT: 15 %
Lymphs Abs: 1 10*3/uL (ref 0.7–4.0)
MCH: 29 pg (ref 26.0–34.0)
MCHC: 31.5 g/dL (ref 30.0–36.0)
MCV: 92 fL (ref 78.0–100.0)
MONO ABS: 0.4 10*3/uL (ref 0.1–1.0)
MONOS PCT: 6 %
NEUTROS ABS: 4.7 10*3/uL (ref 1.7–7.7)
Neutrophils Relative %: 76 %
Platelets: 289 10*3/uL (ref 150–400)
RBC: 4.24 MIL/uL (ref 3.87–5.11)
RDW: 14.8 % (ref 11.5–15.5)
WBC: 6.3 10*3/uL (ref 4.0–10.5)

## 2016-11-20 LAB — HEMOGLOBIN A1C
HEMOGLOBIN A1C: 6.9 % — AB (ref 4.8–5.6)
Mean Plasma Glucose: 151.33 mg/dL

## 2016-11-20 NOTE — Progress Notes (Addendum)
PCP is Dr. Laren Boom @ [facility] 09/2016  He also takes care of her diabetes meds  Back in 2016 she had colon cancer (2 tumors found), 2 blood clots found in lungs  -- IVC placed and is now removed.  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  Doesn't see a nephrologist. She checks blood sugars every other day.  Usually runs 155-160.  Denies any murmur, cardiac issues now.  Did have Echo in 2016.  Denies seeing a cardio.

## 2016-11-22 MED ORDER — DEXTROSE 5 % IV SOLN
3.0000 g | INTRAVENOUS | Status: AC
  Administered 2016-11-23: 3 g via INTRAVENOUS
  Filled 2016-11-22: qty 3000

## 2016-11-23 ENCOUNTER — Inpatient Hospital Stay (HOSPITAL_COMMUNITY)
Admission: RE | Admit: 2016-11-23 | Discharge: 2016-12-08 | DRG: 907 | Disposition: A | Discharge status: Skilled Nursing Facility | Payer: Medicare PPO | Source: Ambulatory Visit | Attending: Surgery | Admitting: Surgery

## 2016-11-23 ENCOUNTER — Encounter (HOSPITAL_COMMUNITY)
Admission: RE | Disposition: A | Discharge status: Skilled Nursing Facility | Payer: Self-pay | Source: Ambulatory Visit | Attending: Surgery

## 2016-11-23 ENCOUNTER — Ambulatory Visit (HOSPITAL_COMMUNITY): Payer: Medicare PPO | Admitting: Certified Registered Nurse Anesthetist

## 2016-11-23 ENCOUNTER — Ambulatory Visit (HOSPITAL_COMMUNITY): Payer: Medicare PPO | Admitting: Emergency Medicine

## 2016-11-23 ENCOUNTER — Encounter (HOSPITAL_COMMUNITY): Payer: Self-pay | Admitting: Certified Registered Nurse Anesthetist

## 2016-11-23 DIAGNOSIS — Z79899 Other long term (current) drug therapy: Secondary | ICD-10-CM

## 2016-11-23 DIAGNOSIS — N183 Chronic kidney disease, stage 3 unspecified: Secondary | ICD-10-CM

## 2016-11-23 DIAGNOSIS — E1122 Type 2 diabetes mellitus with diabetic chronic kidney disease: Secondary | ICD-10-CM | POA: Diagnosis present

## 2016-11-23 DIAGNOSIS — Z6841 Body Mass Index (BMI) 40.0 and over, adult: Secondary | ICD-10-CM

## 2016-11-23 DIAGNOSIS — R0682 Tachypnea, not elsewhere classified: Secondary | ICD-10-CM | POA: Diagnosis not present

## 2016-11-23 DIAGNOSIS — G894 Chronic pain syndrome: Secondary | ICD-10-CM | POA: Diagnosis present

## 2016-11-23 DIAGNOSIS — J9600 Acute respiratory failure, unspecified whether with hypoxia or hypercapnia: Secondary | ICD-10-CM

## 2016-11-23 DIAGNOSIS — R112 Nausea with vomiting, unspecified: Secondary | ICD-10-CM | POA: Diagnosis present

## 2016-11-23 DIAGNOSIS — K430 Incisional hernia with obstruction, without gangrene: Secondary | ICD-10-CM | POA: Diagnosis not present

## 2016-11-23 DIAGNOSIS — N179 Acute kidney failure, unspecified: Secondary | ICD-10-CM | POA: Diagnosis not present

## 2016-11-23 DIAGNOSIS — Z23 Encounter for immunization: Secondary | ICD-10-CM

## 2016-11-23 DIAGNOSIS — I10 Essential (primary) hypertension: Secondary | ICD-10-CM | POA: Diagnosis not present

## 2016-11-23 DIAGNOSIS — J9601 Acute respiratory failure with hypoxia: Secondary | ICD-10-CM | POA: Diagnosis not present

## 2016-11-23 DIAGNOSIS — G8918 Other acute postprocedural pain: Secondary | ICD-10-CM

## 2016-11-23 DIAGNOSIS — S301XXA Contusion of abdominal wall, initial encounter: Secondary | ICD-10-CM | POA: Diagnosis present

## 2016-11-23 DIAGNOSIS — D62 Acute posthemorrhagic anemia: Secondary | ICD-10-CM | POA: Diagnosis not present

## 2016-11-23 DIAGNOSIS — Z87891 Personal history of nicotine dependence: Secondary | ICD-10-CM

## 2016-11-23 DIAGNOSIS — Z452 Encounter for adjustment and management of vascular access device: Secondary | ICD-10-CM

## 2016-11-23 DIAGNOSIS — R5381 Other malaise: Secondary | ICD-10-CM | POA: Diagnosis not present

## 2016-11-23 DIAGNOSIS — Z794 Long term (current) use of insulin: Secondary | ICD-10-CM

## 2016-11-23 DIAGNOSIS — T888XXD Other specified complications of surgical and medical care, not elsewhere classified, subsequent encounter: Secondary | ICD-10-CM | POA: Diagnosis not present

## 2016-11-23 DIAGNOSIS — Z789 Other specified health status: Secondary | ICD-10-CM

## 2016-11-23 DIAGNOSIS — Y92239 Unspecified place in hospital as the place of occurrence of the external cause: Secondary | ICD-10-CM | POA: Diagnosis not present

## 2016-11-23 DIAGNOSIS — K567 Ileus, unspecified: Secondary | ICD-10-CM | POA: Diagnosis not present

## 2016-11-23 DIAGNOSIS — K449 Diaphragmatic hernia without obstruction or gangrene: Secondary | ICD-10-CM | POA: Diagnosis present

## 2016-11-23 DIAGNOSIS — E873 Alkalosis: Secondary | ICD-10-CM | POA: Diagnosis not present

## 2016-11-23 DIAGNOSIS — K219 Gastro-esophageal reflux disease without esophagitis: Secondary | ICD-10-CM | POA: Diagnosis present

## 2016-11-23 DIAGNOSIS — E1169 Type 2 diabetes mellitus with other specified complication: Secondary | ICD-10-CM | POA: Diagnosis not present

## 2016-11-23 DIAGNOSIS — K439 Ventral hernia without obstruction or gangrene: Secondary | ICD-10-CM | POA: Diagnosis not present

## 2016-11-23 DIAGNOSIS — I129 Hypertensive chronic kidney disease with stage 1 through stage 4 chronic kidney disease, or unspecified chronic kidney disease: Secondary | ICD-10-CM | POA: Diagnosis present

## 2016-11-23 DIAGNOSIS — C189 Malignant neoplasm of colon, unspecified: Secondary | ICD-10-CM | POA: Diagnosis not present

## 2016-11-23 DIAGNOSIS — R0689 Other abnormalities of breathing: Secondary | ICD-10-CM | POA: Diagnosis not present

## 2016-11-23 DIAGNOSIS — Z86711 Personal history of pulmonary embolism: Secondary | ICD-10-CM | POA: Diagnosis not present

## 2016-11-23 DIAGNOSIS — F419 Anxiety disorder, unspecified: Secondary | ICD-10-CM | POA: Diagnosis not present

## 2016-11-23 DIAGNOSIS — D72829 Elevated white blood cell count, unspecified: Secondary | ICD-10-CM | POA: Diagnosis not present

## 2016-11-23 DIAGNOSIS — J9690 Respiratory failure, unspecified, unspecified whether with hypoxia or hypercapnia: Secondary | ICD-10-CM

## 2016-11-23 DIAGNOSIS — I9581 Postprocedural hypotension: Secondary | ICD-10-CM | POA: Diagnosis not present

## 2016-11-23 DIAGNOSIS — D649 Anemia, unspecified: Secondary | ICD-10-CM | POA: Diagnosis present

## 2016-11-23 DIAGNOSIS — Z978 Presence of other specified devices: Secondary | ICD-10-CM | POA: Diagnosis not present

## 2016-11-23 DIAGNOSIS — K5790 Diverticulosis of intestine, part unspecified, without perforation or abscess without bleeding: Secondary | ICD-10-CM | POA: Diagnosis present

## 2016-11-23 DIAGNOSIS — Z4659 Encounter for fitting and adjustment of other gastrointestinal appliance and device: Secondary | ICD-10-CM | POA: Diagnosis not present

## 2016-11-23 DIAGNOSIS — E46 Unspecified protein-calorie malnutrition: Secondary | ICD-10-CM | POA: Diagnosis present

## 2016-11-23 DIAGNOSIS — F411 Generalized anxiety disorder: Secondary | ICD-10-CM | POA: Diagnosis not present

## 2016-11-23 DIAGNOSIS — Z96651 Presence of right artificial knee joint: Secondary | ICD-10-CM | POA: Diagnosis present

## 2016-11-23 DIAGNOSIS — T8131XA Disruption of external operation (surgical) wound, not elsewhere classified, initial encounter: Secondary | ICD-10-CM | POA: Diagnosis not present

## 2016-11-23 DIAGNOSIS — Z9289 Personal history of other medical treatment: Secondary | ICD-10-CM

## 2016-11-23 DIAGNOSIS — Y838 Other surgical procedures as the cause of abnormal reaction of the patient, or of later complication, without mention of misadventure at the time of the procedure: Secondary | ICD-10-CM | POA: Diagnosis not present

## 2016-11-23 DIAGNOSIS — L7634 Postprocedural seroma of skin and subcutaneous tissue following other procedure: Secondary | ICD-10-CM | POA: Diagnosis present

## 2016-11-23 DIAGNOSIS — E118 Type 2 diabetes mellitus with unspecified complications: Secondary | ICD-10-CM | POA: Diagnosis not present

## 2016-11-23 DIAGNOSIS — E669 Obesity, unspecified: Secondary | ICD-10-CM | POA: Diagnosis present

## 2016-11-23 DIAGNOSIS — Z7982 Long term (current) use of aspirin: Secondary | ICD-10-CM

## 2016-11-23 DIAGNOSIS — E785 Hyperlipidemia, unspecified: Secondary | ICD-10-CM | POA: Diagnosis present

## 2016-11-23 DIAGNOSIS — I959 Hypotension, unspecified: Secondary | ICD-10-CM | POA: Diagnosis not present

## 2016-11-23 DIAGNOSIS — Z9911 Dependence on respirator [ventilator] status: Secondary | ICD-10-CM

## 2016-11-23 DIAGNOSIS — Z7984 Long term (current) use of oral hypoglycemic drugs: Secondary | ICD-10-CM

## 2016-11-23 DIAGNOSIS — F329 Major depressive disorder, single episode, unspecified: Secondary | ICD-10-CM | POA: Diagnosis present

## 2016-11-23 DIAGNOSIS — G9340 Encephalopathy, unspecified: Secondary | ICD-10-CM | POA: Diagnosis not present

## 2016-11-23 DIAGNOSIS — Z85038 Personal history of other malignant neoplasm of large intestine: Secondary | ICD-10-CM

## 2016-11-23 DIAGNOSIS — K56609 Unspecified intestinal obstruction, unspecified as to partial versus complete obstruction: Secondary | ICD-10-CM | POA: Diagnosis not present

## 2016-11-23 HISTORY — PX: INCISIONAL HERNIA REPAIR: SHX193

## 2016-11-23 HISTORY — PX: EXCISION OF MESH: SHX6268

## 2016-11-23 LAB — GLUCOSE, CAPILLARY
GLUCOSE-CAPILLARY: 135 mg/dL — AB (ref 65–99)
Glucose-Capillary: 138 mg/dL — ABNORMAL HIGH (ref 65–99)
Glucose-Capillary: 142 mg/dL — ABNORMAL HIGH (ref 65–99)
Glucose-Capillary: 151 mg/dL — ABNORMAL HIGH (ref 65–99)

## 2016-11-23 SURGERY — REMOVAL, MESH, ABDOMEN OR PELVIS
Anesthesia: General | Site: Abdomen

## 2016-11-23 MED ORDER — FENTANYL CITRATE (PF) 250 MCG/5ML IJ SOLN
INTRAMUSCULAR | Status: AC
  Filled 2016-11-23: qty 5

## 2016-11-23 MED ORDER — CITALOPRAM HYDROBROMIDE 20 MG PO TABS
20.0000 mg | ORAL_TABLET | Freq: Three times a day (TID) | ORAL | Status: DC
  Administered 2016-11-23 – 2016-11-24 (×5): 20 mg via ORAL
  Filled 2016-11-23 (×5): qty 1

## 2016-11-23 MED ORDER — HYDRALAZINE HCL 20 MG/ML IJ SOLN: 10.0000 mg | INTRAMUSCULAR | Status: DC | PRN

## 2016-11-23 MED ORDER — ONDANSETRON HCL 4 MG/2ML IJ SOLN
INTRAMUSCULAR | Status: AC
  Filled 2016-11-23: qty 2

## 2016-11-23 MED ORDER — ONDANSETRON HCL 4 MG/2ML IJ SOLN
4.0000 mg | Freq: Four times a day (QID) | INTRAMUSCULAR | Status: DC | PRN
  Administered 2016-11-24 – 2016-12-03 (×10): 4 mg via INTRAVENOUS
  Filled 2016-11-23 (×9): qty 2

## 2016-11-23 MED ORDER — PROMETHAZINE HCL 25 MG/ML IJ SOLN
6.2500 mg | INTRAMUSCULAR | Status: DC | PRN
  Administered 2016-11-23: 6.25 mg via INTRAVENOUS

## 2016-11-23 MED ORDER — SUGAMMADEX SODIUM 200 MG/2ML IV SOLN
INTRAVENOUS | Status: DC | PRN
  Administered 2016-11-23: 200 mg via INTRAVENOUS

## 2016-11-23 MED ORDER — PHENYLEPHRINE 40 MCG/ML (10ML) SYRINGE FOR IV PUSH (FOR BLOOD PRESSURE SUPPORT)
PREFILLED_SYRINGE | INTRAVENOUS | Status: AC
  Filled 2016-11-23: qty 10

## 2016-11-23 MED ORDER — GABAPENTIN 300 MG PO CAPS
ORAL_CAPSULE | ORAL | Status: AC
  Filled 2016-11-23: qty 1

## 2016-11-23 MED ORDER — 0.9 % SODIUM CHLORIDE (POUR BTL) OPTIME
TOPICAL | Status: DC | PRN
  Administered 2016-11-23: 1000 mL

## 2016-11-23 MED ORDER — HYDROMORPHONE 1 MG/ML IV SOLN
INTRAVENOUS | Status: DC
  Administered 2016-11-23: 13:00:00 via INTRAVENOUS
  Administered 2016-11-23: 1.2 mg via INTRAVENOUS
  Administered 2016-11-24: 0.3 mg via INTRAVENOUS
  Administered 2016-11-24: 0.9 mg via INTRAVENOUS
  Administered 2016-11-24: 2.4 mg via INTRAVENOUS
  Administered 2016-11-24 (×2): 0.9 mg via INTRAVENOUS
  Administered 2016-11-24: 1.5 mg via INTRAVENOUS
  Administered 2016-11-25: 0.9 mg via INTRAVENOUS
  Administered 2016-11-25: 0.6 mg via INTRAVENOUS
  Administered 2016-11-25: 1.8 mg via INTRAVENOUS
  Administered 2016-11-25: 1.5 mg via INTRAVENOUS

## 2016-11-23 MED ORDER — INSULIN ASPART 100 UNIT/ML ~~LOC~~ SOLN
0.0000 [IU] | Freq: Three times a day (TID) | SUBCUTANEOUS | Status: DC
  Administered 2016-11-23: 3 [IU] via SUBCUTANEOUS
  Administered 2016-11-24: 2 [IU] via SUBCUTANEOUS
  Administered 2016-11-24 – 2016-11-25 (×2): 3 [IU] via SUBCUTANEOUS

## 2016-11-23 MED ORDER — CEFAZOLIN SODIUM-DEXTROSE 2-4 GM/100ML-% IV SOLN
2.0000 g | Freq: Three times a day (TID) | INTRAVENOUS | Status: AC
  Administered 2016-11-23: 2 g via INTRAVENOUS
  Filled 2016-11-23: qty 100

## 2016-11-23 MED ORDER — MIDAZOLAM HCL 2 MG/2ML IJ SOLN
INTRAMUSCULAR | Status: AC
  Filled 2016-11-23: qty 2

## 2016-11-23 MED ORDER — PANTOPRAZOLE SODIUM 40 MG PO TBEC
40.0000 mg | DELAYED_RELEASE_TABLET | Freq: Two times a day (BID) | ORAL | Status: DC
  Administered 2016-11-23 – 2016-11-24 (×3): 40 mg via ORAL
  Filled 2016-11-23 (×3): qty 1

## 2016-11-23 MED ORDER — LIDOCAINE HCL (CARDIAC) 20 MG/ML IV SOLN
INTRAVENOUS | Status: DC | PRN
  Administered 2016-11-23: 60 mg via INTRAVENOUS
  Administered 2016-11-23: 40 mg via INTRATRACHEAL

## 2016-11-23 MED ORDER — PHENYLEPHRINE HCL 10 MG/ML IJ SOLN
INTRAMUSCULAR | Status: DC | PRN
  Administered 2016-11-23: 40 ug/min via INTRAVENOUS

## 2016-11-23 MED ORDER — OXYCODONE HCL 5 MG PO TABS
5.0000 mg | ORAL_TABLET | ORAL | Status: DC | PRN
  Administered 2016-11-23: 10 mg via ORAL

## 2016-11-23 MED ORDER — HYDROMORPHONE HCL 1 MG/ML IJ SOLN
0.2500 mg | INTRAMUSCULAR | Status: DC | PRN
  Administered 2016-11-23 (×4): 0.5 mg via INTRAVENOUS

## 2016-11-23 MED ORDER — ACETAMINOPHEN 500 MG PO TABS
ORAL_TABLET | ORAL | Status: AC
  Filled 2016-11-23: qty 2

## 2016-11-23 MED ORDER — NALOXONE HCL 0.4 MG/ML IJ SOLN: 0.4000 mg | INTRAMUSCULAR | Status: DC | PRN

## 2016-11-23 MED ORDER — OXYCODONE-ACETAMINOPHEN 5-325 MG PO TABS: 1.0000 | ORAL_TABLET | ORAL | 0 refills | Status: DC | PRN

## 2016-11-23 MED ORDER — PHENYLEPHRINE HCL 10 MG/ML IJ SOLN
INTRAMUSCULAR | Status: DC | PRN
  Administered 2016-11-23 (×3): 80 ug via INTRAVENOUS
  Administered 2016-11-23: 120 ug via INTRAVENOUS

## 2016-11-23 MED ORDER — POTASSIUM CHLORIDE IN NACL 20-0.9 MEQ/L-% IV SOLN
INTRAVENOUS | Status: DC
  Administered 2016-11-23 – 2016-11-25 (×4): via INTRAVENOUS
  Filled 2016-11-23 (×5): qty 1000

## 2016-11-23 MED ORDER — GABAPENTIN 300 MG PO CAPS
300.0000 mg | ORAL_CAPSULE | ORAL | Status: AC
  Administered 2016-11-23: 300 mg via ORAL

## 2016-11-23 MED ORDER — BUPIVACAINE LIPOSOME 1.3 % IJ SUSP
20.0000 mL | INTRAMUSCULAR | Status: AC
  Administered 2016-11-23: 20 mL
  Filled 2016-11-23: qty 20

## 2016-11-23 MED ORDER — BUPIVACAINE-EPINEPHRINE 0.25% -1:200000 IJ SOLN: INTRAMUSCULAR | Status: DC | PRN

## 2016-11-23 MED ORDER — HYDROMORPHONE HCL 1 MG/ML IJ SOLN
INTRAMUSCULAR | Status: AC
  Administered 2016-11-23: 0.5 mg via INTRAVENOUS
  Filled 2016-11-23: qty 1

## 2016-11-23 MED ORDER — PROPOFOL 10 MG/ML IV BOLUS
INTRAVENOUS | Status: DC | PRN
  Administered 2016-11-23: 200 mg via INTRAVENOUS

## 2016-11-23 MED ORDER — GABAPENTIN 300 MG PO CAPS
900.0000 mg | ORAL_CAPSULE | Freq: Three times a day (TID) | ORAL | Status: DC
  Administered 2016-11-23 – 2016-11-24 (×5): 900 mg via ORAL
  Filled 2016-11-23 (×5): qty 3

## 2016-11-23 MED ORDER — ONDANSETRON 4 MG PO TBDP: 4.0000 mg | ORAL_TABLET | Freq: Four times a day (QID) | ORAL | Status: DC | PRN

## 2016-11-23 MED ORDER — HYDROMORPHONE 1 MG/ML IV SOLN
INTRAVENOUS | Status: AC
  Filled 2016-11-23: qty 25

## 2016-11-23 MED ORDER — ACETAMINOPHEN 500 MG PO TABS
1000.0000 mg | ORAL_TABLET | ORAL | Status: AC
  Administered 2016-11-23: 1000 mg via ORAL

## 2016-11-23 MED ORDER — ROCURONIUM BROMIDE 100 MG/10ML IV SOLN
INTRAVENOUS | Status: DC | PRN
  Administered 2016-11-23: 10 mg via INTRAVENOUS
  Administered 2016-11-23: 60 mg via INTRAVENOUS
  Administered 2016-11-23: 30 mg via INTRAVENOUS

## 2016-11-23 MED ORDER — METFORMIN HCL 500 MG PO TABS
500.0000 mg | ORAL_TABLET | Freq: Two times a day (BID) | ORAL | Status: DC
  Administered 2016-11-23 – 2016-11-25 (×2): 500 mg via ORAL
  Filled 2016-11-23 (×2): qty 1

## 2016-11-23 MED ORDER — CELECOXIB 200 MG PO CAPS
ORAL_CAPSULE | ORAL | Status: AC
  Filled 2016-11-23: qty 2

## 2016-11-23 MED ORDER — FENTANYL CITRATE (PF) 100 MCG/2ML IJ SOLN
INTRAMUSCULAR | Status: DC | PRN
  Administered 2016-11-23: 100 ug via INTRAVENOUS
  Administered 2016-11-23 (×4): 25 ug via INTRAVENOUS

## 2016-11-23 MED ORDER — PROPOFOL 10 MG/ML IV BOLUS
INTRAVENOUS | Status: AC
  Filled 2016-11-23: qty 20

## 2016-11-23 MED ORDER — INSULIN ASPART 100 UNIT/ML ~~LOC~~ SOLN
0.0000 [IU] | Freq: Every day | SUBCUTANEOUS | Status: DC
Start: 2016-11-23 — End: 2016-11-25

## 2016-11-23 MED ORDER — ENOXAPARIN SODIUM 40 MG/0.4ML ~~LOC~~ SOLN
40.0000 mg | SUBCUTANEOUS | Status: DC
  Administered 2016-11-24 – 2016-11-25 (×2): 40 mg via SUBCUTANEOUS
  Filled 2016-11-23 (×2): qty 0.4

## 2016-11-23 MED ORDER — CELECOXIB 200 MG PO CAPS
400.0000 mg | ORAL_CAPSULE | ORAL | Status: AC
  Administered 2016-11-23: 400 mg via ORAL

## 2016-11-23 MED ORDER — METOPROLOL TARTRATE 5 MG/5ML IV SOLN: 5.0000 mg | Freq: Four times a day (QID) | INTRAVENOUS | Status: DC | PRN

## 2016-11-23 MED ORDER — SODIUM CHLORIDE 0.9 % IJ SOLN
INTRAMUSCULAR | Status: AC
  Filled 2016-11-23: qty 10

## 2016-11-23 MED ORDER — SODIUM CHLORIDE 0.9% FLUSH: 9.0000 mL | INTRAVENOUS | Status: DC | PRN

## 2016-11-23 MED ORDER — DIPHENHYDRAMINE HCL 12.5 MG/5ML PO ELIX: 12.5000 mg | ORAL_SOLUTION | Freq: Four times a day (QID) | ORAL | Status: DC | PRN

## 2016-11-23 MED ORDER — PROMETHAZINE HCL 25 MG/ML IJ SOLN
INTRAMUSCULAR | Status: AC
  Administered 2016-11-23: 6.25 mg via INTRAVENOUS
  Filled 2016-11-23: qty 1

## 2016-11-23 MED ORDER — LACTATED RINGERS IV SOLN
INTRAVENOUS | Status: DC
  Administered 2016-11-23 (×2): via INTRAVENOUS

## 2016-11-23 MED ORDER — OXYCODONE HCL 5 MG PO TABS
ORAL_TABLET | ORAL | Status: AC
Start: 2016-11-23 — End: 2016-11-23
  Administered 2016-11-23: 10 mg via ORAL
  Filled 2016-11-23: qty 1

## 2016-11-23 MED ORDER — SIMETHICONE 80 MG PO CHEW: 40.0000 mg | CHEWABLE_TABLET | Freq: Four times a day (QID) | ORAL | Status: DC | PRN

## 2016-11-23 MED ORDER — ONDANSETRON HCL 4 MG/2ML IJ SOLN
4.0000 mg | Freq: Four times a day (QID) | INTRAMUSCULAR | Status: DC | PRN
  Filled 2016-11-23: qty 2

## 2016-11-23 MED ORDER — MIDAZOLAM HCL 5 MG/5ML IJ SOLN
INTRAMUSCULAR | Status: DC | PRN
  Administered 2016-11-23: 2 mg via INTRAVENOUS

## 2016-11-23 MED ORDER — BUPIVACAINE-EPINEPHRINE (PF) 0.25% -1:200000 IJ SOLN
INTRAMUSCULAR | Status: AC
  Filled 2016-11-23: qty 30

## 2016-11-23 MED ORDER — ARTIFICIAL TEARS OPHTHALMIC OINT
TOPICAL_OINTMENT | OPHTHALMIC | Status: AC
  Filled 2016-11-23: qty 3.5

## 2016-11-23 MED ORDER — METHOCARBAMOL 500 MG PO TABS
ORAL_TABLET | ORAL | Status: AC
  Administered 2016-11-23: 500 mg via ORAL
  Filled 2016-11-23: qty 1

## 2016-11-23 MED ORDER — CHLORHEXIDINE GLUCONATE CLOTH 2 % EX PADS: 6.0000 | MEDICATED_PAD | Freq: Once | CUTANEOUS | Status: DC

## 2016-11-23 MED ORDER — DOCUSATE SODIUM 100 MG PO CAPS
100.0000 mg | ORAL_CAPSULE | Freq: Two times a day (BID) | ORAL | Status: DC
  Administered 2016-11-23 – 2016-11-24 (×2): 100 mg via ORAL
  Filled 2016-11-23 (×2): qty 1

## 2016-11-23 MED ORDER — OXYCODONE HCL 5 MG PO TABS
ORAL_TABLET | ORAL | Status: AC
  Filled 2016-11-23: qty 1

## 2016-11-23 MED ORDER — SUGAMMADEX SODIUM 200 MG/2ML IV SOLN
INTRAVENOUS | Status: AC
  Filled 2016-11-23: qty 2

## 2016-11-23 MED ORDER — METHOCARBAMOL 500 MG PO TABS
500.0000 mg | ORAL_TABLET | Freq: Four times a day (QID) | ORAL | Status: DC | PRN
  Administered 2016-11-23 – 2016-11-24 (×5): 500 mg via ORAL
  Filled 2016-11-23 (×4): qty 1

## 2016-11-23 MED ORDER — DIPHENHYDRAMINE HCL 50 MG/ML IJ SOLN: 12.5000 mg | Freq: Four times a day (QID) | INTRAMUSCULAR | Status: DC | PRN

## 2016-11-23 SURGICAL SUPPLY — 45 items
BINDER ABDOMINAL 12 ML 46-62 (SOFTGOODS) ×4 IMPLANT
CANISTER SUCT 3000ML PPV (MISCELLANEOUS) ×4 IMPLANT
CHLORAPREP W/TINT 26ML (MISCELLANEOUS) ×4 IMPLANT
CONT SPEC 4OZ CLIKSEAL STRL BL (MISCELLANEOUS) ×8 IMPLANT
COVER SURGICAL LIGHT HANDLE (MISCELLANEOUS) ×4 IMPLANT
DERMABOND ADVANCED (GAUZE/BANDAGES/DRESSINGS)
DERMABOND ADVANCED .7 DNX12 (GAUZE/BANDAGES/DRESSINGS) IMPLANT
DRAIN CHANNEL 19F RND (DRAIN) ×4 IMPLANT
DRAPE LAPAROSCOPIC ABDOMINAL (DRAPES) ×4 IMPLANT
DRSG OPSITE POSTOP 4X10 (GAUZE/BANDAGES/DRESSINGS) ×4 IMPLANT
DRSG PAD ABDOMINAL 8X10 ST (GAUZE/BANDAGES/DRESSINGS) IMPLANT
ELECT CAUTERY BLADE 6.4 (BLADE) ×4 IMPLANT
ELECT REM PT RETURN 9FT ADLT (ELECTROSURGICAL) ×4
ELECTRODE REM PT RTRN 9FT ADLT (ELECTROSURGICAL) ×2 IMPLANT
EVACUATOR SILICONE 100CC (DRAIN) ×4 IMPLANT
GAUZE SPONGE 4X4 12PLY STRL (GAUZE/BANDAGES/DRESSINGS) IMPLANT
GAUZE SPONGE 4X4 12PLY STRL LF (GAUZE/BANDAGES/DRESSINGS) ×4 IMPLANT
GLOVE BIO SURGEON STRL SZ7.5 (GLOVE) ×4 IMPLANT
GLOVE BIO SURGEON STRL SZ8 (GLOVE) ×4 IMPLANT
GLOVE BIOGEL PI IND STRL 7.0 (GLOVE) ×4 IMPLANT
GLOVE BIOGEL PI IND STRL 8 (GLOVE) ×4 IMPLANT
GLOVE BIOGEL PI INDICATOR 7.0 (GLOVE) ×4
GLOVE BIOGEL PI INDICATOR 8 (GLOVE) ×4
GOWN STRL REUS W/ TWL LRG LVL3 (GOWN DISPOSABLE) ×2 IMPLANT
GOWN STRL REUS W/ TWL XL LVL3 (GOWN DISPOSABLE) ×2 IMPLANT
GOWN STRL REUS W/TWL LRG LVL3 (GOWN DISPOSABLE) ×2
GOWN STRL REUS W/TWL XL LVL3 (GOWN DISPOSABLE) ×2
KIT BASIN OR (CUSTOM PROCEDURE TRAY) ×4 IMPLANT
KIT ROOM TURNOVER OR (KITS) ×4 IMPLANT
NEEDLE HYPO 25GX1X1/2 BEV (NEEDLE) IMPLANT
NS IRRIG 1000ML POUR BTL (IV SOLUTION) ×4 IMPLANT
PACK GENERAL/GYN (CUSTOM PROCEDURE TRAY) ×4 IMPLANT
PAD ABD 8X10 STRL (GAUZE/BANDAGES/DRESSINGS) ×4 IMPLANT
PAD ARMBOARD 7.5X6 YLW CONV (MISCELLANEOUS) ×4 IMPLANT
STAPLER VISISTAT 35W (STAPLE) ×4 IMPLANT
SUT ETHILON 2 0 FS 18 (SUTURE) ×4 IMPLANT
SUT MON AB 4-0 PC3 18 (SUTURE) IMPLANT
SUT NOVA NAB DX-16 0-1 5-0 T12 (SUTURE) ×4 IMPLANT
SUT NOVA T20/GS 25 (SUTURE) ×40 IMPLANT
SUT PDS AB 1 CTX 36 (SUTURE) ×8 IMPLANT
SYR CONTROL 10ML LL (SYRINGE) IMPLANT
TAPE CLOTH SURG 6X10 WHT LF (GAUZE/BANDAGES/DRESSINGS) ×4 IMPLANT
TOWEL OR 17X24 6PK STRL BLUE (TOWEL DISPOSABLE) IMPLANT
TOWEL OR 17X26 10 PK STRL BLUE (TOWEL DISPOSABLE) ×4 IMPLANT
TRAY FOLEY W/METER SILVER 16FR (SET/KITS/TRAYS/PACK) IMPLANT

## 2016-11-23 NOTE — Anesthesia Procedure Notes (Signed)
Procedure Name: Intubation Date/Time: 11/23/2016 9:33 AM Performed by: Shirlyn Goltz Pre-anesthesia Checklist: Patient identified, Emergency Drugs available, Suction available and Patient being monitored Patient Re-evaluated:Patient Re-evaluated prior to induction Oxygen Delivery Method: Circle system utilized Preoxygenation: Pre-oxygenation with 100% oxygen Induction Type: IV induction Ventilation: Mask ventilation without difficulty and Oral airway inserted - appropriate to patient size Laryngoscope Size: Mac and 3 Grade View: Grade I Tube type: Oral Tube size: 7.0 mm Number of attempts: 1 Airway Equipment and Method: Stylet and LTA kit utilized Placement Confirmation: ETT inserted through vocal cords under direct vision,  positive ETCO2 and breath sounds checked- equal and bilateral Secured at: 21 cm Tube secured with: Tape Dental Injury: Teeth and Oropharynx as per pre-operative assessment

## 2016-11-23 NOTE — Interval H&P Note (Signed)
History and Physical Interval Note:  11/23/2016 8:49 AM  Krystal Campbell  has presented today for surgery, with the diagnosis of chronic seroma  The various methods of treatment have been discussed with the patient and family. After consideration of risks, benefits and other options for treatment, the patient has consented to  Procedure(s): REMOVAL OF MESH FROM ABDOMINAL WALL (N/A) HERNIA REPAIR INCISIONAL (N/A) INSERTION OF MESH (N/A) as a surgical intervention .  The patient's history has been reviewed, patient examined, no change in status, stable for surgery.  I have reviewed the patient's chart and labs.  Questions were answered to the patient's satisfaction.     Gor Vestal A.

## 2016-11-23 NOTE — Anesthesia Postprocedure Evaluation (Signed)
Anesthesia Post Note  Patient: Krystal Campbell  Procedure(s) Performed: Procedure(s) (LRB): REMOVAL OF MESH FROM ABDOMINAL WALL (N/A) HERNIA REPAIR INCISIONAL (N/A)     Patient location during evaluation: PACU Anesthesia Type: General Level of consciousness: awake and alert Pain management: pain level controlled Vital Signs Assessment: post-procedure vital signs reviewed and stable Respiratory status: spontaneous breathing, nonlabored ventilation, respiratory function stable and patient connected to nasal cannula oxygen Cardiovascular status: blood pressure returned to baseline and stable Postop Assessment: no signs of nausea or vomiting Anesthetic complications: no    Last Vitals:  Vitals:   11/23/16 1257 11/23/16 1315  BP:  (!) 146/75  Pulse:  86  Resp: 14 11  Temp:    SpO2: 95% 94%    Last Pain:  Vitals:   11/23/16 1315  TempSrc:   PainSc: 0-No pain                 Lyndsi Altic S

## 2016-11-23 NOTE — Op Note (Signed)
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  Asst.: Dr. Nadeen Landau M.D.  EBL: 100 mL  Drains: 19 round drain to subcutaneous tissues  Specimen: Seroma cavity and old mesh to pathology  Anesthesia: Gen. With local using Exparel   Indications for procedure: The patient's a2-year-old female who's had multiple abdominal operations and had a incisional hernia repair back in October 2017. Her postop course complicated by  a chronic seroma was treated by percutaneous drainage  Alcohol ablation, open excision with talc instillation. Unfortunately, this is not resolved or seroma. Discussed options at this point, the excision with removal of her mesh and primary closure. Risks, benefits and alternatives to surgery were discussed with the patient. I discussed use of mesh with the patient and husband today in the fact that he may closer primarily secondary to the issue she is having are present mesh.The procedure has been discussed with the patient.  Alternative therapies have been discussed with the patient.  Operative risks include bleeding,  Infection,  Organ injury,  Nerve injury,  Blood vessel injury,  DVT,  Pulmonary embolism,  Death,  And possible reoperation.  Medical management risks include worsening of present situation.  The success of the procedure is 50 -90 % at treating patients symptoms.  The patient understands and agrees to proceed.   Description of procedure: The patient was met in the holding area and questions are answered. She's taken back the operating room placed upon the OR table. After induction of general anesthesia her abdomen was prepped and draped in a sterile fashion. Timeout was done in preoperative antibiotics given.  The old scar was excised. We dissected down to  subcutaneous fat into a chronic seroma cavity it was anterior to the abdominal wall fascia. There is a second collection deeper to that. We open this up. I then opened the fascia above and below the mesh. The old fascia was debrided back. The old mesh was excised in its entirety and passed off the field. The old seroma cavity was excised in its entirety including probably abdominal wall. This left a defect. I released the transversus muscle laterally bilaterally to bring together the abdominal wall with no tension. This was done for approximately 10 cm on both sides. Irrigation was used and suctioned out. The omentum and bowel are examined and no signs of injury were noted. I opted to close the fascia with #1 Novafil interrupted suture. Through separate stab incision a 19 round drain was placed in the subcutaneous fat. This was secure with 2-0 nylon. Irrigation was used and suctioned out. Hemostasis was excellent. I closed the skin with staples and dry dressings were applied. Of note Exparel was diluted with 20 mL of saline injected along both sides abdominal wall. All final counts are found to be correct. Binder placed. Suction placed to bulb. Patient awoke, extubated taken recovery in satisfactory condition.

## 2016-11-23 NOTE — Transfer of Care (Signed)
Immediate Anesthesia Transfer of Care Note  Patient: Krystal Campbell  Procedure(s) Performed: Procedure(s): REMOVAL OF MESH FROM ABDOMINAL WALL (N/A) HERNIA REPAIR INCISIONAL (N/A)  Patient Location: PACU  Anesthesia Type:General  Level of Consciousness: awake, alert , oriented and patient cooperative  Airway & Oxygen Therapy: Patient Spontanous Breathing and Patient connected to nasal cannula oxygen  Post-op Assessment: Report given to RN and Post -op Vital signs reviewed and stable  Post vital signs: Reviewed and stable  Last Vitals:  Vitals:   11/23/16 0817 11/23/16 1142  BP: (!) 153/74   Pulse: 79   Resp: 20   Temp: 37 C (P) 36.7 C  SpO2: 99%     Last Pain:  Vitals:   11/23/16 0823  TempSrc:   PainSc: 7       Patients Stated Pain Goal: 6 (08/67/61 9509)  Complications: No apparent anesthesia complications

## 2016-11-23 NOTE — Progress Notes (Signed)
Report received from [facility] in PACU.

## 2016-11-23 NOTE — Discharge Instructions (Signed)
[facility] _______[facility] ° °UMBILICAL OR INGUINAL HERNIA REPAIR: POST OP INSTRUCTIONS ° °Always review your discharge instruction sheet given to you by the facility where your surgery was performed. °IF YOU HAVE DISABILITY OR FAMILY LEAVE FORMS, YOU MUST BRING THEM TO THE OFFICE FOR PROCESSING.   °DO NOT GIVE THEM TO YOUR DOCTOR. ° °1. A  prescription for pain medication may be given to you upon discharge.  Take your pain medication as prescribed, if needed.  If narcotic pain medicine is not needed, then you may take acetaminophen (Tylenol) or ibuprofen (Advil) as needed. °2. Take your usually prescribed medications unless otherwise directed. °If you need a refill on your pain medication, please contact your pharmacy.  They will contact our office to request authorization. Prescriptions will not be filled after 5 pm or on week-ends. °3. You should follow a light diet the first 24 hours after arrival home, such as soup and crackers, etc.  Be sure to include lots of fluids daily.  Resume your normal diet the day after surgery. °4.Most patients will experience some swelling and bruising around the umbilicus or in the groin and scrotum.  Ice packs and reclining will help.  Swelling and bruising can take several days to resolve.  °6. It is common to experience some constipation if taking pain medication after surgery.  Increasing fluid intake and taking a stool softener (such as Colace) will usually help or prevent this problem from occurring.  A mild laxative (Milk of Magnesia or Miralax) should be taken according to package directions if there are no bowel movements after 48 hours. °7. Unless discharge instructions indicate otherwise, you may remove your bandages 24-48 hours after surgery, and you may shower at that time.  You may have steri-strips (small skin tapes) in place directly over the incision.  These strips should be left on the skin for 7-10 days.  If your surgeon used skin glue on the  incision, you may shower in 24 hours.  The glue will flake off over the next 2-3 weeks.  Any sutures or staples will be removed at the office during your follow-up visit. °8. ACTIVITIES:  You may resume regular (light) daily activities beginning the next day--such as daily self-care, walking, climbing stairs--gradually increasing activities as tolerated.  You may have sexual intercourse when it is comfortable.  Refrain from any heavy lifting or straining until approved by your doctor. ° °a.You may drive when you are no longer taking prescription pain medication, you can comfortably wear a seatbelt, and you can safely maneuver your car and apply brakes. °b.RETURN TO WORK:   °_____________________________________________ ° °9.You should see your doctor in the office for a follow-up appointment approximately 2-3 weeks after your surgery.  Make sure that you call for this appointment within a day or two after you arrive home to insure a convenient appointment time. °10.OTHER INSTRUCTIONS: _________________________ °   _____________________________________ ° °WHEN TO CALL YOUR DOCTOR: °1. Fever over 101.0 °2. Inability to urinate °3. Nausea and/or vomiting °4. Extreme swelling or bruising °5. Continued bleeding from incision. °6. Increased pain, redness, or drainage from the incision ° °The clinic staff is available to answer your questions during regular business hours.  Please don’t hesitate to call and ask to speak to one of the nurses for clinical concerns.  If you have a medical emergency, go to the nearest emergency room or call 911.  A surgeon from [facility] is always on call at the hospital ° ° °  [address] [place], [place], [place]  [address] ?  [address], [place], [place]   [phone] ? [phone] ? FAX [phone] Web site: [email or web address] Surgical drains are used to remove extra fluid that normally builds  up in a surgical wound after surgery. A surgical drain helps to heal a surgical wound. Different kinds of surgical drains include:  Active drains. These drains use suction to pull drainage away from the surgical wound. Drainage flows through a tube to a container outside of the body. It is important to keep the bulb or the drainage container flat (compressed) at all times, except while you empty it. Flattening the bulb or container creates suction. The two most common types of active drains are bulb drains and Hemovac drains.  Passive drains. These drains allow fluid to drain naturally, by gravity. Drainage flows through a tube to a bandage (dressing) or a container outside of the body. Passive drains do not need to be emptied. The most common type of passive drain is the Penrose drain.  A drain is placed during surgery. Immediately after surgery, drainage is usually bright red and a little thicker than water. The drainage may gradually turn yellow or pink and become thinner. It is likely that your health care provider will remove the drain when the drainage stops or when the amount decreases to 1-2 Tbsp (15-30 mL) during a 24-hour period. How to care for your surgical drain  Keep the skin around the drain dry and covered with a dressing at all times.  Check your drain area every day for signs of infection. Check for: ? More redness, swelling, or pain. ? Pus or a bad smell. ? Cloudy drainage. Follow instructions from your health care provider about how to take care of your drain and how to change your dressing. Change your dressing at least one time every day. Change it more often if needed to keep the dressing dry. Make sure you: 1. Gather your supplies, including: ? Tape. ? Germ-free cleaning solution (sterile saline). ? Split gauze drain sponge: 4 x 4 inches (10 x 10 cm). ? Gauze square: 4 x 4 inches (10 x 10 cm). 2. Wash your hands with soap and water before you change your dressing. If soap  and water are not available, use hand sanitizer. 3. Remove the old dressing. Avoid using scissors to do that. 4. Use sterile saline to clean your skin around the drain. 5. Place the tube through the slit in a drain sponge. Place the drain sponge so that it covers your wound. 6. Place the gauze square or another drain sponge on top of the drain sponge that is on the wound. Make sure the tube is between those layers. 7. Tape the dressing to your skin. 8. If you have an active bulb or Hemovac drain, tape the drainage tube to your skin 1-2 inches (2.5-5 cm) below the place where the tube enters your body. Taping keeps the tube from pulling on any stitches (sutures) that you have. 9. Wash your hands with soap and water. 10. Write down the color of your drainage and how often you change your dressing.  How to empty your active bulb or Hemovac drain 1. Make sure that you have a measuring cup that you can empty your drainage into. 2. Wash your hands with soap and water. If soap and water are not available, use hand sanitizer. 3. Gently move your  fingers down the tube while squeezing very lightly. This is called stripping the tube. This clears any drainage, clots, or tissue from the tube. ? Do not pull on the tube. ? You may need to strip the tube several times every day to keep the tube clear. 4. Open the bulb cap or the drain plug. Do not touch the inside of the cap or the bottom of the plug. 5. Empty all of the drainage into the measuring cup. 6. Compress the bulb or the container and replace the cap or the plug. To compress the bulb or the container, squeeze it firmly in the middle while you close the cap or plug the container. 7. Write down the amount of drainage that you have in each 24-hour period. If you have less than 2 Tbsp (30 mL) of drainage during 24 hours, contact your health care provider. 8. Flush the drainage down the toilet. 9. Wash your hands with soap and water. Contact a health care  provider if:  You have more redness, swelling, or pain around your drain area.  The amount of drainage that you have is increasing instead of decreasing.  You have pus or a bad smell coming from your drain area.  You have a fever.  You have drainage that is cloudy.  There is a sudden stop or a sudden decrease in the amount of drainage that you have.  Your tube falls out.  Your active draindoes not stay compressedafter you empty it. This information is not intended to replace advice given to you by your health care provider. Make sure you discuss any questions you have with your health care provider. Document Released: 03/10/2000 Document Revised: 08/19/2015 Document Reviewed: 09/30/2014 Elsevier Interactive Patient Education  2018 [facility], [place] [phone]  OPEN ABDOMINAL SURGERY: POST OP INSTRUCTIONS  Always review your discharge instruction sheet given to you by the facility where your surgery was performed.  IF YOU HAVE DISABILITY OR FAMILY LEAVE FORMS, YOU MUST BRING THEM TO THE OFFICE FOR PROCESSING.  PLEASE DO NOT GIVE THEM TO YOUR DOCTOR.  1. A prescription for pain medication may be given to you upon discharge.  Take your pain medication as prescribed, if needed.  If narcotic pain medicine is not needed, then you may take acetaminophen (Tylenol) or ibuprofen (Advil) as needed. 2. Take your usually prescribed medications unless otherwise directed. 3. If you need a refill on your pain medication, please contact your pharmacy. They will contact our office to request authorization.  Prescriptions will not be filled after 5pm or on week-ends. 4. You should follow a light diet the first few days after arrival home, such as soup and crackers, pudding, etc.unless your doctor has advised otherwise. A high-fiber, low fat diet can be resumed as tolerated.   Be sure to include lots of fluids daily. Most patients will experience some swelling  and bruising on the chest and neck area.  Ice packs will help.  Swelling and bruising can take several days to resolve 5. Most patients will experience some swelling and bruising in the area of the incision. Ice pack will help. Swelling and bruising can take several days to resolve..  6. It is common to experience some constipation if taking pain medication after surgery.  Increasing fluid intake and taking a stool softener will usually help or prevent this problem from occurring.  A mild laxative (Milk of Magnesia or Miralax) should be taken according  to package directions if there are no bowel movements after 48 hours. 7.  You may have steri-strips (small skin tapes) in place directly over the incision.  These strips should be left on the skin for 7-10 days.  If your surgeon used skin glue on the incision, you may shower in 24 hours.  The glue will flake off over the next 2-3 weeks.  Any sutures or staples will be removed at the office during your follow-up visit. You may find that a light gauze bandage over your incision may keep your staples from being rubbed or pulled. You may shower and replace the bandage daily. 8. ACTIVITIES:  You may resume regular (light) daily activities beginning the next day--such as daily self-care, walking, climbing stairs--gradually increasing activities as tolerated.  You may have sexual intercourse when it is comfortable.  Refrain from any heavy lifting or straining until approved by your doctor. a. You may drive when you no longer are taking prescription pain medication, you can comfortably wear a seatbelt, and you can safely maneuver your car and apply brakes b. Return to Work: ___________________________________ 60. You should see your doctor in the office for a follow-up appointment approximately two weeks after your surgery.  Make sure that you call for this appointment within a day or two after you arrive home to insure a convenient appointment time. OTHER  INSTRUCTIONS:  _____________________________________________________________ _____________________________________________________________  WHEN TO CALL YOUR DOCTOR: 7. Fever over 101.0 8. Inability to urinate 9. Nausea and/or vomiting 10. Extreme swelling or bruising 11. Continued bleeding from incision. 12. Increased pain, redness, or drainage from the incision. 13. Difficulty swallowing or breathing 14. Muscle cramping or spasms. 15. Numbness or tingling in hands or feet or around lips.  The clinic staff is available to answer your questions during regular business hours.  Please dont hesitate to call and ask to speak to one of the nurses if you have concerns.  For further questions, please visit [email or web address]

## 2016-11-23 NOTE — Progress Notes (Signed)
Lunch relief by Elvera Bicker RN

## 2016-11-23 NOTE — Anesthesia Preprocedure Evaluation (Signed)
Anesthesia Evaluation  Patient identified by MRN, date of birth, ID band Patient awake    Reviewed: Allergy & Precautions, NPO status , Patient's Chart, lab work & pertinent test results  Airway Mallampati: II  TM Distance: <3 FB Neck ROM: Full    Dental no notable dental hx.    Pulmonary neg pulmonary ROS, former smoker,    Pulmonary exam normal breath sounds clear to auscultation       Cardiovascular hypertension, + DVT  Normal cardiovascular exam Rhythm:Regular Rate:Normal     Neuro/Psych negative neurological ROS  negative psych ROS   GI/Hepatic negative GI ROS, Neg liver ROS, GERD  ,  Endo/Other  diabetesMorbid obesity  Renal/GU negative Renal ROS  negative genitourinary   Musculoskeletal negative musculoskeletal ROS (+)   Abdominal   Peds negative pediatric ROS (+)  Hematology negative hematology ROS (+)   Anesthesia Other Findings   Reproductive/Obstetrics negative OB ROS                             Anesthesia Physical Anesthesia Plan  ASA: III  Anesthesia Plan: General   Post-op Pain Management:    Induction: Intravenous  PONV Risk Score and Plan: 2 and Ondansetron and Dexamethasone  Airway Management Planned: Oral ETT  Additional Equipment:   Intra-op Plan:   Post-operative Plan: Extubation in OR  Informed Consent: I have reviewed the patients History and Physical, chart, labs and discussed the procedure including the risks, benefits and alternatives for the proposed anesthesia with the patient or authorized representative who has indicated his/her understanding and acceptance.   Dental advisory given  Plan Discussed with: CRNA and Surgeon  Anesthesia Plan Comments:         Anesthesia Quick Evaluation

## 2016-11-23 NOTE — H&P (View-Only) (Signed)
Krystal Campbell 11/13/2016 1:40 PM Location: [facility] Patient #: [id number] DOB: Jan 24, 1961 Married / Language: Cleophus Campbell / Race: White Female  History of Present Illness [name] A. 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 She said to procedures to drain this percutaneous sclerotherapy and 1 open. She has subsequent talc sclerosis as well. Enforcing, she has failed all these seromas keep recurring. She does have pain from this but no fever or chills. The pain is sharp located at the site of the seroma she states. There's been no drainage.  CLINICAL DATA: Status post seroma drainage. EXAM: CT ABDOMEN AND PELVIS WITH CONTRAST TECHNIQUE: Multidetector CT imaging of the abdomen and pelvis was performed using the standard protocol following bolus administration of intravenous contrast. CONTRAST: 148mL ISOVUE-300 IOPAMIDOL (ISOVUE-300) INJECTION 61% COMPARISON: CT scan of Jul 28, 2016. FINDINGS: Lower chest: No acute abnormality. Hepatobiliary: No focal liver abnormality is seen. No gallstones, gallbladder wall thickening, or biliary dilatation. Pancreas: Unremarkable. No pancreatic ductal dilatation or surrounding inflammatory changes. Spleen: Stable calcified splenic granulomata are noted. Adrenals/Urinary Tract: Adrenal glands appear normal. Stable postsurgical changes are seen involving the right kidney. Left kidney appears normal. No hydronephrosis or renal obstruction is noted. No renal or ureteral calculi are noted. Urinary bladder is unremarkable. Stomach/Bowel: The stomach appears normal. Diverticulosis of descending and sigmoid colon is noted without inflammation. There is no evidence of bowel obstruction. The appendix is not visualized. Vascular/Lymphatic: No significant vascular findings are present. No enlarged abdominal or pelvic lymph nodes. Reproductive: Uterus and bilateral adnexa are  unremarkable. Bilateral ligation clips are noted. Other: The postoperative seroma seen along anterior abdominal wall prior exam is significantly smaller, currently measuring 9.6 x 4.2 x 3.2 cm. However, there is the interval development of another seroma of immediately anterior to the previously described 1. This seroma measures 9.1 x 7.0 x 4.7 cm. Musculoskeletal: No acute or significant osseous findings. IMPRESSION: Stable postoperative changes are seen involving the right kidney. Diverticulosis of descending and sigmoid colon is noted without inflammation. Anterior abdominal wall seroma noted on previous cm is significantly smaller, currently measuring 9.6 x 4.2 x 3.2 cm. However, there is interval development of a new seroma immediately anterior to the previously described seroma, measuring 9.1 x 7.0 x 4.7 cm. Electronically Signed By: Marijo Conception, M.D. On: 10/17/2016 15:50 .  The patient is a 56 year old female.   Allergies [name], RMA; 11/13/2016 1:40 PM) Allergies Reconciled No Known Drug Allergies 11/17/2014 No Known Allergies 08/01/2016  Medication History [name], RMA; 11/13/2016 1:40 PM) Citalopram Hydrobromide (20MG  Tablet, Oral daily) Active. Fiber (625MG  Tablet, Oral two times daily) Active. Aspirin EC Low Dose (81MG  Tablet DR, Oral daily) Active. Tricor (145MG  Tablet, Oral daily) Active. Melatonin (10MG  Capsule, Oral daily) Active. (Take two capsules at bedtime.) Protonix (20MG  Tablet DR, Oral two times daily) Active. Vitamin B12 (1000MCG Tablet ER, Oral) Active. Gabapentin (800MG  Tablet, Oral three times daily) Active. Medications Reconciled    Vitals [name] RMA; 11/13/2016 1:41 PM) 11/13/2016 1:40 PM Weight: 258.4 lb Height: 64in Body Surface Area: 2.18 m Body Mass Index: 44.35 kg/m  Temp.: 65F  Pulse: 90 (Regular)  BP: 132/80 (Sitting, Left Arm, Standard)      Physical Exam (Martavia Tye A.  Coyle Stordahl MD; 11/13/2016 3:30 PM)  Abdomen Note: Persistent periumbilical seroma noted to have previous hernia repair site. Tender but not having any signs of peritonitis.  Neurologic Neurologic evaluation reveals -alert and  oriented x 3 with no impairment of recent or remote memory. Mental Status-Normal.  Musculoskeletal Normal Exam - Left-Upper Extremity Strength Normal and Lower Extremity Strength Normal. Normal Exam - Right-Upper Extremity Strength Normal, Lower Extremity Weakness.    Assessment & Plan (Rueben Kassim A. Raniah Karan MD; 11/13/2016 3:30 PM)  ABDOMINAL WALL SEROMA (T88.8XXA) Impression: Long discussion about options today. CT scan shows work origin new seroma cavities. She's had sclerotherapy both by catheter and open. There's been no infection but she still has pain from this area. I suspect her underlying mesh is the problem. I recommended mesh explantation with repair either with primary suture closure versus the use of biologic mesh to repair this. Risk of bleeding, infection, bowel injury, recurrence rates of up to 25%, or injury, and complications of underlying medical conditions. The risk of hernia repair include bleeding, infection, organ injury, bowel injury, bladder injury, nerve injury recurrent hernia, blood clots, worsening of underlying condition, chronic pain, mesh use, open surgery, death, and the need for other operattions. Pt agrees to proceed  Current Plans You are being scheduled for surgery- Our schedulers will call you.  You should hear from our office's scheduling department within 5 working days about the location, date, and time of surgery. We try to make accommodations for patient's preferences in scheduling surgery, but sometimes the OR schedule or the surgeon's schedule prevents [place] from making those accommodations.  If you have not heard from our office [phone]) in 5 working days, call the office and ask for your surgeon's nurse.  If you  have other questions about your diagnosis, plan, or surgery, call the office and ask for your surgeon's nurse.  The anatomy & physiology of the abdominal wall was discussed. The pathophysiology of hernias was discussed. Natural history risks without surgery including progeressive enlargement, pain, incarceration, & strangulation was discussed. Contributors to complications such as smoking, obesity, diabetes, prior surgery, etc were discussed.  I feel the risks of no intervention will lead to serious problems that outweigh the operative risks; therefore, I recommended surgery to reduce and repair the hernia. I explained laparoscopic techniques with possible need for an open approach. I noted the probable use of mesh to patch and/or buttress the hernia repair  Risks such as bleeding, infection, abscess, need for further treatment, heart attack, death, and other risks were discussed. I noted a good likelihood this will help address the problem. Goals of post-operative recovery were discussed as well. Possibility that this will not correct all symptoms was explained. I stressed the importance of low-impact activity, aggressive pain control, avoiding constipation, & not pushing through pain to minimize risk of post-operative chronic pain or injury. Possibility of reherniation especially with smoking, obesity, diabetes, immunosuppression, and other health conditions was discussed. We will work to minimize complications.  An educational handout further explaining the pathology & treatment options was given as well. Questions were answered. The patient expresses understanding & wishes to proceed with surgery.  Pt Education - Pamphlet Given - Hernia Surgery: discussed with patient and provided information. INCISIONAL HERNIA, WITHOUT OBSTRUCTION OR GANGRENE (K43.2)

## 2016-11-24 ENCOUNTER — Encounter (HOSPITAL_COMMUNITY): Payer: Self-pay | Admitting: Surgery

## 2016-11-24 LAB — COMPREHENSIVE METABOLIC PANEL
ALBUMIN: 2.9 g/dL — AB (ref 3.5–5.0)
ALT: 11 U/L — ABNORMAL LOW (ref 14–54)
AST: 22 U/L (ref 15–41)
Alkaline Phosphatase: 68 U/L (ref 38–126)
Anion gap: 9 (ref 5–15)
BILIRUBIN TOTAL: 0.7 mg/dL (ref 0.3–1.2)
BUN: 8 mg/dL (ref 6–20)
CHLORIDE: 103 mmol/L (ref 101–111)
CO2: 24 mmol/L (ref 22–32)
Calcium: 8.4 mg/dL — ABNORMAL LOW (ref 8.9–10.3)
Creatinine, Ser: 0.87 mg/dL (ref 0.44–1.00)
GFR calc Af Amer: >60 mL/min (ref >60)
GFR calc non Af Amer: >60 mL/min (ref >60)
GLUCOSE: 177 mg/dL — AB (ref 65–99)
POTASSIUM: 4.9 mmol/L (ref 3.5–5.1)
SODIUM: 136 mmol/L (ref 135–145)
Total Protein: 6.1 g/dL — ABNORMAL LOW (ref 6.5–8.1)

## 2016-11-24 LAB — CBC
HEMATOCRIT: 38 % (ref 36.0–46.0)
Hemoglobin: 11.5 g/dL — ABNORMAL LOW (ref 12.0–15.0)
MCH: 28.2 pg (ref 26.0–34.0)
MCHC: 30.3 g/dL (ref 30.0–36.0)
MCV: 93.1 fL (ref 78.0–100.0)
PLATELETS: 250 10*3/uL (ref 150–400)
RBC: 4.08 MIL/uL (ref 3.87–5.11)
RDW: 14.7 % (ref 11.5–15.5)
WBC: 9.6 10*3/uL (ref 4.0–10.5)

## 2016-11-24 LAB — GLUCOSE, CAPILLARY
GLUCOSE-CAPILLARY: 145 mg/dL — AB (ref 65–99)
Glucose-Capillary: 188 mg/dL — ABNORMAL HIGH (ref 65–99)
Glucose-Capillary: 133 mg/dL — ABNORMAL HIGH (ref 65–99)
Glucose-Capillary: 104 mg/dL — ABNORMAL HIGH (ref 65–99)

## 2016-11-24 MED ORDER — PROMETHAZINE HCL 25 MG/ML IJ SOLN
12.5000 mg | Freq: Four times a day (QID) | INTRAMUSCULAR | Status: DC | PRN
Start: 2016-11-24 — End: 2016-12-08
  Administered 2016-11-24 – 2016-11-26 (×5): 12.5 mg via INTRAVENOUS
  Filled 2016-11-24 (×6): qty 1

## 2016-11-24 MED ORDER — PROMETHAZINE HCL 25 MG/ML IJ SOLN: 12.5000 mg | Freq: Four times a day (QID) | INTRAMUSCULAR | Status: DC | PRN

## 2016-11-24 NOTE — Progress Notes (Signed)
1 Day Post-Op   Subjective/Chief Complaint: Very sore, did walk to BR, no flatus   Objective: Vital signs in last 24 hours: Temp:  [97 F (36.1 C)-98.4 F (36.9 C)] 98.3 F (36.8 C) (08/31 0628) Pulse Rate:  [70-87] 70 (08/31 0628) Resp:  [9-22] 15 (08/31 0829) BP: (97-146)/(50-80) 115/62 (08/31 0628) SpO2:  [91 %-100 %] 95 % (08/31 0829)    Intake/Output from previous day: 08/30 0701 - 08/31 0700 In: 3700 [P.O.:435; I.V.:3265] Out: 730 [Emesis/NG output:200; Drains:480; Blood:50] Intake/Output this shift: No intake/output data recorded.  General appearance: cooperative Resp: clear to auscultation bilaterally Cardio: regular rate and rhythm GI: soft, a few BS, dressing OK, JPs serosang  Lab Results:   Recent Labs  11/24/16 0436  WBC 9.6  HGB 11.5*  HCT 38.0  PLT 250   BMET  Recent Labs  11/24/16 0436  NA 136  K 4.9  CL 103  CO2 24  GLUCOSE 177*  BUN 8  CREATININE 0.87  CALCIUM 8.4*   PT/INR No results for input(s): LABPROT, INR in the last 72 hours. ABG No results for input(s): PHART, HCO3 in the last 72 hours.  Invalid input(s): PCO2, PO2  Studies/Results: No results found.  Anti-infectives: Anti-infectives    Start     Dose/Rate Route Frequency Ordered Stop   11/23/16 1730  ceFAZolin (ANCEF) IVPB 2g/100 mL premix     2 g 200 mL/hr over 30 Minutes Intravenous Every 8 hours 11/23/16 1558 11/23/16 1722   11/23/16 0600  ceFAZolin (ANCEF) 3 g in dextrose 5 % 50 mL IVPB     3 g 130 mL/hr over 30 Minutes Intravenous On call to O.R. 11/22/16 1331 11/23/16 0945      Assessment/Plan: s/p Procedure(s): REMOVAL OF MESH FROM ABDOMINAL WALL (N/A) HERNIA REPAIR INCISIONAL (N/A) POD#1 - await bowel function, PCA for now, Robaxin VTE - Lovenox ERAS IDDM - lantus plus SSI  LOS: 1 day    Eveleigh Crumpler E 11/24/2016

## 2016-11-25 ENCOUNTER — Inpatient Hospital Stay (HOSPITAL_COMMUNITY): Payer: Medicare PPO

## 2016-11-25 ENCOUNTER — Inpatient Hospital Stay (HOSPITAL_COMMUNITY): Payer: Medicare PPO | Admitting: Anesthesiology

## 2016-11-25 ENCOUNTER — Encounter (HOSPITAL_COMMUNITY)
Admission: RE | Disposition: A | Discharge status: Skilled Nursing Facility | Payer: Self-pay | Source: Ambulatory Visit | Attending: Surgery

## 2016-11-25 DIAGNOSIS — K56609 Unspecified intestinal obstruction, unspecified as to partial versus complete obstruction: Secondary | ICD-10-CM

## 2016-11-25 DIAGNOSIS — Z978 Presence of other specified devices: Secondary | ICD-10-CM

## 2016-11-25 DIAGNOSIS — E118 Type 2 diabetes mellitus with unspecified complications: Secondary | ICD-10-CM

## 2016-11-25 HISTORY — PX: LAPAROTOMY: SHX154

## 2016-11-25 LAB — BLOOD GAS, ARTERIAL
ACID-BASE DEFICIT: 0.1 mmol/L (ref 0.0–2.0)
Bicarbonate: 24.9 mmol/L (ref 20.0–28.0)
DRAWN BY: 252031
FIO2: 40
O2 Saturation: 95.8 %
PEEP/CPAP: 5 cmH2O
PH ART: 7.345 — AB (ref 7.350–7.450)
Patient temperature: 98.6
RATE: 12 resp/min
VT: 480 mL
pCO2 arterial: 46.8 mmHg (ref 32.0–48.0)
pO2, Arterial: 88.8 mmHg (ref 83.0–108.0)

## 2016-11-25 LAB — GLUCOSE, CAPILLARY
GLUCOSE-CAPILLARY: 147 mg/dL — AB (ref 65–99)
GLUCOSE-CAPILLARY: 205 mg/dL — AB (ref 65–99)
Glucose-Capillary: 151 mg/dL — ABNORMAL HIGH (ref 65–99)
Glucose-Capillary: 169 mg/dL — ABNORMAL HIGH (ref 65–99)
Glucose-Capillary: 164 mg/dL — ABNORMAL HIGH (ref 65–99)

## 2016-11-25 LAB — BASIC METABOLIC PANEL
Anion gap: 6 (ref 5–15)
BUN: 6 mg/dL (ref 6–20)
CO2: 28 mmol/L (ref 22–32)
CREATININE: 0.83 mg/dL (ref 0.44–1.00)
Calcium: 8.7 mg/dL — ABNORMAL LOW (ref 8.9–10.3)
Chloride: 103 mmol/L (ref 101–111)
Glucose, Bld: 180 mg/dL — ABNORMAL HIGH (ref 65–99)
POTASSIUM: 4.5 mmol/L (ref 3.5–5.1)
SODIUM: 137 mmol/L (ref 135–145)

## 2016-11-25 SURGERY — LAPAROTOMY, EXPLORATORY
Anesthesia: General | Site: Abdomen

## 2016-11-25 MED ORDER — PANTOPRAZOLE SODIUM 40 MG IV SOLR
40.0000 mg | Freq: Two times a day (BID) | INTRAVENOUS | Status: DC
  Administered 2016-11-26 – 2016-12-07 (×22): 40 mg via INTRAVENOUS
  Filled 2016-11-25 (×22): qty 40

## 2016-11-25 MED ORDER — FENTANYL CITRATE (PF) 250 MCG/5ML IJ SOLN
INTRAMUSCULAR | Status: AC
  Filled 2016-11-25: qty 5

## 2016-11-25 MED ORDER — GABAPENTIN 250 MG/5ML PO SOLN
600.0000 mg | Freq: Three times a day (TID) | ORAL | Status: DC
  Administered 2016-11-26 – 2016-11-30 (×11): 600 mg
  Filled 2016-11-25 (×25): qty 12

## 2016-11-25 MED ORDER — LIDOCAINE HCL (CARDIAC) 20 MG/ML IV SOLN
INTRAVENOUS | Status: DC | PRN
  Administered 2016-11-25: 80 mg via INTRAVENOUS

## 2016-11-25 MED ORDER — 0.9 % SODIUM CHLORIDE (POUR BTL) OPTIME
TOPICAL | Status: DC | PRN
  Administered 2016-11-25: 3000 mL

## 2016-11-25 MED ORDER — FENTANYL CITRATE (PF) 100 MCG/2ML IJ SOLN
INTRAMUSCULAR | Status: DC | PRN
  Administered 2016-11-25 (×3): 50 ug via INTRAVENOUS
  Administered 2016-11-25: 100 ug via INTRAVENOUS

## 2016-11-25 MED ORDER — EPHEDRINE SULFATE 50 MG/ML IJ SOLN
INTRAMUSCULAR | Status: DC | PRN
  Administered 2016-11-25 (×2): 10 mg via INTRAVENOUS

## 2016-11-25 MED ORDER — MIDAZOLAM HCL 2 MG/2ML IJ SOLN
INTRAMUSCULAR | Status: AC
  Filled 2016-11-25: qty 2

## 2016-11-25 MED ORDER — ROCURONIUM BROMIDE 100 MG/10ML IV SOLN
INTRAVENOUS | Status: DC | PRN
  Administered 2016-11-25: 100 mg via INTRAVENOUS

## 2016-11-25 MED ORDER — PROPOFOL 500 MG/50ML IV EMUL
INTRAVENOUS | Status: DC | PRN
  Administered 2016-11-25: 75 ug/kg/min via INTRAVENOUS

## 2016-11-25 MED ORDER — CEFAZOLIN SODIUM-DEXTROSE 2-4 GM/100ML-% IV SOLN
2.0000 g | Freq: Once | INTRAVENOUS | Status: AC
  Administered 2016-11-25: 2 g via INTRAVENOUS

## 2016-11-25 MED ORDER — MIDAZOLAM HCL 5 MG/5ML IJ SOLN
INTRAMUSCULAR | Status: DC | PRN
  Administered 2016-11-25: 2 mg via INTRAVENOUS

## 2016-11-25 MED ORDER — FENTANYL CITRATE (PF) 100 MCG/2ML IJ SOLN: 25.0000 ug | INTRAMUSCULAR | Status: DC | PRN

## 2016-11-25 MED ORDER — INSULIN ASPART 100 UNIT/ML ~~LOC~~ SOLN
0.0000 [IU] | SUBCUTANEOUS | Status: DC
  Administered 2016-11-26: 2 [IU] via SUBCUTANEOUS
  Administered 2016-11-26: 5 [IU] via SUBCUTANEOUS
  Administered 2016-11-26 (×2): 2 [IU] via SUBCUTANEOUS
  Administered 2016-11-26: 3 [IU] via SUBCUTANEOUS
  Administered 2016-11-27 – 2016-11-30 (×6): 2 [IU] via SUBCUTANEOUS
  Administered 2016-11-30: 5 [IU] via SUBCUTANEOUS
  Administered 2016-11-30: 2 [IU] via SUBCUTANEOUS
  Administered 2016-12-01 (×4): 5 [IU] via SUBCUTANEOUS
  Administered 2016-12-01 (×2): 3 [IU] via SUBCUTANEOUS
  Administered 2016-12-02: 5 [IU] via SUBCUTANEOUS
  Administered 2016-12-02: 3 [IU] via SUBCUTANEOUS
  Administered 2016-12-02: 2 [IU] via SUBCUTANEOUS
  Administered 2016-12-02 (×3): 3 [IU] via SUBCUTANEOUS
  Administered 2016-12-03: 5 [IU] via SUBCUTANEOUS
  Administered 2016-12-03 (×3): 3 [IU] via SUBCUTANEOUS
  Administered 2016-12-03 (×2): 5 [IU] via SUBCUTANEOUS
  Administered 2016-12-04 – 2016-12-05 (×6): 3 [IU] via SUBCUTANEOUS
  Administered 2016-12-05: 2 [IU] via SUBCUTANEOUS
  Administered 2016-12-05 (×2): 3 [IU] via SUBCUTANEOUS
  Administered 2016-12-05: 5 [IU] via SUBCUTANEOUS
  Administered 2016-12-06 (×4): 3 [IU] via SUBCUTANEOUS
  Administered 2016-12-06: 5 [IU] via SUBCUTANEOUS
  Administered 2016-12-06: 3 [IU] via SUBCUTANEOUS
  Administered 2016-12-07: 11 [IU] via SUBCUTANEOUS
  Administered 2016-12-07: 2 [IU] via SUBCUTANEOUS
  Administered 2016-12-07 (×3): 3 [IU] via SUBCUTANEOUS
  Administered 2016-12-07 – 2016-12-08 (×3): 2 [IU] via SUBCUTANEOUS
  Administered 2016-12-08: 3 [IU] via SUBCUTANEOUS
  Filled 2016-11-25 (×49): qty 0.15

## 2016-11-25 MED ORDER — LACTATED RINGERS IV SOLN
INTRAVENOUS | Status: DC | PRN
  Administered 2016-11-25: 17:00:00 via INTRAVENOUS

## 2016-11-25 MED ORDER — SODIUM CHLORIDE 0.9 % IV SOLN
25.0000 ug/h | INTRAVENOUS | Status: DC
  Administered 2016-11-25: 50 ug/h via INTRAVENOUS
  Filled 2016-11-25: qty 50

## 2016-11-25 MED ORDER — PROPOFOL 1000 MG/100ML IV EMUL
0.0000 ug/kg/min | INTRAVENOUS | Status: DC
  Administered 2016-11-25 (×2): 50 ug/kg/min via INTRAVENOUS
  Administered 2016-11-26: 40 ug/kg/min via INTRAVENOUS
  Administered 2016-11-26: 5 ug/kg/min via INTRAVENOUS
  Administered 2016-11-26: 10 ug/kg/min via INTRAVENOUS
  Administered 2016-11-27 (×2): 15 ug/kg/min via INTRAVENOUS
  Administered 2016-11-27: 16.382 ug/kg/min via INTRAVENOUS
  Administered 2016-11-28 (×2): 25 ug/kg/min via INTRAVENOUS
  Administered 2016-11-28: 15 ug/kg/min via INTRAVENOUS
  Administered 2016-11-28 – 2016-11-29 (×4): 25 ug/kg/min via INTRAVENOUS
  Administered 2016-11-29: 20 ug/kg/min via INTRAVENOUS
  Administered 2016-11-30: 30 ug/kg/min via INTRAVENOUS
  Administered 2016-11-30: 15 ug/kg/min via INTRAVENOUS
  Filled 2016-11-25 (×19): qty 100

## 2016-11-25 MED ORDER — HEPARIN SODIUM (PORCINE) 5000 UNIT/ML IJ SOLN
5000.0000 [IU] | Freq: Three times a day (TID) | INTRAMUSCULAR | Status: DC
  Administered 2016-11-26 – 2016-12-07 (×31): 5000 [IU] via SUBCUTANEOUS
  Filled 2016-11-25 (×31): qty 1

## 2016-11-25 MED ORDER — IOPAMIDOL (ISOVUE-300) INJECTION 61%
INTRAVENOUS | Status: AC
  Filled 2016-11-25: qty 100

## 2016-11-25 MED ORDER — FENTANYL BOLUS VIA INFUSION
50.0000 ug | INTRAVENOUS | Status: DC | PRN
  Administered 2016-11-26 – 2016-11-28 (×7): 50 ug via INTRAVENOUS
  Administered 2016-11-28: 25 ug via INTRAVENOUS
  Filled 2016-11-25: qty 50

## 2016-11-25 MED ORDER — IOPAMIDOL (ISOVUE-300) INJECTION 61%
INTRAVENOUS | Status: AC
  Administered 2016-11-25: 100 mL via INTRAVENOUS
  Filled 2016-11-25: qty 30

## 2016-11-25 MED ORDER — CITALOPRAM HYDROBROMIDE 20 MG PO TABS
20.0000 mg | ORAL_TABLET | Freq: Three times a day (TID) | ORAL | Status: DC
  Administered 2016-11-26 – 2016-12-08 (×26): 20 mg
  Filled 2016-11-25: qty 2
  Filled 2016-11-25 (×4): qty 1
  Filled 2016-11-25 (×9): qty 2
  Filled 2016-11-25: qty 1
  Filled 2016-11-25 (×3): qty 2
  Filled 2016-11-25 (×2): qty 1
  Filled 2016-11-25 (×2): qty 2
  Filled 2016-11-25: qty 1
  Filled 2016-11-25 (×7): qty 2

## 2016-11-25 MED ORDER — SUCCINYLCHOLINE CHLORIDE 20 MG/ML IJ SOLN
INTRAMUSCULAR | Status: DC | PRN
Start: 2016-11-25 — End: 2016-11-25
  Administered 2016-11-25: 140 mg via INTRAVENOUS

## 2016-11-25 MED ORDER — PHENYLEPHRINE HCL 10 MG/ML IJ SOLN
INTRAMUSCULAR | Status: DC | PRN
  Administered 2016-11-25: 30 ug/min via INTRAVENOUS

## 2016-11-25 MED ORDER — ALBUMIN HUMAN 5 % IV SOLN
INTRAVENOUS | Status: DC | PRN
  Administered 2016-11-25: 18:00:00 via INTRAVENOUS

## 2016-11-25 MED ORDER — PROPOFOL 10 MG/ML IV BOLUS
INTRAVENOUS | Status: DC | PRN
  Administered 2016-11-25: 200 mg via INTRAVENOUS

## 2016-11-25 MED ORDER — PROMETHAZINE HCL 25 MG/ML IJ SOLN: 6.2500 mg | INTRAMUSCULAR | Status: DC | PRN

## 2016-11-25 MED ORDER — INSULIN ASPART 100 UNIT/ML ~~LOC~~ SOLN: 0.0000 [IU] | SUBCUTANEOUS | Status: DC

## 2016-11-25 MED ORDER — ALBUMIN HUMAN 5 % IV SOLN
25.0000 g | Freq: Once | INTRAVENOUS | Status: DC
  Filled 2016-11-25: qty 500
  Filled 2016-11-25: qty 250

## 2016-11-25 MED ORDER — POTASSIUM CHLORIDE 2 MEQ/ML IV SOLN: INTRAVENOUS | Status: DC

## 2016-11-25 MED ORDER — PROPOFOL 10 MG/ML IV BOLUS
INTRAVENOUS | Status: AC
  Filled 2016-11-25: qty 20

## 2016-11-25 MED ORDER — FENTANYL CITRATE (PF) 100 MCG/2ML IJ SOLN: 50.0000 ug | Freq: Once | INTRAMUSCULAR | Status: DC

## 2016-11-25 MED ORDER — LACTATED RINGERS IV SOLN
INTRAVENOUS | Status: DC
  Administered 2016-11-26 – 2016-11-29 (×7): via INTRAVENOUS

## 2016-11-25 SURGICAL SUPPLY — 41 items
ABTHERA   ADVANCE ×3 IMPLANT
BENZOIN TINCTURE PRP APPL 2/3 (GAUZE/BANDAGES/DRESSINGS) ×6 IMPLANT
BLADE CLIPPER SURG (BLADE) IMPLANT
CANISTER SUCT 3000ML PPV (MISCELLANEOUS) IMPLANT
CANISTER WOUND CARE 500ML ATS (WOUND CARE) ×3 IMPLANT
CHLORAPREP W/TINT 26ML (MISCELLANEOUS) IMPLANT
COVER SURGICAL LIGHT HANDLE (MISCELLANEOUS) ×3 IMPLANT
DRAPE LAPAROSCOPIC ABDOMINAL (DRAPES) ×3 IMPLANT
DRAPE WARM FLUID 44X44 (DRAPE) ×3 IMPLANT
DRSG OPSITE POSTOP 4X10 (GAUZE/BANDAGES/DRESSINGS) IMPLANT
DRSG OPSITE POSTOP 4X8 (GAUZE/BANDAGES/DRESSINGS) IMPLANT
DRSG PAD ABDOMINAL 8X10 ST (GAUZE/BANDAGES/DRESSINGS) ×3 IMPLANT
ELECT BLADE 6.5 EXT (BLADE) IMPLANT
ELECT CAUTERY BLADE 6.4 (BLADE) ×3 IMPLANT
ELECT REM PT RETURN 9FT ADLT (ELECTROSURGICAL) ×3
ELECTRODE REM PT RTRN 9FT ADLT (ELECTROSURGICAL) ×1 IMPLANT
GLOVE BIO SURGEON STRL SZ8 (GLOVE) ×3 IMPLANT
GLOVE BIOGEL PI IND STRL 8 (GLOVE) ×1 IMPLANT
GLOVE BIOGEL PI INDICATOR 8 (GLOVE) ×2
GOWN STRL REUS W/ TWL LRG LVL3 (GOWN DISPOSABLE) ×1 IMPLANT
GOWN STRL REUS W/ TWL XL LVL3 (GOWN DISPOSABLE) ×1 IMPLANT
GOWN STRL REUS W/TWL LRG LVL3 (GOWN DISPOSABLE) ×2
GOWN STRL REUS W/TWL XL LVL3 (GOWN DISPOSABLE) ×2
KIT BASIN OR (CUSTOM PROCEDURE TRAY) ×3 IMPLANT
KIT ROOM TURNOVER OR (KITS) ×3 IMPLANT
LIGASURE IMPACT 36 18CM CVD LR (INSTRUMENTS) IMPLANT
NS IRRIG 1000ML POUR BTL (IV SOLUTION) ×6 IMPLANT
PACK GENERAL/GYN (CUSTOM PROCEDURE TRAY) ×3 IMPLANT
PAD ARMBOARD 7.5X6 YLW CONV (MISCELLANEOUS) ×6 IMPLANT
SPECIMEN JAR LARGE (MISCELLANEOUS) IMPLANT
SPONGE LAP 18X18 X RAY DECT (DISPOSABLE) ×3 IMPLANT
STAPLER VISISTAT 35W (STAPLE) ×3 IMPLANT
SUCTION POOLE TIP (SUCTIONS) ×3 IMPLANT
SUT PDS AB 1 TP1 96 (SUTURE) ×6 IMPLANT
SUT SILK 2 0 SH CR/8 (SUTURE) ×3 IMPLANT
SUT SILK 2 0 TIES 10X30 (SUTURE) ×3 IMPLANT
SUT SILK 3 0 SH CR/8 (SUTURE) ×3 IMPLANT
SUT SILK 3 0 TIES 10X30 (SUTURE) ×3 IMPLANT
TOWEL OR 17X26 10 PK STRL BLUE (TOWEL DISPOSABLE) ×3 IMPLANT
TRAY FOLEY W/METER SILVER 16FR (SET/KITS/TRAYS/PACK) IMPLANT
YANKAUER SUCT BULB TIP NO VENT (SUCTIONS) ×3 IMPLANT

## 2016-11-25 NOTE — Progress Notes (Signed)
[facility] Progress Note  2 Days Post-Op  Subjective: CC: "I feel miserable"  Increasing left sided swelling Increased drain output  Objective: Vital signs in last 24 hours: Temp:  [98.3 F (36.8 C)-99.5 F (37.5 C)] 98.4 F (36.9 C) (09/01 0530) Pulse Rate:  [71-81] 71 (09/01 0530) Resp:  [10-19] 10 (09/01 0756) BP: (98-132)/(59-74) 127/62 (09/01 0530) SpO2:  [95 %-100 %] 95 % (09/01 0756) Last BM Date: 11/22/16  Intake/Output from previous day: 08/31 0701 - 09/01 0700 In: 2646.3 [P.O.:680; I.V.:1966.3] Out: 3480 [Urine:2325; Drains:1155] Intake/Output this shift: No intake/output data recorded.  PE: Gen:  Alert, NAD, pleasant Card:  Regular rate and rhythm, pedal pulses 2+ BL Pulm:  Normal effort, clear to auscultation bilaterally Abd: Obese, hypoactive bowel sounds; left sided distention; drain with copious serosanguinous output Incision - dry; intact; no drainage Skin: warm and dry, no rashes  Psych: A&Ox3   Lab Results:   Recent Labs  11/24/16 0436  WBC 9.6  HGB 11.5*  HCT 38.0  PLT 250   BMET  Recent Labs  11/24/16 0436 11/25/16 0436  NA 136 137  K 4.9 4.5  CL 103 103  CO2 24 28  GLUCOSE 177* 180*  BUN 8 6  CREATININE 0.87 0.83  CALCIUM 8.4* 8.7*   PT/INR No results for input(s): LABPROT, INR in the last 72 hours. CMP     Component Value Date/Time   NA 137 11/25/2016 0436   K 4.5 11/25/2016 0436   CL 103 11/25/2016 0436   CO2 28 11/25/2016 0436   GLUCOSE 180 (H) 11/25/2016 0436   BUN 6 11/25/2016 0436   CREATININE 0.83 11/25/2016 0436   CALCIUM 8.7 (L) 11/25/2016 0436   PROT 6.1 (L) 11/24/2016 0436   ALBUMIN 2.9 (L) 11/24/2016 0436   AST 22 11/24/2016 0436   ALT 11 (L) 11/24/2016 0436   ALKPHOS 68 11/24/2016 0436   BILITOT 0.7 11/24/2016 0436   GFRNONAA >60 11/25/2016 0436   GFRAA >60 11/25/2016 0436    Anti-infectives: Anti-infectives    Start     Dose/Rate Route Frequency Ordered Stop   11/23/16 1730   ceFAZolin (ANCEF) IVPB 2g/100 mL premix     2 g 200 mL/hr over 30 Minutes Intravenous Every 8 hours 11/23/16 1558 11/23/16 1722   11/23/16 0600  ceFAZolin (ANCEF) 3 g in dextrose 5 % 50 mL IVPB     3 g 130 mL/hr over 30 Minutes Intravenous On call to O.R. 11/22/16 1331 11/23/16 0945       Assessment/Plan Abdominal wall seroma S/p removal of mesh from abdominal wall and incisional hernia repair 8/30 Dr. Brantley Stage - POD#2 - 1,155 cc output from drain in 24 hours - concerning for fascial dehiscence - will obtain CT scan today  DM - lantus +SSI  FEN:  Await bowel function - NPO until after ct scan VTE:  Lovenox ID:No abx; nl WBC  Plan: CT abd/ pelvis today NPO for now  LOS: 2 days    Brigid Re , [facility] 11/25/2016, 10:05 AM Pager: [phone] Consults: [place]. Georgette Dover, MD, [facility]  General/ Trauma Surgery  11/25/2016 10:21 AM  Mon-Fri 7:00 am-4:30 pm Sat-Sun 7:00 am-11:30 am

## 2016-11-25 NOTE — Anesthesia Procedure Notes (Signed)
Procedure Name: Intubation Date/Time: 11/25/2016 4:57 PM Performed by: Lance Coon Pre-anesthesia Checklist: Patient identified, Emergency Drugs available, Suction available, Patient being monitored and Timeout performed Patient Re-evaluated:Patient Re-evaluated prior to induction Oxygen Delivery Method: Circle system utilized Preoxygenation: Pre-oxygenation with 100% oxygen Induction Type: IV induction, Rapid sequence and Cricoid Pressure applied Laryngoscope Size: Miller and 3 Grade View: Grade I Tube type: Oral Tube size: 7.0 mm Number of attempts: 1 Airway Equipment and Method: Stylet Placement Confirmation: ETT inserted through vocal cords under direct vision,  positive ETCO2 and breath sounds checked- equal and bilateral Secured at: 21 cm Tube secured with: Tape Dental Injury: Teeth and Oropharynx as per pre-operative assessment

## 2016-11-25 NOTE — Op Note (Signed)
11/23/2016 - 11/25/2016  5:52 PM  PATIENT:  Krystal Campbell  56 y.o. 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 Bowel was viable. No perforations. There was some contusion to the small bowel mesentery but it was well-perfused.  Procedure in detail: Monai underwent removal of previous hernia mesh and primary repair of incisional hernia including anterior rectus released bilaterally on 8/30 by Dr. Christie Beckers. Today she developed a large swelling on her left abdominal wall. When she was examined by my partner, he obtained a CT scan of the abdomen and pelvis. This revealed she developeda hernia to the left of the midline containing a large amount of small bowel causing small bowel obstruction. She is brought for emergent exploratory laparotomy. Informed consent was obtained from her family. She received intravenous antibiotics. She was brought to the operating room and general endotracheal anesthesia was a Company secretary by the anesthesia staff.Her JP drain was removed. Her abdomen was prepped and draped in sterile fashion. We did time out procedure. The staples were removed from her old skin incision and discarded. The skin opened automatically and there was bowel directly beneath it. The skin incision was extended superiorly and  inferiorly. A large amount of small bowel had herniated out where the left rectus release was performed. Her actual hernia closure along the midline was intact. The fascia laterally was opened superiorly and inferiorly a little bit more in order to reduce the bowel. The bowel was closely inspected. It was viable with no perforations. It was very edematous. There was some contusion to the mesentery but there was excellent perfusion. The bowel was gently reduced back into the abdomen. The area was irrigated and hemostasis was ensured.There was far too much bowel distention and edema to attempt any closure at this time. I then placed an open abdomen VAC device, the Abthera Advance, in standard fashion. The inner drape was tucked around the bowel completely in a gentle fashion. 2 blue sponges were fashioned to size on top and VAC drapes were placed to cover completely. It was hooked up to the [facility] device and excellent seal was obtained. All counts were correct. She tolerated the procedure without any apparent complication and will be taken directly to the intensive care unit on the ventilator with plans for further surgery in the coming days. PATIENT DISPOSITION:  ICU - intubated and critically ill.   Delay start of Pharmacological VTE agent (>24hrs) due to surgical blood loss or risk of bleeding:  no  Georganna Skeans, MD, MPH, FACS Pager: [phone]  9/1/20185:52 PM

## 2016-11-25 NOTE — Anesthesia Procedure Notes (Signed)
Central Venous Catheter Insertion Performed by: Audry Pili, anesthesiologist Start/End9/03/2016 6:10 PM, 11/25/2016 6:21 PM Patient location: OR. Preanesthetic checklist: patient identified, IV checked, site marked, risks and benefits discussed, surgical consent, monitors and equipment checked, pre-op evaluation, timeout performed and anesthesia consent Position: Trendelenburg Hand hygiene performed , maximum sterile barriers used  and Seldinger technique used Catheter size: 7 Fr Total catheter length 10. Central line was placed.Triple lumen Procedure performed using ultrasound guided technique. Ultrasound Notes:anatomy identified, needle tip was noted to be adjacent to the nerve/plexus identified and no ultrasound evidence of intravascular and/or intraneural injection Attempts: 1 Following insertion, dressing applied. Patient tolerated the procedure well with no immediate complications. Additional procedure comments: Morbidly obese patient with short neck. Accessed Right IJ on first stick with needle, catheter advanced smoothly. Wire threaded, but unable to advance past approximately 15cm. Confirmed wire in IJ via [place]. Vessel dilated. Unfortunately, was not able to pass flimsy triple lumen catheter fully into the vessel. Catheter continued bending despite multiple techniques, including traction on chest and head to straight neck. Placement abandoned. Wire and line removed. Pressure held at site for approx 7 minutes. No bleeding noted after release of pressure. 4x4 and tegaderm placed over access site.Marland Kitchen

## 2016-11-25 NOTE — Consult Note (Signed)
PULMONARY / CRITICAL CARE MEDICINE   Name: Krystal Campbell MRN: [id number] DOB: 03/30/1960    ADMISSION DATE:  11/23/2016 CONSULTATION DATE:  11/25/16  REFERRING MD:  Dr Brantley Stage  CHIEF COMPLAINT:  Consult for assistance with mechanical ventilation post-op  HISTORY OF PRESENT ILLNESS:   At the time my examination the patient is intubated and sedated and thusly unable to provide history of present illness, review of systems, past medical history, family history, social history, home medications, or allergies. Histories are obtained from family, medical providers, with a medical record.  56 year old woman admitted after surgical repair for chronic seroma with removal of mesh and repair of incisional hernia. On postop day 1 this was complicated by recurrent incisional hernia with small bowel obstruction. She was taken to the operating room and had an exploratory laparotomy and placement of open it abdominal VAC. Postoperatively she was left intubated and is being transferred to the intensive care unit. I see was consult by the attending surgeon for assistance with management of mechanical ventilation and ICU care.  PAST MEDICAL HISTORY :  She  has a past medical history of Anemia; Anxiety; Arthritis; Cancer ([place]); Carpal tunnel syndrome of right wrist; Chronic kidney disease; Chronic pain syndrome; Depression; Diabetes mellitus without complication ([place]); Diverticulosis; GERD (gastroesophageal reflux disease); History of blood transfusion; History of hiatal hernia; Hyperlipidemia; Incisional hernia; Pneumonia; Pulmonary embolism ([place] and Wears glasses.  PAST SURGICAL HISTORY: She  has a past surgical history that includes Replacement total knee; Abdominal hysterectomy; Kidney surgery; Joint replacement (4/15); Colonoscopy with propofol (Left, 10/30/2014); Esophagogastroduodenoscopy (egd) with propofol (Left, 10/30/2014); Laparoscopic partial colectomy (N/A, 11/04/2014); IVC FILTER PLACEMENT ([facility] HX);  Appendectomy; Dilation and curettage of uterus; Portacath placement (Right, 12/22/2014); Incisional hernia repair (N/A, 01/17/2016); Insertion of mesh (N/A, 01/17/2016); Port-a-cath removal (Right, 03/07/2016); ir generic historical (06/19/2016); Incision and drainage abscess (N/A, 07/06/2016); IVC filter removed; Left knee arthroscopy; Incision and drainage abscess (N/A, 08/09/2016); IR Radiologist Eval & Mgmt (06/13/2016); IR Radiologist Eval & Mgmt (06/27/2016); Tubal ligation; Excision of mesh (N/A, 11/23/2016); and Incisional hernia repair (N/A, 11/23/2016).  No Known Allergies  No current facility-administered medications on file prior to encounter.    Current Outpatient Prescriptions on File Prior to Encounter  Medication Sig  . aspirin EC 81 MG tablet Take 81 mg by mouth daily.  . Melatonin 10 MG CAPS Take 20 mg by mouth at bedtime.   . pantoprazole (PROTONIX) 40 MG tablet Take 1 tablet (40 mg total) by mouth 2 (two) times daily.  Marland Kitchen ibuprofen (ADVIL,MOTRIN) 800 MG tablet Take 1 tablet (800 mg total) by mouth every 8 (eight) hours as needed. (Patient not taking: Reported on 11/16/2016)  . oxyCODONE (OXY IR/ROXICODONE) 5 MG immediate release tablet Take 1-2 tablets (5-10 mg total) by mouth every 6 (six) hours as needed for severe pain. (Patient not taking: Reported on 11/16/2016)  . vitamin B-12 1000 MCG tablet Take 1 tablet (1,000 mcg total) by mouth daily. (Patient not taking: Reported on 11/16/2016)    FAMILY HISTORY:  Her indicated that her mother is alive. She indicated that her father is alive. She indicated that all of her three sisters are alive. She indicated that the status of her other is unknown.    SOCIAL HISTORY: She  reports that she quit smoking about 48 years ago. Her smoking use included Cigarettes. She has a 5.00 pack-year smoking history. She has never used smokeless tobacco. She reports that she drinks alcohol. She reports that she uses drugs, including Marijuana.  VITAL  SIGNS: BP 120/69   Pulse 84   Temp 97.9 F (36.6 C)   Resp 13   Ht 5\' 4"  (1.626 m)   Wt 258 lb (117 kg)   SpO2 99%   BMI 44.29 kg/m   HEMODYNAMICS:    VENTILATOR SETTINGS: Vent Mode: PRVC FiO2 (%):  [40 %-60 %] 40 % Set Rate:  [12 bmp] 12 bmp Vt Set:  [480 mL] 480 mL PEEP:  [5 cmH20] 5 cmH20 Plateau Pressure:  [14 cmH20-18 cmH20] 14 cmH20  INTAKE / OUTPUT: I/O last 3 completed shifts: In: 3896.3 [P.O.:680; I.V.:2966.3; IV Piggyback:250] Out: 6948 [NIOEV:0350; Emesis/NG output:250; Drains:1525; Blood:50]  PHYSICAL EXAMINATION: General:  Obese, older than stated age, no acute distress Neuro:  Withdraws to pain HEENT:  PERRLA, MOMM, ATNC Cardiovascular:  Regular rate and rhythm, no murmur, no edema, capillary refill Lungs:  Clear to auscultation no wheezing no rales no rhonchi not spontaneously breathing on the ventilator Abdomen:  Protuberant absent bowel sounds, midline wound VAC, soft nontender Musculoskeletal:  No red warm swollen tender deformed joints Skin:  Open abdominal surgical wound, warm, dry, no rash GU: Foley catheter clear yellow urine, 45 mL prior hour  LABS:  BMET  Recent Labs Lab 11/20/16 0916 11/24/16 0436 11/25/16 0436  NA 137 136 137  K 4.5 4.9 4.5  CL 104 103 103  CO2 23 24 28   BUN 12 8 6   CREATININE 0.98 0.87 0.83  GLUCOSE 154* 177* 180*    Electrolytes  Recent Labs Lab 11/20/16 0916 11/24/16 0436 11/25/16 0436  CALCIUM 9.5 8.4* 8.7*    CBC  Recent Labs Lab 11/20/16 0916 11/24/16 0436  WBC 6.3 9.6  HGB 12.3 11.5*  HCT 39.0 38.0  PLT 289 250    Coag's No results for input(s): APTT, INR in the last 168 hours.  Sepsis Markers No results for input(s): LATICACIDVEN, PROCALCITON, O2SATVEN in the last 168 hours.  ABG  Recent Labs Lab 11/25/16 2040  PHART 7.345*  PCO2ART 46.8  PO2ART 88.8    Liver Enzymes  Recent Labs Lab 11/20/16 0916 11/24/16 0436  AST 20 22  ALT 14 11*  ALKPHOS 83 68  BILITOT 0.4  0.7  ALBUMIN 3.4* 2.9*    Cardiac Enzymes No results for input(s): TROPONINI, PROBNP in the last 168 hours.  Glucose  Recent Labs Lab 11/24/16 2151 11/25/16 0735 11/25/16 1216 11/25/16 1556 11/25/16 1844 11/25/16 2145  GLUCAP 145* 151* 147* 169* 164* 205*    Imaging Ct Abdomen Pelvis W Contrast  Result Date: 11/25/2016 CLINICAL DATA:  56 year old female with left abdominal pain and swelling following abdominal wall seroma excision, hernia mesh removal and repair 2 days ago. EXAM: CT ABDOMEN AND PELVIS WITH CONTRAST TECHNIQUE: Multidetector CT imaging of the abdomen and pelvis was performed using the standard protocol following bolus administration of intravenous contrast. CONTRAST:  152mL ISOVUE-300 IOPAMIDOL (ISOVUE-300) INJECTION 61% COMPARISON:  10/17/2016 and prior CTs FINDINGS: Lower chest: Mild bibasilar atelectasis noted. Hepatobiliary: The liver and gallbladder are unremarkable. No biliary dilatation. Pancreas: Unremarkable Spleen: Unremarkable except for calcified granulomas Adrenals/Urinary Tract: Atrophic right kidney again noted. The left kidney, adrenal glands and bladder are unremarkable. Stomach/Bowel: A large left paramedian abdominal wall hernia is noted containing multiple dilated small bowel loops. Distension of small bowel loops proximal to the hernia noted with collapsed small bowel loops distal to the hernia. There is no evidence of pneumoperitoneum or focal abscess. A percutaneous drain is present within the hernia. Vascular/Lymphatic: No significant vascular findings  are present. The visualized mesenteric vessels appear unremarkable. No enlarged abdominal or pelvic lymph nodes. Reproductive: Bilateral tubal ligation clips again noted. No other significant abnormality Other: No ascites. Musculoskeletal: No acute abnormality. IMPRESSION: 1. Large left paramedian abdominal wall hernia containing multiple dilated loops of small bowel, compatible with small bowel obstruction.  No evidence of pneumoperitoneum or abscess. 2. Mild bibasilar atelectasis 3. These results will be called to the ordering clinician or representative by the Radiologist Assistant, and communication documented in the PACS or zVision Dashboard. Electronically Signed   By: Margarette Canada M.D.   On: 11/25/2016 14:33   Dg Chest Port 1 View  Result Date: 11/25/2016 CLINICAL DATA:  OG tube placement and attempt at central venous catheter placement. EXAM: PORTABLE CHEST 1 VIEW COMPARISON:  None. FINDINGS: An endotracheal tube is identified with tip at the carina. An OG tube is present with tip overlying the proximal- mid stomach. This is a low volume film with bibasilar atelectasis. No evidence of pleural effusion or pneumothorax. IMPRESSION: Endotracheal tube with tip at the carina -recommend 2 cm retraction. Diane, nurse for this patient was notified on 11/25/2016 at 7:35 pm. OG tube with tip overlying the proximal - mid stomach. Low volume film with bibasilar atelectasis. Electronically Signed   By: Margarette Canada M.D.   On: 11/25/2016 19:35     STUDIES:    CULTURES: None  ANTIBIOTICS: None  SIGNIFICANT EVENTS: 8/30: Removal of mesh 9/1: Exploratory laparotomy wound VAC  LINES/TUBES: 9/1: Intubated  DISCUSSION: 56 year old woman admitted to the ICU postoperatively after urgent exploratory laparotomy for small bowel obstruction due to incisional hernia one day after having removal of abdominal mesh. On examination her vital signs are appropriate she appears well perfused with adequate urine output and soft abdomen.  ASSESSMENT / PLAN:  PULMONARY A: Intubated for airway protection P:   Low tidal volume ventilation, daily spontaneous breathing daily awakening  CARDIOVASCULAR A:  No active issues P:  Telemetry Hold home aspirin  RENAL A:   No active issues P:   Monitor BMP  GASTROINTESTINAL A:   Small bowel obstruction status post exploratory lap GI prophylaxis P:   Management of  open abdomen per surgery Protonix IV Nothing by mouth NGT LIS  HEMATOLOGIC A:   Normocytic anemia DVT prophylaxis P:  SCDs [facility] Monitor CBC  INFECTIOUS A:   No active issue, Op-note reports no perforations and well-perfused small bowel P:   monitro for infection VAP PPx  ENDOCRINE A:   DM   P:   SSI  NEUROLOGIC A:   Pain and sedation P:   RASS goal: -1 Fentanyl propofol gabapentin   FAMILY  - Updates:  - Inter-disciplinary family meet or Palliative Care meeting due by:  12/02/16  Upon my evaluation, this patient had a high probability of imminent or life-threatening deterioration due to inability to protect airway  high complexity decision making to assess, manipulate, and support vital organ system failure including mechanical ventilation  I have personally provided 35 minutes of critical care time exclusive of time spent on separately billable procedures and education. Time includes review and summation of previous medical record, laboratory data, radiology results, coordination of care with RN, and monitoring for potential decompensation. Interventions were performed as documented above.  Condition: critical Prognosis: guarded Code Status: full  Charlesetta Garibaldi, MD [place] Pager: [phone]   11/25/2016, 9:58 PM

## 2016-11-25 NOTE — Anesthesia Preprocedure Evaluation (Addendum)
Anesthesia Evaluation  Patient identified by MRN, date of birth, ID band Patient awake    Reviewed: Allergy & Precautions, NPO status , Patient's Chart, lab work & pertinent test results  Airway Mallampati: III  TM Distance: <3 FB Neck ROM: Full    Dental  (+) Dental Advisory Given   Pulmonary former smoker, PE   Pulmonary exam normal breath sounds clear to auscultation       Cardiovascular hypertension, + DVT  Normal cardiovascular exam Rhythm:Regular Rate:Normal     Neuro/Psych PSYCHIATRIC DISORDERS Anxiety Depression negative neurological ROS     GI/Hepatic Neg liver ROS, hiatal hernia, GERD  ,  Endo/Other  diabetes, Type 2, Oral Hypoglycemic AgentsMorbid obesity  Renal/GU Renal disease  negative genitourinary   Musculoskeletal  (+) Arthritis ,   Abdominal   Peds negative pediatric ROS (+)  Hematology negative hematology ROS (+)   Anesthesia Other Findings Chronic pain syndrome, hx colon ca  Reproductive/Obstetrics negative OB ROS                            Anesthesia Physical  Anesthesia Plan  ASA: III and emergent  Anesthesia Plan: General   Post-op Pain Management:    Induction: Intravenous, Cricoid pressure planned and Rapid sequence  PONV Risk Score and Plan: 4 or greater and Ondansetron, Dexamethasone, Midazolam, Scopolamine patch - Pre-op and Propofol infusion  Airway Management Planned: Oral ETT  Additional Equipment: CVP and Ultrasound Guidance Line Placement  Intra-op Plan:   Post-operative Plan: Post-operative intubation/ventilation  Informed Consent: I have reviewed the patients History and Physical, chart, labs and discussed the procedure including the risks, benefits and alternatives for the proposed anesthesia with the patient or authorized representative who has indicated his/her understanding and acceptance.   Dental advisory given  Plan Discussed with:  CRNA  Anesthesia Plan Comments: ([place] After discussion with surgeon, abdomen will likely be left open. Will plan for postoperative ventilation and will place CVL at end of case given poor and difficult IV access.)   Anesthesia Quick Evaluation

## 2016-11-25 NOTE — Anesthesia Postprocedure Evaluation (Signed)
Anesthesia Post Note  Patient: CLYDIA NIEVES  Procedure(s) Performed: Procedure(s) (LRB): EXPLORATORY LAPAROTOMY AND PLACEMENT OF OPEN ABDOMEN VAC (N/A)     Patient location during evaluation: SICU Anesthesia Type: General Level of consciousness: sedated Pain management: pain level controlled Vital Signs Assessment: post-procedure vital signs reviewed and stable Respiratory status: patient remains intubated per anesthesia plan Cardiovascular status: stable Anesthetic complications: no    Last Vitals:  Vitals:   11/25/16 2230 11/25/16 2245  BP: 124/64 129/64  Pulse:    Resp:    Temp:    SpO2:      Last Pain:  Vitals:   11/25/16 1544  TempSrc: Oral  PainSc:                  Audry Pili

## 2016-11-25 NOTE — Progress Notes (Signed)
Large amount of serosang.drainage from JP drain through the night and 150 cc emptied this am.  Left side of abd tight, more than right.  Abd binder on.

## 2016-11-25 NOTE — Transfer of Care (Signed)
Immediate Anesthesia Transfer of Care Note  Patient: Krystal Campbell  Procedure(s) Performed: Procedure(s): EXPLORATORY LAPAROTOMY AND PLACEMENT OF OPEN ABDOMEN VAC (N/A)  Patient Location: PACU  Anesthesia Type:General  Level of Consciousness: sedated and patient cooperative  Airway & Oxygen Therapy: Patient remains intubated per anesthesia plan and Patient placed on Ventilator (see vital sign flow sheet for setting)  Post-op Assessment: Report given to RN and Post -op Vital signs reviewed and stable  Post vital signs: Reviewed and stable  Last Vitals:  Vitals:   11/25/16 1443 11/25/16 1544  BP: (!) 145/78 (!) 184/89  Pulse: 76 91  Resp: 17 (!) 24  Temp: 36.7 C 37.1 C  SpO2: 97% 98%    Last Pain:  Vitals:   11/25/16 1544  TempSrc: Oral  PainSc:       Patients Stated Pain Goal: 2 (83/25/49 8264)  Complications: No apparent anesthesia complications

## 2016-11-25 NOTE — Progress Notes (Signed)
Patient ID: Krystal Campbell, female   DOB: February 18, 1961, 56 y.o.   MRN: [id number] CT reviewed. Patient has recurrent incisional hernia with SBO. Will proceed with emergent exploratory laparotomy. I spoke with her and her family. I may have to leave her abdomen open with plan for further surgery depending on the condition of her bowel. I discussed this at length with them.  Georganna Skeans, MD, MPH, FACS Trauma: [phone] General Surgery: [phone]

## 2016-11-25 NOTE — Progress Notes (Signed)
E.T tube pulled back 2cm from 22cm to 20cm per M.D.

## 2016-11-26 ENCOUNTER — Encounter (HOSPITAL_COMMUNITY)
Admission: RE | Disposition: A | Discharge status: Skilled Nursing Facility | Payer: Self-pay | Source: Ambulatory Visit | Attending: Surgery

## 2016-11-26 ENCOUNTER — Inpatient Hospital Stay (HOSPITAL_COMMUNITY): Payer: Medicare PPO

## 2016-11-26 ENCOUNTER — Inpatient Hospital Stay (HOSPITAL_COMMUNITY): Payer: Medicare PPO | Admitting: Anesthesiology

## 2016-11-26 ENCOUNTER — Encounter (HOSPITAL_COMMUNITY): Payer: Self-pay | Admitting: General Surgery

## 2016-11-26 DIAGNOSIS — J9600 Acute respiratory failure, unspecified whether with hypoxia or hypercapnia: Secondary | ICD-10-CM

## 2016-11-26 HISTORY — PX: LAPAROTOMY: SHX154

## 2016-11-26 LAB — BLOOD GAS, ARTERIAL
ACID-BASE EXCESS: 1.6 mmol/L (ref 0.0–2.0)
BICARBONATE: 26.2 mmol/L (ref 20.0–28.0)
Drawn by: 51133
FIO2: 100
LHR: 12 {breaths}/min
MECHVT: 480 mL
O2 SAT: 97.7 %
PEEP/CPAP: 5 cmH2O
PH ART: 7.39 (ref 7.350–7.450)
PO2 ART: 292 mmHg — AB (ref 83.0–108.0)
Patient temperature: 98
pCO2 arterial: 44 mmHg (ref 32.0–48.0)

## 2016-11-26 LAB — CBC
HEMATOCRIT: 33.6 % — AB (ref 36.0–46.0)
HEMOGLOBIN: 10.5 g/dL — AB (ref 12.0–15.0)
MCH: 28.6 pg (ref 26.0–34.0)
MCHC: 31.3 g/dL (ref 30.0–36.0)
MCV: 91.6 fL (ref 78.0–100.0)
Platelets: 275 10*3/uL (ref 150–400)
RBC: 3.67 MIL/uL — AB (ref 3.87–5.11)
RDW: 14.8 % (ref 11.5–15.5)
WBC: 10.2 10*3/uL (ref 4.0–10.5)

## 2016-11-26 LAB — BASIC METABOLIC PANEL
ANION GAP: 4 — AB (ref 5–15)
BUN: 7 mg/dL (ref 6–20)
CALCIUM: 8.1 mg/dL — AB (ref 8.9–10.3)
CO2: 30 mmol/L (ref 22–32)
Chloride: 102 mmol/L (ref 101–111)
Creatinine, Ser: 0.91 mg/dL (ref 0.44–1.00)
GFR calc non Af Amer: >60 mL/min (ref >60)
GLUCOSE: 195 mg/dL — AB (ref 65–99)
POTASSIUM: 4.4 mmol/L (ref 3.5–5.1)
Sodium: 136 mmol/L (ref 135–145)

## 2016-11-26 LAB — GLUCOSE, CAPILLARY
GLUCOSE-CAPILLARY: 111 mg/dL — AB (ref 65–99)
Glucose-Capillary: 121 mg/dL — ABNORMAL HIGH (ref 65–99)
Glucose-Capillary: 126 mg/dL — ABNORMAL HIGH (ref 65–99)
Glucose-Capillary: 138 mg/dL — ABNORMAL HIGH (ref 65–99)
Glucose-Capillary: 176 mg/dL — ABNORMAL HIGH (ref 65–99)
Glucose-Capillary: 207 mg/dL — ABNORMAL HIGH (ref 65–99)

## 2016-11-26 LAB — POCT I-STAT 3, ART BLOOD GAS (G3+)
ACID-BASE DEFICIT: 3 mmol/L — AB (ref 0.0–2.0)
BICARBONATE: 24.1 mmol/L (ref 20.0–28.0)
O2 SAT: 98 %
PO2 ART: 119 mmHg — AB (ref 83.0–108.0)
TCO2: 26 mmol/L (ref 22–32)
pCO2 arterial: 51.4 mmHg — ABNORMAL HIGH (ref 32.0–48.0)
pH, Arterial: 7.283 — ABNORMAL LOW (ref 7.350–7.450)

## 2016-11-26 LAB — TRIGLYCERIDES: TRIGLYCERIDES: 138 mg/dL (ref <150)

## 2016-11-26 SURGERY — LAPAROTOMY, EXPLORATORY
Anesthesia: General | Site: Abdomen

## 2016-11-26 MED ORDER — PHENYLEPHRINE 40 MCG/ML (10ML) SYRINGE FOR IV PUSH (FOR BLOOD PRESSURE SUPPORT)
PREFILLED_SYRINGE | INTRAVENOUS | Status: AC
  Filled 2016-11-26: qty 10

## 2016-11-26 MED ORDER — PHENYLEPHRINE HCL 10 MG/ML IJ SOLN
INTRAVENOUS | Status: DC | PRN
  Administered 2016-11-26: 25 ug/min via INTRAVENOUS

## 2016-11-26 MED ORDER — FENTANYL CITRATE (PF) 100 MCG/2ML IJ SOLN
INTRAMUSCULAR | Status: DC | PRN
  Administered 2016-11-26: 100 ug via INTRAVENOUS
  Administered 2016-11-26: 150 ug via INTRAVENOUS

## 2016-11-26 MED ORDER — PROPOFOL 10 MG/ML IV BOLUS
INTRAVENOUS | Status: AC
  Filled 2016-11-26: qty 20

## 2016-11-26 MED ORDER — PROPOFOL 10 MG/ML IV BOLUS
INTRAVENOUS | Status: DC | PRN
  Administered 2016-11-26: 30 mg via INTRAVENOUS

## 2016-11-26 MED ORDER — FENTANYL CITRATE (PF) 250 MCG/5ML IJ SOLN
INTRAMUSCULAR | Status: AC
  Filled 2016-11-26: qty 5

## 2016-11-26 MED ORDER — MIDAZOLAM HCL 2 MG/2ML IJ SOLN
INTRAMUSCULAR | Status: AC
  Filled 2016-11-26: qty 2

## 2016-11-26 MED ORDER — ORAL CARE MOUTH RINSE
15.0000 mL | OROMUCOSAL | Status: DC
  Administered 2016-11-26 – 2016-11-30 (×45): 15 mL via OROMUCOSAL

## 2016-11-26 MED ORDER — FENTANYL 2500MCG IN NS 250ML (10MCG/ML) PREMIX INFUSION
25.0000 ug/h | INTRAVENOUS | Status: DC
  Administered 2016-11-26: 300 ug/h via INTRAVENOUS
  Administered 2016-11-26: 350 ug/h via INTRAVENOUS
  Administered 2016-11-26 – 2016-11-30 (×10): 300 ug/h via INTRAVENOUS
  Administered 2016-11-30: 75 ug/h via INTRAVENOUS
  Filled 2016-11-26 (×13): qty 250

## 2016-11-26 MED ORDER — CHLORHEXIDINE GLUCONATE 0.12% ORAL RINSE (MEDLINE KIT)
15.0000 mL | Freq: Two times a day (BID) | OROMUCOSAL | Status: DC
  Administered 2016-11-26 – 2016-12-06 (×18): 15 mL via OROMUCOSAL
  Filled 2016-11-26: qty 15

## 2016-11-26 MED ORDER — DEXTROSE 5 % IV SOLN
2.0000 g | INTRAVENOUS | Status: AC
  Administered 2016-11-26: 2 g via INTRAVENOUS
  Filled 2016-11-26: qty 2

## 2016-11-26 MED ORDER — LIDOCAINE 2% (20 MG/ML) 5 ML SYRINGE
INTRAMUSCULAR | Status: AC
  Filled 2016-11-26: qty 5

## 2016-11-26 MED ORDER — ONDANSETRON HCL 4 MG/2ML IJ SOLN
INTRAMUSCULAR | Status: AC
  Filled 2016-11-26: qty 2

## 2016-11-26 MED ORDER — MIDAZOLAM HCL 5 MG/5ML IJ SOLN
INTRAMUSCULAR | Status: DC | PRN
  Administered 2016-11-26: 2 mg via INTRAVENOUS

## 2016-11-26 MED ORDER — ROCURONIUM BROMIDE 100 MG/10ML IV SOLN
INTRAVENOUS | Status: DC | PRN
  Administered 2016-11-26: 60 mg via INTRAVENOUS

## 2016-11-26 MED ORDER — 0.9 % SODIUM CHLORIDE (POUR BTL) OPTIME
TOPICAL | Status: DC | PRN
  Administered 2016-11-26: 4000 mL

## 2016-11-26 MED ORDER — CEFOTETAN DISODIUM-DEXTROSE 2-2.08 GM-% IV SOLR
INTRAVENOUS | Status: AC
  Filled 2016-11-26: qty 50

## 2016-11-26 MED ORDER — SODIUM CHLORIDE 0.9 % IV BOLUS (SEPSIS)
1000.0000 mL | Freq: Once | INTRAVENOUS | Status: AC
  Administered 2016-11-26: 1000 mL via INTRAVENOUS

## 2016-11-26 MED ORDER — ROCURONIUM BROMIDE 10 MG/ML (PF) SYRINGE
PREFILLED_SYRINGE | INTRAVENOUS | Status: AC
  Filled 2016-11-26: qty 5

## 2016-11-26 SURGICAL SUPPLY — 40 items
CHLORAPREP W/TINT 26ML (MISCELLANEOUS) ×3 IMPLANT
CLIP VESOCCLUDE LG 6/CT (CLIP) ×3 IMPLANT
CLIP VESOCCLUDE MED 6/CT (CLIP) ×3 IMPLANT
CLIP VESOCCLUDE SM WIDE 6/CT (CLIP) ×3 IMPLANT
COVER SURGICAL LIGHT HANDLE (MISCELLANEOUS) ×3 IMPLANT
DRAPE LAPAROSCOPIC ABDOMINAL (DRAPES) ×3 IMPLANT
DRAPE WARM FLUID 44X44 (DRAPE) ×3 IMPLANT
DRSG OPSITE POSTOP 4X10 (GAUZE/BANDAGES/DRESSINGS) IMPLANT
DRSG OPSITE POSTOP 4X8 (GAUZE/BANDAGES/DRESSINGS) IMPLANT
DRSG PAD ABDOMINAL 8X10 ST (GAUZE/BANDAGES/DRESSINGS) ×3 IMPLANT
ELECT BLADE 6.5 EXT (BLADE) IMPLANT
ELECT CAUTERY BLADE 6.4 (BLADE) ×3 IMPLANT
ELECT REM PT RETURN 9FT ADLT (ELECTROSURGICAL) ×3
ELECTRODE REM PT RTRN 9FT ADLT (ELECTROSURGICAL) ×1 IMPLANT
GLOVE BIOGEL PI IND STRL 7.0 (GLOVE) ×1 IMPLANT
GLOVE BIOGEL PI INDICATOR 7.0 (GLOVE) ×2
GLOVE SURG SS PI 7.0 STRL IVOR (GLOVE) ×3 IMPLANT
GOWN STRL REUS W/ TWL LRG LVL3 (GOWN DISPOSABLE) ×2 IMPLANT
GOWN STRL REUS W/TWL LRG LVL3 (GOWN DISPOSABLE) ×4
KIT BASIN OR (CUSTOM PROCEDURE TRAY) ×3 IMPLANT
LIGASURE IMPACT 36 18CM CVD LR (INSTRUMENTS) IMPLANT
LOOP VESSEL MAXI BLUE (MISCELLANEOUS) IMPLANT
LOOP VESSEL MINI RED (MISCELLANEOUS) IMPLANT
PACK GENERAL/GYN (CUSTOM PROCEDURE TRAY) ×3 IMPLANT
RETAINER VISCERA MED (MISCELLANEOUS) ×3 IMPLANT
SPONGE LAP 18X18 X RAY DECT (DISPOSABLE) IMPLANT
STAPLER VISISTAT 35W (STAPLE) ×3 IMPLANT
SUCTION POOLE TIP (SUCTIONS) ×3 IMPLANT
SUT PDS AB 0 CT 36 (SUTURE) ×18 IMPLANT
SUT PDS AB 1 TP1 96 (SUTURE) IMPLANT
SUT SILK 2 0 (SUTURE) ×2
SUT SILK 2 0 SH CR/8 (SUTURE) ×3 IMPLANT
SUT SILK 2-0 18XBRD TIE 12 (SUTURE) ×1 IMPLANT
SUT SILK 3 0 (SUTURE) ×2
SUT SILK 3 0 SH CR/8 (SUTURE) ×6 IMPLANT
SUT SILK 3-0 18XBRD TIE 12 (SUTURE) ×1 IMPLANT
TOWEL OR 17X26 10 PK STRL BLUE (TOWEL DISPOSABLE) ×3 IMPLANT
TRAY FOLEY W/METER SILVER 16FR (SET/KITS/TRAYS/PACK) ×3 IMPLANT
WND VAC CANISTER 500ML (MISCELLANEOUS) ×3 IMPLANT
YANKAUER SUCT BULB TIP NO VENT (SUCTIONS) IMPLANT

## 2016-11-26 NOTE — Plan of Care (Signed)
Problem: Cardiac: Goal: Ability to maintain an adequate cardiac output will improve Outcome: Progressing SBP maintaining >90

## 2016-11-26 NOTE — Progress Notes (Addendum)
PULMONARY / CRITICAL CARE MEDICINE   Name: Krystal Campbell MRN: [id number] DOB: 08/20/1960    ADMISSION DATE:  11/23/2016 CONSULTATION DATE:  11/25/16  REFERRING MD:  Dr Brantley Stage  CHIEF COMPLAINT:  Consult for assistance with mechanical ventilation post-op  HISTORY OF PRESENT ILLNESS:   At the time my examination the patient is intubated and sedated and thusly unable to provide history of present illness, review of systems, past medical history, family history, social history, home medications, or allergies. Histories are obtained from family, medical providers, with a medical record.  56 year old woman admitted after surgical repair for chronic seroma with removal of mesh and repair of incisional hernia. On postop day 1 this was complicated by recurrent incisional hernia with small bowel obstruction. She was taken to the operating room and had an exploratory laparotomy and placement of open it abdominal VAC. Postoperatively she was left intubated and is being transferred to the intensive care unit. I see was consult by the attending surgeon for assistance with management of mechanical ventilation and ICU care.  PAST MEDICAL HISTORY :  She  has a past medical history of Anemia; Anxiety; Arthritis; Cancer ([place]); Carpal tunnel syndrome of right wrist; Chronic kidney disease; Chronic pain syndrome; Depression; Diabetes mellitus without complication ([place]); Diverticulosis; GERD (gastroesophageal reflux disease); History of blood transfusion; History of hiatal hernia; Hyperlipidemia; Incisional hernia; Pneumonia; Pulmonary embolism ([place]); and Wears glasses.  PAST SURGICAL HISTORY: She  has a past surgical history that includes Replacement total knee; Abdominal hysterectomy; Kidney surgery; Joint replacement (4/15); Colonoscopy with propofol (Left, 10/30/2014); Esophagogastroduodenoscopy (egd) with propofol (Left, 10/30/2014); Laparoscopic partial colectomy (N/A, 11/04/2014); IVC FILTER PLACEMENT ([place] [facility]);  Appendectomy; Dilation and curettage of uterus; Portacath placement (Right, 12/22/2014); Incisional hernia repair (N/A, 01/17/2016); Insertion of mesh (N/A, 01/17/2016); Port-a-cath removal (Right, 03/07/2016); ir generic historical (06/19/2016); Incision and drainage abscess (N/A, 07/06/2016); IVC filter removed; Left knee arthroscopy; Incision and drainage abscess (N/A, 08/09/2016); IR Radiologist Eval & Mgmt (06/13/2016); IR Radiologist Eval & Mgmt (06/27/2016); Tubal ligation; Excision of mesh (N/A, 11/23/2016); Incisional hernia repair (N/A, 11/23/2016); and laparotomy (N/A, 11/25/2016).  No Known Allergies  No current facility-administered medications on file prior to encounter.    Current Outpatient Prescriptions on File Prior to Encounter  Medication Sig  . aspirin EC 81 MG tablet Take 81 mg by mouth daily.  . Melatonin 10 MG CAPS Take 20 mg by mouth at bedtime.   . pantoprazole (PROTONIX) 40 MG tablet Take 1 tablet (40 mg total) by mouth 2 (two) times daily.  Marland Kitchen ibuprofen (ADVIL,MOTRIN) 800 MG tablet Take 1 tablet (800 mg total) by mouth every 8 (eight) hours as needed. (Patient not taking: Reported on 11/16/2016)  . oxyCODONE (OXY IR/ROXICODONE) 5 MG immediate release tablet Take 1-2 tablets (5-10 mg total) by mouth every 6 (six) hours as needed for severe pain. (Patient not taking: Reported on 11/16/2016)  . vitamin B-12 1000 MCG tablet Take 1 tablet (1,000 mcg total) by mouth daily. (Patient not taking: Reported on 11/16/2016)    FAMILY HISTORY:  Her indicated that her mother is alive. She indicated that her father is alive. She indicated that all of her three sisters are alive. She indicated that the status of her other is unknown.    SOCIAL HISTORY: She  reports that she quit smoking about 48 years ago. Her smoking use included Cigarettes. She has a 5.00 pack-year smoking history. She has never used smokeless tobacco. She reports that she drinks alcohol. She reports that she uses  drugs, including  Marijuana.   VITAL SIGNS: BP (!) 83/56   Pulse 97   Temp 99 F (37.2 C) (Axillary)   Resp 12   Ht 5\' 4"  (1.626 m)   Wt 258 lb (117 kg)   SpO2 99%   BMI 44.29 kg/m   HEMODYNAMICS:    VENTILATOR SETTINGS: Vent Mode: PRVC FiO2 (%):  [40 %-100 %] 60 % Set Rate:  [12 bmp] 12 bmp Vt Set:  [480 mL] 480 mL PEEP:  [5 cmH20] 5 cmH20 Plateau Pressure:  [12 cmH20-27 cmH20] 27 cmH20  INTAKE / OUTPUT: I/O last 3 completed shifts: In: 3195.8 [P.O.:120; I.V.:2475.8; Other:350; IV Piggyback:250] Out: 5027 [XAJOI:7867; Emesis/NG output:400; Drains:1620; Blood:50]  PHYSICAL EXAMINATION: Blood pressure (!) 89/58, pulse 97, temperature 99 F (37.2 C), temperature source Axillary, resp. rate 12, height 5\' 4"  (1.626 m), weight 258 lb (117 kg), SpO2 99 %. Gen:      No acute distress HEENT:  EOMI, sclera anicteric Neck:     No masses; no thyromegaly, ETT Lungs:    Clear to auscultation bilaterally; normal respiratory effort CV:         Regular rate and rhythm; no murmurs Abd:      Abd surgical wound with vac Ext:    No edema; adequate peripheral perfusion Skin:      Warm and dry; no rash Neuro: Sedated  LABS:  BMET  Recent Labs Lab 11/24/16 0436 11/25/16 0436 11/26/16 0152  NA 136 137 136  K 4.9 4.5 4.4  CL 103 103 102  CO2 24 28 30   BUN 8 6 7   CREATININE 0.87 0.83 0.91  GLUCOSE 177* 180* 195*    Electrolytes  Recent Labs Lab 11/24/16 0436 11/25/16 0436 11/26/16 0152  CALCIUM 8.4* 8.7* 8.1*    CBC  Recent Labs Lab 11/20/16 0916 11/24/16 0436 11/26/16 0152  WBC 6.3 9.6 10.2  HGB 12.3 11.5* 10.5*  HCT 39.0 38.0 33.6*  PLT 289 250 275    Coag's No results for input(s): APTT, INR in the last 168 hours.  Sepsis Markers No results for input(s): LATICACIDVEN, PROCALCITON, O2SATVEN in the last 168 hours.  ABG  Recent Labs Lab 11/25/16 2040 11/26/16 0007  PHART 7.345* 7.390  PCO2ART 46.8 44.0  PO2ART 88.8 292*    Liver Enzymes  Recent Labs Lab  11/20/16 0916 11/24/16 0436  AST 20 22  ALT 14 11*  ALKPHOS 83 68  BILITOT 0.4 0.7  ALBUMIN 3.4* 2.9*    Cardiac Enzymes No results for input(s): TROPONINI, PROBNP in the last 168 hours.  Glucose  Recent Labs Lab 11/25/16 1556 11/25/16 1844 11/25/16 2145 11/26/16 0008 11/26/16 0417 11/26/16 0746  GLUCAP 169* 164* 205* 207* 176* 121*    Imaging Ct Abdomen Pelvis W Contrast  Result Date: 11/25/2016 CLINICAL DATA:  56 year old female with left abdominal pain and swelling following abdominal wall seroma excision, hernia mesh removal and repair 2 days ago. EXAM: CT ABDOMEN AND PELVIS WITH CONTRAST TECHNIQUE: Multidetector CT imaging of the abdomen and pelvis was performed using the standard protocol following bolus administration of intravenous contrast. CONTRAST:  19mL ISOVUE-300 IOPAMIDOL (ISOVUE-300) INJECTION 61% COMPARISON:  10/17/2016 and prior CTs FINDINGS: Lower chest: Mild bibasilar atelectasis noted. Hepatobiliary: The liver and gallbladder are unremarkable. No biliary dilatation. Pancreas: Unremarkable Spleen: Unremarkable except for calcified granulomas Adrenals/Urinary Tract: Atrophic right kidney again noted. The left kidney, adrenal glands and bladder are unremarkable. Stomach/Bowel: A large left paramedian abdominal wall hernia is noted containing multiple dilated small  bowel loops. Distension of small bowel loops proximal to the hernia noted with collapsed small bowel loops distal to the hernia. There is no evidence of pneumoperitoneum or focal abscess. A percutaneous drain is present within the hernia. Vascular/Lymphatic: No significant vascular findings are present. The visualized mesenteric vessels appear unremarkable. No enlarged abdominal or pelvic lymph nodes. Reproductive: Bilateral tubal ligation clips again noted. No other significant abnormality Other: No ascites. Musculoskeletal: No acute abnormality. IMPRESSION: 1. Large left paramedian abdominal wall hernia  containing multiple dilated loops of small bowel, compatible with small bowel obstruction. No evidence of pneumoperitoneum or abscess. 2. Mild bibasilar atelectasis 3. These results will be called to the ordering clinician or representative by the Radiologist Assistant, and communication documented in the PACS or zVision Dashboard. Electronically Signed   By: Margarette Canada M.D.   On: 11/25/2016 14:33   Dg Chest Port 1 View  Result Date: 11/25/2016 CLINICAL DATA:  OG tube placement and attempt at central venous catheter placement. EXAM: PORTABLE CHEST 1 VIEW COMPARISON:  None. FINDINGS: An endotracheal tube is identified with tip at the carina. An OG tube is present with tip overlying the proximal- mid stomach. This is a low volume film with bibasilar atelectasis. No evidence of pleural effusion or pneumothorax. IMPRESSION: Endotracheal tube with tip at the carina -recommend 2 cm retraction. Diane, nurse for this patient was notified on 11/25/2016 at 7:35 pm. OG tube with tip overlying the proximal - mid stomach. Low volume film with bibasilar atelectasis. Electronically Signed   By: Margarette Canada M.D.   On: 11/25/2016 19:35     STUDIES:    CULTURES: None  ANTIBIOTICS: None  SIGNIFICANT EVENTS: 8/30: Removal of mesh 9/1: Exploratory laparotomy wound VAC  LINES/TUBES: 9/1: Intubated  DISCUSSION: 56 year old woman admitted to the ICU postoperatively after urgent exploratory laparotomy for small bowel obstruction due to incisional hernia one day after having removal of abdominal mesh. On examination her vital signs are appropriate she appears well perfused with adequate urine output and soft abdomen.  ASSESSMENT / PLAN:  PULMONARY A: Intubated for airway protection P:   Full vent support as she is going back to OR today Follow CXR today to check ET placement  CARDIOVASCULAR A:  No active issues P:  Telemetry Hold home aspirin  RENAL A:   No active issues P:   Monitor  BMP  GASTROINTESTINAL A:   Small bowel obstruction status post exploratory lap GI prophylaxis P:   Management of open abdomen per surgery Protonix IV NGT to LIS  HEMATOLOGIC A:   Normocytic anemia DVT prophylaxis P:  Follow CBC  INFECTIOUS A:   No active issue, Op-note reports no perforations and well-perfused small bowel P:   Cefotan for surgical prophylaxis  ENDOCRINE A:   DM   P:   SSI coverage  NEUROLOGIC A:   Pain and sedation P:   RASS goal: -1 Fentanyl, propofol   FAMILY  - Updates: No family at bedside - Inter-disciplinary family meet or Palliative Care meeting due by:  12/02/16  The patient is critically ill with multiple organ system failure and requires high complexity decision making for assessment and support, frequent evaluation and titration of therapies, advanced monitoring, review of radiographic studies and interpretation of complex data.   Critical Care Time devoted to patient care services, exclusive of separately billable procedures, described in this note is 35 minutes.   Marshell Garfinkel MD [facility] Pager [phone] If no answer or after  3pm call: [phone] 11/26/2016, 9:16 AM

## 2016-11-26 NOTE — Progress Notes (Signed)
[facility] Progress Note Patient Name: Krystal Campbell DOB: 1960/05/01 MRN: [id number]   Date of Service  11/26/2016  HPI/Events of Note  Low urine output. Borderline hypotension. Positive approximately 2.4 L.   eICU Interventions  Ordering 1 L saline over 4 hours IV      Intervention Category Intermediate Interventions: Other:  Tera Partridge 11/26/2016, 5:04 PM

## 2016-11-26 NOTE — Op Note (Signed)
Preoperative diagnosis: evisceration  Postoperative diagnosis: same   Procedure: closure of lateral abdominal fascial for 20cm in length, opening of midline fascia  Surgeon: Gurney Maxin, M.D.  Asst: Catalina Antigua Tsuei  Anesthesia: general  Indications for procedure: Krystal Campbell is a 56 y.o. year old female with evisceration yesterday that was fixed with temporary closure vac. Overnight her abdomen became more distended concerning for reherniation of bowel above fascia.  Description of procedure: The patient was brought into the operative suite. Anesthesia was administered with General endotracheal anesthesia. WHO checklist was applied. The patient was then placed in supine position. The area was prepped and draped in the usual sterile fashion.  The previous blue vac was removed and the small intestine was seen in the lateral space above the fascia. The abdomen and wound was irrigated with 2L of warm saline. The intestine appeared healthy and viable. Due to the recurrence of evisceration, decision was made to close the lateral space and reopen the midline space to allow easier management of the complication. Both fascial edges were identified and the tissues appeared healthy.  Therefore, the interrupted sutures in the midline fascia were cut and removed. The posterior lateral fascia was then sutured to the lateral aspect of the anterior rectus fascia using interrupted 0 PDS sutures. After closure the rectus was being pulled laterally and the defect was approximately 8cm at greatest width. No attempt was made to close the defect in any area.   Next the abtherra advanced device was put in place, the perforated visceral covering was put in place and then the blue sponge was cut to the fascial defect and the excess sponge put into the left lateral subcutaneous cavity to help the wound heal and a final blue sponge vac was put in the subcutaneous space. The covering was put in place and applied to suction  with good seal. The patient returned to the ICU on the ventilator for further care. All counts were correct.  Findings: small intestine evisceration lateral above the lateral fascia, 8 x 10cm midline defect at end of case  Specimen: none  Implant: abtherra advanced sponge   Blood loss: <110ml  Local anesthesia: none  Complications: none  Dispo: plan to return to the operating room in 48-72h for assessment for abdominal closure  Gurney Maxin, M.D. General, Bariatric, & Minimally Invasive Surgery [facility]

## 2016-11-26 NOTE — Progress Notes (Signed)
Discussed about  pt abdomen with Dr Maggie Font this morning. Will continue to monitor and MDs will see pt in a few hours

## 2016-11-26 NOTE — Progress Notes (Signed)
Pt had ABG at 0004 by Rt and no changes made. 5:00am gas will be held at this time.  RT will continue to monitor

## 2016-11-26 NOTE — Anesthesia Postprocedure Evaluation (Signed)
Anesthesia Post Note  Patient: Krystal Campbell  Procedure(s) Performed: Procedure(s) (LRB): EXPLORATORY LAPAROTOMY REOPENING OF MIDLINE WOUND (N/A)     Patient location during evaluation: ICU Anesthesia Type: General Level of consciousness: patient remains intubated per anesthesia plan Pain management: pain level controlled Vital Signs Assessment: post-procedure vital signs reviewed and stable Respiratory status: patient remains intubated per anesthesia plan Cardiovascular status: stable Anesthetic complications: no    Last Vitals:  Vitals:   11/26/16 0743 11/26/16 0800  BP: (!) 89/58   Pulse:    Resp: 13 12  Temp:    SpO2:      Last Pain:  Vitals:   11/26/16 0739  TempSrc: Axillary  PainSc:                  Christobal Morado

## 2016-11-26 NOTE — Transfer of Care (Signed)
Immediate Anesthesia Transfer of Care Note  Patient: Krystal Campbell  Procedure(s) Performed: Procedure(s): EXPLORATORY LAPAROTOMY REOPENING OF MIDLINE WOUND (N/A)  Patient Location: PACU  Anesthesia Type:General  Level of Consciousness: sedated and Patient remains intubated per anesthesia plan  Airway & Oxygen Therapy: Patient remains intubated per anesthesia plan and Patient placed on Ventilator (see vital sign flow sheet for setting)  Post-op Assessment: Report given to RN and Post -op Vital signs reviewed and stable  Post vital signs: Reviewed and stable  Last Vitals:  Vitals:   11/26/16 0743 11/26/16 0800  BP: (!) 89/58   Pulse:    Resp: 13 12  Temp:    SpO2:      Last Pain:  Vitals:   11/26/16 0739  TempSrc: Axillary  PainSc:       Patients Stated Pain Goal: 2 (06/08/92 5859)  Complications: No apparent anesthesia complications

## 2016-11-26 NOTE — Progress Notes (Signed)
[place] Progress Note Patient Name: Krystal Campbell DOB: 08-Dec-1960 MRN: [id number]   Date of Service  11/26/2016  HPI/Events of Note  Camera check on patient. Ventilator adjusted to achieve appropriate tidal volumes by respiratory therapist at bedside. Patient appears to be more comfortable on current ventilator settings and achieving adequate volumes. Obese. Suspect trans-pulmonary pressure gradient is significant. Peak pressures in the mid 20s. Endotracheal tube was inserted 1 cm for patient comfort. Review of portable chest x-ray after arrival from the operating room indicates the endotracheal tube was approximately 1 cm above carina.   eICU Interventions  1. Checking stat portable chest x-ray 2. Increasing PEEP to 8 3. Ventilator order adjusted for current ventilator settings 4. Plan for repeat ABG at 4:30 PM      Intervention Category Major Interventions: Respiratory failure - evaluation and management  Tera Partridge 11/26/2016, 4:03 PM

## 2016-11-26 NOTE — Progress Notes (Signed)
[place] Progress Note Patient Name: LENELL MCCONNELL DOB: 10/23/1960 MRN: [id number]   Date of Service  11/26/2016  HPI/Events of Note  Patient with mild hypercarbia based on ABG. PH 7.28. Currently with respiratory rate 18 & patient breathing along with ventilator.   eICU Interventions  1. Increased 7 ventilator rate to 24 breaths per minute 2. Repeat ABG with a.m. labs      Intervention Category Major Interventions: Acid-Base disturbance - evaluation and management;Respiratory failure - evaluation and management  Tera Partridge 11/26/2016, 5:28 PM

## 2016-11-26 NOTE — Progress Notes (Signed)
Rt to bedside for routine vent check. Pt Exhaled VT found to be less than 100, with minimal chest rise. Several adjustments were made to ventilator in effort to improve ventilation. Ventilator subsequently changed out with no improvement. Increased set RR from 12 to 18 with pt appearing more comfortable. E-link MD made aware and agreed with vent change in addition to increasing peep to 8 given pt's body size. ETT was found at 87, advanced to 20 with improvements in ventilation as well. CCM MD ordering CXR and ABG to check pt status. RT will continue to closely monitor.

## 2016-11-26 NOTE — Progress Notes (Signed)
[place] [facility] Progress Note Patient Name: Krystal Campbell DOB: May 22, 1960 MRN: [id number]   Date of Service  11/26/2016  HPI/Events of Note  Endotracheal tube on portable chest x-ray appears to be 2 cm above carina on my review.   eICU Interventions  Continuing current endotracheal tube positioning.      Intervention Category Major Interventions: Respiratory failure - evaluation and management  Tera Partridge 11/26/2016, 4:31 PM

## 2016-11-26 NOTE — Anesthesia Preprocedure Evaluation (Signed)
Anesthesia Evaluation  Patient identified by MRN, date of birth, ID band Patient unresponsive    Airway Mallampati: Intubated       Dental   Pulmonary pneumonia, former smoker,    breath sounds clear to auscultation       Cardiovascular hypertension,  Rhythm:Regular Rate:Normal     Neuro/Psych    GI/Hepatic Neg liver ROS, hiatal hernia, GERD  ,History noted.    Endo/Other  diabetes  Renal/GU Renal disease     Musculoskeletal   Abdominal   Peds  Hematology  (+) anemia ,   Anesthesia Other Findings   Reproductive/Obstetrics                             Anesthesia Physical Anesthesia Plan  ASA: IV  Anesthesia Plan: General   Post-op Pain Management:    Induction: Intravenous  PONV Risk Score and Plan: 2 and Ondansetron and Treatment may vary due to age or medical condition  Airway Management Planned: Oral ETT  Additional Equipment:   Intra-op Plan:   Post-operative Plan: Post-operative intubation/ventilation  Informed Consent: I have reviewed the patients History and Physical, chart, labs and discussed the procedure including the risks, benefits and alternatives for the proposed anesthesia with the patient or authorized representative who has indicated his/her understanding and acceptance.     Plan Discussed with: CRNA, Anesthesiologist and Surgeon  Anesthesia Plan Comments:         Anesthesia Quick Evaluation

## 2016-11-26 NOTE — Progress Notes (Signed)
Progress Note: General Surgery Service   Assessment/Plan: Patient Active Problem List   Diagnosis Date Noted  . Abdominal wall seroma ([place]) 11/23/2016  . S/P hernia surgery 01/17/2016  . 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 Obesity 11/10/2014  . Acute pulmonary embolism ([place]) 10/26/2014  . Iron deficiency anemia 10/26/2014  . Cecal lesion 10/25/2014  . Anxiety state 10/25/2014  . Absolute anemia   . GERD (gastroesophageal reflux disease) 10/10/2014  . Microcytic anemia 10/09/2014  . Diabetes ([place]) 10/09/2014  . Hypertension 10/09/2014   s/p Procedure(s): EXPLORATORY LAPAROTOMY AND PLACEMENT OF OPEN ABDOMEN VAC 11/25/2016 Distension on left side concerning for new evisceration after temporary vac placement yesterday -NPO -OR today for assessment of intestine    LOS: 3 days  Chief Complaint/Subjective: Overnight had vomiting issue x2 and then left abdomen notably more distended  Objective: Vital signs in last 24 hours: Temp:  [97.3 F (36.3 C)-99 F (37.2 C)] 99 F (37.2 C) (09/02 0739) Pulse Rate:  [73-100] 97 (09/02 0700) Resp:  [11-24] 12 (09/02 0800) BP: (81-184)/(53-109) 83/56 (09/02 0700) SpO2:  [97 %-100 %] 99 % (09/02 0700) FiO2 (%):  [40 %-100 %] 60 % (09/02 0800) Last BM Date: 11/22/16  Intake/Output from previous day: 09/01 0701 - 09/02 0700 In: 2100.8 [I.V.:1500.8; IV Piggyback:250] Out: 2630 [Urine:1310; Emesis/NG output:400; Drains:870; Blood:50] Intake/Output this shift: Total I/O In: 79.5 [I.V.:79.5] Out: -   Lungs: CTAB  Cardiovascular: RRR  Abd: soft, left side with abnormal distension, blue vac in place, no skin changes  Extremities: no edema  Neuro: GCS 10t  Lab Results: CBC   Recent Labs  11/24/16 0436 11/26/16 0152  WBC 9.6 10.2  HGB 11.5* 10.5*  HCT 38.0 33.6*  PLT 250 275   BMET  Recent Labs  11/25/16 0436 11/26/16 0152  NA 137 136  K 4.5 4.4  CL 103 102  CO2 28 30  GLUCOSE 180* 195*  BUN 6 7    CREATININE 0.83 0.91  CALCIUM 8.7* 8.1*   PT/INR No results for input(s): LABPROT, INR in the last 72 hours. ABG  Recent Labs  11/25/16 2040 11/26/16 0007  PHART 7.345* 7.390  HCO3 24.9 26.2    Studies/Results:  Anti-infectives: Anti-infectives    Start     Dose/Rate Route Frequency Ordered Stop   11/26/16 0900  cefoTEtan (CEFOTAN) 2 g in dextrose 5 % 50 mL IVPB     2 g 100 mL/hr over 30 Minutes Intravenous On call to O.R. 11/26/16 0848 11/27/16 0559   11/25/16 1545  ceFAZolin (ANCEF) IVPB 2g/100 mL premix     2 g 200 mL/hr over 30 Minutes Intravenous  Once 11/25/16 1539 11/25/16 1708   11/23/16 1730  ceFAZolin (ANCEF) IVPB 2g/100 mL premix     2 g 200 mL/hr over 30 Minutes Intravenous Every 8 hours 11/23/16 1558 11/23/16 1722   11/23/16 0600  ceFAZolin (ANCEF) 3 g in dextrose 5 % 50 mL IVPB     3 g 130 mL/hr over 30 Minutes Intravenous On call to O.R. 11/22/16 1331 11/23/16 0945      Medications: Scheduled Meds: . chlorhexidine gluconate (MEDLINE KIT)  15 mL Mouth Rinse BID  . citalopram  20 mg Per Tube TID  . fentaNYL (SUBLIMAZE) injection  50 mcg Intravenous Once  . gabapentin  600 mg Per Tube Q8H  . heparin  5,000 Units Subcutaneous Q8H  . insulin aspart  0-15 Units Subcutaneous Q4H  . mouth rinse  15 mL Mouth Rinse 10  times per day  . pantoprazole (PROTONIX) IV  40 mg Intravenous Q12H   Continuous Infusions: . albumin human    . cefoTEtan (CEFOTAN) IV    . fentaNYL infusion INTRAVENOUS    . lactated ringers 75 mL/hr at 11/26/16 0730  . propofol (DIPRIVAN) infusion 5 mcg/kg/min (11/26/16 0805)   PRN Meds:.diphenhydrAMINE **OR** [DISCONTINUED] diphenhydrAMINE, fentaNYL, hydrALAZINE, metoprolol tartrate, naloxone **AND** sodium chloride flush, [DISCONTINUED] ondansetron **OR** ondansetron (ZOFRAN) IV, promethazine  Mickeal Skinner, MD Pg# [phone] [facility]

## 2016-11-27 ENCOUNTER — Encounter (HOSPITAL_COMMUNITY): Payer: Self-pay | Admitting: General Surgery

## 2016-11-27 DIAGNOSIS — K439 Ventral hernia without obstruction or gangrene: Secondary | ICD-10-CM

## 2016-11-27 LAB — RENAL FUNCTION PANEL
ALBUMIN: 2.2 g/dL — AB (ref 3.5–5.0)
Anion gap: 8 (ref 5–15)
BUN: 16 mg/dL (ref 6–20)
CO2: 23 mmol/L (ref 22–32)
Calcium: 7.8 mg/dL — ABNORMAL LOW (ref 8.9–10.3)
Chloride: 107 mmol/L (ref 101–111)
Creatinine, Ser: 1.93 mg/dL — ABNORMAL HIGH (ref 0.44–1.00)
GFR, EST AFRICAN AMERICAN: 32 mL/min — AB (ref >60)
GFR, EST NON AFRICAN AMERICAN: 28 mL/min — AB (ref >60)
GLUCOSE: 118 mg/dL — AB (ref 65–99)
PHOSPHORUS: 2.4 mg/dL — AB (ref 2.5–4.6)
POTASSIUM: 4.1 mmol/L (ref 3.5–5.1)
Sodium: 138 mmol/L (ref 135–145)

## 2016-11-27 LAB — BLOOD GAS, ARTERIAL
Acid-Base Excess: 0.3 mmol/L (ref 0.0–2.0)
BICARBONATE: 23.8 mmol/L (ref 20.0–28.0)
Drawn by: 51133
FIO2: 40
LHR: 24 {breaths}/min
O2 Saturation: 98.2 %
PATIENT TEMPERATURE: 98.6
PCO2 ART: 34.3 mmHg (ref 32.0–48.0)
PEEP: 8 cmH2O
VT: 480 mL
pH, Arterial: 7.456 — ABNORMAL HIGH (ref 7.350–7.450)
pO2, Arterial: 124 mmHg — ABNORMAL HIGH (ref 83.0–108.0)

## 2016-11-27 LAB — CBC WITH DIFFERENTIAL/PLATELET
BASOS ABS: 0 10*3/uL (ref 0.0–0.1)
BASOS PCT: 0 %
EOS ABS: 0.3 10*3/uL (ref 0.0–0.7)
Eosinophils Relative: 5 %
HCT: 30.9 % — ABNORMAL LOW (ref 36.0–46.0)
Hemoglobin: 9.5 g/dL — ABNORMAL LOW (ref 12.0–15.0)
Lymphocytes Relative: 11 %
Lymphs Abs: 0.8 10*3/uL (ref 0.7–4.0)
MCH: 28.5 pg (ref 26.0–34.0)
MCHC: 30.7 g/dL (ref 30.0–36.0)
MCV: 92.8 fL (ref 78.0–100.0)
MONO ABS: 0.7 10*3/uL (ref 0.1–1.0)
MONOS PCT: 10 %
NEUTROS ABS: 5.3 10*3/uL (ref 1.7–7.7)
Neutrophils Relative %: 74 %
PLATELETS: 200 10*3/uL (ref 150–400)
RBC: 3.33 MIL/uL — ABNORMAL LOW (ref 3.87–5.11)
RDW: 15.1 % (ref 11.5–15.5)
WBC: 7.1 10*3/uL (ref 4.0–10.5)

## 2016-11-27 LAB — GLUCOSE, CAPILLARY
GLUCOSE-CAPILLARY: 106 mg/dL — AB (ref 65–99)
GLUCOSE-CAPILLARY: 111 mg/dL — AB (ref 65–99)
GLUCOSE-CAPILLARY: 115 mg/dL — AB (ref 65–99)
GLUCOSE-CAPILLARY: 118 mg/dL — AB (ref 65–99)
GLUCOSE-CAPILLARY: 140 mg/dL — AB (ref 65–99)
GLUCOSE-CAPILLARY: 147 mg/dL — AB (ref 65–99)
Glucose-Capillary: 123 mg/dL — ABNORMAL HIGH (ref 65–99)

## 2016-11-27 LAB — SURGICAL PCR SCREEN
MRSA, PCR: NEGATIVE
Staphylococcus aureus: NEGATIVE

## 2016-11-27 LAB — MAGNESIUM: MAGNESIUM: 1.4 mg/dL — AB (ref 1.7–2.4)

## 2016-11-27 MED ORDER — ALBUMIN HUMAN 5 % IV SOLN
25.0000 g | Freq: Once | INTRAVENOUS | Status: AC
  Administered 2016-11-27: 25 g via INTRAVENOUS
  Filled 2016-11-27: qty 250

## 2016-11-27 NOTE — Progress Notes (Signed)
Initial Nutrition Assessment  DOCUMENTATION CODES:   Morbid obesity  INTERVENTION:   Monitor gi function and ability to initiate enteral nutrition support as appropriate.    NUTRITION DIAGNOSIS:   Increased nutrient needs related to wound healing as evidenced by estimated needs.  GOAL:   Provide needs based on ASPEN/SCCM guidelines  MONITOR:   Skin, I & O's, Vent status, Labs  REASON FOR ASSESSMENT:   Ventilator    ASSESSMENT:   Pt with hx of DM, CKD, colon cancer, and recurring abd wall seromas, admitted for surgical intervention now s/p removal of mesh from abd wall, incisional hernia repair 8/30, s/p exp lap and placement of open abd VAC for recurrent incisional hernia with SBO 9/1, s/p exp lap with closure of lateral abd fascia and reopening of midline fascia 9/2.    Plan for OR next 24-48 hrs for open abd.  NGT to LIWS  Discussed with MD and RN. No plans for TF today as pt had contusion to the small bowel mesentery.   Patient is currently intubated on ventilator support  Propofol: 17 ml/hr provides: 448 kcal per day  Medications reviewed and include: LR @ 75 ml/hr Labs reviewed: PO4: 2.4 (L), Magnesium 1.4 (L) CBG's: [phone] Nutrition-Focused physical exam completed. Findings are no fat depletion, no muscle depletion, and no edema.     Diet Order:    NPO  Skin:   (open abd with wound VAC)  Last BM:  8/29  Height:   Ht Readings from Last 1 Encounters:  11/23/16 5\' 4"  (1.626 m)    Weight:   Wt Readings from Last 1 Encounters:  11/23/16 258 lb (117 kg)    Ideal Body Weight:  54.5 kg  BMI:  Body mass index is 44.29 kg/m.  Estimated Nutritional Needs:   Kcal:  5170-0174  Protein:  136 grams  Fluid:  > 1.5 L/day  EDUCATION NEEDS:   No education needs identified at this time  [place], [place], [place] Pager [phone] After Hours Pager

## 2016-11-27 NOTE — Progress Notes (Signed)
PULMONARY / CRITICAL CARE MEDICINE   Name: Krystal Campbell MRN: [id number] DOB: 11-02-1960    ADMISSION DATE:  11/23/2016 CONSULTATION DATE:  11/25/16  REFERRING MD:  Dr Brantley Stage  CHIEF COMPLAINT:  Consult for assistance with mechanical ventilation post-op  HISTORY OF PRESENT ILLNESS:   At the time my examination the patient is intubated and sedated and thusly unable to provide history of present illness, review of systems, past medical history, family history, social history, home medications, or allergies. Histories are obtained from family, medical providers, with a medical record.  56 year old woman admitted after surgical repair for chronic seroma with removal of mesh and repair of incisional hernia. On postop day 1 this was complicated by recurrent incisional hernia with small bowel obstruction. She was taken to the operating room and had an exploratory laparotomy and placement of open it abdominal VAC. Postoperatively she was left intubated and is being transferred to the intensive care unit. I see was consult by the attending surgeon for assistance with management of mechanical ventilation and ICU care.  PAST MEDICAL HISTORY :  She  has a past medical history of Anemia; Anxiety; Arthritis; Cancer ([place]); Carpal tunnel syndrome of right wrist; Chronic kidney disease; Chronic pain syndrome; Depression; Diabetes mellitus without complication ([place]); Diverticulosis; GERD (gastroesophageal reflux disease); History of blood transfusion; History of hiatal hernia; Hyperlipidemia; Incisional hernia; Pneumonia; Pulmonary embolism ([place]); and Wears glasses.  PAST SURGICAL HISTORY: She  has a past surgical history that includes Replacement total knee; Abdominal hysterectomy; Kidney surgery; Joint replacement (4/15); Colonoscopy with propofol (Left, 10/30/2014); Esophagogastroduodenoscopy (egd) with propofol (Left, 10/30/2014); Laparoscopic partial colectomy (N/A, 11/04/2014); IVC FILTER PLACEMENT ([place] [facility]);  Appendectomy; Dilation and curettage of uterus; Portacath placement (Right, 12/22/2014); Incisional hernia repair (N/A, 01/17/2016); Insertion of mesh (N/A, 01/17/2016); Port-a-cath removal (Right, 03/07/2016); ir generic historical (06/19/2016); Incision and drainage abscess (N/A, 07/06/2016); IVC filter removed; Left knee arthroscopy; Incision and drainage abscess (N/A, 08/09/2016); IR Radiologist Eval & Mgmt (06/13/2016); IR Radiologist Eval & Mgmt (06/27/2016); Tubal ligation; Excision of mesh (N/A, 11/23/2016); Incisional hernia repair (N/A, 11/23/2016); laparotomy (N/A, 11/25/2016); and laparotomy (N/A, 11/26/2016).  No Known Allergies  No current facility-administered medications on file prior to encounter.    Current Outpatient Prescriptions on File Prior to Encounter  Medication Sig  . aspirin EC 81 MG tablet Take 81 mg by mouth daily.  . Melatonin 10 MG CAPS Take 20 mg by mouth at bedtime.   . pantoprazole (PROTONIX) 40 MG tablet Take 1 tablet (40 mg total) by mouth 2 (two) times daily.  Marland Kitchen ibuprofen (ADVIL,MOTRIN) 800 MG tablet Take 1 tablet (800 mg total) by mouth every 8 (eight) hours as needed. (Patient not taking: Reported on 11/16/2016)  . oxyCODONE (OXY IR/ROXICODONE) 5 MG immediate release tablet Take 1-2 tablets (5-10 mg total) by mouth every 6 (six) hours as needed for severe pain. (Patient not taking: Reported on 11/16/2016)  . vitamin B-12 1000 MCG tablet Take 1 tablet (1,000 mcg total) by mouth daily. (Patient not taking: Reported on 11/16/2016)    FAMILY HISTORY:  Her indicated that her mother is alive. She indicated that her father is alive. She indicated that all of her three sisters are alive. She indicated that the status of her other is unknown.    SOCIAL HISTORY: She  reports that she quit smoking about 48 years ago. Her smoking use included Cigarettes. She has a 5.00 pack-year smoking history. She has never used smokeless tobacco. She reports that she drinks alcohol. She reports  that she uses drugs, including Marijuana.   VITAL SIGNS: BP (!) 96/55   Pulse 72   Temp 99.7 F (37.6 C) (Axillary)   Resp (!) 24   Ht 5\' 4"  (1.626 m)   Wt 258 lb (117 kg)   SpO2 100%   BMI 44.29 kg/m   HEMODYNAMICS:    VENTILATOR SETTINGS: Vent Mode: PRVC FiO2 (%):  [40 %] 40 % Set Rate:  [12 bmp-24 bmp] 24 bmp Vt Set:  [480 mL] 480 mL PEEP:  [5 cmH20-8 cmH20] 8 cmH20 Plateau Pressure:  [16 cmH20-25 cmH20] 21 cmH20  INTAKE / OUTPUT: I/O last 3 completed shifts: In: 3798.6 [I.V.:2769.4; Other:350; IV Piggyback:679.2] Out: 2875 [Urine:1150; Emesis/NG output:240; Drains:1435; Blood:50]  PHYSICAL EXAMINATION: Blood pressure (!) 96/55, pulse 72, temperature 99.7 F (37.6 C), temperature source Axillary, resp. rate (!) 24, height 5\' 4"  (1.626 m), weight 258 lb (117 kg), SpO2 100 %. Gen:      No acute distress, obese HEENT:  EOMI, sclera anicteric, ETT Neck:     No masses; no thyromegaly Lungs:    Clear to auscultation bilaterally; normal respiratory effort CV:         Regular rate and rhythm; no murmurs Abd:      Wound vac Ext:    No edema; adequate peripheral perfusion Skin:      Warm and dry; no rash Neuro: Sedated, unresponsive   LABS:  BMET  Recent Labs Lab 11/25/16 0436 11/26/16 0152 11/27/16 0227  NA 137 136 138  K 4.5 4.4 4.1  CL 103 102 107  CO2 28 30 23   BUN 6 7 16   CREATININE 0.83 0.91 1.93*  GLUCOSE 180* 195* 118*    Electrolytes  Recent Labs Lab 11/25/16 0436 11/26/16 0152 11/27/16 0227  CALCIUM 8.7* 8.1* 7.8*  MG  --   --  1.4*  PHOS  --   --  2.4*    CBC  Recent Labs Lab 11/24/16 0436 11/26/16 0152 11/27/16 0227  WBC 9.6 10.2 7.1  HGB 11.5* 10.5* 9.5*  HCT 38.0 33.6* 30.9*  PLT 250 275 200    Coag's No results for input(s): APTT, INR in the last 168 hours.  Sepsis Markers No results for input(s): LATICACIDVEN, PROCALCITON, O2SATVEN in the last 168 hours.  ABG  Recent Labs Lab 11/26/16 0007 11/26/16 1720  11/27/16 0410  PHART 7.390 7.283* 7.456*  PCO2ART 44.0 51.4* 34.3  PO2ART 292* 119.0* 124*    Liver Enzymes  Recent Labs Lab 11/20/16 0916 11/24/16 0436 11/27/16 0227  AST 20 22  --   ALT 14 11*  --   ALKPHOS 83 68  --   BILITOT 0.4 0.7  --   ALBUMIN 3.4* 2.9* 2.2*    Cardiac Enzymes No results for input(s): TROPONINI, PROBNP in the last 168 hours.  Glucose  Recent Labs Lab 11/26/16 1208 11/26/16 1612 11/26/16 2013 11/27/16 0005 11/27/16 0327 11/27/16 0826  GLUCAP 111* 138* 126* 123* 111* 147*    Imaging Dg Chest Port 1 View  Result Date: 11/26/2016 CLINICAL DATA:  Acute respiratory failure with hypoxia EXAM: PORTABLE CHEST 1 VIEW COMPARISON:  11/26/2016 FINDINGS: Endotracheal tube and NG tube unchanged. Normal cardiac silhouette. Low lung volumes which is slightly improved. Mild central venous congestion. No pneumothorax. IMPRESSION: 1. Stable support apparatus. 2. Slight improvement in low lung volumes. Electronically Signed   By: Suzy Bouchard M.D.   On: 11/26/2016 16:29   Dg Chest Port 1 View  Result Date: 11/26/2016 CLINICAL DATA:  Acute respiratory failure, intubated EXAM: PORTABLE CHEST 1 VIEW COMPARISON:  Chest radiograph from one day prior. FINDINGS: Endotracheal tube tip is 2.2 cm above the carina. Enteric tube terminates in the body of the stomach. Stable cardiomediastinal silhouette with mild cardiomegaly. No pneumothorax. No pleural effusion. Low lung volumes. No overt pulmonary edema. Mild bibasilar atelectasis, stable. IMPRESSION: 1. Well-positioned support structures. 2. Low lung volumes with mild bibasilar atelectasis, stable. Electronically Signed   By: Ilona Sorrel M.D.   On: 11/26/2016 11:01     STUDIES:    CULTURES: None  ANTIBIOTICS: None  SIGNIFICANT EVENTS: 8/30: Removal of mesh 9/1: Exploratory laparotomy wound VAC 9/2: repeat OR   LINES/TUBES: 9/1: Intubated  DISCUSSION: 56 year old woman admitted to the ICU postoperatively  after urgent exploratory laparotomy for small bowel obstruction due to incisional hernia one day after having removal of abdominal mesh.   ASSESSMENT / PLAN:  PULMONARY A: Intubated for airway protection P:   Continue full vent support as she has open abdomen Deep sedation ABG reviewed Follow CXR  CARDIOVASCULAR A:  No active issues P:  Telemetry Hold home aspirin  RENAL A:   No active issues P:   Monitor BMP  GASTROINTESTINAL A:   Small bowel obstruction status post exploratory lap GI prophylaxis P:   Management of open abdomen per surgery Protonix IV NGT to LIS  HEMATOLOGIC A:   Normocytic anemia DVT prophylaxis P:  Follow CBC  INFECTIOUS A:   No active issue, Op-note reports no perforations and well-perfused small bowel P:   Cefotan for surgical prophylaxis  ENDOCRINE A:   DM   P:   SSI coverage  NEUROLOGIC A:   Pain and sedation P:   RASS goal: -1 Fentanyl, propofol   FAMILY  - Updates: No family at bedside - Inter-disciplinary family meet or Palliative Care meeting due by:  12/02/16  The patient is critically ill with multiple organ system failure and requires high complexity decision making for assessment and support, frequent evaluation and titration of therapies, advanced monitoring, review of radiographic studies and interpretation of complex data.   Critical Care Time devoted to patient care services, exclusive of separately billable procedures, described in this note is 35 minutes.   Marshell Garfinkel MD [facility] Pager [phone] If no answer or after 3pm call: [phone] 11/27/2016, 8:50 AM

## 2016-11-27 NOTE — Progress Notes (Signed)
1 Day Post-Op   Subjective/Chief Complaint: On vent   Objective: Vital signs in last 24 hours: Temp:  [97.7 F (36.5 C)-99.7 F (37.6 C)] 99.7 F (37.6 C) (09/03 0800) Pulse Rate:  [72-101] 73 (09/03 0900) Resp:  [11-24] 24 (09/03 0900) BP: (86-112)/(42-72) 100/51 (09/03 0900) SpO2:  [98 %-100 %] 99 % (09/03 0900) FiO2 (%):  [40 %] 40 % (09/03 0900) Last BM Date: 11/22/16  Intake/Output from previous day: 09/02 0701 - 09/03 0700 In: 2947.8 [I.V.:2268.6; IV Piggyback:679.2] Out: 1615 [Urine:540; Emesis/NG output:90; Drains:935; Blood:50] Intake/Output this shift: Total I/O In: 346.5 [I.V.:346.5] Out: 55 [Urine:55]  General appearance: no distress Resp: clear to auscultation bilaterally Cardio: regular rate and rhythm GI: open abdomen VAC in place Extremities: mild edema  Lab Results:   Recent Labs  11/26/16 0152 11/27/16 0227  WBC 10.2 7.1  HGB 10.5* 9.5*  HCT 33.6* 30.9*  PLT 275 200   BMET  Recent Labs  11/26/16 0152 11/27/16 0227  NA 136 138  K 4.4 4.1  CL 102 107  CO2 30 23  GLUCOSE 195* 118*  BUN 7 16  CREATININE 0.91 1.93*  CALCIUM 8.1* 7.8*   PT/INR No results for input(s): LABPROT, INR in the last 72 hours. ABG  Recent Labs  11/26/16 1720 11/27/16 0410  PHART 7.283* 7.456*  HCO3 24.1 23.8    Studies/Results: Ct Abdomen Pelvis W Contrast  Result Date: 11/25/2016 CLINICAL DATA:  56 year old female with left abdominal pain and swelling following abdominal wall seroma excision, hernia mesh removal and repair 2 days ago. EXAM: CT ABDOMEN AND PELVIS WITH CONTRAST TECHNIQUE: Multidetector CT imaging of the abdomen and pelvis was performed using the standard protocol following bolus administration of intravenous contrast. CONTRAST:  153mL ISOVUE-300 IOPAMIDOL (ISOVUE-300) INJECTION 61% COMPARISON:  10/17/2016 and prior CTs FINDINGS: Lower chest: Mild bibasilar atelectasis noted. Hepatobiliary: The liver and gallbladder are unremarkable. No  biliary dilatation. Pancreas: Unremarkable Spleen: Unremarkable except for calcified granulomas Adrenals/Urinary Tract: Atrophic right kidney again noted. The left kidney, adrenal glands and bladder are unremarkable. Stomach/Bowel: A large left paramedian abdominal wall hernia is noted containing multiple dilated small bowel loops. Distension of small bowel loops proximal to the hernia noted with collapsed small bowel loops distal to the hernia. There is no evidence of pneumoperitoneum or focal abscess. A percutaneous drain is present within the hernia. Vascular/Lymphatic: No significant vascular findings are present. The visualized mesenteric vessels appear unremarkable. No enlarged abdominal or pelvic lymph nodes. Reproductive: Bilateral tubal ligation clips again noted. No other significant abnormality Other: No ascites. Musculoskeletal: No acute abnormality. IMPRESSION: 1. Large left paramedian abdominal wall hernia containing multiple dilated loops of small bowel, compatible with small bowel obstruction. No evidence of pneumoperitoneum or abscess. 2. Mild bibasilar atelectasis 3. These results will be called to the ordering clinician or representative by the Radiologist Assistant, and communication documented in the PACS or zVision Dashboard. Electronically Signed   By: Margarette Canada M.D.   On: 11/25/2016 14:33   Dg Chest Port 1 View  Result Date: 11/26/2016 CLINICAL DATA:  Acute respiratory failure with hypoxia EXAM: PORTABLE CHEST 1 VIEW COMPARISON:  11/26/2016 FINDINGS: Endotracheal tube and NG tube unchanged. Normal cardiac silhouette. Low lung volumes which is slightly improved. Mild central venous congestion. No pneumothorax. IMPRESSION: 1. Stable support apparatus. 2. Slight improvement in low lung volumes. Electronically Signed   By: Suzy Bouchard M.D.   On: 11/26/2016 16:29   Dg Chest Port 1 View  Result Date: 11/26/2016 CLINICAL  DATA:  Acute respiratory failure, intubated EXAM: PORTABLE CHEST 1  VIEW COMPARISON:  Chest radiograph from one day prior. FINDINGS: Endotracheal tube tip is 2.2 cm above the carina. Enteric tube terminates in the body of the stomach. Stable cardiomediastinal silhouette with mild cardiomegaly. No pneumothorax. No pleural effusion. Low lung volumes. No overt pulmonary edema. Mild bibasilar atelectasis, stable. IMPRESSION: 1. Well-positioned support structures. 2. Low lung volumes with mild bibasilar atelectasis, stable. Electronically Signed   By: Ilona Sorrel M.D.   On: 11/26/2016 11:01   Dg Chest Port 1 View  Result Date: 11/25/2016 CLINICAL DATA:  OG tube placement and attempt at central venous catheter placement. EXAM: PORTABLE CHEST 1 VIEW COMPARISON:  None. FINDINGS: An endotracheal tube is identified with tip at the carina. An OG tube is present with tip overlying the proximal- mid stomach. This is a low volume film with bibasilar atelectasis. No evidence of pleural effusion or pneumothorax. IMPRESSION: Endotracheal tube with tip at the carina -recommend 2 cm retraction. Diane, nurse for this patient was notified on 11/25/2016 at 7:35 pm. OG tube with tip overlying the proximal - mid stomach. Low volume film with bibasilar atelectasis. Electronically Signed   By: Margarette Canada M.D.   On: 11/25/2016 19:35    Anti-infectives: Anti-infectives    Start     Dose/Rate Route Frequency Ordered Stop   11/26/16 0900  cefoTEtan (CEFOTAN) 2 g in dextrose 5 % 50 mL IVPB     2 g 100 mL/hr over 30 Minutes Intravenous On call to O.R. 11/26/16 0848 11/26/16 1020   11/25/16 1545  ceFAZolin (ANCEF) IVPB 2g/100 mL premix     2 g 200 mL/hr over 30 Minutes Intravenous  Once 11/25/16 1539 11/25/16 1708   11/23/16 1730  ceFAZolin (ANCEF) IVPB 2g/100 mL premix     2 g 200 mL/hr over 30 Minutes Intravenous Every 8 hours 11/23/16 1558 11/23/16 1722   11/23/16 0600  ceFAZolin (ANCEF) 3 g in dextrose 5 % 50 mL IVPB     3 g 130 mL/hr over 30 Minutes Intravenous On call to O.R. 11/22/16 1331  11/23/16 0945      Assessment/Plan: s/p Procedure(s): EXPLORATORY LAPAROTOMY, CLOSURE OF LATERAL ABDOMINAL FASCIA  AND REOPENING OF MIDLINE FASCIA (N/A) POD #s 4/2/1 Open abdomen - open abdomen VAC via midline fascial defect, L lateral defect closed 9/2. Back to OR with Dr. Brantley Stage next 24-48h. NGT to LIWS. Vent dependent acute hypoxic resp failure - appreciate CCM management. No weaning with open abdomen VTE - SQ heparin, hold in AM for possible OR AKI - volume resuscitate DIspo - ICU   LOS: 4 days    [place] Fritze E 11/27/2016

## 2016-11-28 ENCOUNTER — Inpatient Hospital Stay (HOSPITAL_COMMUNITY): Payer: Medicare PPO

## 2016-11-28 DIAGNOSIS — J9601 Acute respiratory failure with hypoxia: Secondary | ICD-10-CM

## 2016-11-28 DIAGNOSIS — E873 Alkalosis: Secondary | ICD-10-CM

## 2016-11-28 DIAGNOSIS — I959 Hypotension, unspecified: Secondary | ICD-10-CM

## 2016-11-28 DIAGNOSIS — G9340 Encephalopathy, unspecified: Secondary | ICD-10-CM

## 2016-11-28 LAB — GLUCOSE, CAPILLARY
GLUCOSE-CAPILLARY: 110 mg/dL — AB (ref 65–99)
GLUCOSE-CAPILLARY: 117 mg/dL — AB (ref 65–99)
GLUCOSE-CAPILLARY: 126 mg/dL — AB (ref 65–99)
GLUCOSE-CAPILLARY: 90 mg/dL (ref 65–99)
GLUCOSE-CAPILLARY: 95 mg/dL (ref 65–99)
Glucose-Capillary: 78 mg/dL (ref 65–99)

## 2016-11-28 LAB — CBC
HEMATOCRIT: 28.3 % — AB (ref 36.0–46.0)
HEMOGLOBIN: 8.8 g/dL — AB (ref 12.0–15.0)
MCH: 28.6 pg (ref 26.0–34.0)
MCHC: 31.1 g/dL (ref 30.0–36.0)
MCV: 91.9 fL (ref 78.0–100.0)
Platelets: 203 10*3/uL (ref 150–400)
RBC: 3.08 MIL/uL — AB (ref 3.87–5.11)
RDW: 15.2 % (ref 11.5–15.5)
WBC: 8 10*3/uL (ref 4.0–10.5)

## 2016-11-28 LAB — BASIC METABOLIC PANEL
Anion gap: 7 (ref 5–15)
BUN: 11 mg/dL (ref 6–20)
CHLORIDE: 108 mmol/L (ref 101–111)
CO2: 22 mmol/L (ref 22–32)
CREATININE: 0.96 mg/dL (ref 0.44–1.00)
Calcium: 7.9 mg/dL — ABNORMAL LOW (ref 8.9–10.3)
GFR calc Af Amer: >60 mL/min (ref >60)
GFR calc non Af Amer: >60 mL/min (ref >60)
Glucose, Bld: 118 mg/dL — ABNORMAL HIGH (ref 65–99)
POTASSIUM: 3.7 mmol/L (ref 3.5–5.1)
Sodium: 137 mmol/L (ref 135–145)

## 2016-11-28 LAB — MAGNESIUM: MAGNESIUM: 1.6 mg/dL — AB (ref 1.7–2.4)

## 2016-11-28 MED ORDER — SODIUM CHLORIDE 0.9% FLUSH
10.0000 mL | Freq: Two times a day (BID) | INTRAVENOUS | Status: DC
  Administered 2016-11-28: 10 mL
  Administered 2016-11-28 – 2016-11-29 (×2): 20 mL
  Administered 2016-11-29 – 2016-12-06 (×13): 10 mL

## 2016-11-28 MED ORDER — CHLORHEXIDINE GLUCONATE CLOTH 2 % EX PADS
6.0000 | MEDICATED_PAD | Freq: Once | CUTANEOUS | Status: AC
  Administered 2016-11-28: 6 via TOPICAL

## 2016-11-28 MED ORDER — CHLORHEXIDINE GLUCONATE CLOTH 2 % EX PADS
6.0000 | MEDICATED_PAD | Freq: Every day | CUTANEOUS | Status: DC
  Administered 2016-11-29 – 2016-12-01 (×3): 6 via TOPICAL

## 2016-11-28 MED ORDER — SODIUM CHLORIDE 0.9% FLUSH
10.0000 mL | INTRAVENOUS | Status: DC | PRN
  Administered 2016-12-05 (×2): 10 mL
  Filled 2016-11-28 (×2): qty 40

## 2016-11-28 MED ORDER — MAGNESIUM SULFATE 2 GM/50ML IV SOLN
2.0000 g | Freq: Once | INTRAVENOUS | Status: AC
Start: 2016-11-28 — End: 2016-11-28
  Administered 2016-11-28: 2 g via INTRAVENOUS
  Filled 2016-11-28: qty 50

## 2016-11-28 MED ORDER — CHLORHEXIDINE GLUCONATE CLOTH 2 % EX PADS
6.0000 | MEDICATED_PAD | Freq: Every day | CUTANEOUS | Status: DC
  Administered 2016-11-28: 6 via TOPICAL

## 2016-11-28 NOTE — Progress Notes (Signed)
2 Days Post-Op   Subjective/Chief Complaint: Events noted  On vent comfortable     Objective: Vital signs in last 24 hours: Temp:  [98 F (36.7 C)-99.1 F (37.3 C)] 99.1 F (37.3 C) (09/04 0800) Pulse Rate:  [58-77] 60 (09/04 1100) Resp:  [17-24] 24 (09/04 1100) BP: (86-118)/(39-66) 106/55 (09/04 1100) SpO2:  [98 %-100 %] 99 % (09/04 1100) FiO2 (%):  [30 %-40 %] 30 % (09/04 0813) Last BM Date: 11/22/16  Intake/Output from previous day: 09/03 0701 - 09/04 0700 In: 2908.2 [I.V.:2908.2] Out: 1500 [Urine:815; Drains:685] Intake/Output this shift: Total I/O In: 467.3 [I.V.:467.3] Out: 125 [Urine:125]  Incision/Wound:vac in place  Good seal serous  Lab Results:   Recent Labs  11/27/16 0227 11/28/16 0955  WBC 7.1 8.0  HGB 9.5* 8.8*  HCT 30.9* 28.3*  PLT 200 203   BMET  Recent Labs  11/27/16 0227 11/28/16 0955  NA 138 137  K 4.1 3.7  CL 107 108  CO2 23 22  GLUCOSE 118* 118*  BUN 16 11  CREATININE 1.93* 0.96  CALCIUM 7.8* 7.9*   PT/INR No results for input(s): LABPROT, INR in the last 72 hours. ABG  Recent Labs  11/26/16 1720 11/27/16 0410  PHART 7.283* 7.456*  HCO3 24.1 23.8    Studies/Results: Dg Chest Port 1 View  Result Date: 11/28/2016 CLINICAL DATA:  Acute respiratory failure, intubated patient. History of colonic malignancy. EXAM: PORTABLE CHEST 1 VIEW COMPARISON:  Portable chest x-ray of November 26, 2016 FINDINGS: The lung volumes are mildly decreased. There is persistent left basilar atelectasis. Probable minimal subsegmental atelectasis in the right infrahilar region present. The endotracheal tube tip projects approximately 3 cm above the carina. The esophagogastric tube tip and proximal port project below the inferior margin of the image. IMPRESSION: Fairly stable appearance of the chest. Persistent bibasilar atelectasis greatest on the left. The support tubes are in reasonable position. Electronically Signed   By: David  Martinique M.D.   On:  11/28/2016 07:23   Dg Chest Port 1 View  Result Date: 11/26/2016 CLINICAL DATA:  Acute respiratory failure with hypoxia EXAM: PORTABLE CHEST 1 VIEW COMPARISON:  11/26/2016 FINDINGS: Endotracheal tube and NG tube unchanged. Normal cardiac silhouette. Low lung volumes which is slightly improved. Mild central venous congestion. No pneumothorax. IMPRESSION: 1. Stable support apparatus. 2. Slight improvement in low lung volumes. Electronically Signed   By: Suzy Bouchard M.D.   On: 11/26/2016 16:29    Anti-infectives: Anti-infectives    Start     Dose/Rate Route Frequency Ordered Stop   11/26/16 0900  cefoTEtan (CEFOTAN) 2 g in dextrose 5 % 50 mL IVPB     2 g 100 mL/hr over 30 Minutes Intravenous On call to O.R. 11/26/16 0848 11/26/16 1020   11/25/16 1545  ceFAZolin (ANCEF) IVPB 2g/100 mL premix     2 g 200 mL/hr over 30 Minutes Intravenous  Once 11/25/16 1539 11/25/16 1708   11/23/16 1730  ceFAZolin (ANCEF) IVPB 2g/100 mL premix     2 g 200 mL/hr over 30 Minutes Intravenous Every 8 hours 11/23/16 1558 11/23/16 1722   11/23/16 0600  ceFAZolin (ANCEF) 3 g in dextrose 5 % 50 mL IVPB     3 g 130 mL/hr over 30 Minutes Intravenous On call to O.R. 11/22/16 1331 11/23/16 0945      Assessment/Plan: s/p Procedure(s): EXPLORATORY LAPAROTOMY, CLOSURE OF LATERAL ABDOMINAL FASCIA  AND REOPENING OF MIDLINE FASCIA (N/A) OR wed for closure hopefully Will need a biologic agent to  accomplish this  Discussed with daughter  High risk of hernia recurrence  The procedure has been discussed with the Daughter. .  Alternative therapies have been discussed with the patient.  Operative risks include bleeding,  Infection,  Organ injury,  Nerve injury,  Blood vessel injury,  DVT,  Pulmonary embolism,  Death,  And possible reoperation.  Medical management risks include worsening of present situation.  The success of the procedure is 50 -90 %  Of closure .  The daughter  understands and agrees to proceed.  LOS: 5 days     Chandy Tarman A. 11/28/2016

## 2016-11-28 NOTE — Progress Notes (Signed)
Nutrition Follow-up  DOCUMENTATION CODES:   Morbid obesity  INTERVENTION:   Recommend trickle feedings Vital High Protein @ 20 ml/hr   NUTRITION DIAGNOSIS:   Increased nutrient needs related to wound healing as evidenced by estimated needs. Ongoing.   GOAL:   Provide needs based on ASPEN/SCCM guidelines Not yet met.   MONITOR:   Skin, I & O's, Vent status, Labs  ASSESSMENT:   Pt with hx of DM, CKD, colon cancer, and recurring abd wall seromas, admitted for surgical intervention now s/p removal of mesh from abd wall, incisional hernia repair 8/30, s/p exp lap and placement of open abd VAC for recurrent incisional hernia with SBO 9/1, s/p exp lap with closure of lateral abd fascia and reopening of midline fascia 9/2.   Pt discussed during ICU rounds and with RN.  Plan for OR next 24-48 hrs for open abd.  NGT to LIWS  Pt discussed during ICU rounds and with RN.  No TF yet, pt had contusion to the small bowel mesentery.   Patient is currently intubated on ventilator support  Propofol: 17.6 ml/hr provides: 464 kcal per day Medications reviewed and include: 2 g magnesium sulfate x 1 IVF: LR @ 75 ml/hr Labs reviewed: magnesium 1.6 (L)  Diet Order:    NPO  Skin:   (open abd with wound VAC)  Last BM:  8/29  Height:   Ht Readings from Last 1 Encounters:  11/23/16 _0  (1.626 m)    Weight:   Wt Readings from Last 1 Encounters:  11/23/16 258 lb (117 kg)    Ideal Body Weight:  54.5 kg  BMI:  Body mass index is 44.29 kg/m.  Estimated Nutritional Needs:   Kcal:  7519-8242  Protein:  136 grams  Fluid:  > 1.5 L/day  EDUCATION NEEDS:   No education needs identified at this time  [address], [place], [place] Pager [phone] After Hours Pager

## 2016-11-28 NOTE — Progress Notes (Signed)
Peripherally Inserted Central Catheter/Midline Placement  The IV Nurse has discussed with the patient and/or persons authorized to consent for the patient, the purpose of this procedure and the potential benefits and risks involved with this procedure.  The benefits include less needle sticks, lab draws from the catheter, and the patient may be discharged home with the catheter. Risks include, but not limited to, infection, bleeding, blood clot (thrombus formation), and puncture of an artery; nerve damage and irregular heartbeat and possibility to perform a PICC exchange if needed/ordered by physician.  Alternatives to this procedure were also discussed.  Bard Power PICC patient education guide, fact sheet on infection prevention and patient information card has been provided to patient /or left at bedside.  Telephone consent signed by daughter due to altered mental status.   PICC/Midline Placement Documentation        Lonnie Rosado, Nicolette Bang 11/28/2016, 1:03 PM

## 2016-11-28 NOTE — Progress Notes (Signed)
PULMONARY / CRITICAL CARE MEDICINE   Name: Krystal Campbell MRN: [id number] DOB: [birth year]    ADMISSION DATE:  11/23/2016 CONSULTATION DATE:  11/25/16  REFERRING MD:  Dr Brantley Stage  CHIEF COMPLAINT:  Consult for assistance with mechanical ventilation post-op  HISTORY OF PRESENT ILLNESS:   At the time my examination the patient is intubated and sedated and thusly unable to provide history of present illness, review of systems, past medical history, family history, social history, home medications, or allergies. Histories are obtained from family, medical providers, with a medical record.  56 year old woman admitted after surgical repair for chronic seroma with removal of mesh and repair of incisional hernia. On postop day 1 this was complicated by recurrent incisional hernia with small bowel obstruction. She was taken to the operating room and had an exploratory laparotomy and placement of open it abdominal VAC. Postoperatively she was left intubated and is being transferred to the intensive care unit. I see was consult by the attending surgeon for assistance with management of mechanical ventilation and ICU care.  VITAL SIGNS: BP (!) 106/55   Pulse 60   Temp 99.1 F (37.3 C)   Resp (!) 24   Ht 5\' 4"  (1.626 m)   Wt 117 kg (258 lb)   SpO2 99%   BMI 44.29 kg/m   HEMODYNAMICS:    VENTILATOR SETTINGS: Vent Mode: PRVC FiO2 (%):  [30 %-40 %] 30 % Set Rate:  [24 bmp] 24 bmp Vt Set:  [480 mL] 480 mL PEEP:  [8 cmH20] 8 cmH20 Plateau Pressure:  [22 cmH20-26 cmH20] 23 cmH20  INTAKE / OUTPUT: I/O last 3 completed shifts: In: 4494.2 [I.V.:3994.2; IV Piggyback:500] Out: 2515 [Urine:1190; Emesis/NG output:80; Drains:1245]  PHYSICAL EXAMINATION: Gen:      No acute distress, obese, completely sedate HEENT:  [place]/AT, PERRL, EOM-I and MMM Neck:      Supple, no masses Lungs:    CTA bilaterally CV:         RRR, Nl S1/S2, -M/R/G. Abd:      Wound vac, soft, tender to palpation Ext:    -edema and  -tenderness Skin:       Warm and dry; no rash Neuro:    Sedated, unresponsive  LABS:  BMET  Recent Labs Lab 11/26/16 0152 11/27/16 0227 11/28/16 0955  NA 136 138 137  K 4.4 4.1 3.7  CL 102 107 108  CO2 30 23 22   BUN 7 16 11   CREATININE 0.91 1.93* 0.96  GLUCOSE 195* 118* 118*   Electrolytes  Recent Labs Lab 11/26/16 0152 11/27/16 0227 11/28/16 0955  CALCIUM 8.1* 7.8* 7.9*  MG  --  1.4*  --   PHOS  --  2.4*  --    CBC  Recent Labs Lab 11/26/16 0152 11/27/16 0227 11/28/16 0955  WBC 10.2 7.1 8.0  HGB 10.5* 9.5* 8.8*  HCT 33.6* 30.9* 28.3*  PLT 275 200 203   Coag's No results for input(s): APTT, INR in the last 168 hours.  Sepsis Markers No results for input(s): LATICACIDVEN, PROCALCITON, O2SATVEN in the last 168 hours.  ABG  Recent Labs Lab 11/26/16 0007 11/26/16 1720 11/27/16 0410  PHART 7.390 7.283* 7.456*  PCO2ART 44.0 51.4* 34.3  PO2ART 292* 119.0* 124*    Liver Enzymes  Recent Labs Lab 11/24/16 0436 11/27/16 0227  AST 22  --   ALT 11*  --   ALKPHOS 68  --   BILITOT 0.7  --   ALBUMIN 2.9* 2.2*   Cardiac Enzymes No  results for input(s): TROPONINI, PROBNP in the last 168 hours.  Glucose  Recent Labs Lab 11/27/16 1224 11/27/16 1656 11/27/16 2016 11/27/16 2341 11/28/16 0404 11/28/16 0907  GLUCAP 118* 115* 106* 140* 95 126*   Imaging Dg Chest Port 1 View  Result Date: 11/28/2016 CLINICAL DATA:  Acute respiratory failure, intubated patient. History of colonic malignancy. EXAM: PORTABLE CHEST 1 VIEW COMPARISON:  Portable chest x-ray of November 26, 2016 FINDINGS: The lung volumes are mildly decreased. There is persistent left basilar atelectasis. Probable minimal subsegmental atelectasis in the right infrahilar region present. The endotracheal tube tip projects approximately 3 cm above the carina. The esophagogastric tube tip and proximal port project below the inferior margin of the image. IMPRESSION: Fairly stable appearance of the  chest. Persistent bibasilar atelectasis greatest on the left. The support tubes are in reasonable position. Electronically Signed   By: David  Martinique M.D.   On: 11/28/2016 07:23   STUDIES:    CULTURES: None  ANTIBIOTICS: None  SIGNIFICANT EVENTS: 8/30: Removal of mesh 9/1: Exploratory laparotomy wound VAC 9/2: repeat OR   LINES/TUBES: 9/1: Intubated  DISCUSSION: 56 year old woman admitted to the ICU postoperatively after urgent exploratory laparotomy for small bowel obstruction due to incisional hernia one day after having removal of abdominal mesh.   ASSESSMENT / PLAN:  PULMONARY A: Intubated for airway protection P:   Continue full vent support as she has open abdomen and going in for closure in AM per CCS Deep sedation Follow CXR and ABG in AM Decrease RR to 20 and PEEP to 5  CARDIOVASCULAR A:  No active issues P:  Telemetry Hold home aspirin Watch BP with sedation  RENAL A:   No active issues P:   Mg now BMET in AM Replace electrolytes as indicated  GASTROINTESTINAL A:   Small bowel obstruction status post exploratory lap GI prophylaxis P:   Management of open abdomen per surgery, closure in AM Protonix IV NGT to LIS TPN after placement of PICC line  HEMATOLOGIC A:   Normocytic anemia DVT prophylaxis P:  Follow CBC Transfuse per ICU protocol  INFECTIOUS A:   No active issue, Op-note reports no perforations and well-perfused small bowel P:   Cefotan for surgical prophylaxis  ENDOCRINE A:   DM   P:   SSI coverage  NEUROLOGIC A:   Pain and sedation P:   RASS goal: -1 Fentanyl, propofol  FAMILY  - Updates: No family at bedside - Inter-disciplinary family meet or Palliative Care meeting due by:  12/02/16  The patient is critically ill with multiple organ system failure and requires high complexity decision making for assessment and support, frequent evaluation and titration of therapies, advanced monitoring, review of radiographic  studies and interpretation of complex data.   Critical Care Time devoted to patient care services, exclusive of separately billable procedures, described in this note is 35 minutes.   Marshell Garfinkel MD [facility] Pager [phone] If no answer or after 3pm call: [phone] 11/28/2016, 11:33 AM

## 2016-11-29 ENCOUNTER — Inpatient Hospital Stay (HOSPITAL_COMMUNITY): Payer: Medicare PPO

## 2016-11-29 ENCOUNTER — Inpatient Hospital Stay (HOSPITAL_COMMUNITY): Payer: Medicare PPO | Admitting: Certified Registered Nurse Anesthetist

## 2016-11-29 ENCOUNTER — Encounter (HOSPITAL_COMMUNITY)
Admission: RE | Disposition: A | Discharge status: Skilled Nursing Facility | Payer: Self-pay | Source: Ambulatory Visit | Attending: Surgery

## 2016-11-29 HISTORY — PX: LAPAROTOMY: SHX154

## 2016-11-29 HISTORY — PX: INSERTION OF MESH: SHX5868

## 2016-11-29 HISTORY — PX: APPLICATION OF WOUND VAC: SHX5189

## 2016-11-29 LAB — CBC
HCT: 27.7 % — ABNORMAL LOW (ref 36.0–46.0)
Hemoglobin: 8.6 g/dL — ABNORMAL LOW (ref 12.0–15.0)
MCH: 28.6 pg (ref 26.0–34.0)
MCHC: 31 g/dL (ref 30.0–36.0)
MCV: 92 fL (ref 78.0–100.0)
PLATELETS: 222 10*3/uL (ref 150–400)
RBC: 3.01 MIL/uL — ABNORMAL LOW (ref 3.87–5.11)
RDW: 15.3 % (ref 11.5–15.5)
WBC: 8 10*3/uL (ref 4.0–10.5)

## 2016-11-29 LAB — POCT I-STAT 3, ART BLOOD GAS (G3+)
ACID-BASE DEFICIT: 3 mmol/L — AB (ref 0.0–2.0)
Bicarbonate: 21.3 mmol/L (ref 20.0–28.0)
O2 SAT: 94 %
PCO2 ART: 35.7 mmHg (ref 32.0–48.0)
TCO2: 22 mmol/L (ref 22–32)
pH, Arterial: 7.384 (ref 7.350–7.450)
pO2, Arterial: 73 mmHg — ABNORMAL LOW (ref 83.0–108.0)

## 2016-11-29 LAB — MAGNESIUM: MAGNESIUM: 1.9 mg/dL (ref 1.7–2.4)

## 2016-11-29 LAB — BASIC METABOLIC PANEL
Anion gap: 9 (ref 5–15)
BUN: 9 mg/dL (ref 6–20)
CALCIUM: 7.9 mg/dL — AB (ref 8.9–10.3)
CO2: 23 mmol/L (ref 22–32)
Chloride: 106 mmol/L (ref 101–111)
Creatinine, Ser: 0.91 mg/dL (ref 0.44–1.00)
GFR calc non Af Amer: >60 mL/min (ref >60)
Glucose, Bld: 124 mg/dL — ABNORMAL HIGH (ref 65–99)
Potassium: 3.5 mmol/L (ref 3.5–5.1)
Sodium: 138 mmol/L (ref 135–145)

## 2016-11-29 LAB — GLUCOSE, CAPILLARY
GLUCOSE-CAPILLARY: 108 mg/dL — AB (ref 65–99)
GLUCOSE-CAPILLARY: 117 mg/dL — AB (ref 65–99)
GLUCOSE-CAPILLARY: 117 mg/dL — AB (ref 65–99)
GLUCOSE-CAPILLARY: 97 mg/dL (ref 65–99)
Glucose-Capillary: 109 mg/dL — ABNORMAL HIGH (ref 65–99)
Glucose-Capillary: 111 mg/dL — ABNORMAL HIGH (ref 65–99)
Glucose-Capillary: 126 mg/dL — ABNORMAL HIGH (ref 65–99)

## 2016-11-29 LAB — TRIGLYCERIDES: TRIGLYCERIDES: 216 mg/dL — AB (ref <150)

## 2016-11-29 LAB — PHOSPHORUS: Phosphorus: 2.8 mg/dL (ref 2.5–4.6)

## 2016-11-29 SURGERY — LAPAROTOMY, EXPLORATORY
Anesthesia: General | Site: Abdomen

## 2016-11-29 MED ORDER — ALBUTEROL SULFATE HFA 108 (90 BASE) MCG/ACT IN AERS
INHALATION_SPRAY | RESPIRATORY_TRACT | Status: AC
  Filled 2016-11-29: qty 6.7

## 2016-11-29 MED ORDER — MIDAZOLAM HCL 2 MG/2ML IJ SOLN
INTRAMUSCULAR | Status: AC
  Filled 2016-11-29: qty 2

## 2016-11-29 MED ORDER — CEFAZOLIN SODIUM-DEXTROSE 2-3 GM-% IV SOLR
INTRAVENOUS | Status: DC | PRN
  Administered 2016-11-29: 2 g via INTRAVENOUS

## 2016-11-29 MED ORDER — DEXTROSE 5 % IV SOLN
30.0000 mmol | Freq: Once | INTRAVENOUS | Status: AC
  Administered 2016-11-29: 30 mmol via INTRAVENOUS
  Filled 2016-11-29: qty 10

## 2016-11-29 MED ORDER — ROCURONIUM BROMIDE 100 MG/10ML IV SOLN
INTRAVENOUS | Status: DC | PRN
  Administered 2016-11-29: 100 mg via INTRAVENOUS

## 2016-11-29 MED ORDER — MAGNESIUM SULFATE 2 GM/50ML IV SOLN
2.0000 g | Freq: Once | INTRAVENOUS | Status: AC
  Administered 2016-11-29: 2 g via INTRAVENOUS
  Filled 2016-11-29: qty 50

## 2016-11-29 MED ORDER — LACTATED RINGERS IV SOLN
INTRAVENOUS | Status: DC | PRN
  Administered 2016-11-29: 14:00:00 via INTRAVENOUS

## 2016-11-29 MED ORDER — 0.9 % SODIUM CHLORIDE (POUR BTL) OPTIME
TOPICAL | Status: DC | PRN
  Administered 2016-11-29 (×3): 1000 mL

## 2016-11-29 MED ORDER — FENTANYL CITRATE (PF) 250 MCG/5ML IJ SOLN
INTRAMUSCULAR | Status: AC
  Filled 2016-11-29: qty 5

## 2016-11-29 MED ORDER — PROPOFOL 10 MG/ML IV BOLUS
INTRAVENOUS | Status: AC
  Filled 2016-11-29: qty 20

## 2016-11-29 MED ORDER — CEFAZOLIN SODIUM-DEXTROSE 2-4 GM/100ML-% IV SOLN: 2.0000 g | INTRAVENOUS | Status: DC

## 2016-11-29 SURGICAL SUPPLY — 46 items
BLADE CLIPPER SURG (BLADE) IMPLANT
CANISTER SUCT 3000ML PPV (MISCELLANEOUS) ×4 IMPLANT
CANISTER WOUND CARE 500ML ATS (WOUND CARE) ×4 IMPLANT
CHLORAPREP W/TINT 26ML (MISCELLANEOUS) IMPLANT
COVER SURGICAL LIGHT HANDLE (MISCELLANEOUS) ×8 IMPLANT
DRAPE LAPAROSCOPIC ABDOMINAL (DRAPES) ×4 IMPLANT
DRAPE WARM FLUID 44X44 (DRAPE) ×4 IMPLANT
DRSG OPSITE POSTOP 4X10 (GAUZE/BANDAGES/DRESSINGS) IMPLANT
DRSG OPSITE POSTOP 4X8 (GAUZE/BANDAGES/DRESSINGS) IMPLANT
DRSG VAC ATS LRG SENSATRAC (GAUZE/BANDAGES/DRESSINGS) ×4 IMPLANT
DRSG VAC ATS MED SENSATRAC (GAUZE/BANDAGES/DRESSINGS) ×4 IMPLANT
ELECT BLADE 6.5 EXT (BLADE) IMPLANT
ELECT CAUTERY BLADE 6.4 (BLADE) ×4 IMPLANT
ELECT REM PT RETURN 9FT ADLT (ELECTROSURGICAL) ×4
ELECTRODE REM PT RTRN 9FT ADLT (ELECTROSURGICAL) ×2 IMPLANT
GLOVE BIO SURGEON STRL SZ8 (GLOVE) ×4 IMPLANT
GLOVE BIOGEL PI IND STRL 8 (GLOVE) ×2 IMPLANT
GLOVE BIOGEL PI IND STRL 8.5 (GLOVE) ×4 IMPLANT
GLOVE BIOGEL PI INDICATOR 8 (GLOVE) ×2
GLOVE BIOGEL PI INDICATOR 8.5 (GLOVE) ×4
GLOVE ECLIPSE 8.0 STRL XLNG CF (GLOVE) ×4 IMPLANT
GOWN STRL REUS W/ TWL LRG LVL3 (GOWN DISPOSABLE) IMPLANT
GOWN STRL REUS W/ TWL XL LVL3 (GOWN DISPOSABLE) ×6 IMPLANT
GOWN STRL REUS W/TWL LRG LVL3 (GOWN DISPOSABLE)
GOWN STRL REUS W/TWL XL LVL3 (GOWN DISPOSABLE) ×6
KIT BASIN OR (CUSTOM PROCEDURE TRAY) ×4 IMPLANT
KIT ROOM TURNOVER OR (KITS) ×4 IMPLANT
LIGASURE IMPACT 36 18CM CVD LR (INSTRUMENTS) IMPLANT
NS IRRIG 1000ML POUR BTL (IV SOLUTION) ×12 IMPLANT
PACK GENERAL/GYN (CUSTOM PROCEDURE TRAY) ×4 IMPLANT
PAD ARMBOARD 7.5X6 YLW CONV (MISCELLANEOUS) ×4 IMPLANT
SPECIMEN JAR LARGE (MISCELLANEOUS) IMPLANT
SPONGE LAP 18X18 X RAY DECT (DISPOSABLE) IMPLANT
STAPLER VISISTAT 35W (STAPLE) IMPLANT
SUCTION POOLE TIP (SUCTIONS) ×4 IMPLANT
SUT NOVA 1 T20/GS 25DT (SUTURE) ×40 IMPLANT
SUT PDS AB 1 TP1 96 (SUTURE) IMPLANT
SUT VIC AB 2-0 SH 18 (SUTURE) ×4 IMPLANT
SUT VIC AB 3-0 SH 18 (SUTURE) IMPLANT
SUT VICRYL AB 2 0 TIES (SUTURE) IMPLANT
SUT VICRYL AB 3 0 TIES (SUTURE) IMPLANT
TISSUE MATRIX STRATTICE 20X25 (Tissue) ×4 IMPLANT
TOWEL OR 17X24 6PK STRL BLUE (TOWEL DISPOSABLE) ×4 IMPLANT
TOWEL OR 17X26 10 PK STRL BLUE (TOWEL DISPOSABLE) ×4 IMPLANT
TRAY FOLEY W/METER SILVER 16FR (SET/KITS/TRAYS/PACK) IMPLANT
YANKAUER SUCT BULB TIP NO VENT (SUCTIONS) IMPLANT

## 2016-11-29 NOTE — Anesthesia Preprocedure Evaluation (Signed)
Anesthesia Evaluation  Patient identified by MRN, date of birth, ID band Patient awake and Patient unresponsive    Reviewed: Allergy & Precautions, NPO status , Patient's Chart, lab work & pertinent test results  Airway Mallampati: Intubated       Dental   Pulmonary neg pulmonary ROS, pneumonia, former smoker,    breath sounds clear to auscultation       Cardiovascular hypertension, Pt. on medications negative cardio ROS   Rhythm:Regular Rate:Normal     Neuro/Psych Anxiety Depression negative neurological ROS  negative psych ROS   GI/Hepatic negative GI ROS, Neg liver ROS, hiatal hernia, GERD  ,History noted.    Endo/Other  negative endocrine ROSdiabetes  Renal/GU Renal diseasenegative Renal ROS  negative genitourinary   Musculoskeletal negative musculoskeletal ROS (+) Arthritis ,   Abdominal   Peds negative pediatric ROS (+)  Hematology negative hematology ROS (+) anemia ,   Anesthesia Other Findings   Reproductive/Obstetrics negative OB ROS                             Anesthesia Physical  Anesthesia Plan  ASA: IV  Anesthesia Plan: General   Post-op Pain Management:    Induction: Intravenous  PONV Risk Score and Plan: 2 and Ondansetron and Treatment may vary due to age or medical condition  Airway Management Planned: Oral ETT  Additional Equipment:   Intra-op Plan:   Post-operative Plan: Post-operative intubation/ventilation  Informed Consent: I have reviewed the patients History and Physical, chart, labs and discussed the procedure including the risks, benefits and alternatives for the proposed anesthesia with the patient or authorized representative who has indicated his/her understanding and acceptance.     Plan Discussed with: CRNA, Anesthesiologist and Surgeon  Anesthesia Plan Comments:         Anesthesia Quick Evaluation

## 2016-11-29 NOTE — Transfer of Care (Signed)
Immediate Anesthesia Transfer of Care Note  Patient: Krystal Campbell  Procedure(s) Performed: Procedure(s): EXPLORATORY LAPAROTOMY (N/A) PLACEMENT OF STRATTICE BIOLOGIC MESH APPLICATION OF WOUND VAC (N/A)  Patient Location: ICU  Anesthesia Type:General  Level of Consciousness: sedated and Patient remains intubated per anesthesia plan  Airway & Oxygen Therapy: Patient remains intubated per anesthesia plan and Patient placed on Ventilator (see vital sign flow sheet for setting)  Post-op Assessment: Report given to RN and Post -op Vital signs reviewed and stable  Post vital signs: Reviewed and stable  Last Vitals:  Vitals:   11/29/16 1300 11/29/16 1400  BP: (!) 115/58 (!) 101/54  Pulse: 61 (!) 55  Resp: 20 20  Temp:    SpO2: 98% 99%    Last Pain:  Vitals:   11/29/16 1200  TempSrc: Axillary  PainSc:       Patients Stated Pain Goal: 2 (68/11/57 2620)  Complications: No apparent anesthesia complications

## 2016-11-29 NOTE — Progress Notes (Signed)
3 Days Post-Op    CC:  Recurrent ventral hernia  Subjective: Sedated on the Vent.  Objective: Vital signs in last 24 hours: Temp:  [98.2 F (36.8 C)-98.5 F (36.9 C)] 98.5 F (36.9 C) (09/05 0400) Pulse Rate:  [54-94] 58 (09/05 1000) Resp:  [16-25] 20 (09/05 1000) BP: (92-113)/(48-81) 103/59 (09/05 1000) SpO2:  [94 %-100 %] 100 % (09/05 1000) FiO2 (%):  [30 %] 30 % (09/05 0920) Last BM Date: 11/22/16 2826 IV Urine 1480 Emesis 225 300 from drain Afebrile, VSS BP low end of stable Vent at FiO2 40%- 30%. Labs OK , mild anemia  CXR:  Persistent right perihilar and left basilar infiltrates and/or atelectasis. No overt pulmonary edema.   Intake/Output from previous day: 09/04 0701 - 09/05 0700 In: 2826.7 [I.V.:2826.7] Out: 2005 [Urine:1480; Emesis/NG output:225; Drains:300] Intake/Output this shift: Total I/O In: 507.2 [I.V.:507.2] Out: 40 [Urine:40]  General appearance: sedated, but wakes up some. Resp: clear anterior exam GI: soft, wound vac in place, No BS.   Lab Results:   Recent Labs  11/28/16 0955 11/29/16 1038  WBC 8.0 8.0  HGB 8.8* 8.6*  HCT 28.3* 27.7*  PLT 203 222    BMET  Recent Labs  11/28/16 0955 11/29/16 1038  NA 137 138  K 3.7 3.5  CL 108 106  CO2 22 23  GLUCOSE 118* 124*  BUN 11 9  CREATININE 0.96 0.91  CALCIUM 7.9* 7.9*   PT/INR No results for input(s): LABPROT, INR in the last 72 hours.   Recent Labs Lab 11/24/16 0436 11/27/16 0227  AST 22  --   ALT 11*  --   ALKPHOS 68  --   BILITOT 0.7  --   PROT 6.1*  --   ALBUMIN 2.9* 2.2*     Lipase     Component Value Date/Time   LIPASE 36 10/05/2009 1910     Prior to Admission medications   Medication Sig Start Date End Date Taking? Authorizing Provider  aspirin EC 81 MG tablet Take 81 mg by mouth daily.   Yes [provider]  Black Cohosh 40 MG CAPS Take 40 mg by mouth 2 (two) times daily.   Yes [provider]  citalopram (CELEXA) 20 MG tablet Take  20 mg by mouth 3 (three) times daily. 09/29/16  Yes [provider]  docusate sodium (COLACE) 100 MG capsule Take 100 mg by mouth 2 (two) times daily.   Yes [provider]  FIBER PO Take 1 tablet by mouth 2 (two) times daily.   Yes [provider]  gabapentin (NEURONTIN) 300 MG capsule Take 900 mg by mouth 3 (three) times daily. 09/29/16  Yes [provider]  Melatonin 10 MG CAPS Take 20 mg by mouth at bedtime.    Yes [provider]  metFORMIN (GLUCOPHAGE) 500 MG tablet Take 500 mg by mouth 2 (two) times daily with a meal.   Yes [provider]  pantoprazole (PROTONIX) 40 MG tablet Take 1 tablet (40 mg total) by mouth 2 (two) times daily. 10/11/14  Yes Memon, Jehanzeb, MD  vitamin B-12 (CYANOCOBALAMIN) 500 MCG tablet Take 500 mcg by mouth daily.   Yes [provider]  ibuprofen (ADVIL,MOTRIN) 800 MG tablet Take 1 tablet (800 mg total) by mouth every 8 (eight) hours as needed. Patient not taking: Reported on 11/16/2016 08/09/16   Cornett, Thomas, MD  oxyCODONE (OXY IR/ROXICODONE) 5 MG immediate release tablet Take 1-2 tablets (5-10 mg total) by mouth every 6 (six)   hours as needed for severe pain. Patient not taking: Reported on 11/16/2016 08/09/16   Cornett, Thomas, MD  oxyCODONE-acetaminophen (ROXICET) 5-325 MG tablet Take 1 tablet by mouth every 4 (four) hours as needed. 11/23/16   Cornett, Thomas, MD  vitamin B-12 1000 MCG tablet Take 1 tablet (1,000 mcg total) by mouth daily. Patient not taking: Reported on 11/16/2016 11/10/14   Dhungel, Nishant, MD    Medications: . chlorhexidine gluconate (MEDLINE KIT)  15 mL Mouth Rinse BID  . Chlorhexidine Gluconate Cloth  6 each Topical Daily  . citalopram  20 mg Per Tube TID  . gabapentin  600 mg Per Tube Q8H  . heparin  5,000 Units Subcutaneous Q8H  . insulin aspart  0-15 Units Subcutaneous Q4H  . mouth rinse  15 mL Mouth Rinse 10 times per day  . pantoprazole (PROTONIX) IV  40 mg Intravenous  Q12H  . sodium chloride flush  10-40 mL Intracatheter Q12H   . fentaNYL infusion INTRAVENOUS 300 mcg/hr (11/29/16 1200)  . lactated ringers 75 mL/hr at 11/29/16 1200  . magnesium sulfate 1 - 4 g bolus IVPB 2 g (11/29/16 1210)  . potassium PHOSPHATE IVPB (mmol)    . propofol (DIPRIVAN) infusion 25.071 mcg/kg/min (11/29/16 1200)   Anti-infectives    Start     Dose/Rate Route Frequency Ordered Stop   11/26/16 0900  cefoTEtan (CEFOTAN) 2 g in dextrose 5 % 50 mL IVPB     2 g 100 mL/hr over 30 Minutes Intravenous On call to O.R. 11/26/16 0848 11/26/16 1020   11/25/16 1545  ceFAZolin (ANCEF) IVPB 2g/100 mL premix     2 g 200 mL/hr over 30 Minutes Intravenous  Once 11/25/16 1539 11/25/16 1708   11/23/16 1730  ceFAZolin (ANCEF) IVPB 2g/100 mL premix     2 g 200 mL/hr over 30 Minutes Intravenous Every 8 hours 11/23/16 1558 11/23/16 1722   11/23/16 0600  ceFAZolin (ANCEF) 3 g in dextrose 5 % 50 mL IVPB     3 g 130 mL/hr over 30 Minutes Intravenous On call to O.R. 11/22/16 1331 11/23/16 0945      Assessment/Plan Recurrent incisional hernia S/p Removal of mesh and repair of incisional hernia primary with transversus abdominis release, 11/23/16, Dr. Thomas Cornett S/p EXPLORATORY LAPAROTOMY AND PLACEMENT OF OPEN ABDOMEN VAC, 11/25/16, Dr. Burke Thompson S/p  closure of lateral abdominal fascial for 20cm in length, opening of midline fascia, 11/26/16, Dr. Luke Kinsinger Acute respiratory distress - Mechanical ventilation post op Hx of colon cancer Type II diabetes Hyperlipidemia GERD/Diverticulosis/hiatal hernia Hx of PE/IVC filter FEN:  IV fluids/NPO ID:  8/30, 9/1, & 9/2, before each surgery pre op only DVT:  SCD     Plan:  Dr. Cornett plans surgery later today.       LOS: 6 days    JENNINGS,WILLARD 11/29/2016 [phone]  

## 2016-11-29 NOTE — Progress Notes (Signed)
PULMONARY / CRITICAL CARE MEDICINE   Name: ABEGAIL KLOEPPEL MRN: [id number] DOB: 09-29-1960    ADMISSION DATE:  11/23/2016 CONSULTATION DATE:  11/25/16  REFERRING MD:  Dr Brantley Stage  CHIEF COMPLAINT:  Consult for assistance with mechanical ventilation post-op  HISTORY OF PRESENT ILLNESS:   At the time my examination the patient is intubated and sedated and thusly unable to provide history of present illness, review of systems, past medical history, family history, social history, home medications, or allergies. Histories are obtained from family, medical providers, with a medical record.  56 year old woman admitted after surgical repair for chronic seroma with removal of mesh and repair of incisional hernia. On postop day 1 this was complicated by recurrent incisional hernia with small bowel obstruction. She was taken to the operating room and had an exploratory laparotomy and placement of open it abdominal VAC. Postoperatively she was left intubated and is being transferred to the intensive care unit. I see was consult by the attending surgeon for assistance with management of mechanical ventilation and ICU care.  Subjective/Overnight:  No change overnight.  Awaiting OR to close abd today.   VITAL SIGNS: BP (!) 103/59   Pulse (!) 58   Temp 98.5 F (36.9 C) (Axillary)   Resp 20   Ht 5\' 4"  (1.626 m)   Wt 117 kg (258 lb)   SpO2 100%   BMI 44.29 kg/m   HEMODYNAMICS:    VENTILATOR SETTINGS: Vent Mode: PRVC FiO2 (%):  [30 %] 30 % Set Rate:  [20 bmp] 20 bmp Vt Set:  [480 mL] 480 mL PEEP:  [5 cmH20] 5 cmH20 Plateau Pressure:  [21 cmH20-23 cmH20] 22 cmH20  INTAKE / OUTPUT: I/O last 3 completed shifts: In: 4207.6 [I.V.:4207.6] Out: 2905 [Urine:1980; Emesis/NG output:225; Drains:700]  PHYSICAL EXAMINATION: Gen:      No acute distress, obese, Sedated, NAD  HEENT:  [place]/AT, PERRL, EOM-I and MMM Neck:      ETT, mm moist, Supple, no masses Lungs:    resps even non labored on vent,diminished  bases otherwise clear  CV:         RRR, Nl S1/S2, -M/R/G. Abd:      Wound vac intact, soft Ext:    -edema and -tenderness Skin:       Warm and dry; no rash Neuro:    Sedated, unresponsive  LABS:  BMET  Recent Labs Lab 11/26/16 0152 11/27/16 0227 11/28/16 0955  NA 136 138 137  K 4.4 4.1 3.7  CL 102 107 108  CO2 30 23 22   BUN 7 16 11   CREATININE 0.91 1.93* 0.96  GLUCOSE 195* 118* 118*   Electrolytes  Recent Labs Lab 11/26/16 0152 11/27/16 0227 11/28/16 0955 11/28/16 1136  CALCIUM 8.1* 7.8* 7.9*  --   MG  --  1.4*  --  1.6*  PHOS  --  2.4*  --   --    CBC  Recent Labs Lab 11/26/16 0152 11/27/16 0227 11/28/16 0955  WBC 10.2 7.1 8.0  HGB 10.5* 9.5* 8.8*  HCT 33.6* 30.9* 28.3*  PLT 275 200 203   Coag's No results for input(s): APTT, INR in the last 168 hours.  Sepsis Markers No results for input(s): LATICACIDVEN, PROCALCITON, O2SATVEN in the last 168 hours.  ABG  Recent Labs Lab 11/26/16 1720 11/27/16 0410 11/29/16 0326  PHART 7.283* 7.456* 7.384  PCO2ART 51.4* 34.3 35.7  PO2ART 119.0* 124* 73.0*    Liver Enzymes  Recent Labs Lab 11/24/16 0436 11/27/16 0227  AST  22  --   ALT 11*  --   ALKPHOS 68  --   BILITOT 0.7  --   ALBUMIN 2.9* 2.2*   Cardiac Enzymes No results for input(s): TROPONINI, PROBNP in the last 168 hours.  Glucose  Recent Labs Lab 11/28/16 1158 11/28/16 1614 11/28/16 2000 11/28/16 2343 11/29/16 0402 11/29/16 0847  GLUCAP 117* 110* 78 90 97 109*   Imaging Dg Chest Port 1 View  Result Date: 11/29/2016 CLINICAL DATA:  Status post exploratory laparotomy on September second 2018. Earlier incisional hernia repair on November 23, 2016. EXAM: PORTABLE CHEST 1 VIEW COMPARISON:  Portable chest x-ray of November 28, 2016 FINDINGS: The lung volumes remain low. The retrocardiac density on the left remains increased. Right perihilar interstitial prominence persists. The heart is top-normal in size. The pulmonary vascularity is not  engorged. The endotracheal tube tip lies 2.2 cm above the carina. The esophagogastric tube tip projects below the inferior margin of the image. The right-sided PICC line tip projects at the junction of the right and left brachiocephalic veins. IMPRESSION: Persistent right perihilar and left basilar infiltrates and/or atelectasis. No overt pulmonary edema. The support tubes are in reasonable position. Electronically Signed   By: David  Martinique M.D.   On: 11/29/2016 07:44   STUDIES:    CULTURES: None  ANTIBIOTICS: None  SIGNIFICANT EVENTS: 8/30: Removal of mesh 9/1: Exploratory laparotomy wound VAC 9/2: repeat OR   LINES/TUBES: 9/1: Intubated  DISCUSSION: 56 year old woman admitted to the ICU postoperatively after urgent exploratory laparotomy for small bowel obstruction due to incisional hernia one day after having removal of abdominal mesh.   ASSESSMENT / PLAN:  PULMONARY A: Intubated for airway protection P:   Vent support - 8cc/kg  F/u CXR  F/u ABG No weaning while abd open  SBT in am once abd closed    CARDIOVASCULAR A:  No active issues P:  Telemetry Hold home aspirin Watch BP with sedation  RENAL A:   hypoMg  P:   Replete electrolytes PRN  Mg from this am pending  BMET in AM  GASTROINTESTINAL A:   Small bowel obstruction status post exploratory lap GI prophylaxis P:   Management of open abdomen per surgery, closure today  Protonix IV NGT to LIS TPN per pharmacy after placement of PICC line - will defer orders to surgery   HEMATOLOGIC A:   Normocytic anemia DVT prophylaxis P:  Follow CBC Transfuse per ICU protocol  INFECTIOUS A:   No active issue, Op-note reports no perforations and well-perfused small bowel P:   Cefotan for surgical prophylaxis  ENDOCRINE A:   DM   P:   SSI coverage  NEUROLOGIC A:   Pain and sedation P:   RASS goal: -2 while open abd  Fentanyl, propofol  FAMILY  - Updates: No family at bedside 9/5 -  Inter-disciplinary family meet or Palliative Care meeting due by:  12/02/16  Nickolas Madrid, NP 11/29/2016  11:02 AM Pager: [phone] or [phone]  Attending Note:  56 year old female s/p abdominal surgery who remains open.  PCCM consulted for vent and medical management.  On exam, lungs are clear, abdominal wound clean.  Will maintain on full support for now given OR visits.  PICC insertion for TPN.  Continue abx per surgery.  Hold off weaning.  PCCM will begin aggressive weaning once abdomen is finally closed at surgery's recommendations.  PCCM will continue to follow.  The patient is critically ill with multiple organ systems failure and  requires high complexity decision making for assessment and support, frequent evaluation and titration of therapies, application of advanced monitoring technologies and extensive interpretation of multiple databases.   Critical Care Time devoted to patient care services described in this note is  35  Minutes. This time reflects time of care of this signee Dr Jennet Maduro. This critical care time does not reflect procedure time, or teaching time or supervisory time of PA/NP/Med student/Med Resident etc but could involve care discussion time.  Rush Farmer, M.D. [facility] Pulmonary/Critical Care Medicine. Pager: [phone]. After hours pager: [phone].

## 2016-11-29 NOTE — Op Note (Addendum)
Preoperative diagnosis: Open abdomen wound VAC in place  Postoperative diagnosis: Same  Procedure: Exploratory laparotomy with closure of abdomen using  Strattice biological mesh and placement of wound VAC  Surgeon: Erroll Luna M.D.  Anesthesia: Gen.  EBL: 20 mL  Specimens: None  Indications for procedure: The patient returns the operating room 1 week after expectation of old mesh secondary to chronic inflammation and chronic stroma. She developed abdominal dehiscence 5 days ago requiring expiration and placement of wound VAC secondary to small bowel distention. She has had 2 previous explorations with replacement of wound VAC. She comes today for removal of herwound VAC and possible closure of abdomen. The procedure has been discussed with the patient.  Alternative therapies have been discussed with the patient.  Operative risks include bleeding,  Infection,  Organ injury,  Nerve injury,  Blood vessel injury,  DVT,  Pulmonary embolism,  Death,  And possible reoperation.  Medical management risks include worsening of present situation.  The success of the procedure is 50 -90 % at treating patients symptoms.  The patient understands and agrees to proceed.     Description of procedure: The patient was brought for vintage. Intubated and sedated. She's placed upon the operating table supine. Her previous wound VAC was removed. The abdomen was prepped and draped in sterile fashion. Timeout was done. Exploration the abdomen was performed. There is no evidence of any compromised bowel. The small bowel distention edema or much improved. She date by 10 cm defect. The tissues were viable. Her previous closure of her left lateral release site was intact without any signs of tearing or dehiscence. Irrigation was used. A 25 x 30 cm Strattice biologic mesh was used. This was sutured circumferentially to the defect has an onlay closure. No attempt was made to close the defect procedure given the tenuous  nature the tissue in concern for tension and dehiscence. This was done with #1 Novafil suture taking care not to injure the bowel below it. The lateral and superior and inferior aspects of the mesh were secured lateral to previous release sites transversus abdominis muscle to the fascia of the transversus abdominis muscle. This created 2 layers of closure around the defect. The defect appeared to have. The sutures placed the mesh under adequate but not excessive tension. Irrigation was used. A wound VAC was then placed. This is placed on a 125 mm of suction. All final counts sponge, needle and instruments were correct    Patient was taken intubated to ICU in stable condition.Marland Kitchen

## 2016-11-29 NOTE — Anesthesia Postprocedure Evaluation (Signed)
Anesthesia Post Note  Patient: Krystal Campbell  Procedure(s) Performed: Procedure(s) (LRB): EXPLORATORY LAPAROTOMY (N/A) PLACEMENT OF STRATTICE BIOLOGIC MESH APPLICATION OF WOUND VAC (N/A)     Patient location during evaluation: SICU Anesthesia Type: General Level of consciousness: sedated and patient remains intubated per anesthesia plan Pain management: pain level controlled Vital Signs Assessment: post-procedure vital signs reviewed and stable Respiratory status: patient remains intubated per anesthesia plan Cardiovascular status: stable Anesthetic complications: no    Last Vitals:  Vitals:   11/29/16 1630 11/29/16 1645  BP: 127/72 120/64  Pulse: (!) 56 65  Resp: 17 20  Temp:    SpO2: 97% 97%    Last Pain:  Vitals:   11/29/16 1200  TempSrc: Axillary  PainSc:                  Krystal Campbell

## 2016-11-30 ENCOUNTER — Encounter (HOSPITAL_COMMUNITY): Payer: Self-pay | Admitting: Surgery

## 2016-11-30 ENCOUNTER — Inpatient Hospital Stay (HOSPITAL_COMMUNITY): Payer: Medicare PPO

## 2016-11-30 DIAGNOSIS — Z4659 Encounter for fitting and adjustment of other gastrointestinal appliance and device: Secondary | ICD-10-CM

## 2016-11-30 DIAGNOSIS — J9600 Acute respiratory failure, unspecified whether with hypoxia or hypercapnia: Secondary | ICD-10-CM

## 2016-11-30 DIAGNOSIS — Z452 Encounter for adjustment and management of vascular access device: Secondary | ICD-10-CM

## 2016-11-30 LAB — BASIC METABOLIC PANEL
ANION GAP: 7 (ref 5–15)
BUN: 6 mg/dL (ref 6–20)
CHLORIDE: 109 mmol/L (ref 101–111)
CO2: 22 mmol/L (ref 22–32)
CREATININE: 0.81 mg/dL (ref 0.44–1.00)
Calcium: 7.6 mg/dL — ABNORMAL LOW (ref 8.9–10.3)
GFR calc non Af Amer: >60 mL/min (ref >60)
Glucose, Bld: 121 mg/dL — ABNORMAL HIGH (ref 65–99)
POTASSIUM: 3.5 mmol/L (ref 3.5–5.1)
SODIUM: 138 mmol/L (ref 135–145)

## 2016-11-30 LAB — PREALBUMIN: Prealbumin: 12.2 mg/dL — ABNORMAL LOW (ref 18–38)

## 2016-11-30 LAB — GLUCOSE, CAPILLARY
GLUCOSE-CAPILLARY: 118 mg/dL — AB (ref 65–99)
GLUCOSE-CAPILLARY: 210 mg/dL — AB (ref 65–99)
Glucose-Capillary: 138 mg/dL — ABNORMAL HIGH (ref 65–99)
Glucose-Capillary: 113 mg/dL — ABNORMAL HIGH (ref 65–99)
Glucose-Capillary: 143 mg/dL — ABNORMAL HIGH (ref 65–99)
Glucose-Capillary: 208 mg/dL — ABNORMAL HIGH (ref 65–99)

## 2016-11-30 LAB — BLOOD GAS, ARTERIAL
ACID-BASE DEFICIT: 2 mmol/L (ref 0.0–2.0)
Bicarbonate: 21.4 mmol/L (ref 20.0–28.0)
DRAWN BY: 441351
FIO2: 0.3
LHR: 20 {breaths}/min
MECHVT: 480 mL
O2 Saturation: 96.1 %
PEEP: 5 cmH2O
PO2 ART: 84.1 mmHg (ref 83.0–108.0)
Patient temperature: 98.4
pCO2 arterial: 30.8 mmHg — ABNORMAL LOW (ref 32.0–48.0)
pH, Arterial: 7.455 — ABNORMAL HIGH (ref 7.350–7.450)

## 2016-11-30 LAB — PHOSPHORUS: PHOSPHORUS: 3.6 mg/dL (ref 2.5–4.6)

## 2016-11-30 LAB — CBC
HCT: 26.8 % — ABNORMAL LOW (ref 36.0–46.0)
HEMOGLOBIN: 8.5 g/dL — AB (ref 12.0–15.0)
MCH: 28.7 pg (ref 26.0–34.0)
MCHC: 31.7 g/dL (ref 30.0–36.0)
MCV: 90.5 fL (ref 78.0–100.0)
Platelets: 228 10*3/uL (ref 150–400)
RBC: 2.96 MIL/uL — AB (ref 3.87–5.11)
RDW: 15.1 % (ref 11.5–15.5)
WBC: 7.3 10*3/uL (ref 4.0–10.5)

## 2016-11-30 LAB — MAGNESIUM: MAGNESIUM: 1.7 mg/dL (ref 1.7–2.4)

## 2016-11-30 MED ORDER — DEXAMETHASONE SODIUM PHOSPHATE 10 MG/ML IJ SOLN
10.0000 mg | Freq: Once | INTRAMUSCULAR | Status: AC
  Administered 2016-11-30: 10 mg via INTRAVENOUS
  Filled 2016-11-30: qty 1

## 2016-11-30 MED ORDER — M.V.I. ADULT IV INJ
INJECTION | INTRAVENOUS | Status: AC
  Administered 2016-11-30: 17:00:00 via INTRAVENOUS
  Filled 2016-11-30: qty 960

## 2016-11-30 MED ORDER — FENTANYL CITRATE (PF) 100 MCG/2ML IJ SOLN
25.0000 ug | INTRAMUSCULAR | Status: DC | PRN
  Administered 2016-11-30 – 2016-12-01 (×4): 100 ug via INTRAVENOUS
  Administered 2016-12-02: 50 ug via INTRAVENOUS
  Filled 2016-11-30 (×6): qty 2

## 2016-11-30 MED ORDER — CHLORHEXIDINE GLUCONATE 0.12 % MT SOLN
15.0000 mL | Freq: Two times a day (BID) | OROMUCOSAL | Status: DC
  Administered 2016-12-01: 15 mL via OROMUCOSAL
  Filled 2016-11-30: qty 15

## 2016-11-30 MED ORDER — ORAL CARE MOUTH RINSE
15.0000 mL | Freq: Two times a day (BID) | OROMUCOSAL | Status: DC
  Administered 2016-12-01 – 2016-12-07 (×12): 15 mL via OROMUCOSAL

## 2016-11-30 MED ORDER — PHENOL 1.4 % MT LIQD
1.0000 | OROMUCOSAL | Status: DC | PRN
  Administered 2016-11-30: 1 via OROMUCOSAL
  Filled 2016-11-30: qty 177

## 2016-11-30 MED ORDER — RACEPINEPHRINE HCL 2.25 % IN NEBU
0.5000 mL | INHALATION_SOLUTION | RESPIRATORY_TRACT | Status: DC | PRN
  Administered 2016-11-30 (×3): 0.5 mL via RESPIRATORY_TRACT
  Filled 2016-11-30 (×3): qty 0.5

## 2016-11-30 MED ORDER — MAGNESIUM SULFATE 4 GM/100ML IV SOLN
4.0000 g | Freq: Once | INTRAVENOUS | Status: AC
  Administered 2016-11-30: 4 g via INTRAVENOUS
  Filled 2016-11-30: qty 100

## 2016-11-30 MED ORDER — LORAZEPAM 2 MG/ML IJ SOLN
0.5000 mg | Freq: Once | INTRAMUSCULAR | Status: AC
  Administered 2016-11-30: 0.5 mg via INTRAVENOUS
  Filled 2016-11-30: qty 1

## 2016-11-30 MED ORDER — RACEPINEPHRINE HCL 2.25 % IN NEBU
INHALATION_SOLUTION | RESPIRATORY_TRACT | Status: AC
  Filled 2016-11-30: qty 0.5

## 2016-11-30 MED ORDER — ALPRAZOLAM 0.25 MG PO TABS: 0.2500 mg | ORAL_TABLET | Freq: Once | ORAL | Status: DC

## 2016-11-30 MED ORDER — METHYLPREDNISOLONE SODIUM SUCC 40 MG IJ SOLR
40.0000 mg | Freq: Four times a day (QID) | INTRAMUSCULAR | Status: DC
  Administered 2016-11-30 – 2016-12-01 (×3): 40 mg via INTRAVENOUS
  Filled 2016-11-30 (×3): qty 1

## 2016-11-30 NOTE — Progress Notes (Signed)
[facility] Progress Note Patient Name: Krystal Campbell DOB: [birth year] MRN: [id number]   Date of Service  11/30/2016  HPI/Events of Note  Anxiety - Patient is NPO d/t abdominal surgery.   eICU Interventions  Ativan 0.5 mg IV X 1.      Intervention Category Minor Interventions: Agitation / anxiety - evaluation and management  Sapna Padron Eugene 11/30/2016, 10:11 PM

## 2016-11-30 NOTE — Progress Notes (Signed)
Patient seemed anxious, was complaining she cannot breath. 02 sat at 97 on 4 liters of oxygen . MD paged to notify.

## 2016-11-30 NOTE — Progress Notes (Signed)
MD called back and new orders received.

## 2016-11-30 NOTE — Progress Notes (Signed)
RN tried to inset NG tube twice without success, patient very anxious and was uncooperative. MD called to notify.

## 2016-11-30 NOTE — Progress Notes (Signed)
MD called back, at this time patient vomited small amount of bile colored  Liquid. MD notified and order for NG tube received.

## 2016-11-30 NOTE — Progress Notes (Signed)
[facility] Progress Note Patient Name: JENNIEFER SALAK DOB: Dec 20, 1960 MRN: [id number]   Date of Service  11/30/2016  HPI/Events of Note  Nausea and vomiting bile - already NPO.   eICU Interventions  Will order: 1. NGT to LIS. 2. Please give already ordered Zofran.      Intervention Category Major Interventions: Other:  Lysle Dingwall 11/30/2016, 9:21 PM

## 2016-11-30 NOTE — Progress Notes (Addendum)
[facility] Progress Note Patient Name: Krystal Campbell DOB: [birth year] MRN: [id number]   Date of Service  11/30/2016  HPI/Events of Note  Extubated 1 hour ago --> Stridor. Sat = 92% and RR = 19. BP = 151/84 and HR = 108.  eICU Interventions  Will order: 1. Racemic Epinephrine Neb PRN. 2. Solumedrol 40 mg IV now and Q 6 hours. 2. Ground team requested to evaluate the patient at bedside.      Intervention Category Major Interventions: Other:  Lysle Dingwall 11/30/2016, 4:29 PM

## 2016-11-30 NOTE — Progress Notes (Signed)
PULMONARY / CRITICAL CARE MEDICINE   Name: Krystal Campbell MRN: [id number] DOB: [birth year]    ADMISSION DATE:  11/23/2016 CONSULTATION DATE:  11/25/16  REFERRING MD:  Dr Brantley Stage  CHIEF COMPLAINT:  Consult for assistance with mechanical ventilation post-op  HISTORY OF PRESENT ILLNESS:   At the time my examination the patient is intubated and sedated and thusly unable to provide history of present illness, review of systems, past medical history, family history, social history, home medications, or allergies. Histories are obtained from family, medical providers, with a medical record.  56 year old woman admitted after surgical repair for chronic seroma with removal of mesh and repair of incisional hernia. On postop day 1 this was complicated by recurrent incisional hernia with small bowel obstruction. She was taken to the operating room and had an exploratory laparotomy and placement of open it abdominal VAC. Postoperatively she was left intubated and is being transferred to the intensive care unit. I see was consult by the attending surgeon for assistance with management of mechanical ventilation and ICU care.  Subjective/Overnight:  rass deep  VITAL SIGNS: BP (!) 100/58   Pulse (!) 57   Temp 98.6 F (37 C) (Axillary)   Resp 20   Ht 5\' 4"  (1.626 m)   Wt 117 kg (258 lb)   SpO2 99%   BMI 44.29 kg/m   HEMODYNAMICS:    VENTILATOR SETTINGS: Vent Mode: PRVC FiO2 (%):  [30 %] 30 % Set Rate:  [20 bmp] 20 bmp Vt Set:  [450 mL-480 mL] 480 mL PEEP:  [5 cmH20] 5 cmH20 Plateau Pressure:  [19 cmH20-24 cmH20] 24 cmH20  INTAKE / OUTPUT: I/O last 3 completed shifts: In: 4831.1 [I.V.:4500.1; NG/GT:90; IV Piggyback:241] Out: 10 [Urine:2540; Emesis/NG output:225; Drains:550; Blood:50]  PHYSICAL EXAMINATION: General: rass -3 Neuro: rass -3 HEENT: ett, obese neck PULM: CTA CV: s1 s2 RRR GI: wound vac, per closed, no r/g, low BS Extremities: edema yes   LABS:  BMET  Recent  Labs Lab 11/28/16 0955 11/29/16 1038 11/30/16 0337  NA 137 138 138  K 3.7 3.5 3.5  CL 108 106 109  CO2 22 23 22   BUN 11 9 6   CREATININE 0.96 0.91 0.81  GLUCOSE 118* 124* 121*   Electrolytes  Recent Labs Lab 11/27/16 0227 11/28/16 0955 11/28/16 1136 11/29/16 1038 11/30/16 0337  CALCIUM 7.8* 7.9*  --  7.9* 7.6*  MG 1.4*  --  1.6* 1.9 1.7  PHOS 2.4*  --   --  2.8 3.6   CBC  Recent Labs Lab 11/28/16 0955 11/29/16 1038 11/30/16 0337  WBC 8.0 8.0 7.3  HGB 8.8* 8.6* 8.5*  HCT 28.3* 27.7* 26.8*  PLT 203 222 228   Coag's No results for input(s): APTT, INR in the last 168 hours.  Sepsis Markers No results for input(s): LATICACIDVEN, PROCALCITON, O2SATVEN in the last 168 hours.  ABG  Recent Labs Lab 11/27/16 0410 11/29/16 0326 11/30/16 0505  PHART 7.456* 7.384 7.455*  PCO2ART 34.3 35.7 30.8*  PO2ART 124* 73.0* 84.1    Liver Enzymes  Recent Labs Lab 11/24/16 0436 11/27/16 0227  AST 22  --   ALT 11*  --   ALKPHOS 68  --   BILITOT 0.7  --   ALBUMIN 2.9* 2.2*   Cardiac Enzymes No results for input(s): TROPONINI, PROBNP in the last 168 hours.  Glucose  Recent Labs Lab 11/29/16 1348 11/29/16 1641 11/29/16 2014 11/29/16 2343 11/30/16 0357 11/30/16 0848  GLUCAP 111* 126* 117* 108* 118* 138*  Imaging Dg Chest Port 1 View  Result Date: 11/30/2016 CLINICAL DATA:  Intubated. EXAM: PORTABLE CHEST 1 VIEW COMPARISON:  11/29/2016 FINDINGS: An endotracheal tube terminates 2 cm above the carina. An enteric tube courses into the stomach with its tip not imaged. Right PICC terminates over the lower SVC. The cardiomediastinal silhouette is unchanged. Lung volumes remain diminished with stable to slightly increased bilateral perihilar and basilar opacities no overt pulmonary edema, sizable pleural effusion, or pneumothorax is identified. IMPRESSION: Low lung volumes with stable to slightly increased perihilar and basilar lung opacities, likely atelectasis though  infection is not excluded. Electronically Signed   By: Logan Bores M.D.   On: 11/30/2016 08:10   STUDIES:    CULTURES: None  ANTIBIOTICS: None  SIGNIFICANT EVENTS: 8/30: Removal of mesh 9/1: Exploratory laparotomy wound VAC 9/2: repeat OR  9/4 belly closed  LINES/TUBES: 9/1: Intubated  DISCUSSION: 56 year old woman admitted to the ICU postoperatively after urgent exploratory laparotomy for small bowel obstruction due to incisional hernia one day after having removal of abdominal mesh.   ASSESSMENT / PLAN:  PULMONARY A: Intubated for airway protection P:   pcxr improved rass after d/w surgery to increase rass Wean aggressive cpap 5 ps5-10, after able to make wake up ABg reviewed, rate redcution on rest Even to slight pos balance goals  CARDIOVASCULAR A:  POS balance P:  Telemetry Hold home aspirin  RENAL A:   hypoMg  P:   Replace mag again Pos balance okay for now, slight reduction in this  GASTROINTESTINAL A:   Small bowel obstruction status post exploratory lap GI prophylaxis P:   Management of open abdomen per surgery, closure done, move to weaning Protonix IV NGT to LIS TPN for now  HEMATOLOGIC A:   Normocytic anemia DVT prophylaxis P:  Follow CBC Sub q heparin   INFECTIOUS A:   No active issue, Op-note reports no perforations and well-perfused small bowel P:   Cefotan for surgical prophylaxis - dc as abdo closed  ENDOCRINE A:   DM   P:   SSI coverage  NEUROLOGIC A:   Pain and sedation P:   RASS goal: to -1 to 0 Fentanyl, propofol - interrupt as abdo closed, for SBT Upright with small lung volumes  FAMILY  - Updates: No family at bedside 9/5 - Inter-disciplinary family meet or Palliative Care meeting due by:  12/02/16   Ccm time 30 min   Lavon Paganini. Titus Mould, MD, [place] Pgr: [facility]

## 2016-11-30 NOTE — Progress Notes (Signed)
[facility] NOTE   Pharmacy Consult for TPN Indication: prolonged ileus  Patient Measurements: Height: 5\' 4"  (162.6 cm) Weight: 258 lb (117 kg) IBW/kg (Calculated) : 54.7 TPN AdjBW (KG): 70.3 Body mass index is 44.29 kg/m.  Assessment:  Recurrent incisional hernia s/p ex lap and placement of open abdominal vac. Chronic seroma. Starting TNA due    GI: pre-albumin 12.2 Endo: cbgs controlled < 150 Insulin requirements in the past 24 hours: 2 units SSI Lytes: lytes wnl Renal: SCr 08 Pulm: Vent post-op Cards: VSS Hepatobil: Recent LFTs wnl Neuro: Sedated on fentanyl gtt and propofol ID: peri-op Ancef, otherwise no active issues  Best Practices: TPN Access: PICC placed 9/4 TPN start date: 11/30/16  Nutritional Goals: Per RD on 9/4 KCal: 9147-8295  Protein: 136 g  Current Nutrition: N/A  Plan:  Clinimix E 5/15 @ 40 ml/hr  No IVFA due to ICU, also getting calories from propofol  Hold 20% lipid emulsion for first 7 days for ICU patients per ASPEN guidelines (Start date 12/07/16) This provides 48 g of protein and 682 kCals per day  Add  MVI and trace elements in TPN Continue mSSI and adjust as needed F/u TPN labs tomorrow   Krystal Campbell 11/30/2016,11:09 AM

## 2016-11-30 NOTE — Procedures (Signed)
Extubation Procedure Note  Patient Details:   Name: Krystal Campbell DOB: 05-Jul-1960 MRN: [id number]   Airway Documentation:     Evaluation  O2 sats: stable throughout Complications: No apparent complications Patient did tolerate procedure well. Bilateral Breath Sounds: Clear, Diminished   Yes  Tamera Reason 11/30/2016, 3:27 PM

## 2016-11-30 NOTE — Consult Note (Signed)
WOC consult requested to change abd Vac dressing on Fri am with surgical PA present for wound assessment during the first post-op dressing change.  Will plan to perform procedure as requested tomorrow. Julien Girt MSN, RN, [place], [place], [place]

## 2016-11-30 NOTE — Progress Notes (Signed)
1 Day Post-Op    CC:  Recurrent ventral hernia  Subjective: They just turned off the fentanyl and she is awake and tearing/crying.  Still intubated, CCM is going to try and wean her Vent.    Objective: Vital signs in last 24 hours: Temp:  [96.8 F (36 C)-99 F (37.2 C)] 98.6 F (37 C) (09/06 0756) Pulse Rate:  [53-68] 57 (09/06 0756) Resp:  [17-20] 20 (09/06 0756) BP: (89-127)/(44-72) 100/58 (09/06 0756) SpO2:  [93 %-100 %] 99 % (09/06 0756) FiO2 (%):  [30 %] 30 % (09/06 0756) Last BM Date: 11/22/16 3131 IV NG 90 1820 IV 250 drain  Afebrile, VSS ABGS good CBC/BMP OK;  H/H is stable CXR this AM:  Low lung volumes with stable to slightly increased perihilar and basilar lung opacities, likely atelectasis though infection is not excluded. Intake/Output from previous day: 09/05 0701 - 09/06 0700 In: 3462.5 [I.V.:3131.5; NG/GT:90; IV Piggyback:241] Out: 2120 [Urine:1820; Drains:250; Blood:50] Intake/Output this shift: No intake/output data recorded.  General appearance: alert and mild distress with decrease in sedation/fentanyl.  Crying right now.   Resp: clear to auscultation bilaterally and anterior exam Cardio: sinus tachy ABD:  Wound vac in place.  No BS   Lab Results:   Recent Labs  11/29/16 1038 11/30/16 0337  WBC 8.0 7.3  HGB 8.6* 8.5*  HCT 27.7* 26.8*  PLT 222 228    BMET  Recent Labs  11/29/16 1038 11/30/16 0337  NA 138 138  K 3.5 3.5  CL 106 109  CO2 23 22  GLUCOSE 124* 121*  BUN 9 6  CREATININE 0.91 0.81  CALCIUM 7.9* 7.6*   PT/INR No results for input(s): LABPROT, INR in the last 72 hours.   Recent Labs Lab 11/24/16 0436 11/27/16 0227  AST 22  --   ALT 11*  --   ALKPHOS 68  --   BILITOT 0.7  --   PROT 6.1*  --   ALBUMIN 2.9* 2.2*     Lipase     Component Value Date/Time   LIPASE 36 10/05/2009 1910     Medications: . chlorhexidine gluconate (MEDLINE KIT)  15 mL Mouth Rinse BID  . Chlorhexidine Gluconate Cloth  6 each  Topical Daily  . citalopram  20 mg Per Tube TID  . gabapentin  600 mg Per Tube Q8H  . heparin  5,000 Units Subcutaneous Q8H  . insulin aspart  0-15 Units Subcutaneous Q4H  . mouth rinse  15 mL Mouth Rinse 10 times per day  . pantoprazole (PROTONIX) IV  40 mg Intravenous Q12H  . sodium chloride flush  10-40 mL Intracatheter Q12H   . fentaNYL infusion INTRAVENOUS 75 mcg/hr (11/30/16 1030)  . lactated ringers 10 mL/hr at 11/30/16 1040  . magnesium sulfate 1 - 4 g bolus IVPB    . propofol (DIPRIVAN) infusion Stopped (11/30/16 1030)     Assessment/Plan Recurrent incisional hernia S/p Removal of mesh and repair of incisional hernia primary with transversus abdominis release, 11/23/16, Dr. Erroll Luna S/p EXPLORATORY LAPAROTOMY AND PLACEMENT OF OPEN ABDOMEN VAC, 11/25/16, Dr. Georganna Skeans S/p closure of lateral abdominal fascial for 20cm in length, opening of midline fascia, 11/26/16, Dr. Gurney Maxin S/p Exploratory laparotomy with closure of abdomen using  Strattice biological mesh and placement of wound VAC, 11/29/16, Thomas Cornett Acute respiratory distress - Mechanical ventilation post op Malnutrition - starting TNA; prealbumin ordered.   Hx of colon cancer Type II diabetes Hyperlipidemia GERD/Diverticulosis/hiatal hernia Hx of PE/IVC filter FEN:  IV fluids/NPO ID:  8/30, 9/1, & 9/2, before each surgery pre op only DVT:  SCD  Plan:  CCM is going to start weaning the Vent.  We are starting TNA.  Wound vac will be changed tomorrow.  LOS: 7 days    Josha Weekley 11/30/2016 [phone]

## 2016-12-01 ENCOUNTER — Inpatient Hospital Stay (HOSPITAL_COMMUNITY): Payer: Medicare PPO

## 2016-12-01 LAB — BASIC METABOLIC PANEL
Anion gap: 15 (ref 5–15)
BUN: 10 mg/dL (ref 6–20)
CALCIUM: 8.7 mg/dL — AB (ref 8.9–10.3)
CHLORIDE: 104 mmol/L (ref 101–111)
CO2: 23 mmol/L (ref 22–32)
CREATININE: 0.88 mg/dL (ref 0.44–1.00)
GFR calc non Af Amer: >60 mL/min (ref >60)
Glucose, Bld: 205 mg/dL — ABNORMAL HIGH (ref 65–99)
Potassium: 3.5 mmol/L (ref 3.5–5.1)
SODIUM: 142 mmol/L (ref 135–145)

## 2016-12-01 LAB — GLUCOSE, CAPILLARY
GLUCOSE-CAPILLARY: 218 mg/dL — AB (ref 65–99)
GLUCOSE-CAPILLARY: 223 mg/dL — AB (ref 65–99)
Glucose-Capillary: 194 mg/dL — ABNORMAL HIGH (ref 65–99)
Glucose-Capillary: 213 mg/dL — ABNORMAL HIGH (ref 65–99)
Glucose-Capillary: 216 mg/dL — ABNORMAL HIGH (ref 65–99)

## 2016-12-01 LAB — CBC WITH DIFFERENTIAL/PLATELET
BASOS ABS: 0 10*3/uL (ref 0.0–0.1)
BASOS PCT: 0 %
EOS ABS: 0 10*3/uL (ref 0.0–0.7)
EOS PCT: 0 %
HCT: 30.3 % — ABNORMAL LOW (ref 36.0–46.0)
HEMOGLOBIN: 9.3 g/dL — AB (ref 12.0–15.0)
LYMPHS ABS: 0.5 10*3/uL — AB (ref 0.7–4.0)
Lymphocytes Relative: 5 %
MCH: 28.1 pg (ref 26.0–34.0)
MCHC: 30.7 g/dL (ref 30.0–36.0)
MCV: 91.5 fL (ref 78.0–100.0)
Monocytes Absolute: 0.3 10*3/uL (ref 0.1–1.0)
Monocytes Relative: 3 %
NEUTROS PCT: 92 %
Neutro Abs: 9.4 10*3/uL — ABNORMAL HIGH (ref 1.7–7.7)
PLATELETS: 286 10*3/uL (ref 150–400)
RBC: 3.31 MIL/uL — AB (ref 3.87–5.11)
RDW: 14.6 % (ref 11.5–15.5)
WBC: 10.2 10*3/uL (ref 4.0–10.5)

## 2016-12-01 LAB — COMPREHENSIVE METABOLIC PANEL
ALBUMIN: 2 g/dL — AB (ref 3.5–5.0)
ALT: 13 U/L — AB (ref 14–54)
AST: 25 U/L (ref 15–41)
Alkaline Phosphatase: 68 U/L (ref 38–126)
Anion gap: 8 (ref 5–15)
BUN: 7 mg/dL (ref 6–20)
CHLORIDE: 106 mmol/L (ref 101–111)
CO2: 24 mmol/L (ref 22–32)
CREATININE: 0.84 mg/dL (ref 0.44–1.00)
Calcium: 8.2 mg/dL — ABNORMAL LOW (ref 8.9–10.3)
GFR calc Af Amer: >60 mL/min (ref >60)
GFR calc non Af Amer: >60 mL/min (ref >60)
GLUCOSE: 481 mg/dL — AB (ref 65–99)
POTASSIUM: 4.5 mmol/L (ref 3.5–5.1)
SODIUM: 138 mmol/L (ref 135–145)
Total Bilirubin: 0.8 mg/dL (ref 0.3–1.2)
Total Protein: 5.4 g/dL — ABNORMAL LOW (ref 6.5–8.1)

## 2016-12-01 LAB — PREALBUMIN: PREALBUMIN: 6.5 mg/dL — AB (ref 18–38)

## 2016-12-01 LAB — MAGNESIUM
MAGNESIUM: 2 mg/dL (ref 1.7–2.4)
Magnesium: 1.8 mg/dL (ref 1.7–2.4)

## 2016-12-01 LAB — PHOSPHORUS
Phosphorus: 3.2 mg/dL (ref 2.5–4.6)
Phosphorus: <1 mg/dL — CL (ref 2.5–4.6)

## 2016-12-01 LAB — TRIGLYCERIDES: TRIGLYCERIDES: 173 mg/dL — AB (ref <150)

## 2016-12-01 MED ORDER — LORAZEPAM 2 MG/ML IJ SOLN
1.0000 mg | Freq: Four times a day (QID) | INTRAMUSCULAR | Status: DC | PRN
  Administered 2016-12-01 – 2016-12-04 (×8): 1 mg via INTRAVENOUS
  Filled 2016-12-01 (×10): qty 1

## 2016-12-01 MED ORDER — MAGNESIUM SULFATE 50 % IJ SOLN
3.0000 g | Freq: Once | INTRAVENOUS | Status: AC
  Administered 2016-12-01: 3 g via INTRAVENOUS
  Filled 2016-12-01: qty 6

## 2016-12-01 MED ORDER — TRACE MINERALS CR-CU-MN-SE-ZN 10-1000-500-60 MCG/ML IV SOLN
INTRAVENOUS | Status: AC
  Administered 2016-12-01: 18:00:00 via INTRAVENOUS
  Filled 2016-12-01: qty 960

## 2016-12-01 MED ORDER — ALBUTEROL SULFATE (2.5 MG/3ML) 0.083% IN NEBU
2.5000 mg | INHALATION_SOLUTION | Freq: Four times a day (QID) | RESPIRATORY_TRACT | Status: DC | PRN
  Administered 2016-12-01 – 2016-12-07 (×5): 2.5 mg via RESPIRATORY_TRACT
  Filled 2016-12-01 (×6): qty 3

## 2016-12-01 MED ORDER — FUROSEMIDE 10 MG/ML IJ SOLN
20.0000 mg | Freq: Once | INTRAMUSCULAR | Status: AC
  Administered 2016-12-01: 20 mg via INTRAVENOUS
  Filled 2016-12-01: qty 2

## 2016-12-01 MED ORDER — POTASSIUM PHOSPHATES 15 MMOLE/5ML IV SOLN
30.0000 mmol | INTRAVENOUS | Status: AC
  Administered 2016-12-01: 30 mmol via INTRAVENOUS
  Filled 2016-12-01 (×2): qty 10

## 2016-12-01 MED ORDER — WHITE PETROLATUM GEL
Status: AC
  Administered 2016-12-01: 03:00:00
  Filled 2016-12-01: qty 1

## 2016-12-01 NOTE — Progress Notes (Signed)
Patient complained that she can't breath, 02 sat at 98 on 4 liters. MD paged to notify.

## 2016-12-01 NOTE — Progress Notes (Signed)
Phos is less than 1.  Pharmacy is already replacing.  She is very anxious, and on Ativan 1 mg IV q6h.  Nurse asking about the other PO meds Celexa and gabapentin.  I told her to hold them for now.  She is also NPO, asking about ice/sips.  I told them if DR.Cornett said to keep her NPO this AM we would continue his plan for now.

## 2016-12-01 NOTE — Consult Note (Addendum)
[facility] Nurse wound consult note Pt is followed by the surgical team for assessment and plan of care for abd wound. Reason for Consult: First post-op Vac dressing changed; Dr Brantley Stage at the bedside to assess wound appearance. Wound type: Full thickness post-op wound to midline abd Measurement: 14X9X6cm with undermining to wound edges to 6 cm Wound bed: White allograft material visible to inner wound bed with sutures, surrounding edges beefy red Drainage (amount, consistency, odor) Mod amt pink drainage in the lower wound and the Vac cannister, no odor Periwound: Intact skin surrounding Dressing procedure/placement/frequency: Applied nonadherent contact layer to inner wound, then 2 pieces black foam to 149mm cont suction.  Pt tolerated with mod amt discomfort after pain meds were given.  Plan for dressing change on Monday. Julien Girt MSN, RN, [place], [place], [place]

## 2016-12-01 NOTE — Progress Notes (Signed)
   12/01/16 1300  Clinical Encounter Type  Visited With Patient  Visit Type Spiritual support  Referral From Nurse  Consult/Referral To Chaplain  Spiritual Encounters  Spiritual Needs Prayer;Emotional  Stress Factors  Patient Stress Factors Health changes;Family relationships;Loss  Family Stress Factors None identified    Per the recommendation of the nursing staff during rounds I visited with Olin Hauser.  I observed Nazaret sitting up in the chair with her suction wand in her hand.  Nikkie looked tired which can be contributed to her fear of sleeping.  I explored several issues with Harshini around family support and she brought up her faith and death and dying.  Elyssia requested that she wanted to go to heaven and does not go to church.  I instructed Denesia that she can give her life to the [facility] if she so chose.  Part of her fear was the unresolved issues of her eternal state.  Patient requested that I tell her a story so that she could try to go to sleep.  She is suffering from anxiety and has medicine to try and remedy her situation.  Khyla wants to get better for the sake of her grandchildren and even requested that I call her daughter to see when she was arriving.  I told the patient that I would follow up with her to see if she did in fact get some sleep and if her family arrived.  She has a fear of being alone in part because she feels like she would die.  I assured her the staff is providing an excellent level of care to her and she can trust them the same way she trusted myself in being open with her.

## 2016-12-01 NOTE — Progress Notes (Signed)
PULMONARY / CRITICAL CARE MEDICINE   Name: Krystal Campbell MRN: [id number] DOB: [birth year]    ADMISSION DATE:  11/23/2016 CONSULTATION DATE:  11/25/16  REFERRING MD:  Dr Brantley Stage  CHIEF COMPLAINT:  Consult for assistance with mechanical ventilation post-op  HISTORY OF PRESENT ILLNESS:   At the time my examination the patient is intubated and sedated and thusly unable to provide history of present illness, review of systems, past medical history, family history, social history, home medications, or allergies. Histories are obtained from family, medical providers, with a medical record.  56 year old woman admitted after surgical repair for chronic seroma with removal of mesh and repair of incisional hernia. On postop day 1 this was complicated by recurrent incisional hernia with small bowel obstruction. She was taken to the operating room and had an exploratory laparotomy and placement of open it abdominal VAC. Postoperatively she was left intubated and is being transferred to the intensive care unit. I see was consult by the attending surgeon for assistance with management of mechanical ventilation and ICU care.  Subjective/Overnight:  Extubated 11/30/2016, Doing well on 4L, follows commands  VITAL SIGNS: BP (!) 155/79   Pulse 89   Temp 98.6 F (37 C) (Oral)   Resp (!) 21   Ht 5\' 4"  (1.626 m)   Wt 258 lb (117 kg)   SpO2 97%   BMI 44.29 kg/m   HEMODYNAMICS:    VENTILATOR SETTINGS: Vent Mode: PSV;CPAP FiO2 (%):  [40 %] 40 % Set Rate:  [14 bmp] 14 bmp PEEP:  [5 cmH20] 5 cmH20 Pressure Support:  [5 cmH20] 5 cmH20 Plateau Pressure:  [14 cmH20] 14 cmH20  INTAKE / OUTPUT: I/O last 3 completed shifts: In: 3001.1 [I.V.:3001.1] Out: 1017 [Urine:3260; Emesis/NG output:180; Drains:235]  PHYSICAL EXAMINATION: General: rass 2 Neuro: Awake and alert, following commands HEENT: Normocephalic/ Atraumatic, obese neck, No LAD PULM: Rhonchi with faint wheeze noted , RRR, Diminished per  bases CV: s1 s2 RRR, NO RMG GI: wound vac, per closed, no r/g, low BS Extremities: edema 1+   LABS:  BMET  Recent Labs Lab 11/29/16 1038 11/30/16 0337 12/01/16 0504  NA 138 138 138  K 3.5 3.5 4.5  CL 106 109 106  CO2 23 22 24   BUN 9 6 7   CREATININE 0.91 0.81 0.84  GLUCOSE 124* 121* 481*   Electrolytes  Recent Labs Lab 11/29/16 1038 11/30/16 0337 12/01/16 0504  CALCIUM 7.9* 7.6* 8.2*  MG 1.9 1.7 2.0  PHOS 2.8 3.6 3.2   CBC  Recent Labs Lab 11/29/16 1038 11/30/16 0337 12/01/16 0504  WBC 8.0 7.3 10.2  HGB 8.6* 8.5* 9.3*  HCT 27.7* 26.8* 30.3*  PLT 222 228 286   Coag's No results for input(s): APTT, INR in the last 168 hours.  Sepsis Markers No results for input(s): LATICACIDVEN, PROCALCITON, O2SATVEN in the last 168 hours.  ABG  Recent Labs Lab 11/27/16 0410 11/29/16 0326 11/30/16 0505  PHART 7.456* 7.384 7.455*  PCO2ART 34.3 35.7 30.8*  PO2ART 124* 73.0* 84.1    Liver Enzymes  Recent Labs Lab 11/27/16 0227 12/01/16 0504  AST  --  25  ALT  --  13*  ALKPHOS  --  68  BILITOT  --  0.8  ALBUMIN 2.2* 2.0*   Cardiac Enzymes No results for input(s): TROPONINI, PROBNP in the last 168 hours.  Glucose  Recent Labs Lab 11/30/16 1127 11/30/16 1534 11/30/16 1951 11/30/16 2357 12/01/16 0424 12/01/16 0754  GLUCAP 113* 143* 208* 210* 218* 223*  Imaging Dg Chest Port 1 View  Result Date: 12/01/2016 CLINICAL DATA:  Respiratory failure EXAM: PORTABLE CHEST 1 VIEW COMPARISON:  11/30/2016 FINDINGS: Endotracheal tube and NG tube removed. Right arm PICC tip in the SVC Improved lung volume since yesterday. Worsening airspace disease bilaterally right greater than left. No significant effusion. IMPRESSION: Endotracheal tube removed. Increased lung volume with worsening bilateral airspace disease right greater than left. Possible edema or pneumonia. Electronically Signed   By: Franchot Gallo M.D.   On: 12/01/2016 07:36   STUDIES:     CULTURES: None  ANTIBIOTICS: None  SIGNIFICANT EVENTS: 8/30: Removal of mesh 9/1: Exploratory laparotomy wound VAC 9/2: repeat OR  9/4 belly closed 9/6 Extubated  LINES/TUBES: 9/1: Intubated  DISCUSSION: 56 year old woman admitted to the ICU postoperatively after urgent exploratory laparotomy for small bowel obstruction due to incisional hernia one day after having removal of abdominal mesh.   ASSESSMENT / PLAN:  PULMONARY A: Extubated 9/6 Protecting airway 9/7 CXR : Increased lung volumes with worsening bilat. Air space disease R>L Edema vs Pneumonia P:   pcxr looks wet despite net negative 500 cc last 24 Lasix 20 mg x 1 Even to slight pos balance goals Titrate oxygen for saturations 90-95% Mobilize, IS Aggressive pulmonary toilet  CARDIOVASCULAR A:  POS balance P:  Telemetry Hold home aspirin  RENAL A:   HypoMg>> resolved  Negative 500cc 9/6 P:   Mag repleted 9/6 Lasix 20 mg x 1 Trend BMET daily Replete electrolytes as needed  GASTROINTESTINAL A:   Small bowel obstruction status post exploratory lap GI prophylaxis P:   Management of open abdomen per surgery, closure done, extubated 9/6 Wound vac per surgery Protonix IV NGT to LIS TPN for now  HEMATOLOGIC A:   Normocytic anemia DVT prophylaxis  P:  Follow CBC Sub q heparin  Monitor for any obvious bleeding  INFECTIOUS A:   No active issue, Op-note reports no perforations and well-perfused small bowel Slight bump in WBC overnight 9/7 Afebrile  P:   Cefotan for surgical prophylaxis - dc'd as abdo closed Follow WBC and Fever curve  Trend CBC  ENDOCRINE A:   DM   P:   SSI coverage CBG Q 4  NEUROLOGIC A: Awake and following commands   Pain and sedation Ativan added for anxiety per surgery P:   Minimize sedation/ pain medication Frequent re-orientation Lights on during the day/off at night Upright with small lung volumes  FAMILY  - Updates: No family at bedside  9/7 - Inter-disciplinary family meet or Palliative Care meeting due by:  12/02/16  Scheduled steroids d/c'd by surgery to enhance tissue healing. Per surgery, ok to use prn. As patient is extubated CCM will sign off. Please don't hesitate to call if we can be of further assistance.  Magdalen Spatz, AGACNP-BC [facility] Pulmonary/Critical Care Medicine Pager # [phone]  12/01/2016 0930  STAFF NOTE: Linwood Dibbles, MD FACP have personally reviewed patient's available data, including medical history, events of note, physical examination and test results as part of my evaluation. I have discussed with resident/NP and other care providers such as pharmacist, RN and RRT. In addition, I personally evaluated patient and elicited key findings of:  Awake, no overt stridor, lungs coarse, abdo wound vac, edema legs, was neg 2.1 liters, after extubation  Had some stridor thet has improved with one dose decardon and rac epi, agree we should avoid steroids further, pcxr reviewed by me shows increase in ATX rt base and likely some edema contribution,  she has tolerated neg balance, maintain tpn, lasix as to goal again same 1 liter neg, chem in am , anxiety ativan ordered, consider move to SDU, mobilize as able, I updated pt in room  [employer]. Titus Mould, MD, [place] Pgr: [facility] 12/01/2016 10:11 AM

## 2016-12-01 NOTE — Progress Notes (Signed)
MD called back and will new orders received. Will continue to monitor.

## 2016-12-01 NOTE — Progress Notes (Addendum)
PHARMACY - ADULT TOTAL PARENTERAL NUTRITION CONSULT NOTE   Pharmacy Consult:  TPN Indication:  SBO  Patient Measurements: Height: 5\' 4"  (162.6 cm) Weight: 258 lb (117 kg) IBW/kg (Calculated) : 54.7 TPN AdjBW (KG): 70.3 Body mass index is 44.29 kg/m.  Assessment:  28 YOF with history of abdominal wall seroma presented on 11/23/16 for removal of mesh and hernia repair.  11/25/16 CT showed recurrent hernia with SBO and patient underwent emergent ex-lap with placement of wound vac.  On 11/26/16, she had closure of the lateral abdominal fascia and opening of the midline fascia.  On 11/29/16, patient underwent ex-lap with closure of abdomen and placement of biological mesh and wound VAC.  Pharmacy consulted to manage TPN until GI function returns.  GI: hx colon cancer/GERD - PPI IV.  Change wound VAC MWF.  Pre-albumin 12.2 > 6.5 d/t stress of illness.  NG O/P 180 mL, drain O/P 235 mL Endo: DM on metformin PTA - CBGs elevated d/t steroid (Decadron x1 on 9/6, SM x3 doses and now off).  Steroid started at the same time as TPN infusion; therefore, unsure whether elevated CBGs are d/t to steroid, TPN or both.  Last steroid dose was around 0500 this morning.  Most recent CBG is elevated but has improved.   Insulin requirements in the past 24 hours:  9 units SSI Lytes: all WNL but appears contaminated with TPN Renal: SCr 0.84 stable, BUN WNL - UOP 1.1 ml/kg/hr, LR at 10 ml/hr Pulm: intubated post-op 9/1, extubated 9/6 to 4L [place], SOB overnight Cards: HLD - BP elevated, HR high normal - PRN hydralazine/metoprolol AC/Heme: hx PE post IVC filter.  CBC stable. Hepatobil: LFTs / tbili WNL.  TG mildly elevated at 173. Neuro: PRN Fentanyl, Celexa, gabapentin.  Off Propofol - GCS 15, RASS 0 ID: afebrile, WBC WNL - not on abx Best Practices: heparin SQ TPN Access: PICC placed 11/28/16 TPN start date: 11/30/16  Nutritional Goals:  Re-estimated needs: 1750-1900 kCal and 100-115 gm protein per day  Current  Nutrition: TPN   Plan:  - Continue Clinimix E 5/15 at 40 ml/hr.  Hold off on advancing d/t elevated CBGs. - Hold ILE for the first 7 days of TPN in ICU patient per ASPEN/SCCM guidelines (start date 12/07/16) - Daily multivitamin and trace elements in TPN - Continue moderate SSI Q4H + add 7 units regular insulin to TPN - F/U daily, CBGs   Dahmir Epperly D. Mina Marble, PharmD, BCPS Pager:  [phone] 12/01/2016, 1:19 PM   ===========================   Addendum: AM labs contaminated with TPN Repeat labs resulted at: K+ 3.5, Mag 1.8, Phos < 1.  Appears to be refeeding. Family reports that patient eats at 75% of capacity PTA.   Plan: KPhos 30 mmol IV x 1 Mag sulfate 3gm IV x 1 Recheck phos    Tyheim Vanalstyne D. Mina Marble, PharmD, [place] Pager:  [phone] 12/01/2016, 3:31 PM

## 2016-12-01 NOTE — Progress Notes (Signed)
CRITICAL VALUE ALERT  Critical Value:  Phos, <1  Date & Time Notied:  12/01/16, 1455  Provider Notified: Dr Erroll Luna  Orders Received/Actions taken: CCS PA replied to page.  Made aware of Phos levels and new order for Potassium Phos 30 mmol order. No additional meds for anxiety, sleep or pain were ordered. Patient still NPO.  No ice chips allowed. [place], [place] C 12/01/16

## 2016-12-01 NOTE — Progress Notes (Signed)
Nutrition Follow-up  DOCUMENTATION CODES:   Morbid obesity  INTERVENTION:   TPN per Pharmacy; nutritional needs reassess post extubation (see below)  Pt at risk for refeeding syndrome due to prolonged NPO status. Follow lytes and supplement aggressively (noted Phosphorus <1.0)  If unable to trial po CL diet in next 24-48 hours, recommend trial of trickle TF   NUTRITION DIAGNOSIS:   Increased nutrient needs related to wound healing as evidenced by estimated needs.  Being addressed via nutrition support  GOAL:   Patient will meet greater than or equal to 90% of their needs  Progressing  MONITOR:   Diet advancement, Labs, I & O's, Weight trends, Skin (TPN tolerance)  REASON FOR ASSESSMENT:   Ventilator    ASSESSMENT:   Pt with hx of DM, CKD, colon cancer, and recurring abd wall seromas, admitted for surgical intervention now s/p removal of mesh from abd wall, incisional hernia repair 8/30, s/p exp lap and placement of open abd VAC for recurrent incisional hernia with SBO 9/1, s/p exp lap with closure of lateral abd fascia and reopening of midline fascia 9/2.   9/6 Extubated, doing well on 4L [place]  NPO; PICC line placed 11/28/16. TPN started 11/30/16  UOP 2450 mL in previous 24 hours, NG output 180 mL, Negative pressure wound therapy 235 mL  Labs: phosphorus <1.0 (supplementing), potassium 3.5, magnesium wdl ,CBGs 194-223 Meds: reviewed  Diet Order:  TPN (CLINIMIX-E) Adult Diet NPO time specified TPN (CLINIMIX-E) Adult  Skin:   (open abd with wound VAC)  Last BM:  8/29  Height:   Ht Readings from Last 1 Encounters:  11/23/16 5\' 4"  (1.626 m)    Weight:   Wt Readings from Last 1 Encounters:  11/23/16 258 lb (117 kg)    Ideal Body Weight:  54.5 kg  BMI:  Body mass index is 44.29 kg/m.  Estimated Nutritional Needs:   Kcal:  2100-2400 kcals  Protein:  117-136 g  Fluid:  >/= 2 L  EDUCATION NEEDS:   No education needs identified at this time  [place], [place], LDN [phone] Pager  [phone] Weekend/On-Call Pager

## 2016-12-01 NOTE — Evaluation (Signed)
Physical Therapy Evaluation Patient Details Name: Krystal Campbell MRN: [id number] DOB: Oct 14, 1960 Today's Date: 12/01/2016   History of Present Illness  PAtient is a 56 y/o female admitted with Recurrent incisional hernia, S/p Removal of mesh and repair of incisional hernia primary with transversus abdominis release, 11/23/16, S/p EXPLORATORY LAPAROTOMY AND PLACEMENT OF OPEN ABDOMEN VAC, 11/25/16, S/p  closure of lateral abdominal fascial for 20cm in length, opening of midline fascia, 11/26/16, S/p Exploratory laparotomy with closure of abdomen using  Strattice biological mesh and placement of wound VAC, 11/29/16, and acute respiratory failure following procedure requiring mechanical ventilation, was extubated 11/30/15  Clinical Impression  Patient presents with decreased independence and safety with mobility due to weakness, pain in abdomen, decreased activity tolerance, decreased bal ance and will benefit from skilled PT in the acute setting to maximize mobility and independence prior to d/c home.  Currently requires mod A of 2 for mobility and was independent prior.  Feel she may need SNF level rehab initially.    Follow Up Recommendations SNF ([place] near [place] and daughter worksin rehab there)    Equipment Recommendations  Other (comment) (TBA)    Recommendations for Other Services       Precautions / Restrictions Precautions Precautions: Fall;Other (comment) (abdominal wound with vac)      Mobility  Bed Mobility Overal bed mobility: Needs Assistance Bed Mobility: Rolling;Sidelying to Sit Rolling: Mod assist Sidelying to sit: Mod assist;+2 for physical assistance       General bed mobility comments: cues for technique, HOB made flat, assist with pad under pt to roll and assist legs off bed and to lift trunk  Transfers Overall transfer level: Needs assistance Equipment used: Rolling walker (2 wheeled) Transfers: Stand Pivot Transfers;Sit to/from Stand Sit to Stand: Max assist;+2  physical assistance         General transfer comment: lifting and lowering assistnance from bed and to chair with cues and increased time,  Able to stand steps with RW but was flexed over walker and +2 A for safety  Ambulation/Gait             General Gait Details: NT  Stairs            Wheelchair Mobility    Modified Rankin (Stroke Patients Only)       Balance Overall balance assessment: Needs assistance   Sitting balance-Leahy Scale: Fair       Standing balance-Leahy Scale: Poor                               Pertinent Vitals/Pain Pain Assessment: Faces Faces Pain Scale: Hurts even more Pain Location: abdomen with movement Pain Descriptors / Indicators: Grimacing;Operative site guarding Pain Intervention(s): Limited activity within patient's tolerance;Repositioned    Home Living Family/patient expects to be discharged to:: Skilled nursing facility Living Arrangements: Spouse/significant other               Additional Comments: spouse disabled, unable to assist pt    Prior Function Level of Independence: Independent               Hand Dominance        Extremity/Trunk Assessment   Upper Extremity Assessment Upper Extremity Assessment: Defer to OT evaluation    Lower Extremity Assessment Lower Extremity Assessment: Defer to PT evaluation;Overall [facility] for tasks assessed    Cervical / Trunk Assessment Cervical / Trunk Assessment: Other exceptions Cervical / Trunk Exceptions: flexed  posture due to abdominal wound  Communication   Communication: Other (comment);Expressive difficulties (hoarse and whispering)  Cognition Arousal/Alertness: Awake/alert Behavior During Therapy: Anxious Overall Cognitive Status: No family/caregiver present to determine baseline cognitive functioning                                        General Comments General comments (skin integrity, edema, etc.): RN informed of  mobility status, pt propped with pillows all 4 extremites    Exercises     Assessment/Plan    PT Assessment Patient needs continued PT services  PT Problem List Decreased strength;Decreased mobility;Decreased safety awareness;Decreased activity tolerance;Decreased balance;Pain       PT Treatment Interventions DME instruction;Stair training;Gait training;Therapeutic exercise;Patient/family education;Therapeutic activities    PT Goals (Current goals can be found in the Care Plan section)  Acute Rehab PT Goals Patient Stated Goal: To return to independent PT Goal Formulation: With patient Time For Goal Achievement: 12/15/16 Potential to Achieve Goals: Good    Frequency Min 3X/week   Barriers to discharge        Co-evaluation               AM-PAC PT "6 Clicks" Daily Activity  Outcome Measure Difficulty turning over in bed (including adjusting bedclothes, sheets and blankets)?: Unable Difficulty moving from lying on back to sitting on the side of the bed? : Unable Difficulty sitting down on and standing up from a chair with arms (e.g., wheelchair, bedside commode, etc,.)?: Unable Help needed moving to and from a bed to chair (including a wheelchair)?: A Lot Help needed walking in hospital room?: Total Help needed climbing 3-5 steps with a railing? : Total 6 Click Score: 7    End of Session Equipment Utilized During Treatment: Gait belt;Oxygen Activity Tolerance: Patient limited by fatigue Patient left: in chair;with call bell/phone within reach Nurse Communication: Mobility status PT Visit Diagnosis: Other abnormalities of gait and mobility (R26.89);History of falling (Z91.81)    Time: 3536-1443 PT Time Calculation (min) (ACUTE ONLY): 29 min   Charges:   PT Evaluation $PT Eval High Complexity: 1 High PT Treatments $Therapeutic Activity: 8-22 mins   PT G CodesMagda Campbell, [place] [phone] 12/01/2016   Krystal Campbell 12/01/2016, 2:27 PM

## 2016-12-01 NOTE — Progress Notes (Signed)
[facility] Progress Note Patient Name: Krystal Campbell DOB: Aug 06, 1960 MRN: [id number]   Date of Service  12/01/2016  HPI/Events of Note  SOB  eICU Interventions  Albuterol PRN     Intervention Category Major Interventions: Other:  YACOUB,WESAM 12/01/2016, 3:05 AM

## 2016-12-01 NOTE — Progress Notes (Signed)
2 Days Post-Op   Subjective/Chief Complaint: Pt extubated  Some SOB overnight Seen with WOCN    Objective: Vital signs in last 24 hours: Temp:  [98.6 F (37 C)-98.9 F (37.2 C)] 98.6 F (37 C) (09/07 0400) Pulse Rate:  [54-116] 89 (09/07 0700) Resp:  [11-27] 21 (09/07 0700) BP: (96-169)/(57-110) 155/79 (09/07 0700) SpO2:  [92 %-100 %] 97 % (09/07 0700) FiO2 (%):  [40 %] 40 % (09/06 1120) Last BM Date: 11/22/16  Intake/Output from previous day: 09/06 0701 - 09/07 0700 In: 1307.7 [I.V.:1307.7] Out: 2865 [Urine:2450; Emesis/NG output:180; Drains:235] Intake/Output this shift: No intake/output data recorded.  Incision/Wound:wound open and looks clean   Good granulation tissue  Strattice intact without signs of breakdown  Lab Results:   Recent Labs  11/30/16 0337 12/01/16 0504  WBC 7.3 10.2  HGB 8.5* 9.3*  HCT 26.8* 30.3*  PLT 228 286   BMET  Recent Labs  11/30/16 0337 12/01/16 0504  NA 138 138  K 3.5 4.5  CL 109 106  CO2 22 24  GLUCOSE 121* 481*  BUN 6 7  CREATININE 0.81 0.84  CALCIUM 7.6* 8.2*   PT/INR No results for input(s): LABPROT, INR in the last 72 hours. ABG  Recent Labs  11/29/16 0326 11/30/16 0505  PHART 7.384 7.455*  HCO3 21.3 21.4    Studies/Results: Dg Chest Port 1 View  Result Date: 12/01/2016 CLINICAL DATA:  Respiratory failure EXAM: PORTABLE CHEST 1 VIEW COMPARISON:  11/30/2016 FINDINGS: Endotracheal tube and NG tube removed. Right arm PICC tip in the SVC Improved lung volume since yesterday. Worsening airspace disease bilaterally right greater than left. No significant effusion. IMPRESSION: Endotracheal tube removed. Increased lung volume with worsening bilateral airspace disease right greater than left. Possible edema or pneumonia. Electronically Signed   By: Franchot Gallo M.D.   On: 12/01/2016 07:36   Dg Chest Port 1 View  Result Date: 11/30/2016 CLINICAL DATA:  Intubated. EXAM: PORTABLE CHEST 1 VIEW COMPARISON:  11/29/2016  FINDINGS: An endotracheal tube terminates 2 cm above the carina. An enteric tube courses into the stomach with its tip not imaged. Right PICC terminates over the lower SVC. The cardiomediastinal silhouette is unchanged. Lung volumes remain diminished with stable to slightly increased bilateral perihilar and basilar opacities no overt pulmonary edema, sizable pleural effusion, or pneumothorax is identified. IMPRESSION: Low lung volumes with stable to slightly increased perihilar and basilar lung opacities, likely atelectasis though infection is not excluded. Electronically Signed   By: Logan Bores M.D.   On: 11/30/2016 08:10    Anti-infectives: Anti-infectives    Start     Dose/Rate Route Frequency Ordered Stop   11/29/16 1500  ceFAZolin (ANCEF) IVPB 2g/100 mL premix  Status:  Discontinued     2 g 200 mL/hr over 30 Minutes Intravenous On call to O.R. 11/29/16 1443 11/29/16 1622   11/26/16 0900  cefoTEtan (CEFOTAN) 2 g in dextrose 5 % 50 mL IVPB     2 g 100 mL/hr over 30 Minutes Intravenous On call to O.R. 11/26/16 0848 11/26/16 1020   11/25/16 1545  ceFAZolin (ANCEF) IVPB 2g/100 mL premix     2 g 200 mL/hr over 30 Minutes Intravenous  Once 11/25/16 1539 11/25/16 1708   11/23/16 1730  ceFAZolin (ANCEF) IVPB 2g/100 mL premix     2 g 200 mL/hr over 30 Minutes Intravenous Every 8 hours 11/23/16 1558 11/23/16 1722   11/23/16 0600  ceFAZolin (ANCEF) 3 g in dextrose 5 % 50 mL IVPB  3 g 130 mL/hr over 30 Minutes Intravenous On call to O.R. 11/22/16 1331 11/23/16 0945      Assessment/Plan: s/p Procedure(s): EXPLORATORY LAPAROTOMY (N/A) PLACEMENT OF STRATTICE BIOLOGIC MESH APPLICATION OF WOUND VAC (N/A) LIMIT STEROID USE PLEASE   Cont wound vac   Apply mepitel as barrier Change MWF Appreciate CCM assistance with patient  Cont TNA for now  Pulmonary toilet   Advance diet once GI function returns   LOS: 8 days    Cyprian [place] A. 12/01/2016

## 2016-12-02 ENCOUNTER — Inpatient Hospital Stay (HOSPITAL_COMMUNITY): Payer: Medicare PPO

## 2016-12-02 LAB — CBC
HEMATOCRIT: 29.7 % — AB (ref 36.0–46.0)
Hemoglobin: 9.2 g/dL — ABNORMAL LOW (ref 12.0–15.0)
MCH: 28.3 pg (ref 26.0–34.0)
MCHC: 31 g/dL (ref 30.0–36.0)
MCV: 91.4 fL (ref 78.0–100.0)
Platelets: 316 10*3/uL (ref 150–400)
RBC: 3.25 MIL/uL — ABNORMAL LOW (ref 3.87–5.11)
RDW: 14.9 % (ref 11.5–15.5)
WBC: 11.6 10*3/uL — ABNORMAL HIGH (ref 4.0–10.5)

## 2016-12-02 LAB — BASIC METABOLIC PANEL
Anion gap: 6 (ref 5–15)
BUN: 11 mg/dL (ref 6–20)
CO2: 28 mmol/L (ref 22–32)
CREATININE: 0.77 mg/dL (ref 0.44–1.00)
Calcium: 8.1 mg/dL — ABNORMAL LOW (ref 8.9–10.3)
Chloride: 106 mmol/L (ref 101–111)
GFR calc Af Amer: >60 mL/min (ref >60)
GFR calc non Af Amer: >60 mL/min (ref >60)
GLUCOSE: 177 mg/dL — AB (ref 65–99)
Potassium: 3.5 mmol/L (ref 3.5–5.1)
Sodium: 140 mmol/L (ref 135–145)

## 2016-12-02 LAB — GLUCOSE, CAPILLARY
GLUCOSE-CAPILLARY: 158 mg/dL — AB (ref 65–99)
GLUCOSE-CAPILLARY: 195 mg/dL — AB (ref 65–99)
GLUCOSE-CAPILLARY: 181 mg/dL — AB (ref 65–99)
Glucose-Capillary: 139 mg/dL — ABNORMAL HIGH (ref 65–99)
Glucose-Capillary: 166 mg/dL — ABNORMAL HIGH (ref 65–99)
Glucose-Capillary: 218 mg/dL — ABNORMAL HIGH (ref 65–99)

## 2016-12-02 LAB — MAGNESIUM: Magnesium: 1.8 mg/dL (ref 1.7–2.4)

## 2016-12-02 LAB — PHOSPHORUS
PHOSPHORUS: 3.3 mg/dL (ref 2.5–4.6)
Phosphorus: 3.7 mg/dL (ref 2.5–4.6)

## 2016-12-02 MED ORDER — MAGNESIUM SULFATE 4 GM/100ML IV SOLN
4.0000 g | Freq: Once | INTRAVENOUS | Status: AC
  Administered 2016-12-02: 4 g via INTRAVENOUS
  Filled 2016-12-02: qty 100

## 2016-12-02 MED ORDER — TRACE MINERALS CR-CU-MN-SE-ZN 10-1000-500-60 MCG/ML IV SOLN
INTRAVENOUS | Status: AC
  Administered 2016-12-02: 18:00:00 via INTRAVENOUS
  Filled 2016-12-02: qty 1560

## 2016-12-02 MED ORDER — CHLORHEXIDINE GLUCONATE CLOTH 2 % EX PADS
6.0000 | MEDICATED_PAD | Freq: Every day | CUTANEOUS | Status: DC
  Administered 2016-12-02 – 2016-12-07 (×4): 6 via TOPICAL

## 2016-12-02 MED ORDER — POTASSIUM PHOSPHATES 15 MMOLE/5ML IV SOLN
10.0000 mmol | Freq: Once | INTRAVENOUS | Status: AC
  Administered 2016-12-02: 10 mmol via INTRAVENOUS
  Filled 2016-12-02: qty 3.33

## 2016-12-02 MED ORDER — CHLORHEXIDINE GLUCONATE 0.12 % MT SOLN
OROMUCOSAL | Status: AC
  Filled 2016-12-02: qty 15

## 2016-12-02 MED ORDER — POTASSIUM CHLORIDE 10 MEQ/50ML IV SOLN
10.0000 meq | INTRAVENOUS | Status: AC
  Administered 2016-12-02 (×4): 10 meq via INTRAVENOUS
  Filled 2016-12-02 (×4): qty 50

## 2016-12-02 MED ORDER — MORPHINE SULFATE (PF) 4 MG/ML IV SOLN
2.0000 mg | INTRAVENOUS | Status: DC | PRN
  Administered 2016-12-02: 2 mg via INTRAVENOUS
  Administered 2016-12-03: 4 mg via INTRAVENOUS
  Administered 2016-12-03: 2 mg via INTRAVENOUS
  Administered 2016-12-03: 4 mg via INTRAVENOUS
  Administered 2016-12-04: 2 mg via INTRAVENOUS
  Administered 2016-12-04: 4 mg via INTRAVENOUS
  Administered 2016-12-04: 2 mg via INTRAVENOUS
  Administered 2016-12-05 – 2016-12-06 (×5): 4 mg via INTRAVENOUS
  Filled 2016-12-02 (×12): qty 1

## 2016-12-02 NOTE — Progress Notes (Signed)
3 Days Post-Op   Subjective/Chief Complaint: Slept a bit better overnight with daughter in room. Pain well controlled. Coughing up some phlegm   Objective: Vital signs in last 24 hours: Temp:  [97.7 F (36.5 C)-98.5 F (36.9 C)] 98 F (36.7 C) (09/08 0800) Pulse Rate:  [69-92] 79 (09/08 1000) Resp:  [14-29] 26 (09/08 1000) BP: (113-154)/(48-123) 113/48 (09/08 1000) SpO2:  [94 %-100 %] 95 % (09/08 1000) Last BM Date: 11/22/16  Intake/Output from previous day: 09/07 0701 - 09/08 0700 In: 1266 [I.V.:1160; IV Piggyback:106] Out: 2220 [Urine:2170; Drains:50] Intake/Output this shift: Total I/O In: 110 [I.V.:110] Out: 350 [Urine:350]  Incision/Wound:wound vac in place, serosang output  Abdomen soft  Lab Results:   Recent Labs  12/01/16 0504 12/02/16 0406  WBC 10.2 11.6*  HGB 9.3* 9.2*  HCT 30.3* 29.7*  PLT 286 316   BMET  Recent Labs  12/01/16 1334 12/02/16 0406  NA 142 140  K 3.5 3.5  CL 104 106  CO2 23 28  GLUCOSE 205* 177*  BUN 10 11  CREATININE 0.88 0.77  CALCIUM 8.7* 8.1*   PT/INR No results for input(s): LABPROT, INR in the last 72 hours. ABG  Recent Labs  11/30/16 0505  PHART 7.455*  HCO3 21.4    Studies/Results: Dg Chest Port 1 View  Result Date: 12/02/2016 CLINICAL DATA:  Respiratory failure EXAM: PORTABLE CHEST 1 VIEW COMPARISON:  12/01/2016 FINDINGS: Interval improvement in bibasilar airspace disease. Negative for heart failure or effusion. Central line tip in the lower SVC. IMPRESSION: Significant improvement in bibasilar airspace disease. Electronically Signed   By: Franchot Gallo M.D.   On: 12/02/2016 07:16   Dg Chest Port 1 View  Result Date: 12/01/2016 CLINICAL DATA:  Respiratory failure EXAM: PORTABLE CHEST 1 VIEW COMPARISON:  11/30/2016 FINDINGS: Endotracheal tube and NG tube removed. Right arm PICC tip in the SVC Improved lung volume since yesterday. Worsening airspace disease bilaterally right greater than left. No significant  effusion. IMPRESSION: Endotracheal tube removed. Increased lung volume with worsening bilateral airspace disease right greater than left. Possible edema or pneumonia. Electronically Signed   By: Franchot Gallo M.D.   On: 12/01/2016 07:36    Anti-infectives: Anti-infectives    Start     Dose/Rate Route Frequency Ordered Stop   11/29/16 1500  ceFAZolin (ANCEF) IVPB 2g/100 mL premix  Status:  Discontinued     2 g 200 mL/hr over 30 Minutes Intravenous On call to O.R. 11/29/16 1443 11/29/16 1622   11/26/16 0900  cefoTEtan (CEFOTAN) 2 g in dextrose 5 % 50 mL IVPB     2 g 100 mL/hr over 30 Minutes Intravenous On call to O.R. 11/26/16 0848 11/26/16 1020   11/25/16 1545  ceFAZolin (ANCEF) IVPB 2g/100 mL premix     2 g 200 mL/hr over 30 Minutes Intravenous  Once 11/25/16 1539 11/25/16 1708   11/23/16 1730  ceFAZolin (ANCEF) IVPB 2g/100 mL premix     2 g 200 mL/hr over 30 Minutes Intravenous Every 8 hours 11/23/16 1558 11/23/16 1722   11/23/16 0600  ceFAZolin (ANCEF) 3 g in dextrose 5 % 50 mL IVPB     3 g 130 mL/hr over 30 Minutes Intravenous On call to O.R. 11/22/16 1331 11/23/16 0945      Assessment/Plan: s/p Procedure(s): EXPLORATORY LAPAROTOMY (N/A) PLACEMENT OF STRATTICE BIOLOGIC MESH APPLICATION OF WOUND VAC (N/A) LIMIT STEROID USE PLEASE   Cont wound vac   Apply mepitel as barrier Change MWF Appreciate CCM assistance with patient  Cont TNA for now  Pulmonary toilet   Advance diet once GI function returns   LOS: 9 days    Krystal Campbell 12/02/2016

## 2016-12-02 NOTE — Progress Notes (Signed)
PHARMACY - ADULT TOTAL PARENTERAL NUTRITION CONSULT NOTE   Pharmacy Consult:  TPN Indication:  SBO  Patient Measurements: Height: 5\' 4"  (162.6 cm) Weight: 258 lb (117 kg) IBW/kg (Calculated) : 54.7 TPN AdjBW (KG): 70.3 Body mass index is 44.29 kg/m.  Assessment:  30 YOF with history of abdominal wall seroma presented on 11/23/16 for removal of mesh and hernia repair.  11/25/16 CT showed recurrent hernia with SBO and patient underwent emergent ex-lap with placement of wound vac.  On 11/26/16, she had closure of the lateral abdominal fascia and opening of the midline fascia.  On 11/29/16, patient underwent ex-lap with closure of abdomen and placement of biological mesh and wound VAC.  Pharmacy consulted to manage TPN until GI function returns.  Family and patient report she was eating at 75% capacity compared to before.  GI: hx colon cancer/GERD - PPI IV.  Change wound VAC MWF.  Pre-albumin 12.2 > 6.5 d/t stress of illness.  No NG O/P, drain O/P 50 mL. PRN Zofran Endo: DM on metformin PTA - CBGs elevated 9/6-/97 d/t steroid and TPN starting at the same time.  Off steroid 9/7 >> CBGs controlled with insulin in TPN Insulin requirements in the past 24 hours:  26 units SSI + 7 units in TPN Lytes: refeeding resolving - lytes all WNL (K+ 3.5 post 57mEq but also got Lasix, Mag unchanged post 3 gm) Renal: SCr 0.77 stable, BUN WNL - UOP 0.8 ml/kg/hr, LR at 10 ml/hr Pulm: intubated post-op 9/1, extubated 9/6 to 4L [place], SOB overnight 9/7 Cards: HLD - BP elevated, HR controlled - PRN hydralazine/metoprolol, Lasix x1 on 9/7 AC/Heme: hx PE post IVC filter.  CBC stable. Hepatobil: LFTs / tbili WNL.  TG mildly elevated at 173. Neuro: anxious - PRN Fentanyl/Ativan, not getting Celexa and gabapentin. ID: afebrile, WBC up to 11.6 - not on abx Best Practices: heparin SQ TPN Access: PICC placed 11/28/16 TPN start date: 11/30/16  Nutritional Goals (per RD rec 9/7):  2100-2400 kCal and 117-136 gm protein per  day  Current Nutrition: TPN goal: Clinimix E 5/15 at 100 ml/hr + 20% ILE at 20 ml/hr x 12 hrs   Plan:  - Increase Clinimix E 5/15 to 65 ml/hr.  Advancing slowly to ensure CBGs remain controlled and while refeeding hasn't completely resolved. - Hold ILE for the first 7 days of TPN in ICU patient per ASPEN/SCCM guidelines (start date 12/07/16).  Could likely start sooner if patient is not re-intubated. - Daily multivitamin and trace elements in TPN - Continue moderate SSI Q4H + increase insulin in TPN to 16 units - KCL x 4 runs - KPhos 10 mmol IV x 1 - Mag sulfate 4gm IV x 1  - F/U AM labs, CBGs   Brooke Steinhilber D. Mina Marble, PharmD, BCPS Pager:  [phone] 12/02/2016, 8:12 AM

## 2016-12-02 NOTE — Progress Notes (Signed)
At 2240H patient called out with c/o nausea. Patient had a small episode (about 64mL) of yellow, bile-like emesis. Administered PRN 4mg  ondansetron IVP and paged MD. Verbal orders were received to continue monitoring and obtain abdominal x-ray if patient experiences another episode of emesis.   Will continue to monitor.   Naomie Dean, RN

## 2016-12-03 LAB — BASIC METABOLIC PANEL
ANION GAP: 6 (ref 5–15)
BUN: 10 mg/dL (ref 6–20)
CALCIUM: 8.1 mg/dL — AB (ref 8.9–10.3)
CO2: 28 mmol/L (ref 22–32)
Chloride: 103 mmol/L (ref 101–111)
Creatinine, Ser: 0.7 mg/dL (ref 0.44–1.00)
Glucose, Bld: 232 mg/dL — ABNORMAL HIGH (ref 65–99)
POTASSIUM: 3.9 mmol/L (ref 3.5–5.1)
SODIUM: 137 mmol/L (ref 135–145)

## 2016-12-03 LAB — MAGNESIUM: MAGNESIUM: 1.7 mg/dL (ref 1.7–2.4)

## 2016-12-03 LAB — GLUCOSE, CAPILLARY
GLUCOSE-CAPILLARY: 165 mg/dL — AB (ref 65–99)
GLUCOSE-CAPILLARY: 178 mg/dL — AB (ref 65–99)
GLUCOSE-CAPILLARY: 189 mg/dL — AB (ref 65–99)
GLUCOSE-CAPILLARY: 231 mg/dL — AB (ref 65–99)
Glucose-Capillary: 225 mg/dL — ABNORMAL HIGH (ref 65–99)
Glucose-Capillary: 220 mg/dL — ABNORMAL HIGH (ref 65–99)

## 2016-12-03 LAB — PHOSPHORUS: Phosphorus: 2.9 mg/dL (ref 2.5–4.6)

## 2016-12-03 MED ORDER — POTASSIUM PHOSPHATES 15 MMOLE/5ML IV SOLN
20.0000 mmol | Freq: Once | INTRAVENOUS | Status: AC
  Administered 2016-12-03: 20 mmol via INTRAVENOUS
  Filled 2016-12-03: qty 6.67

## 2016-12-03 MED ORDER — CHLORHEXIDINE GLUCONATE 0.12 % MT SOLN
OROMUCOSAL | Status: AC
  Filled 2016-12-03: qty 15

## 2016-12-03 MED ORDER — M.V.I. ADULT IV INJ
INJECTION | INTRAVENOUS | Status: AC
  Administered 2016-12-03: 17:00:00 via INTRAVENOUS
  Filled 2016-12-03: qty 1560

## 2016-12-03 MED ORDER — GABAPENTIN 250 MG/5ML PO SOLN
600.0000 mg | Freq: Three times a day (TID) | ORAL | Status: DC
  Administered 2016-12-03 – 2016-12-05 (×6): 600 mg via ORAL
  Filled 2016-12-03 (×8): qty 12

## 2016-12-03 MED ORDER — MAGNESIUM SULFATE 4 GM/100ML IV SOLN
4.0000 g | Freq: Once | INTRAVENOUS | Status: AC
  Administered 2016-12-03: 4 g via INTRAVENOUS
  Filled 2016-12-03: qty 100

## 2016-12-03 NOTE — NC FL2 (Signed)
[place] LEVEL OF CARE SCREENING TOOL     IDENTIFICATION  Patient Name: Krystal Campbell Birthdate: Sep 29, 1960 Sex: female Admission Date (Current Location): 11/23/2016  [facility] and [place] Number:  Herbalist and Address:  [facility] [place], [place] [address] [place], [place] [address]      Provider Number: [id number]  Attending Physician Name and Address:  Erroll Luna, MD  Relative Name and Phone Number:       Current Level of Care: Hospital Recommended Level of Care: [place] Prior Approval Number:    Date Approved/Denied:   PASRR Number:    Discharge Plan: SNF    Current Diagnoses: Patient Active Problem List   Diagnosis Date Noted  . Encounter for central line placement   . Encounter for orogastric (OG) tube placement   . Acute respiratory failure ([place])   . Abdominal wall seroma ([place]) 11/23/2016  . S/P hernia surgery 01/17/2016  . Adenocarcinoma of colon ([place]) 11/10/2014  . Obesity 11/10/2014  . Acute pulmonary embolism ([place]) 10/26/2014  . Iron deficiency anemia 10/26/2014  . Cecal lesion 10/25/2014  . Anxiety state 10/25/2014  . Absolute anemia   . GERD (gastroesophageal reflux disease) 10/10/2014  . Microcytic anemia 10/09/2014  . Diabetes ([place]) 10/09/2014  . Hypertension 10/09/2014    Orientation RESPIRATION BLADDER Height & Weight     Self, Situation, Place  O2 (2L) Continent, External catheter Weight: 258 lb (117 kg) Height:  _0  (162.6 cm)  BEHAVIORAL SYMPTOMS/MOOD NEUROLOGICAL BOWEL NUTRITION STATUS      Continent Diet (See DC summary for orders)    AMBULATORY STATUS COMMUNICATION OF NEEDS Skin   Extensive Assist Verbally Surgical wounds, Skin abrasions, Bruising   Pt is followed by the surgical team for assessment and plan of care for abd wound. Reason for Consult: First post-op Vac dressing changed; Dr Brantley Stage at the bedside to assess wound appearance. Wound type: Full thickness  post-op wound to midline abd Measurement: 14X9X6cm with undermining to wound edges to 4 cm Wound bed: White allograft material visible to inner wound bed with sutures, surrounding edges beefy red Drainage (amount, consistency, odor) Mod amt pink drainage in the lower wound and the Vac cannister, no odor Periwound: Intact skin surrounding Dressing procedure/placement/frequency: Applied nonadherent contact layer to inner wound, then 2 pieces black foam to 16m cont suction.  Pt tolerated with mod amt discomfort after pain meds were given.  Plan for dressing change on Monday.                       Personal Care Assistance Level of Assistance  Bathing, Feeding, Dressing Bathing Assistance: Maximum assistance Feeding assistance: Limited assistance Dressing Assistance: Maximum assistance     Functional Limitations Info  Sight, Hearing, Speech Sight Info: Adequate Hearing Info: Adequate Speech Info: Adequate    SPECIAL CARE FACTORS FREQUENCY  PT (By licensed PT), OT (By licensed OT)     PT Frequency: 5x a week OT Frequency: 5x a week            Contractures Contractures Info: Not present    Additional Factors Info  Code Status, Allergies Code Status Info: Full Code Allergies Info: NKA           Current Medications (12/03/2016):  This is the current hospital active medication list Current Facility-Administered Medications  Medication Dose Route Frequency Provider Last Rate Last Dose  . .Marland KitchenPN (CLINIMIX-E) Adult   Intravenous Continuous TPN  Mina Marble, Thuy D, RPH      . albuterol (PROVENTIL) (2.5 MG/3ML) 0.083% nebulizer solution 2.5 mg  2.5 mg Nebulization Q6H PRN Rush Farmer, MD   2.5 mg at 12/01/16 1307  . chlorhexidine gluconate (MEDLINE KIT) (PERIDEX) 0.12 % solution 15 mL  15 mL Mouth Rinse BID Georganna Skeans, MD   15 mL at 12/02/16 2016  . Chlorhexidine Gluconate Cloth 2 % PADS 6 each  6 each Topical Daily Cornett, [name], MD   6 each at 12/02/16 0940  . citalopram  (CELEXA) tablet 20 mg  20 mg Per Tube TID Javier Glazier, MD   20 mg at 11/30/16 1047  . diphenhydrAMINE (BENADRYL) injection 12.5 mg  12.5 mg Intravenous Q6H PRN Cornett, Thomas, MD      . gabapentin (NEURONTIN) 250 MG/5ML solution 600 mg  600 mg Per Tube Q8H Javier Glazier, MD   600 mg at 11/30/16 0636  . heparin injection 5,000 Units  5,000 Units Subcutaneous Q8H Charlesetta Garibaldi, MD   5,000 Units at 12/03/16 0510  . hydrALAZINE (APRESOLINE) injection 10 mg  10 mg Intravenous Q2H PRN Cornett, Thomas, MD      . insulin aspart (novoLOG) injection 0-15 Units  0-15 Units Subcutaneous Q4H Javier Glazier, MD   5 Units at 12/03/16 [phone]  . lactated ringers infusion   Intravenous Continuous Raylene Miyamoto, MD 10 mL/hr at 12/02/16 1900    . LORazepam (ATIVAN) injection 1 mg  1 mg Intravenous Q6H PRN Cornett, [name], MD   1 mg at 12/03/16 0820  . MEDLINE mouth rinse  15 mL Mouth Rinse q12n4p Cornett, Thomas, MD   15 mL at 12/02/16 1615  . metoprolol tartrate (LOPRESSOR) injection 5 mg  5 mg Intravenous Q6H PRN Cornett, Thomas, MD      . morphine 4 MG/ML injection 2-4 mg  2-4 mg Intravenous Q2H PRN Romana Juniper A, MD   4 mg at 12/03/16 0803  . naloxone [facility]) injection 0.4 mg  0.4 mg Intravenous PRN Cornett, [name], MD       And  . sodium chloride flush (NS) 0.9 % injection 9 mL  9 mL Intravenous PRN Cornett, Thomas, MD      . ondansetron (ZOFRAN) injection 4 mg  4 mg Intravenous Q6H PRN Erroll Luna, MD   4 mg at 12/03/16 0820  . pantoprazole (PROTONIX) injection 40 mg  40 mg Intravenous Q12H Georganna Skeans, MD   40 mg at 12/02/16 2118  . phenol (CHLORASEPTIC) mouth spray 1 spray  1 spray Mouth/Throat PRN Erroll Luna, MD   1 spray at 11/30/16 2041  . potassium PHOSPHATE 20 mmol in dextrose 5 % 500 mL infusion  20 mmol Intravenous Once Tyrone Apple, RPH 84 mL/hr at 12/03/16 0858 20 mmol at 12/03/16 0858  . promethazine (PHENERGAN) injection 12.5 mg  12.5 mg Intravenous Q6H  PRN Cornett, [name], MD   12.5 mg at 11/26/16 0125  . Racepinephrine HCl 2.25 % nebulizer solution 0.5 mL  0.5 mL Nebulization Q1H PRN Anders Simmonds, MD   0.5 mL at 11/30/16 1928  . sodium chloride flush (NS) 0.9 % injection 10-40 mL  10-40 mL Intracatheter Q12H Cornett, [name], MD   10 mL at 12/02/16 2118  . sodium chloride flush (NS) 0.9 % injection 10-40 mL  10-40 mL Intracatheter PRN Cornett, [name], MD      . TPN (CLINIMIX-E) Adult   Intravenous Continuous TPN Tyrone Apple, RPH 65 mL/hr at 12/02/16 1900  Discharge Medications: Please see discharge summary for a list of discharge medications.  Relevant Imaging Results:  Relevant Lab Results:   Additional Information SSN:  [id number]  Lilly Cove, LCSW

## 2016-12-03 NOTE — Plan of Care (Signed)
Problem: Bowel/Gastric: Goal: Gastrointestinal status for postoperative course will improve Outcome: Progressing No episodes n/v today. Pt diet advanced to clears.

## 2016-12-03 NOTE — Clinical Social Work Note (Signed)
Clinical Social Work Assessment  Patient Details  Name: Krystal Campbell MRN: [id number] Date of Birth: 04-Feb-1961  Date of referral:  12/03/16               Reason for consult:  Facility Placement                Permission sought to share information with:  Case Manager, Family Supports Permission granted to share information::    Name::      Data processing manager::     Relationship::   Daughter  Contact Information:     Housing/Transportation Living arrangements for the past 2 months:  Single Family Home Source of Information:  Patient Patient Interpreter Needed:  None Criminal Activity/Legal Involvement Pertinent to Current Situation/Hospitalization:  No - Comment as needed Significant Relationships:  Adult Children, Spouse Lives with:  Spouse Do you feel safe going back to the place where you live?  Yes Need for family participation in patient care:  Yes (Comment)  Care giving concerns: Patient admitted with Recurrent incisional hernia, S/p Removal of mesh and repair of incisional hernia primary with transversus abdominis release, 11/23/16, S/p EXPLORATORY LAPAROTOMY AND PLACEMENT OF OPEN ABDOMEN VAC, 11/25/16, S/p  closure of lateral abdominal fascial for 20cm in length, opening of midline fascia, 11/26/16, S/p Exploratory laparotomy with closure of abdomen using  Strattice biological mesh and placement of wound VAC, 11/29/16, and acute respiratory failure following procedure requiring mechanical ventilation, was extubated 11/30/15.  Patient was living at home with her husband, who she is the caregiver for prior to admission.   Social Worker assessment / plan:  LCSWA met with patient to discuss SNF placement and identify current needs. LCSWA informed patient that SNF placement was recommended by PT, patient in agreement to going. Patient stated that her daughter works in [employer] in [place], [place] and desires placement there. Patient stated that she is the primary caregiver of her husband who she  lives with due to his physical impairments. Patient also stated that she is waiting for her sister to arrive from [place], expected to arrive on Monday. LCSWA informed patient follow up would occur once daughter returned to bedside.  Plan: Will complete SNF workup with families hope to go to [facility] in [place], [place].  Employment status:   Disability Insurance information:   Psychologist, prison and probation services PT Recommendations:  [place] / Referral to community resources:  [place]  Patient/Family's Response to care:  LCSWA spoke with patient's daughter to discuss SNF placement at [facility] in [place], [place]. Daughter understanding of PT needs, in agreement to have mother placed. Daughter expressed concern with adverse reaction to patient being on Fentanyl, spoke with nurse and issues were resolved.  Patient/Family's Understanding of and Emotional Response to Diagnosis, Current Treatment, and Prognosis:  Daughter expressed concern with adverse reaction to patient being on Fentanyl, spoke with nurse and issues were resolved. Daughter fully supportive of care plan, stated that she anticipated discharge to occur on Wednesday, 12/06/16.  Emotional Assessment Appearance:   Appropriate Attitude/Demeanor/Rapport:   Accepting, thankful Affect (typically observed):  Accepting, Stable Orientation:  Oriented to Self, Oriented to  Time, Oriented to Place, Oriented to Situation Alcohol / Substance use:  Not Applicable Psych involvement (Current and /or in the community):  No (Comment)  Discharge Needs  Concerns to be addressed:    Readmission within the last 30 days:  No Current discharge risk:  None Barriers to Discharge:      Arbie Cookey  Vena Austria, LCSW 12/03/2016, 10:34 AM

## 2016-12-03 NOTE — Clinical Social Work Placement (Signed)
   CLINICAL SOCIAL WORK PLACEMENT  NOTE  Date:  12/03/2016  Patient Details  Name: Krystal Campbell MRN: [id number] Date of Birth: September 13, 1960  Clinical Social Work is seeking post-discharge placement for this patient at the [place] level of care (*CSW will initial, date and re-position this form in  chart as items are completed):  Yes   Patient/family provided with [place] Work Department's list of facilities offering this level of care within the geographic area requested by the patient (or if unable, by the patient's family).  Yes   Patient/family informed of their freedom to choose among providers that offer the needed level of care, that participate in Medicare, Medicaid or managed care program needed by the patient, have an available bed and are willing to accept the patient.  Yes   Patient/family informed of [place]'s ownership interest in [facility] and [facility], as well as of the fact that they are under no obligation to receive care at these facilities.  PASRR submitted to EDS on  (patient wanting placement in [place], no [place] necessary)     PASRR number received on       Existing PASRR number confirmed on       FL2 transmitted to all facilities in geographic area requested by pt/family on 12/03/16     FL2 transmitted to all facilities within larger geographic area on       Patient informed that his/her managed care company has contracts with or will negotiate with certain facilities, including the following:            Patient/family informed of bed offers received.  Patient chooses bed at       Physician recommends and patient chooses bed at      Patient to be transferred to   on  .  Patient to be transferred to facility by       Patient family notified on   of transfer.  Name of family member notified:        PHYSICIAN Please sign FL2     Additional Comment:     _______________________________________________ Lilly Cove, LCSW 12/03/2016, 11:24 AM

## 2016-12-03 NOTE — Progress Notes (Signed)
PHARMACY - ADULT TOTAL PARENTERAL NUTRITION CONSULT NOTE   Pharmacy Consult:  TPN Indication:  SBO  Patient Measurements: Height: 5\' 4"  (162.6 cm) Weight: 258 lb (117 kg) IBW/kg (Calculated) : 54.7 TPN AdjBW (KG): 70.3 Body mass index is 44.29 kg/m.  Assessment:  61 YOF with history of abdominal wall seroma presented on 11/23/16 for removal of mesh and hernia repair.  11/25/16 CT showed recurrent hernia with SBO and patient underwent emergent ex-lap with placement of wound vac.  On 11/26/16, she had closure of the lateral abdominal fascia and opening of the midline fascia.  On 11/29/16, patient underwent ex-lap with closure of abdomen and placement of biological mesh and wound VAC.  Pharmacy consulted to manage TPN until GI function returns.  Family and patient reported that PTA she was eating at 75% capacity compared to before.  GI: hx colon cancer/GERD - PPI IV.  Change wound VAC MWF.  Pre-albumin 12.2 > 6.5 d/t stress of illness.  Minimal NG/drain O/P, emesis x1 on 9/8. PRN Zofran Endo: DM on metformin PTA - CBGs elevated 9/6-/97 d/t steroid and TPN starting at the same time.  Off steroid 9/7.  CBGs trended up with increasing TPN on 9/8 Insulin requirements in the past 24 hours: 19 units SSI + 16 units in TPN Lytes: refeeding resolving - lytes all WNL Renal: SCr 0.7 stable, BUN WNL - UOP 0.6 ml/kg/hr, LR at 10 ml/hr Pulm: intubated post-op 9/1, extubated 9/6, now on RA Cards: HLD - BP high normal, HR controlled - PRN hydralazine/metoprolol, Lasix x1 on 9/7 AC/Heme: hx PE post IVC filter.  CBC stable. Hepatobil: LFTs / tbili WNL.  TG mildly elevated at 173. Neuro: anxious - PRN Fentanyl/Ativan/morphine, not getting Celexa and gabapentin. ID: afebrile, WBC up to 11.6 - not on abx Best Practices: heparin SQ TPN Access: PICC placed 11/28/16 TPN start date: 11/30/16  Nutritional Goals (per RD rec 9/7):  2100-2400 kCal and 117-136 gm protein per day  Current Nutrition: TPN goal: Clinimix E 5/15  at 100 ml/hr + 20% ILE at 20 ml/hr x 12 hrs   Plan:  - Continue Clinimix E 5/15 at 65 ml/hr.  Hold off on advancing d/t elevated CBGs. - Hold ILE for the first 7 days of TPN in ICU patient per ASPEN/SCCM guidelines (start date 12/07/16).  Could likely start sooner if clinical status does not deteriorate. - Daily multivitamin and trace elements in TPN - Continue moderate SSI Q4H + increase insulin in TPN to 28 units - KPhos 20 mmol IV x 1  - Repeat Mag sulfate 4gm IV x 1  - F/U AM labs, CBGs   Krystal Campbell D. Mina Marble, PharmD, BCPS Pager:  [phone] 12/03/2016, 7:44 AM

## 2016-12-04 DIAGNOSIS — T792XXD Traumatic secondary and recurrent hemorrhage and seroma, subsequent encounter: Secondary | ICD-10-CM

## 2016-12-04 DIAGNOSIS — Z86711 Personal history of pulmonary embolism: Secondary | ICD-10-CM

## 2016-12-04 DIAGNOSIS — I10 Essential (primary) hypertension: Secondary | ICD-10-CM

## 2016-12-04 DIAGNOSIS — D62 Acute posthemorrhagic anemia: Secondary | ICD-10-CM

## 2016-12-04 DIAGNOSIS — N183 Chronic kidney disease, stage 3 unspecified: Secondary | ICD-10-CM

## 2016-12-04 DIAGNOSIS — T888XXD Other specified complications of surgical and medical care, not elsewhere classified, subsequent encounter: Secondary | ICD-10-CM

## 2016-12-04 DIAGNOSIS — D72829 Elevated white blood cell count, unspecified: Secondary | ICD-10-CM

## 2016-12-04 DIAGNOSIS — F411 Generalized anxiety disorder: Secondary | ICD-10-CM

## 2016-12-04 DIAGNOSIS — E1169 Type 2 diabetes mellitus with other specified complication: Secondary | ICD-10-CM

## 2016-12-04 DIAGNOSIS — R0682 Tachypnea, not elsewhere classified: Secondary | ICD-10-CM

## 2016-12-04 DIAGNOSIS — C189 Malignant neoplasm of colon, unspecified: Secondary | ICD-10-CM

## 2016-12-04 DIAGNOSIS — G8918 Other acute postprocedural pain: Secondary | ICD-10-CM

## 2016-12-04 DIAGNOSIS — E669 Obesity, unspecified: Secondary | ICD-10-CM

## 2016-12-04 DIAGNOSIS — Z789 Other specified health status: Secondary | ICD-10-CM

## 2016-12-04 LAB — DIFFERENTIAL
BASOS PCT: 0 %
Basophils Absolute: 0 10*3/uL (ref 0.0–0.1)
EOS PCT: 2 %
Eosinophils Absolute: 0.2 10*3/uL (ref 0.0–0.7)
Lymphocytes Relative: 12 %
Lymphs Abs: 1.3 10*3/uL (ref 0.7–4.0)
MONO ABS: 0.9 10*3/uL (ref 0.1–1.0)
MONOS PCT: 8 %
NEUTROS ABS: 8.6 10*3/uL — AB (ref 1.7–7.7)
Neutrophils Relative %: 78 %

## 2016-12-04 LAB — GLUCOSE, CAPILLARY
GLUCOSE-CAPILLARY: 194 mg/dL — AB (ref 65–99)
GLUCOSE-CAPILLARY: 190 mg/dL — AB (ref 65–99)
Glucose-Capillary: 155 mg/dL — ABNORMAL HIGH (ref 65–99)
Glucose-Capillary: 166 mg/dL — ABNORMAL HIGH (ref 65–99)
Glucose-Capillary: 173 mg/dL — ABNORMAL HIGH (ref 65–99)

## 2016-12-04 LAB — COMPREHENSIVE METABOLIC PANEL
ALBUMIN: 2.1 g/dL — AB (ref 3.5–5.0)
ALK PHOS: 76 U/L (ref 38–126)
ALT: 21 U/L (ref 14–54)
AST: 28 U/L (ref 15–41)
Anion gap: 6 (ref 5–15)
BILIRUBIN TOTAL: 0.7 mg/dL (ref 0.3–1.2)
BUN: 12 mg/dL (ref 6–20)
CALCIUM: 8.4 mg/dL — AB (ref 8.9–10.3)
CO2: 28 mmol/L (ref 22–32)
CREATININE: 0.75 mg/dL (ref 0.44–1.00)
Chloride: 103 mmol/L (ref 101–111)
GFR calc Af Amer: >60 mL/min (ref >60)
GFR calc non Af Amer: >60 mL/min (ref >60)
GLUCOSE: 206 mg/dL — AB (ref 65–99)
Potassium: 4.1 mmol/L (ref 3.5–5.1)
Sodium: 137 mmol/L (ref 135–145)
TOTAL PROTEIN: 5.7 g/dL — AB (ref 6.5–8.1)

## 2016-12-04 LAB — TRIGLYCERIDES: Triglycerides: 179 mg/dL — ABNORMAL HIGH (ref <150)

## 2016-12-04 LAB — PREALBUMIN: PREALBUMIN: 10.8 mg/dL — AB (ref 18–38)

## 2016-12-04 LAB — CBC
HCT: 29.7 % — ABNORMAL LOW (ref 36.0–46.0)
Hemoglobin: 9.1 g/dL — ABNORMAL LOW (ref 12.0–15.0)
MCH: 27.9 pg (ref 26.0–34.0)
MCHC: 30.6 g/dL (ref 30.0–36.0)
MCV: 91.1 fL (ref 78.0–100.0)
PLATELETS: 321 10*3/uL (ref 150–400)
RBC: 3.26 MIL/uL — ABNORMAL LOW (ref 3.87–5.11)
RDW: 14.8 % (ref 11.5–15.5)
WBC: 11.1 10*3/uL — ABNORMAL HIGH (ref 4.0–10.5)

## 2016-12-04 LAB — PHOSPHORUS: Phosphorus: 3.4 mg/dL (ref 2.5–4.6)

## 2016-12-04 LAB — MAGNESIUM: Magnesium: 1.6 mg/dL — ABNORMAL LOW (ref 1.7–2.4)

## 2016-12-04 MED ORDER — BOOST / RESOURCE BREEZE PO LIQD
1.0000 | Freq: Three times a day (TID) | ORAL | Status: DC
  Administered 2016-12-04 – 2016-12-05 (×2): 1 via ORAL

## 2016-12-04 MED ORDER — POTASSIUM PHOSPHATES 15 MMOLE/5ML IV SOLN
10.0000 mmol | Freq: Once | INTRAVENOUS | Status: AC
  Administered 2016-12-04: 10 mmol via INTRAVENOUS
  Filled 2016-12-04: qty 3.33

## 2016-12-04 MED ORDER — MAGNESIUM SULFATE 4 GM/100ML IV SOLN
4.0000 g | Freq: Once | INTRAVENOUS | Status: AC
  Administered 2016-12-04: 4 g via INTRAVENOUS
  Filled 2016-12-04: qty 100

## 2016-12-04 MED ORDER — TRACE MINERALS CR-CU-MN-SE-ZN 10-1000-500-60 MCG/ML IV SOLN
INTRAVENOUS | Status: AC
  Administered 2016-12-04: 18:00:00 via INTRAVENOUS
  Filled 2016-12-04: qty 1560

## 2016-12-04 NOTE — Progress Notes (Signed)
5 Days Post-Op   Subjective/Chief Complaint: AWAKE AND VERY TIRED    Objective: Vital signs in last 24 hours: Temp:  [98 F (36.7 C)-99.3 F (37.4 C)] 99.3 F (37.4 C) (09/10 0800) Pulse Rate:  [71-92] 85 (09/10 0800) Resp:  [17-30] 24 (09/10 0800) BP: (118-158)/(68-100) 147/75 (09/10 0800) SpO2:  [92 %-100 %] 93 % (09/10 0800) Last BM Date: 11/22/16  Intake/Output from previous day: 09/09 0701 - 09/10 0700 In: 1131.8 [I.V.:1131.8] Out: 2475 [Urine:2450; Drains:25] Intake/Output this shift: Total I/O In: -  Out: 850 [Urine:850]  Resp: rhonchi bilaterally Cardio: regular rate and rhythm, S1, S2 normal, no murmur, click, rub or gallop Incision/Wound:VAC IN PLACE  CHANGED TODAY   Lab Results:   Recent Labs  12/02/16 0406 12/04/16 0510  WBC 11.6* 11.1*  HGB 9.2* 9.1*  HCT 29.7* 29.7*  PLT 316 321   BMET  Recent Labs  12/03/16 0500 12/04/16 0510  NA 137 137  K 3.9 4.1  CL 103 103  CO2 28 28  GLUCOSE 232* 206*  BUN 10 12  CREATININE 0.70 0.75  CALCIUM 8.1* 8.4*   PT/INR No results for input(s): LABPROT, INR in the last 72 hours. ABG No results for input(s): PHART, HCO3 in the last 72 hours.  Invalid input(s): PCO2, PO2  Studies/Results: No results found.  Anti-infectives: Anti-infectives    Start     Dose/Rate Route Frequency Ordered Stop   11/29/16 1500  ceFAZolin (ANCEF) IVPB 2g/100 mL premix  Status:  Discontinued     2 g 200 mL/hr over 30 Minutes Intravenous On call to O.R. 11/29/16 1443 11/29/16 1622   11/26/16 0900  cefoTEtan (CEFOTAN) 2 g in dextrose 5 % 50 mL IVPB     2 g 100 mL/hr over 30 Minutes Intravenous On call to O.R. 11/26/16 0848 11/26/16 1020   11/25/16 1545  ceFAZolin (ANCEF) IVPB 2g/100 mL premix     2 g 200 mL/hr over 30 Minutes Intravenous  Once 11/25/16 1539 11/25/16 1708   11/23/16 1730  ceFAZolin (ANCEF) IVPB 2g/100 mL premix     2 g 200 mL/hr over 30 Minutes Intravenous Every 8 hours 11/23/16 1558 11/23/16 1722   11/23/16 0600  ceFAZolin (ANCEF) 3 g in dextrose 5 % 50 mL IVPB     3 g 130 mL/hr over 30 Minutes Intravenous On call to O.R. 11/22/16 1331 11/23/16 0945      Assessment/Plan: s/p Procedure(s): EXPLORATORY LAPAROTOMY (N/A) PLACEMENT OF STRATTICE BIOLOGIC MESH APPLICATION OF WOUND VAC (N/A) Patient Active Problem List   Diagnosis Date Noted  . Encounter for central line placement   . Encounter for orogastric (OG) tube placement   . Acute respiratory failure ([place])   . Abdominal wall seroma ([place]) 11/23/2016  . S/P hernia surgery 01/17/2016  . Adenocarcinoma of colon ([place]) 11/10/2014  . Obesity 11/10/2014  . Acute pulmonary embolism ([place]) 10/26/2014  . Iron deficiency anemia 10/26/2014  . Cecal lesion 10/25/2014  . Anxiety state 10/25/2014  . Absolute anemia   . GERD (gastroesophageal reflux disease) 10/10/2014  . Microcytic anemia 10/09/2014  . Diabetes ([place]) 10/09/2014  . Hypertension 10/09/2014     To floor today Keep on clears  PT consult  Rehab consult Get out of bed and ambulate  Will wean TNA once po intake is better   LOS: 11 days    Micai Apolinar A. 12/04/2016

## 2016-12-04 NOTE — Progress Notes (Signed)
   12/04/16 1000  Clinical Encounter Type  Visited With Patient  Visit Type Follow-up  Consult/Referral To Chaplain  Spiritual Encounters  Spiritual Needs Prayer  Stress Factors  Patient Stress Factors Not reviewed  Family Stress Factors Not reviewed  Folowed up from last week with patient to see how she has progressed.  The patient is being moved to a new unit.  The nurse was in the process of moving the patient upon my arrival.  Patient was alert and responsive.

## 2016-12-04 NOTE — Progress Notes (Signed)
Nutrition Follow-up  DOCUMENTATION CODES:   Morbid obesity  INTERVENTION:   TPN per Pharmacy  Currently:  Clinimix E 5/15 at 65 ml/hr.  Hold off on advancing d/t elevated CBGs and lytes. - Hold ILE for the first 7 days of TPN in ICU patient per ASPEN/SCCM guidelines (start date 12/07/16) -provides 1108kcal/day (53% estimated needs) and 78g/day protein (67% estimated needs)   If unable to tolerate CL diet in next 24-48 hours, recommend post-pyloric Cortrak tube and trial trickle TF  Daily weights  NUTRITION DIAGNOSIS:   Increased nutrient needs related to wound healing as evidenced by increased estimated needs from protein.  Being addressed via nutrition support  GOAL:   Patient will meet greater than or equal to 90% of their needs  Progressing  MONITOR:   Diet advancement, Labs, I & O's, Weight trends, Skin (TPN tolerance)  ASSESSMENT:   Pt with hx of DM, CKD, colon cancer, and recurring abd wall seromas, admitted for surgical intervention now s/p removal of mesh from abd wall, incisional hernia repair 8/30, s/p exp lap and placement of open abd VAC for recurrent incisional hernia with SBO 9/1, s/p exp lap with closure of lateral abd fascia and reopening of midline fascia 9/2.   Met with pt in room today. Pt initiated on clear liquid diet 9/9 but has not been able to tolerate it thus far. Pt eating a bite of jello/popcicle here and there but continues to have nausea and vomiting. If pt unable to tolerate clear liquids in 1-2 days recommend post-pyloric Cortrak tube and initiate trickle tube feeds. RD will order Boost Breeze for pt to sip on. Per chart, pt has not been weighed since admit; recommend daily weights as pt on TPN. Pt has not been advanced to TPN goal rate and is only meeting ~60% estimated needs currently.    Medications reviewed and include: celexa, heparin, insulin, protonix, morphine   Labs reviewed: K 4.1 wnl, Ca 8.4(L) adj. 9.92 wnl, Mg 1.6(L), alb  2.1(L) Prealbumin- 10.8(L) Triglycerides- 179 Wbc- 11.1(H), Hgb 9.1(L), Hct 29.7(L) cbgs- 177, 232, 206 x 48 hrs AIC 6.9- 8/27   Diet Order:  .TPN (CLINIMIX-E) Adult Diet clear liquid Room service appropriate? Yes; Fluid consistency: Thin .TPN (CLINIMIX-E) Adult  Skin:   (open abd with wound VAC)  Last BM:  8/29  Height:   Ht Readings from Last 1 Encounters:  11/23/16 '5\' 4"'  (1.626 m)    Weight:   Wt Readings from Last 1 Encounters:  11/23/16 258 lb (117 kg)    Ideal Body Weight:  54.5 kg  BMI:  Body mass index is 44.29 kg/m.  Estimated Nutritional Needs:   Kcal:  2100-2400 kcals  Protein:  117-136 g  Fluid:  >/= 2 L  EDUCATION NEEDS:   No education needs identified at this time  Koleen Distance MS, RD, [place] Pager #- [phone] After Hours Pager: [phone]

## 2016-12-04 NOTE — Consult Note (Signed)
Physical Medicine and Rehabilitation Consult   Reason for Consult: Debility  Referring Physician: Dr. Brantley Stage   HPI: Krystal Campbell is a 56 y.o. female with history of T2DM, CKD, colon cancer, PE,  Chronic abdominal wall seroma with recurrence. She was admitted on 11/23/16 for removal of mesh with repair of incisional hernia by Dr. Brantley Stage. Post op course complicated by recurrent incsional  Hernia with small bowel obstruction requiring multiple surgical procedures--last on 11/29/16 for exp lap with closure and placement of wound VAC. Extubated without difficulty but has had issue with N/V requiring TPN, leucocytosis, ABLA, anxiety as well as fatigue. PT evaluation done this weekend revealing debility with impairments in mobility. CIR recommended by MD for follow up therapy.   She sat up in a chair for 2 hours yesterday and longer on Friday.  Daughter works at [employer] in [place] and patient would prefer to go there as closer to family.    Review of Systems  Constitutional: Positive for malaise/fatigue. Negative for fever.  HENT: Negative for hearing loss and tinnitus.   Eyes: Positive for blurred vision (wears glasses) and photophobia.  Respiratory: Negative for cough and shortness of breath.   Cardiovascular: Negative for chest pain and palpitations.  Gastrointestinal: Positive for abdominal pain, heartburn and nausea.  Musculoskeletal: Positive for falls (last couple of months ago) and myalgias.  Skin: Negative for rash.  Neurological: Positive for dizziness (at times), weakness and headaches.  Psychiatric/Behavioral: Positive for depression. The patient has insomnia.   All other systems reviewed and are negative.     Past Medical History:  Diagnosis Date  . Anemia   . Anxiety   . Arthritis    in knees  . Cancer ([place])    colon - 2016  . Carpal tunnel syndrome of right wrist   . Chronic kidney disease    has 1/3 of right kidney - urethra was crushed (back in 1970)  . Chronic  pain syndrome   . Depression   . Diabetes mellitus without complication ([place])    dx back in 2000  . Diverticulosis   . GERD (gastroesophageal reflux disease)   . History of blood transfusion    10/09/14  . History of hiatal hernia   . Hyperlipidemia   . Incisional hernia   . Pneumonia   . Pulmonary embolism ([place])    same time they found cancer in 2016  2 blood clots, one in each lung  . Wears glasses     Past Surgical History:  Procedure Laterality Date  . ABDOMINAL HYSTERECTOMY    . APPENDECTOMY    . APPLICATION OF WOUND VAC N/A 11/29/2016   Procedure: APPLICATION OF WOUND VAC;  Surgeon: Erroll Luna, MD;  Location: [facility];  Service: General;  Laterality: N/A;  . COLONOSCOPY WITH PROPOFOL Left 10/30/2014   Procedure: COLONOSCOPY WITH PROPOFOL;  Surgeon: Ronald Lobo, MD;  Location: [facility] ENDOSCOPY;  Service: Endoscopy;  Laterality: Left;  . DILATION AND CURETTAGE OF UTERUS    . ESOPHAGOGASTRODUODENOSCOPY (EGD) WITH PROPOFOL Left 10/30/2014   Procedure: ESOPHAGOGASTRODUODENOSCOPY (EGD) WITH PROPOFOL;  Surgeon: Ronald Lobo, MD;  Location: [facility] ENDOSCOPY;  Service: Endoscopy;  Laterality: Left;  . EXCISION OF MESH N/A 11/23/2016   Procedure: REMOVAL OF MESH FROM ABDOMINAL WALL;  Surgeon: Erroll Luna, MD;  Location: [place];  Service: General;  Laterality: N/A;  . INCISION AND DRAINAGE ABSCESS N/A 07/06/2016   Procedure: DRAINAGE ABDOMINAL WALL SEROMA;  Surgeon: Erroll Luna, MD;  Location: [facility];  Service: General;  Laterality: N/A;  . INCISION AND DRAINAGE ABSCESS N/A 08/09/2016   Procedure: DRAINAGE OF ABDOMINAL WALL SEROMA;  Surgeon: Erroll Luna, MD;  Location: [facility];  Service: General;  Laterality: N/A;  . INCISIONAL HERNIA REPAIR N/A 01/17/2016   Procedure: INCISIONAL HERNIA REPAIR;  Surgeon: Erroll Luna, MD;  Location: [facility];  Service: General;  Laterality: N/A;  . [place] N/A 11/23/2016    Procedure: HERNIA REPAIR INCISIONAL;  Surgeon: Erroll Luna, MD;  Location: [facility];  Service: General;  Laterality: N/A;  . INSERTION OF MESH N/A 01/17/2016   Procedure: INSERTION OF MESH;  Surgeon: Erroll Luna, MD;  Location: [place];  Service: General;  Laterality: N/A;  . INSERTION OF MESH  11/29/2016   Procedure: [place];  Surgeon: Erroll Luna, MD;  Location: [place];  Service: General;;  . IR GENERIC HISTORICAL  06/19/2016   IR SCLEROTHERAPY OF A FLUID COLLECTION 06/19/2016 Marybelle Killings, MD [facility]-INTER[facility]  . IR RADIOLOGIST EVAL & MGMT  06/13/2016  . IR RADIOLOGIST EVAL & MGMT  06/27/2016  . IVC FILTER PLACEMENT ([facility] HX)    . IVC filter removed    . JOINT REPLACEMENT  4/15   Total right knee replacement; left knee scoped  . KIDNEY SURGERY     when patient was a teenager  . LAPAROSCOPIC PARTIAL COLECTOMY N/A 11/04/2014   Procedure: LAPAROSCOPIC PARTIAL COLECTOMY POSSIBLE OPEN;  Surgeon: Erroll Luna, MD;  Location: [place];  Service: General;  Laterality: N/A;  . LAPAROTOMY N/A 11/25/2016   Procedure: EXPLORATORY LAPAROTOMY AND PLACEMENT OF OPEN ABDOMEN VAC;  Surgeon: Georganna Skeans, MD;  Location: [place];  Service: General;  Laterality: N/A;  . LAPAROTOMY N/A 11/26/2016   Procedure: EXPLORATORY LAPAROTOMY, CLOSURE OF LATERAL ABDOMINAL FASCIA  AND REOPENING OF MIDLINE FASCIA;  Surgeon: Kinsinger, Arta Bruce, MD;  Location: [place];  Service: General;  Laterality: N/A;  . LAPAROTOMY N/A 11/29/2016   Procedure: EXPLORATORY LAPAROTOMY;  Surgeon: Erroll Luna, MD;  Location: [facility];  Service: General;  Laterality: N/A;  . Left knee arthroscopy    . PORT-A-CATH REMOVAL Right 03/07/2016   Procedure: REMOVAL PORT-A-CATH;  Surgeon: Erroll Luna, MD;  Location: [place];  Service: General;  Laterality: Right;  . PORTACATH PLACEMENT Right 12/22/2014   Procedure: INSERTION PORT-A-CATH;  Surgeon: Erroll Luna, MD;  Location: [place] [facility];   Service: General;  Laterality: Right;  . REPLACEMENT TOTAL KNEE    . TUBAL LIGATION      Family History  Problem Relation Age of Onset  . Healthy Mother        no clotting issues in either parents  . Healthy Father   . Cancer Other     Social History:  Married--independent and uses walker at times.  Is disabled due to multiple medical/knee  issues but caregiver for husband (blindness, charcot feet --needs physical assistance at times). No local family but friends assist. She reports that she quit smoking about 48 years ago. Her smoking use included Cigarettes. She has a 5.00 pack-year smoking history. She has never used smokeless tobacco. She reports that she drinks alcohol. She reports that she uses drugs, including Marijuana.    Allergies: No Known Allergies    Medications Prior to Admission  Medication Sig Dispense Refill  . aspirin EC 81 MG tablet Take 81 mg by mouth daily.    . Black Cohosh 40 MG CAPS Take 40  mg by mouth 2 (two) times daily.    . citalopram (CELEXA) 20 MG tablet Take 20 mg by mouth 3 (three) times daily.    Marland Kitchen docusate sodium (COLACE) 100 MG capsule Take 100 mg by mouth 2 (two) times daily.    Marland Kitchen FIBER PO Take 1 tablet by mouth 2 (two) times daily.    Marland Kitchen gabapentin (NEURONTIN) 300 MG capsule Take 900 mg by mouth 3 (three) times daily.    . Melatonin 10 MG CAPS Take 20 mg by mouth at bedtime.     . metFORMIN (GLUCOPHAGE) 500 MG tablet Take 500 mg by mouth 2 (two) times daily with a meal.    . pantoprazole (PROTONIX) 40 MG tablet Take 1 tablet (40 mg total) by mouth 2 (two) times daily. 60 tablet 1  . vitamin B-12 (CYANOCOBALAMIN) 500 MCG tablet Take 500 mcg by mouth daily.    Marland Kitchen ibuprofen (ADVIL,MOTRIN) 800 MG tablet Take 1 tablet (800 mg total) by mouth every 8 (eight) hours as needed. (Patient not taking: Reported on 11/16/2016) 30 tablet 0  . oxyCODONE (OXY IR/ROXICODONE) 5 MG immediate release tablet Take 1-2 tablets (5-10 mg total) by mouth every 6 (six) hours as  needed for severe pain. (Patient not taking: Reported on 11/16/2016) 30 tablet 0  . vitamin B-12 1000 MCG tablet Take 1 tablet (1,000 mcg total) by mouth daily. (Patient not taking: Reported on 11/16/2016) 30 tablet 0    Home: Home Living Family/patient expects to be discharged to:: Skilled nursing facility Living Arrangements: Spouse/significant other Additional Comments: spouse disabled, unable to assist pt  Functional History: Prior Function Level of Independence: Independent Functional Status:  Mobility: Bed Mobility Overal bed mobility: Needs Assistance Bed Mobility: Rolling, Sidelying to Sit Rolling: Mod assist Sidelying to sit: Mod assist, +2 for physical assistance General bed mobility comments: cues for technique, HOB made flat, assist with pad under pt to roll and assist legs off bed and to lift trunk Transfers Overall transfer level: Needs assistance Equipment used: Rolling walker (2 wheeled) Transfers: Stand Pivot Transfers, Sit to/from Stand Sit to Stand: Max assist, +2 physical assistance General transfer comment: lifting and lowering assistnance from bed and to chair with cues and increased time,  Able to stand steps with RW but was flexed over walker and +2 A for safety Ambulation/Gait General Gait Details: NT    ADL:    Cognition: Cognition Overall Cognitive Status: No family/caregiver present to determine baseline cognitive functioning Orientation Level: Oriented X4 Cognition Arousal/Alertness: Awake/alert Behavior During Therapy: Anxious Overall Cognitive Status: No family/caregiver present to determine baseline cognitive functioning   Blood pressure (!) 146/81, pulse 94, temperature 99.3 F (37.4 C), temperature source Oral, resp. rate (!) 25, height 5\' 4"  (1.626 m), weight 117 kg (258 lb), SpO2 96 %. Physical Exam  Nursing note and vitals reviewed. Constitutional: She is oriented to person, place, and time. She appears well-developed and  well-nourished.  Obese female. Kept eyes closed due to fatigue as well as photophobia.   HENT:  Head: Normocephalic and atraumatic.  Eyes: Pupils are equal, round, and reactive to light. Conjunctivae and EOM are normal.  Neck: Normal range of motion. Neck supple.  Cardiovascular: Normal rate and regular rhythm.   Respiratory: Effort normal and breath sounds normal. Stridor present.  GI: Soft. Bowel sounds are normal. She exhibits no distension. There is tenderness. There is no rebound.  Dressing and VAC in place on abdomen.   Musculoskeletal: She exhibits edema and tenderness.  Neurological: She  is alert and oriented to person, place, and time.  Speech clear.  Able to follow commands without difficulty.  Motor: 4/5 grossly throughout Sensation intact to light touch  Skin: Skin is warm and dry.  Psychiatric: Judgment normal. Her affect is blunt. She is slowed. Cognition and memory are normal. She exhibits a depressed mood.    Results for orders placed or performed during the hospital encounter of 11/23/16 (from the past 24 hour(s))  Glucose, capillary     Status: Abnormal   Collection Time: 12/03/16 11:44 AM  Result Value Ref Range   Glucose-Capillary 220 (H) 65 - 99 mg/dL  Glucose, capillary     Status: Abnormal   Collection Time: 12/03/16  3:13 PM  Result Value Ref Range   Glucose-Capillary 189 (H) 65 - 99 mg/dL  Glucose, capillary     Status: Abnormal   Collection Time: 12/03/16  8:12 PM  Result Value Ref Range   Glucose-Capillary 165 (H) 65 - 99 mg/dL  Glucose, capillary     Status: Abnormal   Collection Time: 12/04/16 12:20 AM  Result Value Ref Range   Glucose-Capillary 173 (H) 65 - 99 mg/dL  Glucose, capillary     Status: Abnormal   Collection Time: 12/04/16  4:06 AM  Result Value Ref Range   Glucose-Capillary 194 (H) 65 - 99 mg/dL  Comprehensive metabolic panel     Status: Abnormal   Collection Time: 12/04/16  5:10 AM  Result Value Ref Range   Sodium 137 135 - 145  mmol/L   Potassium 4.1 3.5 - 5.1 mmol/L   Chloride 103 101 - 111 mmol/L   CO2 28 22 - 32 mmol/L   Glucose, Bld 206 (H) 65 - 99 mg/dL   BUN 12 6 - 20 mg/dL   Creatinine, Ser 0.75 0.44 - 1.00 mg/dL   Calcium 8.4 (L) 8.9 - 10.3 mg/dL   Total Protein 5.7 (L) 6.5 - 8.1 g/dL   Albumin 2.1 (L) 3.5 - 5.0 g/dL   AST 28 15 - 41 U/L   ALT 21 14 - 54 U/L   Alkaline Phosphatase 76 38 - 126 U/L   Total Bilirubin 0.7 0.3 - 1.2 mg/dL   GFR calc non Af Amer >60 >60 mL/min   GFR calc Af Amer >60 >60 mL/min   Anion gap 6 5 - 15  Magnesium     Status: Abnormal   Collection Time: 12/04/16  5:10 AM  Result Value Ref Range   Magnesium 1.6 (L) 1.7 - 2.4 mg/dL  Phosphorus     Status: None   Collection Time: 12/04/16  5:10 AM  Result Value Ref Range   Phosphorus 3.4 2.5 - 4.6 mg/dL  CBC     Status: Abnormal   Collection Time: 12/04/16  5:10 AM  Result Value Ref Range   WBC 11.1 (H) 4.0 - 10.5 K/uL   RBC 3.26 (L) 3.87 - 5.11 MIL/uL   Hemoglobin 9.1 (L) 12.0 - 15.0 g/dL   HCT 29.7 (L) 36.0 - 46.0 %   MCV 91.1 78.0 - 100.0 fL   MCH 27.9 26.0 - 34.0 pg   MCHC 30.6 30.0 - 36.0 g/dL   RDW 14.8 11.5 - 15.5 %   Platelets 321 150 - 400 K/uL  Differential     Status: Abnormal   Collection Time: 12/04/16  5:10 AM  Result Value Ref Range   Neutrophils Relative % 78 %   Neutro Abs 8.6 (H) 1.7 - 7.7 K/uL   Lymphocytes Relative  12 %   Lymphs Abs 1.3 0.7 - 4.0 K/uL   Monocytes Relative 8 %   Monocytes Absolute 0.9 0.1 - 1.0 K/uL   Eosinophils Relative 2 %   Eosinophils Absolute 0.2 0.0 - 0.7 K/uL   Basophils Relative 0 %   Basophils Absolute 0.0 0.0 - 0.1 K/uL  Prealbumin     Status: Abnormal   Collection Time: 12/04/16  5:10 AM  Result Value Ref Range   Prealbumin 10.8 (L) 18 - 38 mg/dL  Triglycerides     Status: Abnormal   Collection Time: 12/04/16  5:10 AM  Result Value Ref Range   Triglycerides 179 (H) <150 mg/dL  Glucose, capillary     Status: Abnormal   Collection Time: 12/04/16  8:23 AM    Result Value Ref Range   Glucose-Capillary 190 (H) 65 - 99 mg/dL   No results found.  Assessment/Plan: Diagnosis: Debility  Labs independently reviewed.  Records reviewed and summated above.  1. Does the need for close, 24 hr/day medical supervision in concert with the patient's rehab needs make it unreasonable for this patient to be served in a less intensive setting? Yes  2. Co-Morbidities requiring supervision/potential complications: T2 DM (Monitor in accordance with exercise and adjust meds as necessary), CKD (avoid nephrotoxic meds), colon cancer, PE, chronic abdominal wall seroma, TPN (d/ce when appropriate), leukocytosis (cont to monitor for signs and symptoms of infection, further workup if indicated), ABLA (transfuse if necessary to ensure appropriate perfusion for increased activity tolerance), anxiety (ensure anxiety and resulting apprehension do not limit functional progress; consider prn medications if warranted), tachypnea (monitor RR and O2 Sats with increased physical exertion), reactive HTN (monitor and provide prns in accordance with increased physical exertion and pain), post-op pain (Biofeedback training with therapies to help reduce reliance on opiate pain medications, monitor pain control during therapies, and sedation at rest and titrate to maximum efficacy to ensure participation and gains in therapies) 3. Due to safety, skin/wound care, disease management, pain management and patient education, does the patient require 24 hr/day rehab nursing? Yes 4. Does the patient require coordinated care of a physician, rehab nurse, PT (1-2 hrs/day, 5 days/week) and OT (1-2 hrs/day, 5 days/week) to address physical and functional deficits in the context of the above medical diagnosis(es)? Yes Addressing deficits in the following areas: balance, endurance, locomotion, strength, transferring, bathing, dressing, toileting and psychosocial support 5. Can the patient actively participate in  an intensive therapy program of at least 3 hrs of therapy per day at least 5 days per week? Yes 6. The potential for patient to make measurable gains while on inpatient rehab is excellent 7. Anticipated functional outcomes upon discharge from inpatient rehab are supervision and min assist  with PT, supervision and min assist with OT, n/a with SLP. 8. Estimated rehab length of stay to reach the above functional goals is: 14-18 days. 9. Anticipated D/C setting: Other 10. Anticipated post D/C treatments: SNF 11. Overall Rehab/Functional Prognosis: good  RECOMMENDATIONS: This patient's condition is appropriate for continued rehabilitative care in the following setting: Pt would prefer to go to SNF where her daughter works.  Believes this is reasonable, recommend SNF. Patient has agreed to participate in recommended program. Yes Note that insurance prior authorization may be required for reimbursement for recommended care.  Comment: Rehab Admissions Coordinator to follow up.  Delice Lesch, MD, [place], [place] 12/04/2016

## 2016-12-04 NOTE — Progress Notes (Signed)
PHARMACY - ADULT TOTAL PARENTERAL NUTRITION CONSULT NOTE   Pharmacy Consult:  TPN Indication:  SBO  Patient Measurements: Height: 5\' 4"  (162.6 cm) Weight: 258 lb (117 kg) IBW/kg (Calculated) : 54.7 TPN AdjBW (KG): 70.3 Body mass index is 44.29 kg/m.  Assessment:  13 YOF with history of abdominal wall seroma presented on 11/23/16 for removal of mesh and hernia repair.  11/25/16 CT showed recurrent hernia with SBO and patient underwent emergent ex-lap with placement of wound vac.  On 11/26/16, she had closure of the lateral abdominal fascia and opening of the midline fascia.  On 11/29/16, patient underwent ex-lap with closure of abdomen and placement of biological mesh and wound VAC.  Pharmacy consulted to manage TPN until GI function returns.  Family and patient reported that PTA she was eating at 75% capacity compared to before.  GI: hx colon cancer/GERD - PPI IV.  Change wound VAC MWF.  Pre-albumin up at 10.8.  Minimal NG/drain O/P, emesis x1 on 9/8. PRN Zofran Endo: DM on metformin PTA - CBGs 165-206 since new TPN hung at 1800. Off steroids since 9/7.  Insulin requirements in the past 12 hours: 9 units SSI + 28 units in TPN Lytes: refeeding resolving - K 4.1 and Phos 3.4 after KPhos 7mmol x1 on 9/9, Mg down at 1.6 despite 4g on 9/8 and 9/9, Phos wnl. CoCa 9.92. Renal: SCr 0.75 stable, BUN WNL - UOP 0.6 ml/kg/hr, LR at 10 ml/hr Pulm: intubated post-op 9/1, extubated 9/6, now on RA Cards: HLD - BP high normal, HR controlled - PRN hydralazine/metoprolol, Lasix x1 on 9/7 AC/Heme: hx PE post IVC filter.  CBC stable. Hepatobil: LFTs / tbili WNL.  TG mildly elevated at 179. Neuro: anxious - PRN Fentanyl/Ativan/morphine, Celexa and gabapentin. ID: afebrile, WBC trending down at 11.1 - not on abx Best Practices: heparin SQ TPN Access: PICC placed 11/28/16 TPN start date: 11/30/16  Nutritional Goals (per RD rec 9/7):  2100-2400 kCal and 117-136 gm protein per day  Current Nutrition: TPN,  clears goal: Clinimix E 5/15 at 100 ml/hr + 20% ILE at 20 ml/hr x 12 hrs   Plan:  - Continue Clinimix E 5/15 at 65 ml/hr.  Hold off on advancing d/t elevated CBGs and lytes. - Hold ILE for the first 7 days of TPN in ICU patient per ASPEN/SCCM guidelines (start date 12/07/16).  Could likely start sooner if clinical status does not deteriorate. - Daily multivitamin and trace elements in TPN - Continue moderate SSI Q4H + increase insulin in TPN to 34 units - KPhos 10 mmol IV x 1  - Repeat Mag sulfate 4gm IV x 1  - F/U AM labs, CBGs   Sloan Leiter, PharmD, BCPS Clinical Pharmacist Clinical phone 12/04/2016 until 3:30PM- #[phone] After hours, please call [phone] 12/04/2016, 9:19 AM

## 2016-12-04 NOTE — Consult Note (Addendum)
[facility] Nurse wound consult note Pt is followed by the surgical team for plan of care for abd wound. Reason for Consult: Vac dressing change Wound type: Full thickness post-op wound to midline abd Wound bed: White allograft material visible to inner wound bed with sutures, surrounding wound edges beefy red Drainage (amount, consistency, odor) Mod amt pink drainage in the lower wound and the Vac cannister, some odor Periwound: Intact skin surrounding Dressing procedure/placement/frequency: Removed previous 2 pieces of Vac sponge and nonadherent layer, then applied new nonadherent contact layer to inner wound, then 1 piece black foam to 180mm cont suction.  Pt tolerated with mod amt discomfort after pain meds were given. WOC will plan to change dressings on M/W/F Julien Girt MSN, [place], [place], [place], [place]

## 2016-12-04 NOTE — Progress Notes (Signed)
Pt prefers SNF where her daughter works. We will sign off at this time. [phone]

## 2016-12-04 NOTE — Progress Notes (Signed)
Physical Therapy Treatment Patient Details Name: Krystal Campbell MRN: [id number] DOB: [birth year] Today's Date: 12/04/2016    History of Present Illness PAtient is a 56 y/o female admitted with Recurrent incisional hernia, S/p Removal of mesh and repair of incisional hernia primary with transversus abdominis release, 11/23/16, S/p EXPLORATORY LAPAROTOMY AND PLACEMENT OF OPEN ABDOMEN VAC, 11/25/16, S/p  closure of lateral abdominal fascial for 20cm in length, opening of midline fascia, 11/26/16, S/p Exploratory laparotomy with closure of abdomen using  Strattice biological mesh and placement of wound VAC, 11/29/16, and acute respiratory failure following procedure requiring mechanical ventilation, was extubated 11/30/15    PT Comments    Patient progressing toward PT goals. Patient initially did not want to participate in therapy today, but agreed after encouragement . Patient was able to amb. short distances with rolling walker but was limited by L knee pain and fatigue. After a seated rest break, patient was able to continue with therapy. Patient will benefit from continued therapy addressing gait mobility and endurance as well as negotiating turns with walker.  Will continue to follow to maximize potential for mobility.  Follow Up Recommendations  SNF     Equipment Recommendations  Other (comment) (TBA)    Recommendations for Other Services       Precautions / Restrictions Precautions Precautions: Fall;Other (comment) (Abdominal wound with vac) Restrictions Weight Bearing Restrictions: No    Mobility  Bed Mobility Overal bed mobility: Needs Assistance Bed Mobility: Rolling;Sidelying to Sit Rolling: Mod assist Sidelying to sit: Mod assist;+2 for physical assistance;Min assist       General bed mobility comments: When rolling into sidelying, patient required mod assist which was provided using bed pad to assist with roll. Patient required mod assist at trunk due to decreased ability to  utilize abdominal strength. Patient also required min assistance with moving B LE off of bed.  Transfers Overall transfer level: Needs assistance Equipment used: Rolling walker (2 wheeled) Transfers: Sit to/from Stand Sit to Stand: Mod assist;+2 physical assistance         General transfer comment: Patient required assistance to gain enough forward lean to get to standing. Patient was cued to use UE for assistance when transferring from sit to stand and vice versa in order.   Ambulation/Gait Ambulation/Gait assistance: Min assist Ambulation Distance (Feet): 6 Feet (+6) Assistive device: Rolling walker (2 wheeled) Gait Pattern/deviations: Step-to pattern;Decreased step length - left;Decreased step length - right Gait velocity: Decreased Gait velocity interpretation: Below normal speed for age/gender General Gait Details: Patient amb. at a slow pace with min assist from therapist to assist with maneuvering and turning walker around tight spaces. PT also managed lines for easier amb.   Stairs            Wheelchair Mobility    Modified Rankin (Stroke Patients Only)       Balance Overall balance assessment: Needs assistance Sitting-balance support: Bilateral upper extremity supported Sitting balance-Leahy Scale: Fair     Standing balance support: Bilateral upper extremity supported Standing balance-Leahy Scale: Poor                              Cognition Arousal/Alertness: Awake/alert Behavior During Therapy: WFL for tasks assessed/performed Overall Cognitive Status: Within Functional Limits for tasks assessed  General Comments: Patient was reluctant to begin PT today, however, patient agreed after encouragement.      Exercises      General Comments        Pertinent Vitals/Pain Pain Assessment: 0-10 Pain Score: 8  Pain Location: abdomen with movement Pain Descriptors / Indicators:  Grimacing;Operative site guarding Pain Intervention(s): Limited activity within patient's tolerance;Monitored during session;Repositioned    Home Living                      Prior Function            PT Goals (current goals can now be found in the care plan section) Acute Rehab PT Goals Patient Stated Goal: To return to independent PT Goal Formulation: With patient Time For Goal Achievement: 12/15/16 Potential to Achieve Goals: Good Progress towards PT goals: Progressing toward goals    Frequency    Min 3X/week      PT Plan Current plan remains appropriate    Co-evaluation              AM-PAC PT "6 Clicks" Daily Activity  Outcome Measure  Difficulty turning over in bed (including adjusting bedclothes, sheets and blankets)?: Unable Difficulty moving from lying on back to sitting on the side of the bed? : Unable Difficulty sitting down on and standing up from a chair with arms (e.g., wheelchair, bedside commode, etc,.)?: Unable Help needed moving to and from a bed to chair (including a wheelchair)?: A Lot Help needed walking in hospital room?: A Lot Help needed climbing 3-5 steps with a railing? : Total 6 Click Score: 8    End of Session Equipment Utilized During Treatment: Gait belt Activity Tolerance: Patient limited by fatigue;Patient limited by pain (Pain in L knee) Patient left: in chair;with call bell/phone within reach;with chair alarm set;with family/visitor present Nurse Communication: Mobility status PT Visit Diagnosis: Unsteadiness on feet (R26.81);Other abnormalities of gait and mobility (R26.89);History of falling (Z91.81);Difficulty in walking, not elsewhere classified (R26.2)     Time: 3845-3646 PT Time Calculation (min) (ACUTE ONLY): 16 min  Charges:  $Gait Training: 8-22 mins                    G Codes:       Johnnie Goynes SPT   Jaylenn Baiza 12/04/2016, 3:38 PM

## 2016-12-05 ENCOUNTER — Encounter (HOSPITAL_COMMUNITY): Payer: Self-pay | Admitting: General Surgery

## 2016-12-05 LAB — BASIC METABOLIC PANEL
Anion gap: 7 (ref 5–15)
BUN: 14 mg/dL (ref 6–20)
CALCIUM: 8.2 mg/dL — AB (ref 8.9–10.3)
CO2: 27 mmol/L (ref 22–32)
Chloride: 100 mmol/L — ABNORMAL LOW (ref 101–111)
Creatinine, Ser: 0.83 mg/dL (ref 0.44–1.00)
GLUCOSE: 168 mg/dL — AB (ref 65–99)
Potassium: 4.2 mmol/L (ref 3.5–5.1)
Sodium: 134 mmol/L — ABNORMAL LOW (ref 135–145)

## 2016-12-05 LAB — PHOSPHORUS: PHOSPHORUS: 3.2 mg/dL (ref 2.5–4.6)

## 2016-12-05 LAB — GLUCOSE, CAPILLARY
GLUCOSE-CAPILLARY: 150 mg/dL — AB (ref 65–99)
GLUCOSE-CAPILLARY: 207 mg/dL — AB (ref 65–99)
Glucose-Capillary: 181 mg/dL — ABNORMAL HIGH (ref 65–99)
Glucose-Capillary: 184 mg/dL — ABNORMAL HIGH (ref 65–99)
Glucose-Capillary: 185 mg/dL — ABNORMAL HIGH (ref 65–99)
Glucose-Capillary: 198 mg/dL — ABNORMAL HIGH (ref 65–99)

## 2016-12-05 LAB — MAGNESIUM: Magnesium: 1.6 mg/dL — ABNORMAL LOW (ref 1.7–2.4)

## 2016-12-05 MED ORDER — FAT EMULSION 20 % IV EMUL
240.0000 mL | INTRAVENOUS | Status: AC
  Administered 2016-12-05: 240 mL via INTRAVENOUS
  Filled 2016-12-05: qty 250

## 2016-12-05 MED ORDER — POLYETHYLENE GLYCOL 3350 17 G PO PACK
17.0000 g | PACK | Freq: Two times a day (BID) | ORAL | Status: DC
  Administered 2016-12-05 – 2016-12-07 (×3): 17 g via ORAL
  Filled 2016-12-05 (×5): qty 1

## 2016-12-05 MED ORDER — GABAPENTIN 600 MG PO TABS
600.0000 mg | ORAL_TABLET | Freq: Three times a day (TID) | ORAL | Status: DC
  Administered 2016-12-05 – 2016-12-08 (×9): 600 mg via ORAL
  Filled 2016-12-05 (×9): qty 1

## 2016-12-05 MED ORDER — TRACE MINERALS CR-CU-MN-SE-ZN 10-1000-500-60 MCG/ML IV SOLN
INTRAVENOUS | Status: AC
  Administered 2016-12-05: 18:00:00 via INTRAVENOUS
  Filled 2016-12-05: qty 1992

## 2016-12-05 MED ORDER — MAGNESIUM SULFATE 4 GM/100ML IV SOLN
4.0000 g | Freq: Once | INTRAVENOUS | Status: AC
  Administered 2016-12-05: 4 g via INTRAVENOUS
  Filled 2016-12-05: qty 100

## 2016-12-05 NOTE — OR Nursing (Signed)
ADDED SUPPLY

## 2016-12-05 NOTE — Progress Notes (Addendum)
PHARMACY - ADULT TOTAL PARENTERAL NUTRITION CONSULT NOTE   Pharmacy Consult:  TPN Indication:  SBO  Patient Measurements: Height: 5\' 4"  (162.6 cm) Weight: 258 lb (117 kg) IBW/kg (Calculated) : 54.7 TPN AdjBW (KG): 70.3 Body mass index is 44.29 kg/m.  Assessment:  38 YOF with history of abdominal wall seroma presented on 11/23/16 for removal of mesh and hernia repair.  11/25/16 CT showed recurrent hernia with SBO and patient underwent emergent ex-lap with placement of wound vac.  On 11/26/16, she had closure of the lateral abdominal fascia and opening of the midline fascia.  On 11/29/16, patient underwent ex-lap with closure of abdomen and placement of biological mesh and wound VAC.  Pharmacy consulted to manage TPN until GI function returns.  Family and patient reported that PTA she was eating at 75% capacity compared to before.  GI: hx colon cancer/GERD - PPI IV.  Change wound VAC MWF.  Pre-albumin up at 10.8.  Minimal NG/drain O/P, emesis x1 on 9/8. PRN Zofran Endo: DM on metformin PTA - CBGs 165-206 since new TPN hung at 1800. Off steroids since 9/7.  Insulin requirements in the past 12 hours: 15 units SSI + 34 units in TPN Lytes: refeeding resolving -Mg still low 1.6 despite 4g on 9/8 and 9/9, Phos wnl. CoCa 9.92 Renal: SCr 0.8 stable, BUN WNL - UOP 0.6 ml/kg/hr, LR at 10 ml/hr Pulm: intubated post-op 9/1, extubated 9/6, now on RA Cards: HLD - BP high normal, HR controlled - PRN hydralazine/metoprolol, Lasix x1 on 9/7 AC/Heme: hx PE post IVC filter.  CBC stable. Hepatobil: LFTs / tbili WNL.  TG mildly elevated at 179. Neuro: anxious - PRN Fentanyl/Ativan/morphine, Celexa and gabapentin. ID: afebrile, WBC trending down at 11.1 - not on abx Best Practices: heparin SQ TPN Access: PICC placed 11/28/16 TPN start date: 11/30/16  Nutritional Goals (per RD rec 9/10):  2100-2400 kCal and 117-136 gm protein per day  Current Nutrition: TPN, CLD goal: Clinimix E 5/15 at 100 ml/hr + 20% ILE at 20  ml/hr x 12 hrs   Plan:  - Increase Clinimix E 5/15 to 83 ml/hr. - Begin IVFA 20% at 20 ml/hr over 12 hours - Together, this will provide 100 g protein and 1894 kcal per day, meeting 84% of protein needs and 90% of kcal needs - Daily multivitamin and trace elements in TPN - Continue moderate SSI Q4H + increase insulin in TPN to 40 units - Repeat Mag sulfate 4gm IV x 1  - F/U AM labs, CBGs - F/u diet advancement    Hughes Better, PharmD, BCPS Clinical Pharmacist 12/05/2016 9:22 AM

## 2016-12-05 NOTE — Progress Notes (Signed)
6 Days Post-Op   Subjective/Chief Complaint: abdominal pain Looks and feels better hoarse  Pain better controlled    Objective: Vital signs in last 24 hours: Temp:  [98.7 F (37.1 C)-100.4 F (38 C)] 98.7 F (37.1 C) (09/11 0523) Pulse Rate:  [91-102] 102 (09/11 0523) Resp:  [20-25] 20 (09/11 0523) BP: (111-158)/(56-85) 111/85 (09/11 0523) SpO2:  [94 %-96 %] 95 % (09/11 0523) Last BM Date: 11/22/16  Intake/Output from previous day: 09/10 0701 - 09/11 0700 In: 1353.3 [P.O.:300; I.V.:1053.3] Out: 8676 [Urine:4050; Drains:100] Intake/Output this shift: Total I/O In: -  Out: 900 [Urine:900]  Incision/Wound:vac in place with serosanguinous drainage  Lab Results:   Recent Labs  12/04/16 0510  WBC 11.1*  HGB 9.1*  HCT 29.7*  PLT 321   BMET  Recent Labs  12/04/16 0510 12/05/16 0616  NA 137 134*  K 4.1 4.2  CL 103 100*  CO2 28 27  GLUCOSE 206* 168*  BUN 12 14  CREATININE 0.75 0.83  CALCIUM 8.4* 8.2*   PT/INR No results for input(s): LABPROT, INR in the last 72 hours. ABG No results for input(s): PHART, HCO3 in the last 72 hours.  Invalid input(s): PCO2, PO2  Studies/Results: No results found.  Anti-infectives: Anti-infectives    Start     Dose/Rate Route Frequency Ordered Stop   11/29/16 1500  ceFAZolin (ANCEF) IVPB 2g/100 mL premix  Status:  Discontinued     2 g 200 mL/hr over 30 Minutes Intravenous On call to O.R. 11/29/16 1443 11/29/16 1622   11/26/16 0900  cefoTEtan (CEFOTAN) 2 g in dextrose 5 % 50 mL IVPB     2 g 100 mL/hr over 30 Minutes Intravenous On call to O.R. 11/26/16 0848 11/26/16 1020   11/25/16 1545  ceFAZolin (ANCEF) IVPB 2g/100 mL premix     2 g 200 mL/hr over 30 Minutes Intravenous  Once 11/25/16 1539 11/25/16 1708   11/23/16 1730  ceFAZolin (ANCEF) IVPB 2g/100 mL premix     2 g 200 mL/hr over 30 Minutes Intravenous Every 8 hours 11/23/16 1558 11/23/16 1722   11/23/16 0600  ceFAZolin (ANCEF) 3 g in dextrose 5 % 50 mL IVPB     3 g 130 mL/hr over 30 Minutes Intravenous On call to O.R. 11/22/16 1331 11/23/16 0945      Assessment/Plan: s/p Procedure(s): EXPLORATORY LAPAROTOMY (N/A) PLACEMENT OF STRATTICE BIOLOGIC MESH APPLICATION OF WOUND VAC (N/A) Advance diet  If she tolerates this D/C TNA in the next 2 days  Cont PT   LOS: 12 days    Eileen Kangas A. 12/05/2016

## 2016-12-05 NOTE — Evaluation (Signed)
Occupational Therapy Evaluation Patient Details Name: Krystal Campbell MRN: [id number] DOB: 08/11/1960 Today's Date: 12/05/2016    History of Present Illness Patient is a 56 y/o female admitted with Recurrent incisional hernia, S/p Removal of mesh and repair of incisional hernia primary with transversus abdominis release, 11/23/16, S/p EXPLORATORY LAPAROTOMY AND PLACEMENT OF OPEN ABDOMEN VAC, 11/25/16, S/p  closure of lateral abdominal fascial for 20cm in length, opening of midline fascia, 11/26/16, S/p Exploratory laparotomy with closure of abdomen using  Strattice biological mesh and placement of wound VAC, 11/29/16, and acute respiratory failure following procedure requiring mechanical ventilation, was extubated 11/30/15   Clinical Impression   PTA, pt lived with her husband and was independence with ADLs, IADLs, and 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 Pt currently requiring Min A for ADLs and functional mobility with RW. Pt demonstrating decreased activity tolerance, endurance, and functional performance. Pt fatigues very quickly during ADLs and requires rest breaks. Pt would benefit from acute OT to facilitate safe dc. Recommend dc to SNF for further OT to optimize safety, independence, and return to PLOF.     Follow Up Recommendations  SNF;Supervision/Assistance - 24 hour    Equipment Recommendations  Other (comment) (Defer to next venue)    Recommendations for Other Services       Precautions / Restrictions Precautions Precautions: Fall;Other (comment) (Abdominal wound with vac) Restrictions Weight Bearing Restrictions: No      Mobility Bed Mobility Overal bed mobility: Needs Assistance Bed Mobility: Rolling;Sidelying to Sit;Sit to Supine Rolling: Min assist Sidelying to sit: Min assist;HOB elevated   Sit to supine: Min guard   General bed mobility comments: Min A to power into sitting due to fatigue  Transfers Overall transfer level: Needs assistance Equipment used: Rolling  walker (2 wheeled) Transfers: Sit to/from Stand Sit to Stand: Min assist         General transfer comment: Min A for safety and steadying in standing    Balance Overall balance assessment: Needs assistance Sitting-balance support: Bilateral upper extremity supported Sitting balance-Leahy Scale: Fair     Standing balance support: Bilateral upper extremity supported Standing balance-Leahy Scale: Poor Standing balance comment: Pt reliant on UE support throughout ADLs                           ADL either performed or assessed with clinical judgement   ADL Overall ADL's : Needs assistance/impaired Eating/Feeding: Set up;Sitting   Grooming: Minimal assistance;Sitting;Standing Grooming Details (indicate cue type and reason): Pt performed oral care at sink while sitting due to fatigue and decreased activity tolerance. Pt washed her face while standing with Min A, but required rest break after.  Upper Body Bathing: Minimal assistance;Sitting   Lower Body Bathing: Minimal assistance;Sit to/from stand   Upper Body Dressing : Minimal assistance;Sitting   Lower Body Dressing: Minimal assistance;Sit to/from stand Lower Body Dressing Details (indicate cue type and reason): Pt able to bring ankle to knee, but has difficulty due to decreased activity tolerance and significant pain Toilet Transfer: Minimal assistance;Ambulation;RW;BSC           Functional mobility during ADLs: Minimal assistance;Rolling walker General ADL Comments: Pt presenting with decreased endurnace, activity tolerance, and fucntional performance.      Vision Baseline Vision/History: Wears glasses Wears Glasses: At all times Patient Visual Report: No change from baseline       Perception     Praxis      Pertinent Vitals/Pain Pain Assessment: 0-10 Pain  Score: 9  Faces Pain Scale: Hurts even more Pain Location: abdomen with movement Pain Descriptors / Indicators: Grimacing;Operative site  guarding Pain Intervention(s): Monitored during session;Repositioned     Hand Dominance     Extremity/Trunk Assessment Upper Extremity Assessment Upper Extremity Assessment: Generalized weakness   Lower Extremity Assessment Lower Extremity Assessment: Defer to PT evaluation   Cervical / Trunk Assessment Cervical / Trunk Assessment: Other exceptions Cervical / Trunk Exceptions: s/p EXPLORATORY LAPAROTOMY AND PLACEMENT OF OPEN ABDOMEN VAC   Communication Communication Communication: No difficulties   Cognition Arousal/Alertness: Awake/alert Behavior During Therapy: WFL for tasks assessed/performed Overall Cognitive Status: Within Functional Limits for tasks assessed                                    General Comments       Exercises     Shoulder Instructions      Home Living Family/patient expects to be discharged to:: Skilled nursing facility Living Arrangements: Spouse/significant other                               Additional Comments: spouse disabled, unable to assist pt      Prior Functioning/Environment Level of Independence: Independent        Comments: Pt performing her ADLs and IADLs as well as acting for caregiver for her husband.        OT Problem List: Decreased strength;Decreased range of motion;Decreased activity tolerance;Impaired balance (sitting and/or standing);Decreased safety awareness;Decreased knowledge of use of DME or AE;Decreased knowledge of precautions;Pain;Obesity      OT Treatment/Interventions: Self-care/ADL training;Therapeutic exercise;Energy conservation;DME and/or AE instruction;Therapeutic activities;Patient/family education    OT Goals(Current goals can be found in the care plan section) Acute Rehab OT Goals Patient Stated Goal: To return to independent OT Goal Formulation: With patient Time For Goal Achievement: 12/19/16 Potential to Achieve Goals: Good ADL Goals Pt Will Perform Grooming: with  modified independence;standing Pt Will Perform Upper Body Dressing: with modified independence;sitting Pt Will Perform Lower Body Dressing: with modified independence;sit to/from stand Pt Will Transfer to Toilet: with modified independence;bedside commode;ambulating  OT Frequency: Min 2X/week   Barriers to D/C: Decreased caregiver support  Pt is caregiver for her husband       Co-evaluation              AM-PAC PT "6 Clicks" Daily Activity     Outcome Measure Help from another person eating meals?: None Help from another person taking care of personal grooming?: A Little Help from another person toileting, which includes using toliet, bedpan, or urinal?: A Little Help from another person bathing (including washing, rinsing, drying)?: A Little Help from another person to put on and taking off regular upper body clothing?: A Little Help from another person to put on and taking off regular lower body clothing?: A Little 6 Click Score: 19   End of Session Equipment Utilized During Treatment: Gait belt;Rolling walker Nurse Communication: Mobility status;Other (comment) (output at [facility])  Activity Tolerance: Patient tolerated treatment well;Patient limited by fatigue;Patient limited by pain Patient left: in bed;with call bell/phone within reach  OT Visit Diagnosis: Unsteadiness on feet (R26.81);Other abnormalities of gait and mobility (R26.89);Muscle weakness (generalized) (M62.81);Pain Pain - Right/Left:  (Abdomen) Pain - part of body:  (Abdomen)                Time: 5643-3295  OT Time Calculation (min): 30 min Charges:  OT General Charges $OT Visit: 1 Visit OT Evaluation $OT Eval Moderate Complexity: 1 Mod OT Treatments $Self Care/Home Management : 8-22 mins G-Codes:     Sarie Stall MSOT, OTR/L Acute Rehab Pager: [phone] Office: [place] 12/05/2016, 6:08 PM

## 2016-12-05 NOTE — Care Management Important Message (Signed)
Important Message  Patient Details  Name: Krystal Campbell MRN: [id number] Date of Birth: December 28, 1960   Medicare Important Message Given:  Yes    Tariya Morrissette 12/05/2016, 11:17 AM

## 2016-12-06 LAB — GLUCOSE, CAPILLARY
GLUCOSE-CAPILLARY: 170 mg/dL — AB (ref 65–99)
GLUCOSE-CAPILLARY: 173 mg/dL — AB (ref 65–99)
GLUCOSE-CAPILLARY: 196 mg/dL — AB (ref 65–99)
Glucose-Capillary: 171 mg/dL — ABNORMAL HIGH (ref 65–99)
Glucose-Capillary: 185 mg/dL — ABNORMAL HIGH (ref 65–99)
Glucose-Capillary: 205 mg/dL — ABNORMAL HIGH (ref 65–99)
Glucose-Capillary: 219 mg/dL — ABNORMAL HIGH (ref 65–99)

## 2016-12-06 MED ORDER — TRACE MINERALS CR-CU-MN-SE-ZN 10-1000-500-60 MCG/ML IV SOLN
INTRAVENOUS | Status: DC
  Administered 2016-12-06: 18:00:00 via INTRAVENOUS
  Filled 2016-12-06: qty 2640

## 2016-12-06 MED ORDER — PNEUMOCOCCAL VAC POLYVALENT 25 MCG/0.5ML IJ INJ: 0.5000 mL | INJECTION | INTRAMUSCULAR | Status: DC

## 2016-12-06 MED ORDER — ENSURE ENLIVE PO LIQD
237.0000 mL | Freq: Two times a day (BID) | ORAL | Status: DC
  Administered 2016-12-06 – 2016-12-07 (×2): 237 mL via ORAL

## 2016-12-06 MED ORDER — LORAZEPAM 0.5 MG PO TABS
0.5000 mg | ORAL_TABLET | Freq: Once | ORAL | Status: AC
  Administered 2016-12-07: 0.5 mg via ORAL
  Filled 2016-12-06: qty 1

## 2016-12-06 MED ORDER — FAT EMULSION 20 % IV EMUL
240.0000 mL | INTRAVENOUS | Status: DC
  Administered 2016-12-06: 240 mL via INTRAVENOUS
  Filled 2016-12-06: qty 250

## 2016-12-06 MED ORDER — INFLUENZA VAC SPLIT QUAD 0.5 ML IM SUSY
0.5000 mL | PREFILLED_SYRINGE | INTRAMUSCULAR | Status: AC
  Administered 2016-12-08: 0.5 mL via INTRAMUSCULAR
  Filled 2016-12-06: qty 0.5

## 2016-12-06 NOTE — Progress Notes (Signed)
Nutrition Follow-up  DOCUMENTATION CODES:   Morbid obesity  INTERVENTION:   -TPN management per pharmacy -Ensure Enlive po BID, each supplement provides 350 kcal and 20 grams of protein  NUTRITION DIAGNOSIS:   Increased nutrient needs related to wound healing as evidenced by estimated needs.  Ongoing  GOAL:   Patient will meet greater than or equal to 90% of their needs  Progressing  MONITOR:   PO intake, Supplement acceptance, Labs, Weight trends, Skin, I & O's  REASON FOR ASSESSMENT:   Ventilator    ASSESSMENT:   Pt with hx of DM, CKD, colon cancer, and recurring abd wall seromas, admitted for surgical intervention now s/p removal of mesh from abd wall, incisional hernia repair 8/30, s/p exp lap and placement of open abd VAC for recurrent incisional hernia with SBO 9/1, s/p exp lap with closure of lateral abd fascia and reopening of midline fascia 9/2.   Pt advanced to a soft diet on 12/05/16. Spoke with pt, who reports that intake is improving each day. She reports her appetite is still poor and complains about soreness in the back of her throat. She is keeping foods and liquids down without difficulty, however, is unable to eat much- noted 5-15% meal completion. Pt shared she consumed chocolate pudding and a banana that her son brought in for her last night.   Pt reports she does not like the Boost Breeze supplements, but is amenable to Ensure supplements. RD to modify order.   Pt remains in TPN; pt receiving Clinimix 5/15 @ 83 ml/hr and IVFA 20% @ 20 ml/hr over 12 hours. Regimen providing 1894 kcals daily, and 100 grams of protein, meeting 90% of estimated kcal needs and 85% of protein needs. Per surgery notes, plan to wean TPN in the next 1-2 days if pt continues to tolerate PO diet.   Labs reviewed: CBGS: 173-205.   Diet Order:  DIET SOFT Room service appropriate? Yes; Fluid consistency: Thin TPN (CLINIMIX-E) Adult .TPN (CLINIMIX-E) Adult  Skin:  Wound (see  comment) (abdominal incision with wound vac)  Last BM:  12/05/16  Height:   Ht Readings from Last 1 Encounters:  11/23/16 5\' 4"  (1.626 m)    Weight:   Wt Readings from Last 1 Encounters:  12/06/16 268 lb 15.4 oz (122 kg)    Ideal Body Weight:  54.5 kg  BMI:  Body mass index is 46.17 kg/m.  Estimated Nutritional Needs:   Kcal:  2100-2400 kcals  Protein:  117-136 g  Fluid:  >/= 2 L  EDUCATION NEEDS:   No education needs identified at this time  Arietta Eisenstein A. Jimmye Norman, RD, LDN, CDE Pager: [phone] After hours Pager: [phone]

## 2016-12-06 NOTE — Progress Notes (Signed)
Physical Therapy Treatment Patient Details Name: Krystal Campbell MRN: [id number] DOB: [birth year] Today's Date: 12/06/2016    History of Present Illness Patient is a 56 y/o female admitted with Recurrent incisional hernia, S/p Removal of mesh and repair of incisional hernia primary with transversus abdominis release, 11/23/16, S/p EXPLORATORY LAPAROTOMY AND PLACEMENT OF OPEN ABDOMEN VAC, 11/25/16, S/p  closure of lateral abdominal fascial for 20cm in length, opening of midline fascia, 11/26/16, S/p Exploratory laparotomy with closure of abdomen using  Strattice biological mesh and placement of wound VAC, 11/29/16, and acute respiratory failure following procedure requiring mechanical ventilation, was extubated 11/30/15    PT Comments    Focus of treatment on pt comfort and function due to felt needed to bathe and was tired from not sleeping well.  Patient improved and was able to walk in hallway this am with mobility tech.  Feel continued skilled PT in the acute setting indicated prior to d/c to STSNF level rehab.   Follow Up Recommendations  SNF     Equipment Recommendations  None recommended by PT    Recommendations for Other Services       Precautions / Restrictions Precautions Precautions: Fall;Other (comment) (wound vac on abdominal wound)    Mobility  Bed Mobility Overal bed mobility: Needs Assistance         Sit to supine: Min guard   General bed mobility comments: assist to scoot up in bed mod A +2  Transfers Overall transfer level: Needs assistance   Transfers: Sit to/from Stand Sit to Stand: Min guard         General transfer comment: assist for balance, standing without device with assist for hygiene after toileting  Ambulation/Gait Ambulation/Gait assistance: Min assist Ambulation Distance (Feet): 20 Feet Assistive device: Rolling walker (2 wheeled) Gait Pattern/deviations: Step-through pattern;Trunk flexed;Decreased stride length     General Gait Details: assist  into bathroom to stand at sink for sponge bath then back to bed   Stairs            Wheelchair Mobility    Modified Rankin (Stroke Patients Only)       Balance Overall balance assessment: Needs assistance   Sitting balance-Leahy Scale: Good     Standing balance support: Single extremity supported;Bilateral upper extremity supported;During functional activity Standing balance-Leahy Scale: Fair Standing balance comment: intermittent UE support on sink while standing to sponge bathe with assist.                             Cognition Arousal/Alertness: Awake/alert Behavior During Therapy: WFL for tasks assessed/performed (little tearful at times) Overall Cognitive Status: Within Functional Limits for tasks assessed                                        Exercises      General Comments        Pertinent Vitals/Pain Faces Pain Scale: Hurts even more Pain Location: abdomen with movement Pain Descriptors / Indicators: Grimacing;Operative site guarding Pain Intervention(s): Monitored during session    Home Living                      Prior Function            PT Goals (current goals can now be found in the care plan section) Progress towards PT goals: Progressing toward  goals    Frequency    Min 3X/week      PT Plan Current plan remains appropriate    Co-evaluation              AM-PAC PT "6 Clicks" Daily Activity  Outcome Measure  Difficulty turning over in bed (including adjusting bedclothes, sheets and blankets)?: A Little Difficulty moving from lying on back to sitting on the side of the bed? : Unable Difficulty sitting down on and standing up from a chair with arms (e.g., wheelchair, bedside commode, etc,.)?: Unable Help needed moving to and from a bed to chair (including a wheelchair)?: A Little Help needed walking in hospital room?: A Little Help needed climbing 3-5 steps with a railing? : A Lot 6  Click Score: 13    End of Session   Activity Tolerance: Patient limited by fatigue;Patient limited by pain Patient left: in bed;with call bell/phone within reach   PT Visit Diagnosis: Unsteadiness on feet (R26.81);Other abnormalities of gait and mobility (R26.89);History of falling (Z91.81);Difficulty in walking, not elsewhere classified (R26.2)     Time: 9450-3888 PT Time Calculation (min) (ACUTE ONLY): 20 min  Charges:  $Therapeutic Activity: 8-22 mins                    G CodesMagda Kiel, [place] [phone] 12/06/2016    Reginia Naas 12/06/2016, 5:23 PM

## 2016-12-06 NOTE — Progress Notes (Signed)
Patient requested to speak with Chaplain because of her anxiety.  When I walked into the room the PT broke down into tears.  She was struggling with being in the hospital and not being able to get back to her regular life.  PT also mentioned having gotten little sleep last evening because of the activity on the floor.  Chaplain sat with PT to talk her though her anxieties.  Patient revealed stress from home and making sure bills were paid also added to her anxiety.  Chaplain encouraged PT that she was doing a great job in recovery compared to how she looked previous days.    Chaplain said he would follow up on Friday if she was still in the hospital.

## 2016-12-06 NOTE — Consult Note (Addendum)
[place] Nurse wound consult note Pt is followed by the surgical team for plan of care for abd wound. Reason for Consult: Vac dressing change Wound type: Full thickness post-op wound to midline abd Wound bed: White allograft material visible to inner wound bed with sutures, surrounding wound edges beefy red.  Wound has decreased slightly in size; 14X8X4cm with undermining to wound edges, greatest location is to 6 cm. Drainage (amount, consistency, odor) Mod amt yellow drainage in the lower wound and the Vac cannister, some strong odor when the dressing was changed Periwound: Intact skin surrounding Dressing procedure/placement/frequency: Removed previous 2 pieces of Vac sponge and nonadherent layer, then applied new nonadherent contact layer to inner wound, then 1 piece of white foam and 2 pieces black foam to 136mm cont suction. Pt tolerated with mod amt discomfort after pain meds were given. WOC will plan to change dressings on M/W/F Julien Girt MSN, [place], [place], [place], [place]

## 2016-12-06 NOTE — Progress Notes (Addendum)
Subjective No acute events. Met with her son last night; worried about going home to take care of husband; asking about in-pt rehab options. Denies complaints today; tolerated some food yesterday ~50% of her trays. No n/v. Pain controlled  Objective: Vital signs in last 24 hours: Temp:  [98.7 F (37.1 C)-99.5 F (37.5 C)] 99.5 F (37.5 C) (09/12 0507) Pulse Rate:  [87-93] 87 (09/12 0507) Resp:  [18-19] 18 (09/12 0507) BP: (128-136)/(63-73) 128/63 (09/12 0507) SpO2:  [95 %-97 %] 97 % (09/12 0507) Weight:  [122 kg (268 lb 15.4 oz)] 122 kg (268 lb 15.4 oz) (09/12 0500) Last BM Date: 12/05/16  Intake/Output from previous day: 09/11 0701 - 09/12 0700 In: 1385.6 [P.O.:440; I.V.:945.6] Out: 900 [Urine:900] Intake/Output this shift: No intake/output data recorded.  Gen: NAD, comfortable CV: RRR Pulm: Normal work of breathing Abd: Obese, soft, NT; WVac to midline in place, changed this morning Ext: SCDs in place  Lab Results: CBC   Recent Labs  12/04/16 0510  WBC 11.1*  HGB 9.1*  HCT 29.7*  PLT 321   BMET  Recent Labs  12/04/16 0510 12/05/16 0616  NA 137 134*  K 4.1 4.2  CL 103 100*  CO2 28 27  GLUCOSE 206* 168*  BUN 12 14  CREATININE 0.75 0.83  CALCIUM 8.4* 8.2*   Assessment/Plan: Patient Active Problem List   Diagnosis Date Noted  . Diabetes mellitus type 2 in obese ([place])   . Stage 3 chronic kidney disease   . Malignant neoplasm of colon ([place])   . History of pulmonary embolism   . On total parenteral nutrition (TPN)   . Leukocytosis   . Acute blood loss anemia   . Tachypnea   . Reactive hypertension   . Post-operative pain   . Encounter for central line placement   . Encounter for orogastric (OG) tube placement   . Acute respiratory failure ([place])   . Abdominal wall seroma ([place]) 11/23/2016  . S/P hernia surgery 01/17/2016  . Adenocarcinoma of colon ([place]) 11/10/2014  . Obesity 11/10/2014  . Acute pulmonary embolism ([place]) 10/26/2014  . Iron deficiency  anemia 10/26/2014  . Cecal lesion 10/25/2014  . Anxiety state 10/25/2014  . Absolute anemia   . GERD (gastroesophageal reflux disease) 10/10/2014  . Microcytic anemia 10/09/2014  . Diabetes ([place]) 10/09/2014  . Hypertension 10/09/2014   s/p Procedure(s): EXPLORATORY LAPAROTOMY PLACEMENT OF STRATTICE BIOLOGIC MESH APPLICATION OF WOUND VAC 11/29/2016  -Diet as tolerated -Continue TPN today; anticipate weaning off tomorrow if continues to improve PO -WVac to midline -SW working on placement  LOS: 13 days   Ileana Roup, MD [facility]

## 2016-12-06 NOTE — Progress Notes (Signed)
PHARMACY - ADULT TOTAL PARENTERAL NUTRITION CONSULT NOTE   Pharmacy Consult:  TPN Indication:  SBO  Patient Measurements: Height: 5\' 4"  (162.6 cm) Weight: 268 lb 15.4 oz (122 kg) IBW/kg (Calculated) : 54.7 TPN AdjBW (KG): 70.3 Body mass index is 46.17 kg/m.  Assessment:  52 YOF with history of abdominal wall seroma presented on 11/23/16 for removal of mesh and hernia repair.  11/25/16 CT showed recurrent hernia with SBO and patient underwent emergent ex-lap with placement of wound vac.  On 11/26/16, she had closure of the lateral abdominal fascia and opening of the midline fascia.  On 11/29/16, patient underwent ex-lap with closure of abdomen and placement of biological mesh and wound VAC.  Pharmacy consulted to manage TPN until GI function returns.  Family and patient reported that PTA she was eating at 75% capacity compared to before.  GI: hx colon cancer/GERD - PPI IV.  Change wound VAC MWF.  Pre-albumin up at 10.8.  Minimal NG/drain O/P, emesis x1 on 9/8. PRN Zofran Endo: DM on metformin PTA - CBGs 150-200s. Off steroids since 9/7.  Insulin requirements in the past 12 hours: 21 units SSI + 40 units in TPN Lytes: ?refeeding resolved, Mg 1.6 - rec'd 4 g x1 yesterday, recheck tomorrow, Phos wnl. CoCa 9.7 Renal: SCr 0.8 stable, BUN WNL - UOP 0.6 ml/kg/hr, LR at 10 ml/hr Pulm: intubated post-op 9/1, extubated 9/6, now on RA Cards: HLD - BP high normal, HR controlled - PRN hydralazine/metoprolol, Lasix x1 on 9/7 AC/Heme: hx PE post IVC filter.  CBC stable. Hepatobil: LFTs / tbili WNL.  TG mildly elevated at 179. Neuro: anxious - PRN Fentanyl/Ativan/morphine, Celexa and gabapentin. ID: afebrile, WBC trending down at 11.1 Best Practices: heparin SQ TPN Access: PICC placed 11/28/16 TPN start date: 11/30/16  Nutritional Goals (per RD rec 9/10):  2100-2400 kCal and 117-136 gm protein per day  Current Nutrition: TPN, CLD   Plan:  - Increase Clinimix E 5/15 to 100 ml/hr - Continue IVFA 20% at 20  ml/hr over 12 hours - Together, this will provide 132 g protein and 2354 kcal per day, meeting 100% of protein and kcal needs - Daily multivitamin and trace elements in TPN - Continue moderate SSI q4h + increase insulin in TPN to 45 units - F/u AM labs, CBGs - F/u diet advancement, MD notes possibility of tapering TPN tomorrow    Hughes Better, PharmD, BCPS Clinical Pharmacist 12/06/2016 10:02 AM

## 2016-12-07 LAB — GLUCOSE, CAPILLARY
GLUCOSE-CAPILLARY: 207 mg/dL — AB (ref 65–99)
GLUCOSE-CAPILLARY: 146 mg/dL — AB (ref 65–99)
Glucose-Capillary: 316 mg/dL — ABNORMAL HIGH (ref 65–99)
Glucose-Capillary: 142 mg/dL — ABNORMAL HIGH (ref 65–99)
Glucose-Capillary: 176 mg/dL — ABNORMAL HIGH (ref 65–99)

## 2016-12-07 LAB — COMPREHENSIVE METABOLIC PANEL
ALT: 24 U/L (ref 14–54)
ANION GAP: 7 (ref 5–15)
AST: 22 U/L (ref 15–41)
Albumin: 2.3 g/dL — ABNORMAL LOW (ref 3.5–5.0)
Alkaline Phosphatase: 85 U/L (ref 38–126)
BUN: 17 mg/dL (ref 6–20)
CHLORIDE: 101 mmol/L (ref 101–111)
CO2: 25 mmol/L (ref 22–32)
Calcium: 8.3 mg/dL — ABNORMAL LOW (ref 8.9–10.3)
Creatinine, Ser: 0.87 mg/dL (ref 0.44–1.00)
Glucose, Bld: 203 mg/dL — ABNORMAL HIGH (ref 65–99)
Potassium: 4.2 mmol/L (ref 3.5–5.1)
SODIUM: 133 mmol/L — AB (ref 135–145)
Total Bilirubin: 0.4 mg/dL (ref 0.3–1.2)
Total Protein: 6 g/dL — ABNORMAL LOW (ref 6.5–8.1)

## 2016-12-07 LAB — PHOSPHORUS: PHOSPHORUS: 3.9 mg/dL (ref 2.5–4.6)

## 2016-12-07 LAB — MAGNESIUM: MAGNESIUM: 1.7 mg/dL (ref 1.7–2.4)

## 2016-12-07 MED ORDER — WHITE PETROLATUM GEL
Status: AC
  Administered 2016-12-07: 17:00:00
  Filled 2016-12-07: qty 1

## 2016-12-07 MED ORDER — MORPHINE SULFATE (PF) 4 MG/ML IV SOLN: 1.0000 mg | INTRAVENOUS | Status: DC | PRN

## 2016-12-07 MED ORDER — PANTOPRAZOLE SODIUM 40 MG PO TBEC
40.0000 mg | DELAYED_RELEASE_TABLET | Freq: Two times a day (BID) | ORAL | Status: DC
  Administered 2016-12-07 – 2016-12-08 (×2): 40 mg via ORAL
  Filled 2016-12-07 (×2): qty 1

## 2016-12-07 MED ORDER — LORATADINE 10 MG PO TABS
10.0000 mg | ORAL_TABLET | Freq: Every day | ORAL | Status: DC
  Administered 2016-12-07 – 2016-12-08 (×2): 10 mg via ORAL
  Filled 2016-12-07 (×2): qty 1

## 2016-12-07 MED ORDER — LORAZEPAM 1 MG PO TABS
1.0000 mg | ORAL_TABLET | Freq: Four times a day (QID) | ORAL | Status: DC | PRN
  Administered 2016-12-07 (×2): 1 mg via ORAL
  Filled 2016-12-07 (×2): qty 1

## 2016-12-07 MED ORDER — OXYCODONE HCL 5 MG PO TABS
5.0000 mg | ORAL_TABLET | ORAL | Status: DC | PRN
  Administered 2016-12-07 – 2016-12-08 (×6): 5 mg via ORAL
  Filled 2016-12-07 (×6): qty 1

## 2016-12-07 MED ORDER — ACETAMINOPHEN 325 MG PO TABS: 650.0000 mg | ORAL_TABLET | Freq: Four times a day (QID) | ORAL | Status: DC | PRN

## 2016-12-07 NOTE — Progress Notes (Signed)
Physical Therapy Treatment Patient Details Name: Krystal Campbell MRN: [id number] DOB: 01/21/1961 Today's Date: 12/07/2016    History of Present Illness Patient is a 55 y/o female admitted with Recurrent incisional hernia, S/p Removal of mesh and repair of incisional hernia primary with transversus abdominis release, 11/23/16, S/p EXPLORATORY LAPAROTOMY AND PLACEMENT OF OPEN ABDOMEN VAC, 11/25/16, S/p  closure of lateral abdominal fascial for 20cm in length, opening of midline fascia, 11/26/16, S/p Exploratory laparotomy with closure of abdomen using  Strattice biological mesh and placement of wound VAC, 11/29/16, and acute respiratory failure following procedure requiring mechanical ventilation, was extubated 11/30/15    PT Comments    Pt is generally weak and deconditioned with limited gait endurance.  She is slow and flexed over shaky knees with RW.  She would benefit from SNF level rehab at discharge and wants to go somewhere in [place] where her daughter works.  PT will continue to follow acutely.    Follow Up Recommendations  SNF     Equipment Recommendations  None recommended by PT    Recommendations for Other Services   NA     Precautions / Restrictions Precautions Precautions: Fall;Other (comment) Precaution Comments: wound vac abdominal wound    Mobility  Bed Mobility Overal bed mobility: Needs Assistance Bed Mobility: Rolling;Sidelying to Sit Rolling: Supervision Sidelying to sit: Supervision;HOB elevated       General bed mobility comments: Supervision for safety HOB minimally elevated and pt relying on UE support for transitions on bed rail.   Transfers Overall transfer level: Needs assistance Equipment used: Rolling walker (2 wheeled)   Sit to Stand: Min guard         General transfer comment: min guard assist for safety during transitions, supervision from higher toilet with grab bar.   Ambulation/Gait Ambulation/Gait assistance: Min guard Ambulation Distance (Feet):  35 Feet Assistive device: Rolling walker (2 wheeled) Gait Pattern/deviations: Step-through pattern;Shuffle;Trunk flexed Gait velocity: decreased Gait velocity interpretation: Below normal speed for age/gender General Gait Details: pt with slow, shuffling gait, flexed trunk, weak and trembling legs          Balance Overall balance assessment: Needs assistance Sitting-balance support: Feet supported;Bilateral upper extremity supported Sitting balance-Leahy Scale: Fair     Standing balance support: Bilateral upper extremity supported Standing balance-Leahy Scale: Fair                              Cognition Arousal/Alertness: Awake/alert Behavior During Therapy: WFL for tasks assessed/performed Overall Cognitive Status: Within Functional Limits for tasks assessed (not specifically tested)                                               Pertinent Vitals/Pain Pain Assessment: Faces Faces Pain Scale: Hurts little more Pain Location: abdomen Pain Descriptors / Indicators: Grimacing;Operative site guarding Pain Intervention(s): Limited activity within patient's tolerance;Monitored during session;Repositioned           PT Goals (current goals can now be found in the care plan section) Acute Rehab PT Goals Patient Stated Goal: To be strong enough to take care of her husband again Progress towards PT goals: Progressing toward goals    Frequency    Min 3X/week      PT Plan Current plan remains appropriate       AM-PAC PT "6 Clicks"  Daily Activity  Outcome Measure  Difficulty turning over in bed (including adjusting bedclothes, sheets and blankets)?: A Little Difficulty moving from lying on back to sitting on the side of the bed? : A Little Difficulty sitting down on and standing up from a chair with arms (e.g., wheelchair, bedside commode, etc,.)?: Unable Help needed moving to and from a bed to chair (including a wheelchair)?: A  Little Help needed walking in hospital room?: A Little Help needed climbing 3-5 steps with a railing? : A Lot 6 Click Score: 15    End of Session   Activity Tolerance: Patient limited by pain;Patient limited by fatigue Patient left: in chair;with call bell/phone within reach   PT Visit Diagnosis: Unsteadiness on feet (R26.81);Other abnormalities of gait and mobility (R26.89);History of falling (Z91.81);Difficulty in walking, not elsewhere classified (R26.2)     Time: 7711-6579 PT Time Calculation (min) (ACUTE ONLY): 20 min  Charges:  $Gait Training: 8-22 mins          Atleigh Gruen B. [place], [place], DPT [phone]            12/07/2016, 4:52 PM

## 2016-12-07 NOTE — Progress Notes (Signed)
Inpatient Diabetes Program Recommendations  AACE/ADA: New Consensus Statement on Inpatient Glycemic Control (2015)  Target Ranges:  Prepandial:   less than 140 mg/dL      Peak postprandial:   less than 180 mg/dL (1-2 hours)      Critically ill patients:  140 - 180 mg/dL   Lab Results  Component Value Date   GLUCAP 316 (H) 12/07/2016   HGBA1C 6.9 (H) 11/20/2016   Review of Glycemic Control  Inpatient Diabetes Program Recommendations:    Glucose spike this am 316 mg/dl. Patient with multiple issues with wound vac, d/cing of TPN and poor po intake while on supplements. A1c 6.9% on 11/20/16. Glucose trend determined by clinical improvement over time and increased PO intake. No recommendations at this time to Outpatient medication regimen. Patient will need to eat with Metformin dose. Patient will need close follow up with glucose checks at time of d/c.  Thanks,  Tama Headings RN, MSN, [facility] Inpatient Diabetes Coordinator Team Pager [phone] (8a-5p)

## 2016-12-07 NOTE — Progress Notes (Signed)
Patient ID: Krystal Campbell, female   DOB: October 04, 1960, 56 y.o.   MRN: [id number]  [facility] Surgery Progress Note  8 Days Post-Op  Subjective: CC- congestion  Tolerating soft diet. Denies n/v. Abdominal pain well controlled. BM yesterday.  Objective: Vital signs in last 24 hours: Temp:  [98.8 F (37.1 C)-99 F (37.2 C)] 99 F (37.2 C) (09/13 0507) Pulse Rate:  [84-93] 91 (09/13 0507) Resp:  [16-18] 16 (09/13 0507) BP: (115-125)/(51-72) 125/55 (09/13 0507) SpO2:  [97 %-98 %] 98 % (09/13 0507) Last BM Date: 12/06/16  Intake/Output from previous day: 09/12 0701 - 09/13 0700 In: 4348.7 [P.O.:1900; I.V.:2448.7] Out: 100 [Drains:100] Intake/Output this shift: Total I/O In: -  Out: 300 [Urine:300]  PE: Gen:  Alert, NAD, pleasant HEENT: EOM's intact, pupils equal and round Card:  RRR, no M/G/R heard Pulm:  CTAB, no W/R/R, effort normal Abd: obese, soft, NT/ND, +BS, midline vac in place Psych: A&Ox3  Skin: no rashes noted, warm and dry  Lab Results:  No results for input(s): WBC, HGB, HCT, PLT in the last 72 hours. BMET  Recent Labs  12/05/16 0616 12/07/16 0359  NA 134* 133*  K 4.2 4.2  CL 100* 101  CO2 27 25  GLUCOSE 168* 203*  BUN 14 17  CREATININE 0.83 0.87  CALCIUM 8.2* 8.3*   PT/INR No results for input(s): LABPROT, INR in the last 72 hours. CMP     Component Value Date/Time   NA 133 (L) 12/07/2016 0359   K 4.2 12/07/2016 0359   CL 101 12/07/2016 0359   CO2 25 12/07/2016 0359   GLUCOSE 203 (H) 12/07/2016 0359   BUN 17 12/07/2016 0359   CREATININE 0.87 12/07/2016 0359   CALCIUM 8.3 (L) 12/07/2016 0359   PROT 6.0 (L) 12/07/2016 0359   ALBUMIN 2.3 (L) 12/07/2016 0359   AST 22 12/07/2016 0359   ALT 24 12/07/2016 0359   ALKPHOS 85 12/07/2016 0359   BILITOT 0.4 12/07/2016 0359   GFRNONAA >60 12/07/2016 0359   GFRAA >60 12/07/2016 0359   Lipase     Component Value Date/Time   LIPASE 36 10/05/2009 1910       Studies/Results: No results  found.  Anti-infectives: Anti-infectives    Start     Dose/Rate Route Frequency Ordered Stop   11/29/16 1500  ceFAZolin (ANCEF) IVPB 2g/100 mL premix  Status:  Discontinued     2 g 200 mL/hr over 30 Minutes Intravenous On call to O.R. 11/29/16 1443 11/29/16 1622   11/26/16 0900  cefoTEtan (CEFOTAN) 2 g in dextrose 5 % 50 mL IVPB     2 g 100 mL/hr over 30 Minutes Intravenous On call to O.R. 11/26/16 0848 11/26/16 1020   11/25/16 1545  ceFAZolin (ANCEF) IVPB 2g/100 mL premix     2 g 200 mL/hr over 30 Minutes Intravenous  Once 11/25/16 1539 11/25/16 1708   11/23/16 1730  ceFAZolin (ANCEF) IVPB 2g/100 mL premix     2 g 200 mL/hr over 30 Minutes Intravenous Every 8 hours 11/23/16 1558 11/23/16 1722   11/23/16 0600  ceFAZolin (ANCEF) 3 g in dextrose 5 % 50 mL IVPB     3 g 130 mL/hr over 30 Minutes Intravenous On call to O.R. 11/22/16 1331 11/23/16 0945       Assessment/Plan Malnutrition - on TNA and now soft diet; prealbumin 10.8 (9/10) Hx of colon cancer DM HLD GERD/Diverticulosis/hiatal hernia Hx of PE/IVC filter  Recurrent incisional hernia S/p Removal of mesh and repair  of incisional hernia primary with transversus abdominis release, 11/23/16, Dr. Erroll Luna S/p Stella, 11/25/16, Dr. Georganna Skeans S/p closure of lateral abdominal fascial for 20cm in length, opening of midline fascia, 11/26/16, Dr. Gurney Maxin S/p Exploratory laparotomy with closure of abdomen using Strattice biological mesh and placement of wound VAC, 11/29/16, Thomas Cornett - tolerating soft diet and having bowelfunction - vac changes MWF  ID - no current antibiotics FEN - 1/2 TPN, soft diet, Ensure VTE - SCDs, heparin Foley - none Follow up - Dr. Brantley Stage 2-3 weeks  Plan - wean TPN and plan to stop tomorrow. Continue soft diet. Encourage more OOB/ambulation today. Add claritin. Plan for d/c to SNF tomorrow.   LOS: 14 days    Wellington Hampshire ,  [facility] Surgery 12/07/2016, 9:24 AM Pager: [phone] Consults: [phone] Mon-Fri 7:00 am-4:30 pm Sat-Sun 7:00 am-11:30 am

## 2016-12-07 NOTE — Discharge Summary (Signed)
[facility] Discharge Summary   Patient ID: Krystal Campbell MRN: [id number] DOB/AGE: [birth year] 56 y.o.  Admit date: 11/23/2016 Discharge date: 12/08/2016  Admitting Diagnosis: Chronic abdominal wall seroma  Discharge Diagnosis Patient Active Problem List   Diagnosis Date Noted  . Diabetes mellitus type 2 in obese ([place])   . Stage 3 chronic kidney disease   . Malignant neoplasm of colon ([place])   . History of pulmonary embolism   . On total parenteral nutrition (TPN)   . Leukocytosis   . Acute blood loss anemia   . Tachypnea   . Reactive hypertension   . Post-operative pain   . Encounter for central line placement   . Encounter for orogastric (OG) tube placement   . Acute respiratory failure ([place])   . Abdominal wall seroma ([place]) 11/23/2016  . S/P hernia surgery 01/17/2016  . Adenocarcinoma of colon ([place]) 11/10/2014  . Obesity 11/10/2014  . Acute pulmonary embolism ([place]) 10/26/2014  . Iron deficiency anemia 10/26/2014  . Cecal lesion 10/25/2014  . Anxiety state 10/25/2014  . Absolute anemia   . GERD (gastroesophageal reflux disease) 10/10/2014  . Microcytic anemia 10/09/2014  . Diabetes ([place]) 10/09/2014  . Hypertension 10/09/2014    Consultants [facility]  Imaging: CT abdomen pelvis w contrast 11/25/16: 1. Large left paramedian abdominal wall hernia containing multiple dilated loops of small bowel, compatible with small bowel obstruction. No evidence of pneumoperitoneum or abscess. 2. Mild bibasilar atelectasis  Procedures -Dr. Brantley Stage (11/29/16) - Exploratory laparotomy with closure of abdomen using  Strattice biological mesh and placement of wound VAC -Dr. Kieth Brightly (11/26/16) - closure of lateral abdominal fascial for 20cm in length, opening of midline fascia -Dr. Grandville Silos (11/25/16) - EXPLORATORY LAPAROTOMY AND PLACEMENT OF OPEN ABDOMEN VAC -Dr. Brantley Stage (11/23/16) - Removal of mesh and repair of incisional hernia primary with transversus abdominis  release  Hospital Course:  Krystal Campbell is a 56yo female who was admitted to [facility]/23/16 for further treatment of her chronic abdominal wall seroma complicating hernia repair. Patient failed nonoperative management and decided to proceed with additional surgery.  She underwent Removal of mesh and repair of incisional hernia primary with transversus abdominis release on 11/23/16. Unfortunately she developed a recurrent incisional hernia with SBO requiring emergent exploratory laparotomy and placement of temporary wound vac 11/25/16. CCM was consulted for assistance with post-op mechanical ventilation. That evening her abdomen became more distended concerning for reherniation of bowel above fascia and she was therefore taken back to the OR for closure of lateral abdominal fascia with open abdomen wound vac 11/26/16. She returned to the OR once more 11/29/16 for closure of abdomen and wound vac placement. Following this procedure she was weaned from the vent. She was started on TNA for nutritional support. As ileus resolved NG tube was removed and diet slowly advanced as tolerated. Patient worked with therapies during this admission. On 9/14 the patient was voiding well, tolerating diet, ambulating well, pain well controlled, vital signs stable, incisions c/d/i and felt stable for discharge to SNF.  She will be discharged with abdominal wound vac. Patient will follow up in our office with Dr. Brantley Stage in about 2 weeks and knows to call with questions or concerns.   I have personally reviewed the patients medication history on the [place] controlled substance database.    Physical Exam: Gen:  Alert, NAD, pleasant HEENT: EOM's intact, pupils equal and round Card:  RRR, no M/G/R heard Pulm:  CTAB, no W/R/R, effort normal Psych:  A&Ox3  Skin: no rashes noted, warm and dry Abd: obese, soft, NT/ND, +BS, midline wound as below      Allergies as of 12/08/2016   No Known Allergies     Medication List    STOP taking  these medications   gabapentin 300 MG capsule Commonly known as:  NEURONTIN Replaced by:  gabapentin 600 MG tablet     TAKE these medications   acetaminophen 325 MG tablet Commonly known as:  TYLENOL Take 2 tablets (650 mg total) by mouth every 6 (six) hours as needed for mild pain or moderate pain.   albuterol (2.5 MG/3ML) 0.083% nebulizer solution Commonly known as:  PROVENTIL Take 3 mLs (2.5 mg total) by nebulization every 6 (six) hours as needed for wheezing or shortness of breath.   aspirin EC 81 MG tablet Take 81 mg by mouth daily.   Black Cohosh 40 MG Caps Take 40 mg by mouth 2 (two) times daily.   chlorhexidine gluconate (MEDLINE KIT) 0.12 % solution Commonly known as:  PERIDEX 15 mLs by Mouth Rinse route 2 (two) times daily.   Chlorhexidine Gluconate Cloth 2 % Pads Apply 6 each topically daily.   citalopram 20 MG tablet Commonly known as:  CELEXA Take 20 mg by mouth 3 (three) times daily.   vitamin B-12 500 MCG tablet Commonly known as:  CYANOCOBALAMIN Take 500 mcg by mouth daily.   cyanocobalamin 1000 MCG tablet Take 1 tablet (1,000 mcg total) by mouth daily.   docusate sodium 100 MG capsule Commonly known as:  COLACE Take 100 mg by mouth 2 (two) times daily.   feeding supplement (ENSURE ENLIVE) Liqd Take 237 mLs by mouth 2 (two) times daily between meals.   FIBER PO Take 1 tablet by mouth 2 (two) times daily.   gabapentin 600 MG tablet Commonly known as:  NEURONTIN Take 1 tablet (600 mg total) by mouth 3 (three) times daily. Replaces:  gabapentin 300 MG capsule   heparin 5000 UNIT/ML injection Inject 1 mL (5,000 Units total) into the skin every 8 (eight) hours.   ibuprofen 800 MG tablet Commonly known as:  ADVIL,MOTRIN Take 1 tablet (800 mg total) by mouth every 8 (eight) hours as needed.   loratadine 10 MG tablet Commonly known as:  CLARITIN Take 1 tablet (10 mg total) by mouth daily.   Melatonin 10 MG Caps Take 20 mg by mouth at  bedtime.   metFORMIN 500 MG tablet Commonly known as:  GLUCOPHAGE Take 500 mg by mouth 2 (two) times daily with a meal.   mouth rinse Liqd solution 15 mLs by Mouth Rinse route 2 times daily at 12 noon and 4 pm.   ondansetron 4 MG/2ML Soln injection Commonly known as:  ZOFRAN Inject 2 mLs (4 mg total) into the vein every 6 (six) hours as needed for nausea.   oxyCODONE 5 MG immediate release tablet Commonly known as:  Oxy IR/ROXICODONE Take 1 tablet (5 mg total) by mouth every 6 (six) hours as needed for moderate pain or severe pain. What changed:  how much to take  reasons to take this   oxyCODONE-acetaminophen 5-325 MG tablet Commonly known as:  ROXICET Take 1 tablet by mouth every 4 (four) hours as needed.   pantoprazole 40 MG tablet Commonly known as:  PROTONIX Take 1 tablet (40 mg total) by mouth 2 (two) times daily.   polyethylene glycol packet Commonly known as:  MIRALAX / GLYCOLAX Take 17 g by mouth 2 (two) times daily.  Discharge Care Instructions        Start     Ordered   12/08/16 0000  acetaminophen (TYLENOL) 325 MG tablet  Every 6 hours PRN     12/08/16 0840   12/08/16 0000  albuterol (PROVENTIL) (2.5 MG/3ML) 0.083% nebulizer solution  Every 6 hours PRN     12/08/16 0840   12/08/16 0000  chlorhexidine gluconate, MEDLINE KIT, (PERIDEX) 0.12 % solution  2 times daily     12/08/16 0840   12/08/16 0000  Chlorhexidine Gluconate Cloth 2 % PADS  Daily     12/08/16 0840   12/08/16 0000  feeding supplement, ENSURE ENLIVE, (ENSURE ENLIVE) LIQD  2 times daily between meals     12/08/16 0840   12/08/16 0000  gabapentin (NEURONTIN) 600 MG tablet  3 times daily     12/08/16 0840   12/08/16 0000  loratadine (CLARITIN) 10 MG tablet  Daily     12/08/16 0840   12/08/16 0000  mouth rinse LIQD solution  2 times daily     12/08/16 0840   12/08/16 0000  ondansetron (ZOFRAN) 4 MG/2ML SOLN injection  Every 6 hours PRN     12/08/16 0840   12/08/16 0000   oxyCODONE (OXY IR/ROXICODONE) 5 MG immediate release tablet  Every 6 hours PRN     12/08/16 0840   12/08/16 0000  polyethylene glycol (MIRALAX / GLYCOLAX) packet  2 times daily     12/08/16 0840   12/08/16 0000  heparin 5000 UNIT/ML injection  Every 8 hours     12/08/16 0840   11/23/16 0000  oxyCODONE-acetaminophen (ROXICET) 5-325 MG tablet  Every 4 hours PRN     11/23/16 1131        Contact information for follow-up providers    Erroll Luna, MD. Go on 12/18/2016.   Specialty:  General Surgery Why:  Your appointment is 12/18/16 at 9:10AM. Please arrive 15 minutes prior to your appointment to check in. Contact information: [address] [place] [address] [phone]            Contact information for after-discharge care    [facility] Follow up.   Specialty:  [place] information: [place] [address] [phone]                  Signed: Wellington Hampshire, [facility] Surgery 12/07/2016, 4:05 PM Pager: [phone] Consults: [phone] Mon-Fri 7:00 am-4:30 pm Sat-Sun 7:00 am-11:30 am

## 2016-12-07 NOTE — Progress Notes (Signed)
Pt requested for anxiety pill at this time. Paged MD. Francee Gentile .5mg  one dose given per pt request.

## 2016-12-08 LAB — GLUCOSE, CAPILLARY
GLUCOSE-CAPILLARY: 125 mg/dL — AB (ref 65–99)
GLUCOSE-CAPILLARY: 140 mg/dL — AB (ref 65–99)
GLUCOSE-CAPILLARY: 157 mg/dL — AB (ref 65–99)
Glucose-Capillary: 146 mg/dL — ABNORMAL HIGH (ref 65–99)

## 2016-12-08 MED ORDER — POLYETHYLENE GLYCOL 3350 17 G PO PACK: 17.0000 g | PACK | Freq: Two times a day (BID) | ORAL | 0 refills | Status: DC

## 2016-12-08 MED ORDER — HEPARIN SODIUM (PORCINE) 5000 UNIT/ML IJ SOLN
5000.0000 [IU] | Freq: Three times a day (TID) | INTRAMUSCULAR | 0 refills | Status: DC
Start: 2016-12-08 — End: 2020-01-20

## 2016-12-08 MED ORDER — CHLORHEXIDINE GLUCONATE CLOTH 2 % EX PADS: 6.0000 | MEDICATED_PAD | Freq: Every day | CUTANEOUS | Status: DC

## 2016-12-08 MED ORDER — ENSURE ENLIVE PO LIQD: 237.0000 mL | Freq: Two times a day (BID) | ORAL | 12 refills | Status: DC

## 2016-12-08 MED ORDER — MUSCLE RUB 10-15 % EX CREA
1.0000 "application " | TOPICAL_CREAM | CUTANEOUS | Status: DC | PRN
  Filled 2016-12-08: qty 85

## 2016-12-08 MED ORDER — OXYCODONE HCL 5 MG PO TABS: 5.0000 mg | ORAL_TABLET | Freq: Four times a day (QID) | ORAL | 0 refills | Status: DC | PRN

## 2016-12-08 MED ORDER — GABAPENTIN 600 MG PO TABS: 600.0000 mg | ORAL_TABLET | Freq: Three times a day (TID) | ORAL | Status: DC

## 2016-12-08 MED ORDER — ORAL CARE MOUTH RINSE: 15.0000 mL | Freq: Two times a day (BID) | OROMUCOSAL | 0 refills | Status: DC

## 2016-12-08 MED ORDER — ACETAMINOPHEN 325 MG PO TABS: 650.0000 mg | ORAL_TABLET | Freq: Four times a day (QID) | ORAL | Status: DC | PRN

## 2016-12-08 MED ORDER — ALBUTEROL SULFATE (2.5 MG/3ML) 0.083% IN NEBU: 2.5000 mg | INHALATION_SOLUTION | Freq: Four times a day (QID) | RESPIRATORY_TRACT | 12 refills | Status: DC | PRN

## 2016-12-08 MED ORDER — LORATADINE 10 MG PO TABS: 10.0000 mg | ORAL_TABLET | Freq: Every day | ORAL | Status: AC

## 2016-12-08 MED ORDER — CHLORHEXIDINE GLUCONATE 0.12% ORAL RINSE (MEDLINE KIT): 15.0000 mL | Freq: Two times a day (BID) | OROMUCOSAL | 0 refills | Status: DC

## 2016-12-08 MED ORDER — ONDANSETRON HCL 4 MG/2ML IJ SOLN: 4.0000 mg | Freq: Four times a day (QID) | INTRAMUSCULAR | 0 refills | Status: DC | PRN

## 2016-12-08 MED ORDER — GUAIFENESIN-DM 100-10 MG/5ML PO SYRP: 5.0000 mL | ORAL_SOLUTION | ORAL | Status: DC | PRN

## 2016-12-08 NOTE — Progress Notes (Signed)
Pt feels a lot better today. Wound vac dressing changed by [facility] this morning. Discharge pt to SNF. A copy of discharge instructions was given to pt's daughter. Attempted report to [facility] facility but nurse was not avail. Pt's daughter whose an LPN that works in that facility transported her.

## 2016-12-08 NOTE — Progress Notes (Signed)
Patient will discharge to [facility] SNF Anticipated discharge date: 9/14 Family notified: pt dtr Transportation by Dtr- plans to come around 12-1pm  Report #: [phone]   CSW signing off.  Jorge Ny, LCSW Clinical Social Worker [phone]

## 2016-12-08 NOTE — Progress Notes (Signed)
Occupational Therapy Treatment Patient Details Name: Krystal Campbell MRN: [id number] DOB: [birth year] Today's Date: 12/08/2016    History of present illness Patient is a 56 y/o female admitted with Recurrent incisional hernia, S/p Removal of mesh and repair of incisional hernia primary with transversus abdominis release, 11/23/16, S/p EXPLORATORY LAPAROTOMY AND PLACEMENT OF OPEN ABDOMEN VAC, 11/25/16, S/p  closure of lateral abdominal fascial for 20cm in length, opening of midline fascia, 11/26/16, S/p Exploratory laparotomy with closure of abdomen using  Strattice biological mesh and placement of wound VAC, 11/29/16, and acute respiratory failure following procedure requiring mechanical ventilation, was extubated 11/30/15   OT comments  Pt making progress with functional goals, OT will continue to follow acutely  Follow Up Recommendations  SNF;Supervision/Assistance - 24 hour    Equipment Recommendations  None recommended by OT    Recommendations for Other Services      Precautions / Restrictions Precautions Precaution Comments: wound vac abdominal wound Restrictions Weight Bearing Restrictions: No       Mobility Bed Mobility Overal bed mobility: Needs Assistance Bed Mobility: Rolling;Sidelying to Sit Rolling: Supervision Sidelying to sit: Supervision;HOB elevated   Sit to supine: Supervision      Transfers Overall transfer level: Needs assistance Equipment used: Rolling walker (2 wheeled) Transfers: Sit to/from Stand Sit to Stand: Min guard              Balance Overall balance assessment: Needs assistance Sitting-balance support: Feet supported;Bilateral upper extremity supported Sitting balance-Leahy Scale: Good     Standing balance support: Bilateral upper extremity supported;During functional activity Standing balance-Leahy Scale: Fair                             ADL either performed or assessed with clinical judgement   ADL Overall ADL's : Needs  assistance/impaired     Grooming: Standing;Min guard   Upper Body Bathing: Minimal assistance;Sitting               Toilet Transfer: Ambulation;RW;BSC;Min guard   Toileting- Water quality scientist and Hygiene: Min guard;Sit to/from stand       Functional mobility during ADLs: Min guard;Rolling walker       Vision Baseline Vision/History: Wears glasses Wears Glasses: At all times Patient Visual Report: No change from baseline     Perception     Praxis      Cognition Arousal/Alertness: Awake/alert Behavior During Therapy: WFL for tasks assessed/performed Overall Cognitive Status: Within Functional Limits for tasks assessed                                                      General Comments  pt pleasant, cooperative, talkative    Pertinent Vitals/ Pain       Pain Assessment: 0-10 Pain Score: 5  Pain Location: abdomen Pain Descriptors / Indicators: Sore;Grimacing;Operative site guarding;Guarding Pain Intervention(s): Limited activity within patient's tolerance;Monitored during session;Repositioned;Relaxation  Home Living                                          Prior Functioning/Environment              Frequency  Min 2X/week        Progress  Toward Goals  OT Goals(current goals can now be found in the care plan section)  Progress towards OT goals: Progressing toward goals     Plan Discharge plan remains appropriate    Co-evaluation                 AM-PAC PT "6 Clicks" Daily Activity     Outcome Measure   Help from another person eating meals?: None Help from another person taking care of personal grooming?: A Little Help from another person toileting, which includes using toliet, bedpan, or urinal?: A Little Help from another person bathing (including washing, rinsing, drying)?: A Little Help from another person to put on and taking off regular upper body clothing?: A Little Help from  another person to put on and taking off regular lower body clothing?: A Little 6 Click Score: 19    End of Session Equipment Utilized During Treatment: Gait belt;Rolling walker;Other (comment) (BSC)  OT Visit Diagnosis: Unsteadiness on feet (R26.81);Other abnormalities of gait and mobility (R26.89);Muscle weakness (generalized) (M62.81);Pain Pain - Right/Left:  (abdomen) Pain - part of body:  (abdomen)   Activity Tolerance Patient tolerated treatment well   Patient Left with call bell/phone within reach;in chair   Nurse Communication      Functional Assessment Tool Used: AM-PAC 6 Clicks Daily Activity   Time: 5830-9407 OT Time Calculation (min): 25 min  Charges: OT G-codes **NOT FOR INPATIENT CLASS** Functional Assessment Tool Used: AM-PAC 6 Clicks Daily Activity OT General Charges $OT Visit: 1 Visit OT Treatments $Self Care/Home Management : 8-22 mins $Therapeutic Activity: 8-22 mins     Britt Bottom 12/08/2016, 10:29 AM

## 2016-12-08 NOTE — Clinical Social Work Placement (Signed)
   CLINICAL SOCIAL WORK PLACEMENT  NOTE  Date:  12/08/2016  Patient Details  Name: Krystal Campbell MRN: [id number] Date of Birth: 04/06/1960  Clinical Social Work is seeking post-discharge placement for this patient at the [place] level of care (*CSW will initial, date and re-position this form in  chart as items are completed):  Yes   Patient/family provided with [facility] Work Department's list of facilities offering this level of care within the geographic area requested by the patient (or if unable, by the patient's family).  Yes   Patient/family informed of their freedom to choose among providers that offer the needed level of care, that participate in Medicare, Medicaid or managed care program needed by the patient, have an available bed and are willing to accept the patient.  Yes   Patient/family informed of [facility]'s ownership interest in [facility] and [facility], as well as of the fact that they are under no obligation to receive care at these facilities.  PASRR submitted to EDS on  (patient wanting placement in [place], no [place] necessary)     PASRR number received on       Existing PASRR number confirmed on       FL2 transmitted to all facilities in geographic area requested by pt/family on 12/03/16     FL2 transmitted to all facilities within larger geographic area on       Patient informed that his/her managed care company has contracts with or will negotiate with certain facilities, including the following:        Yes   Patient/family informed of bed offers received.  Patient chooses bed at [facility]     Physician recommends and patient chooses bed at      Patient to be transferred [facility] on 12/08/16.  Patient to be transferred to facility by family     Patient family notified on 12/08/16 of transfer.  Name of family member notified:  Samantha      PHYSICIAN Please sign FL2     Additional Comment:    _______________________________________________ Jorge Ny, LCSW 12/08/2016, 11:22 AM

## 2016-12-08 NOTE — Consult Note (Addendum)
[facility] Nurse wound consult note Pt is followed by the surgical team for plan of care for abd wound, PA at the bedside to assess wound appearance. Reason for Consult: Vac dressing change Wound type: Full thickness post-op wound to midline abd Wound bed: White allograft material visible to inner wound bed with sutures, surrounding wound edges beefy red.  Wound is unchanged in appearance from previous assessment. Drainage (amount, consistency, odor) Mod amt yellow drainage in the lower wound and the Vac cannister, some strong odor when the dressing was changed Periwound: Intact skin surrounding Dressing procedure/placement/frequency: Removed previous 1 piece of Vac sponge and 1 piece of white foam and nonadherent layer, then applied new nonadherent contact layer to inner wound, then 1 piece of white foam and 1piece black foam to 154mm cont suction. Pt tolerated with mod amt discomfort after pain meds were given. Pt plans to discharge to SNF soon. Julien Girt MSN, RN, [place], [place], [place]

## 2017-01-29 NOTE — Progress Notes (Signed)
Encounter done.

## 2017-04-07 ENCOUNTER — Other Ambulatory Visit: Payer: Self-pay | Admitting: Surgery

## 2017-04-07 DIAGNOSIS — R109 Unspecified abdominal pain: Secondary | ICD-10-CM

## 2017-04-12 ENCOUNTER — Ambulatory Visit
Admission: RE | Admit: 2017-04-12 | Discharge: 2017-04-12 | Disposition: A | Discharge status: Home / Self Care | Payer: Medicare PPO | Source: Ambulatory Visit | Attending: Surgery | Admitting: Surgery

## 2017-04-12 DIAGNOSIS — R109 Unspecified abdominal pain: Secondary | ICD-10-CM

## 2017-04-12 MED ORDER — IOPAMIDOL (ISOVUE-300) INJECTION 61%: 100.0000 mL | Freq: Once | INTRAVENOUS | Status: DC | PRN

## 2017-04-19 ENCOUNTER — Other Ambulatory Visit (HOSPITAL_COMMUNITY): Payer: Self-pay | Admitting: Surgery

## 2017-04-19 DIAGNOSIS — K6389 Other specified diseases of intestine: Secondary | ICD-10-CM

## 2017-05-03 ENCOUNTER — Ambulatory Visit (HOSPITAL_COMMUNITY)
Admission: RE | Admit: 2017-05-03 | Discharge: 2017-05-03 | Disposition: A | Discharge status: Home / Self Care | Payer: Medicare PPO | Source: Ambulatory Visit | Attending: Surgery | Admitting: Surgery

## 2017-05-03 DIAGNOSIS — C8593 Non-Hodgkin lymphoma, unspecified, intra-abdominal lymph nodes: Secondary | ICD-10-CM | POA: Insufficient documentation

## 2017-05-03 DIAGNOSIS — K6389 Other specified diseases of intestine: Secondary | ICD-10-CM

## 2017-05-03 DIAGNOSIS — C772 Secondary and unspecified malignant neoplasm of intra-abdominal lymph nodes: Secondary | ICD-10-CM | POA: Diagnosis not present

## 2017-05-03 DIAGNOSIS — R590 Localized enlarged lymph nodes: Secondary | ICD-10-CM | POA: Insufficient documentation

## 2017-05-03 DIAGNOSIS — K639 Disease of intestine, unspecified: Secondary | ICD-10-CM | POA: Insufficient documentation

## 2017-05-03 LAB — GLUCOSE, CAPILLARY: GLUCOSE-CAPILLARY: 137 mg/dL — AB (ref 65–99)

## 2017-05-03 MED ORDER — FLUDEOXYGLUCOSE F - 18 (FDG) INJECTION
12.5300 | Freq: Once | INTRAVENOUS | Status: AC | PRN
  Administered 2017-05-03: 12.53 via INTRAVENOUS

## 2017-05-07 ENCOUNTER — Other Ambulatory Visit (HOSPITAL_COMMUNITY): Payer: Self-pay | Admitting: Surgery

## 2017-05-07 DIAGNOSIS — R590 Localized enlarged lymph nodes: Secondary | ICD-10-CM

## 2018-10-17 ENCOUNTER — Other Ambulatory Visit (HOSPITAL_COMMUNITY): Payer: Self-pay | Admitting: Oncology

## 2018-10-17 DIAGNOSIS — C772 Secondary and unspecified malignant neoplasm of intra-abdominal lymph nodes: Secondary | ICD-10-CM

## 2018-10-17 DIAGNOSIS — C189 Malignant neoplasm of colon, unspecified: Secondary | ICD-10-CM

## 2018-10-24 ENCOUNTER — Other Ambulatory Visit: Payer: Self-pay

## 2018-10-24 ENCOUNTER — Ambulatory Visit (HOSPITAL_COMMUNITY)
Admission: RE | Admit: 2018-10-24 | Discharge: 2018-10-24 | Disposition: A | Discharge status: Home / Self Care | Payer: Medicare PPO | Source: Ambulatory Visit | Attending: Oncology | Admitting: Oncology

## 2018-10-24 DIAGNOSIS — C772 Secondary and unspecified malignant neoplasm of intra-abdominal lymph nodes: Secondary | ICD-10-CM | POA: Diagnosis present

## 2018-10-24 DIAGNOSIS — I700 Atherosclerosis of aorta: Secondary | ICD-10-CM | POA: Insufficient documentation

## 2018-10-24 DIAGNOSIS — C189 Malignant neoplasm of colon, unspecified: Secondary | ICD-10-CM | POA: Diagnosis present

## 2018-10-24 DIAGNOSIS — I2510 Atherosclerotic heart disease of native coronary artery without angina pectoris: Secondary | ICD-10-CM | POA: Diagnosis not present

## 2018-10-24 LAB — GLUCOSE, CAPILLARY: Glucose-Capillary: 177 mg/dL — ABNORMAL HIGH (ref 70–99)

## 2018-10-24 MED ORDER — FLUDEOXYGLUCOSE F - 18 (FDG) INJECTION
10.0000 | Freq: Once | INTRAVENOUS | Status: AC
  Administered 2018-10-24: 10 via INTRAVENOUS

## 2018-11-07 ENCOUNTER — Other Ambulatory Visit (HOSPITAL_COMMUNITY): Payer: Self-pay | Admitting: Oncology

## 2018-11-07 ENCOUNTER — Other Ambulatory Visit: Payer: Self-pay | Admitting: Oncology

## 2018-11-07 DIAGNOSIS — C772 Secondary and unspecified malignant neoplasm of intra-abdominal lymph nodes: Secondary | ICD-10-CM

## 2018-11-07 DIAGNOSIS — C189 Malignant neoplasm of colon, unspecified: Secondary | ICD-10-CM

## 2019-01-20 ENCOUNTER — Other Ambulatory Visit: Payer: Self-pay

## 2019-01-20 ENCOUNTER — Encounter (HOSPITAL_COMMUNITY)
Admission: RE | Admit: 2019-01-20 | Discharge: 2019-01-20 | Disposition: A | Discharge status: Home / Self Care | Payer: Medicare PPO | Source: Ambulatory Visit | Attending: Oncology | Admitting: Oncology

## 2019-01-20 DIAGNOSIS — C772 Secondary and unspecified malignant neoplasm of intra-abdominal lymph nodes: Secondary | ICD-10-CM | POA: Diagnosis present

## 2019-01-20 DIAGNOSIS — C189 Malignant neoplasm of colon, unspecified: Secondary | ICD-10-CM | POA: Diagnosis not present

## 2019-01-20 DIAGNOSIS — K8020 Calculus of gallbladder without cholecystitis without obstruction: Secondary | ICD-10-CM | POA: Insufficient documentation

## 2019-01-20 LAB — GLUCOSE, CAPILLARY: Glucose-Capillary: 126 mg/dL — ABNORMAL HIGH (ref 70–99)

## 2019-01-20 MED ORDER — FLUDEOXYGLUCOSE F - 18 (FDG) INJECTION
12.4000 | Freq: Once | INTRAVENOUS | Status: AC | PRN
  Administered 2019-01-20: 12.4 via INTRAVENOUS

## 2019-04-05 IMAGING — CT CT ABD-PELV W/ CM
1 of 3 series · 13 of 32 positions shown, 18 images · IV contrast (APPLIED)
Comparison: CT scan of July 28, 2016.

CLINICAL DATA: Status post seroma drainage.

EXAM:
CT ABDOMEN AND PELVIS WITH CONTRAST
TECHNIQUE: Multidetector CT imaging of the abdomen and pelvis was performed
using the standard protocol following bolus administration of
intravenous contrast.
CONTRAST:  125mL IHZTEZ-OAA IOPAMIDOL (IHZTEZ-OAA) INJECTION 61%

[Series 2: abd/pelvis w/cm · axial · 0.98mm/px · z∈[-596,-50]mm · 13 of 123 slices shown, 18 images]
[im 7/123  soft-tissue]
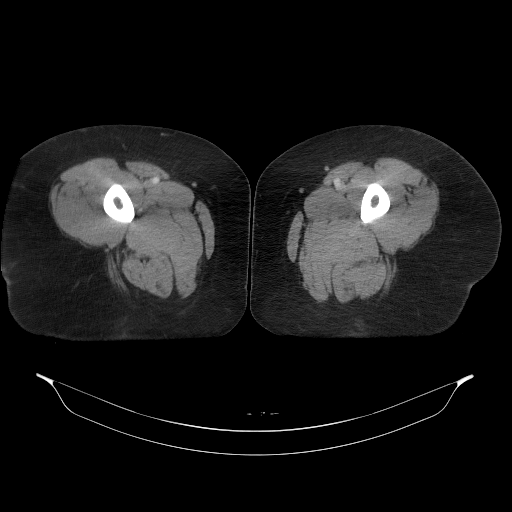
[im 7/123  bone]
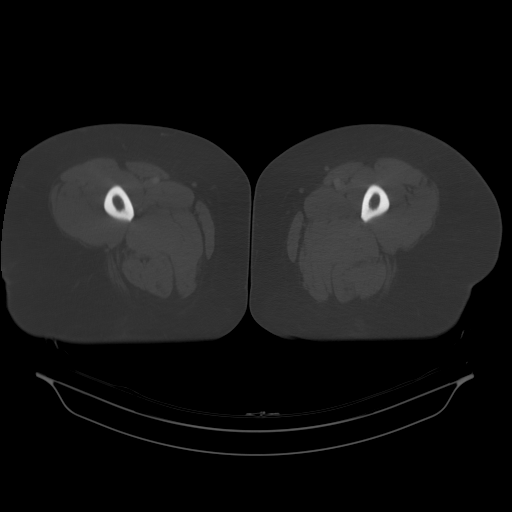
[im 19/123  soft-tissue]
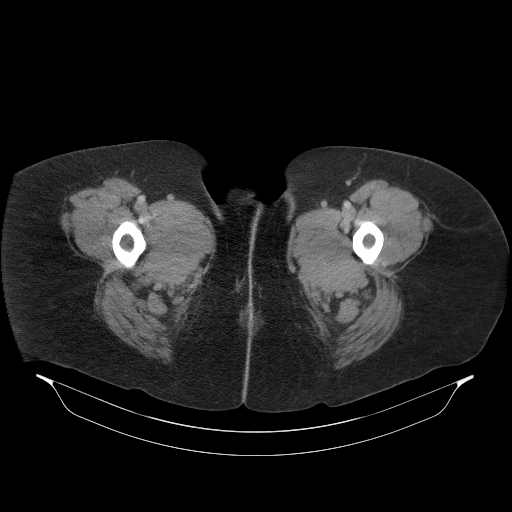
[im 25/123  soft-tissue]
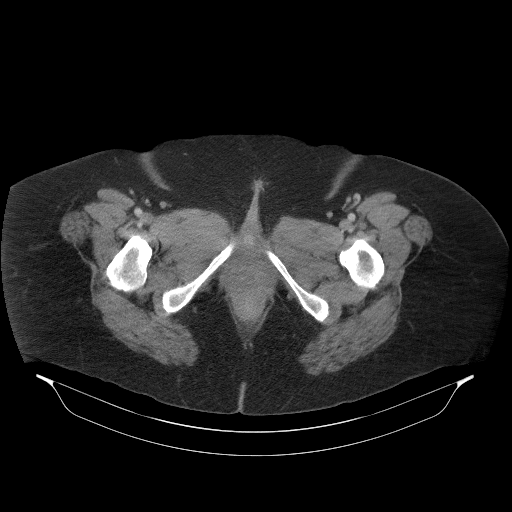
[im 37/123  soft-tissue]
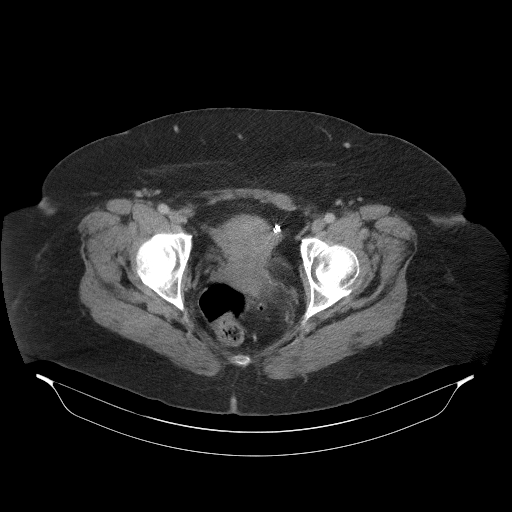
[im 49/123  soft-tissue]
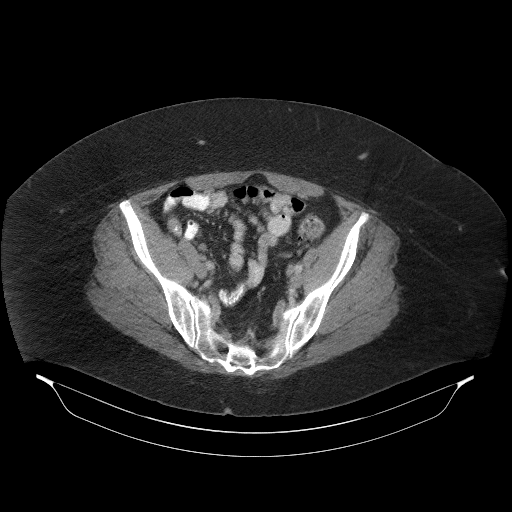
[im 55/123  soft-tissue]
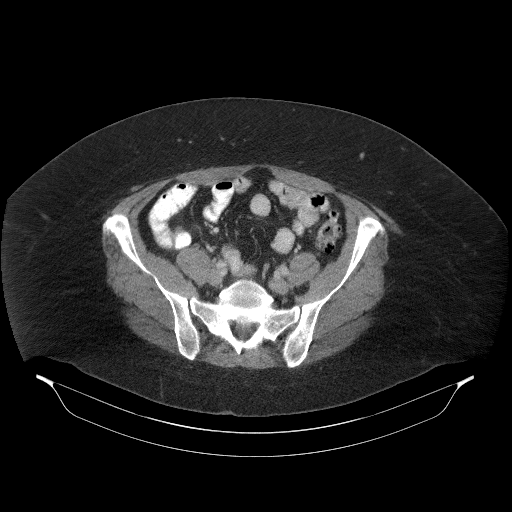
[im 68/123  soft-tissue]
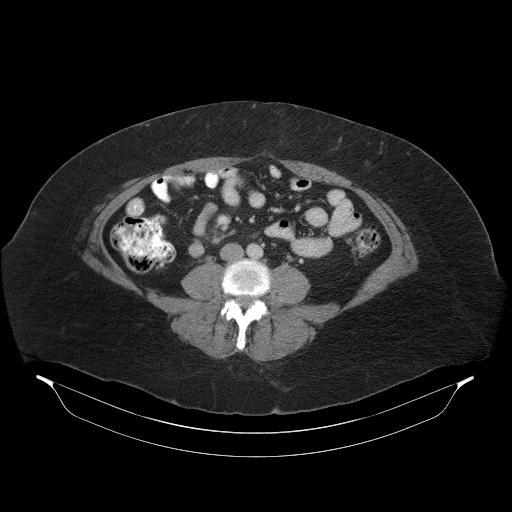
[im 74/123  soft-tissue]
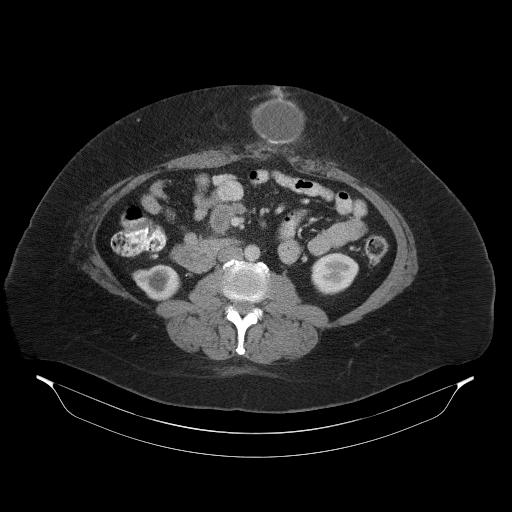
[im 86/123  soft-tissue]
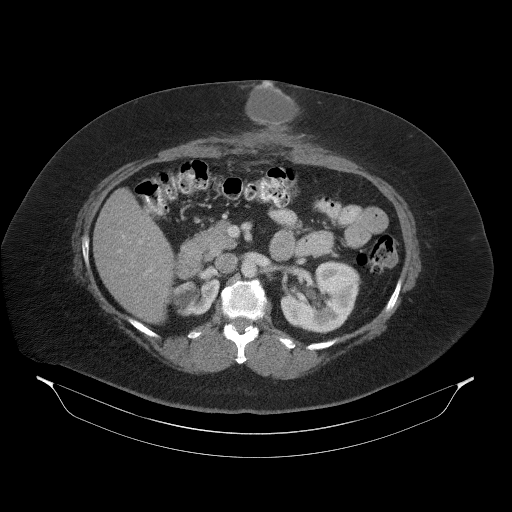
[im 86/123  bone]
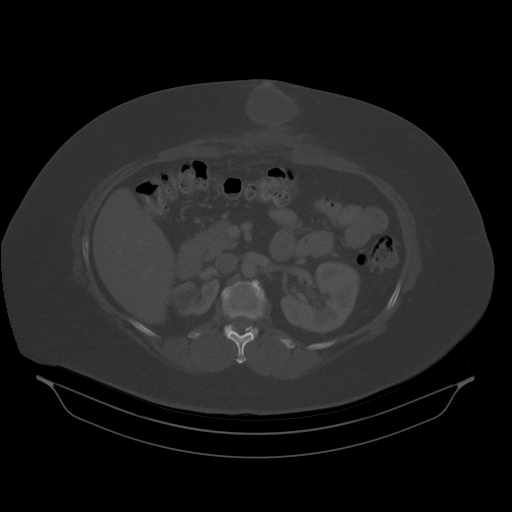
[im 98/123  soft-tissue]
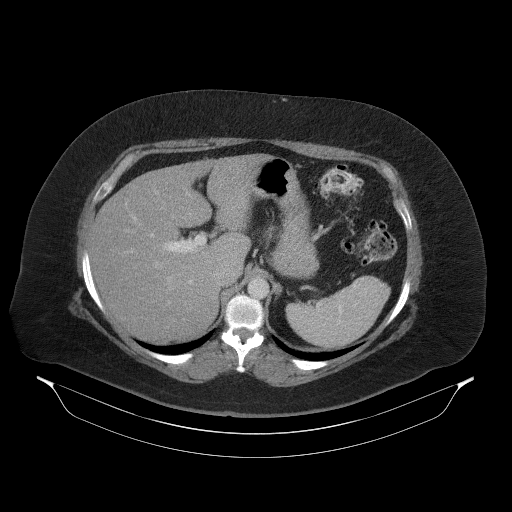
[im 98/123  lung]
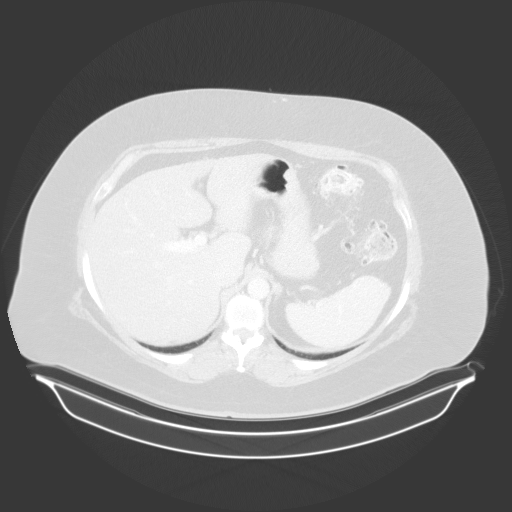
[im 104/123  soft-tissue]
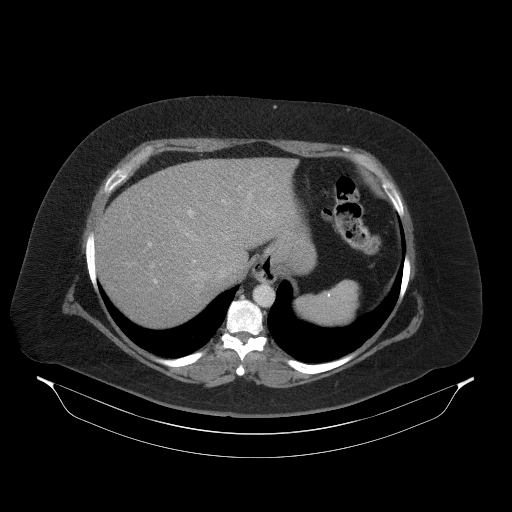
[im 104/123  lung]
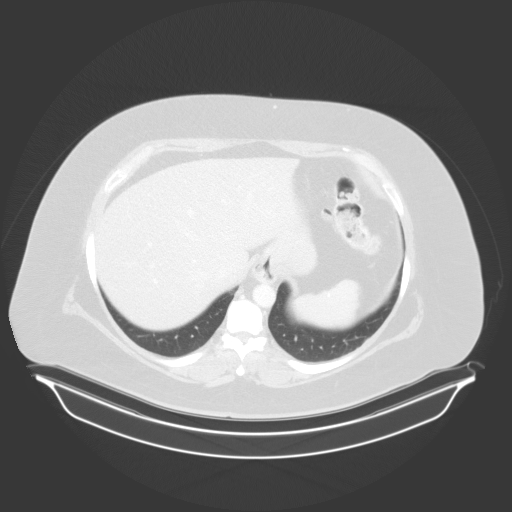
[im 110/123  lung]
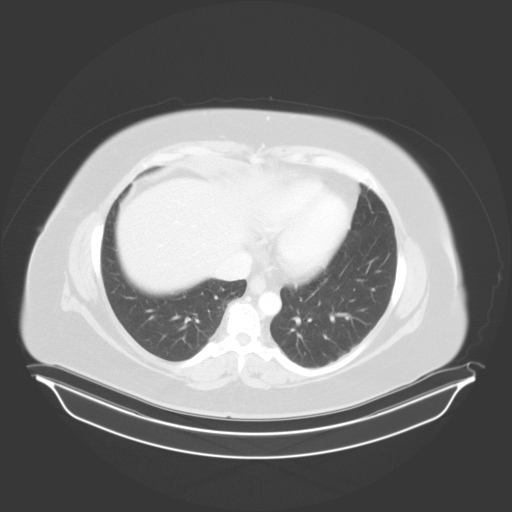
[im 116/123  soft-tissue]
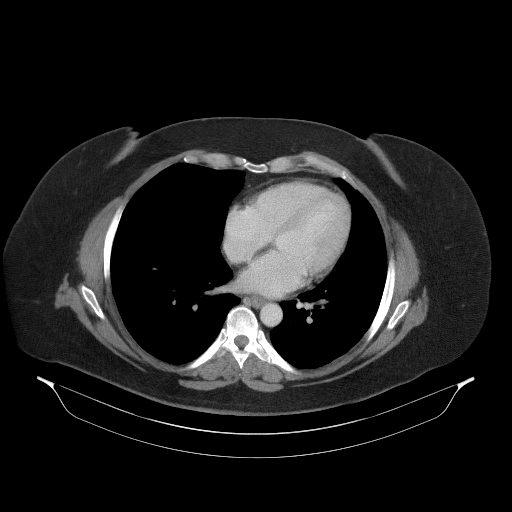
[im 116/123  lung]
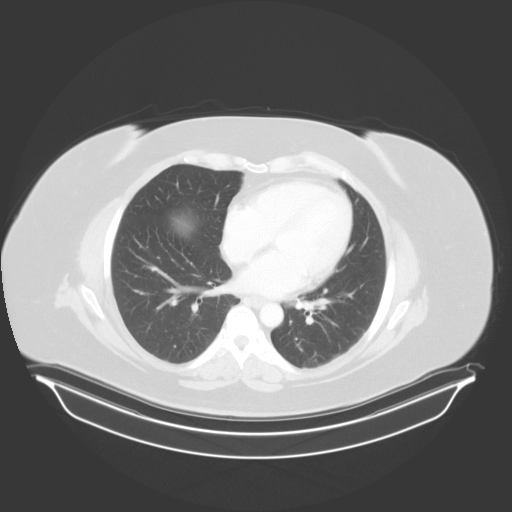

[13 of 32 positions shown; findings below may reference images not displayed]

FINDINGS: Lower chest: No acute abnormality.

Hepatobiliary: No focal liver abnormality is seen. No gallstones,
gallbladder wall thickening, or biliary dilatation.

Pancreas: Unremarkable. No pancreatic ductal dilatation or
surrounding inflammatory changes.

Spleen: Stable calcified splenic granulomata are noted.

Adrenals/Urinary Tract: Adrenal glands appear normal. Stable
postsurgical changes are seen involving the right kidney. Left
kidney appears normal. No hydronephrosis or renal obstruction is
noted. No renal or ureteral calculi are noted. Urinary bladder is
unremarkable.

Stomach/Bowel: The stomach appears normal. Diverticulosis of
descending and sigmoid colon is noted without inflammation. There is
no evidence of bowel obstruction. The appendix is not visualized.

Vascular/Lymphatic: No significant vascular findings are present. No
enlarged abdominal or pelvic lymph nodes.

Reproductive: Uterus and bilateral adnexa are unremarkable.
Bilateral ligation clips are noted.

Other: The postoperative seroma seen along anterior abdominal wall
prior exam is significantly smaller, currently measuring 9.6 x 4.2 x
3.2 cm. However, there is the interval development of another seroma
of immediately anterior to the previously described 1. This seroma
measures 9.1 x 7.0 x 4.7 cm.

Musculoskeletal: No acute or significant osseous findings.
IMPRESSION: Stable postoperative changes are seen involving the right kidney.

Diverticulosis of descending and sigmoid colon is noted without
inflammation.

Anterior abdominal wall seroma noted on previous cm is significantly
smaller, currently measuring 9.6 x 4.2 x 3.2 cm. However, there is
interval development of a new seroma immediately anterior to the
previously described seroma, measuring 9.1 x 7.0 x 4.7 cm.

## 2019-12-02 ENCOUNTER — Other Ambulatory Visit: Payer: Medicare PPO | Admitting: Adult Health

## 2020-01-12 ENCOUNTER — Other Ambulatory Visit: Payer: Medicare PPO | Admitting: Adult Health

## 2020-01-20 ENCOUNTER — Ambulatory Visit (INDEPENDENT_AMBULATORY_CARE_PROVIDER_SITE_OTHER): Payer: Medicare PPO | Admitting: Adult Health

## 2020-01-20 ENCOUNTER — Encounter: Payer: Self-pay | Admitting: Adult Health

## 2020-01-20 ENCOUNTER — Other Ambulatory Visit (HOSPITAL_COMMUNITY)
Admission: RE | Admit: 2020-01-20 | Discharge: 2020-01-20 | Disposition: A | Discharge status: Home / Self Care | Payer: Medicare PPO | Source: Ambulatory Visit | Attending: Adult Health | Admitting: Adult Health

## 2020-01-20 VITALS — BP 121/70 | HR 83 | Ht 64.0 in | Wt 253.0 lb

## 2020-01-20 DIAGNOSIS — Z1151 Encounter for screening for human papillomavirus (HPV): Secondary | ICD-10-CM | POA: Diagnosis not present

## 2020-01-20 DIAGNOSIS — Z1211 Encounter for screening for malignant neoplasm of colon: Secondary | ICD-10-CM | POA: Diagnosis not present

## 2020-01-20 DIAGNOSIS — Z01419 Encounter for gynecological examination (general) (routine) without abnormal findings: Secondary | ICD-10-CM | POA: Insufficient documentation

## 2020-01-20 DIAGNOSIS — N3941 Urge incontinence: Secondary | ICD-10-CM | POA: Diagnosis not present

## 2020-01-20 DIAGNOSIS — C187 Malignant neoplasm of sigmoid colon: Secondary | ICD-10-CM | POA: Diagnosis not present

## 2020-01-20 DIAGNOSIS — R87810 Cervical high risk human papillomavirus (HPV) DNA test positive: Secondary | ICD-10-CM | POA: Insufficient documentation

## 2020-01-20 LAB — HEMOCCULT GUIAC POC 1CARD (OFFICE): Fecal Occult Blood, POC: NEGATIVE

## 2020-01-20 NOTE — Progress Notes (Signed)
Patient ID: Krystal Campbell, female   DOB: [birth year], 59 y.o.   MRN: [id number] History of Present Illness:  Krystal Campbell is a 59 year old white female, widowed, PM in for a well woman gyn exam and pap. She has history of colon cancer with surgery and chemo. PCP is Dr Bartolo Darter.  Current Medications, Allergies, Past Medical History, Past Surgical History, Family History and Social History were reviewed in [employer] record.     Review of Systems: Patient denies any headaches, hearing loss, fatigue, blurred vision, shortness of breath, chest pain, abdominal pain, problems with bowel movements,  or intercourse(not active). No joint pain or mood swings. Has urinary incontinence at times and usually pees about 4-5 x a day.     Physical Exam:BP 121/70 (BP Location: Left Arm, Patient Position: Sitting, Cuff Size: Large)   Pulse 83   Ht 5\' 4"  (1.626 m)   Wt 253 lb (114.8 kg)   BMI 43.43 kg/m  General:  Well developed, well nourished, no acute distress Skin:  Warm and dry Neck:  Midline trachea, normal thyroid, good ROM, no lymphadenopathy Lungs; Clear to auscultation bilaterally Breast:  No dominant palpable mass, retraction, or nipple discharge Cardiovascular: Regular rate and rhythm Abdomen:  Soft, mildly  Tender over mesh, no hepatosplenomegaly,has numerous surgical scars Pelvic:  External genitalia is normal in appearance, no lesions.  The vagina is pale with loss of moisture and rugae. Urethra has no lesions or masses. The cervix is smooth,pap with high risk HPV genotyping performed.  Uterus is felt to be normal size, shape, and contour.  No adnexal masses or tenderness noted.Bladder is non tender, no masses felt. Rectal: Good sphincter tone, no polyps, or hemorrhoids felt.  Hemoccult negative. Extremities/musculoskeletal:  No swelling or varicosities noted, no clubbing or cyanosis Psych:  No mood changes, alert and cooperative,seems happy AA is  2 Fall risk is low \\PHQ  9  score is 14, no SI is on meds  [place] - 01/20/20 0936      Pregnancy Intention Screening   Does the patient want to become pregnant in the next year? N/A    Does the patient's partner want to become pregnant in the next year? N/A    Would the patient like to discuss contraceptive options today? N/A      Contraception Wrap Up   Current Method --   PM   End Method --   PM   Contraception Counseling Provided No         Examination chaperoned by Celene Squibb LPN  Impression and Plan: 1. Well woman exam with routine gynecological exam Physical yearly  2. Encounter for gynecological examination with Papanicolaou smear of cervix Pap sent Physical in 1 year She wants paps yearly Mammogram yearly Colonoscopy per GI  Labs with PCP.   3. Encounter for screening fecal occult blood testing   4. Urge incontinence of urine Try to void every 2-3 hours   5. Malignant neoplasm of sigmoid colon [facility]) Keep follow up with oncology

## 2020-01-23 LAB — CYTOLOGY - PAP
Adequacy: ABSENT
Comment: NEGATIVE
Comment: NEGATIVE
Diagnosis: NEGATIVE
HPV 16: NEGATIVE
HPV 18 / 45: NEGATIVE
High risk HPV: POSITIVE — AB

## 2020-01-26 ENCOUNTER — Telehealth: Payer: Self-pay | Admitting: *Deleted

## 2020-01-26 NOTE — Telephone Encounter (Signed)
Pt reports that last pap was in [place]. Normal per patient. 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 Advised patient we will keep an eye out for records and if there is anything different that she needs to do I will call her.

## 2020-01-26 NOTE — Telephone Encounter (Signed)
-----   Message from Roma Schanz, [place] sent at 01/26/2020  1:50 PM EDT ----- Will you let her know pap neg, but +HRHPV. I do not see any other paps in her chart. Can you find out when/where last pap was. Any h/o abnormal paps? How long ago. Thanks

## 2021-01-21 ENCOUNTER — Ambulatory Visit (INDEPENDENT_AMBULATORY_CARE_PROVIDER_SITE_OTHER): Payer: Medicare PPO | Admitting: Adult Health

## 2021-01-21 ENCOUNTER — Encounter: Payer: Self-pay | Admitting: Adult Health

## 2021-01-21 ENCOUNTER — Other Ambulatory Visit: Payer: Self-pay

## 2021-01-21 ENCOUNTER — Other Ambulatory Visit (HOSPITAL_COMMUNITY)
Admission: RE | Admit: 2021-01-21 | Discharge: 2021-01-21 | Disposition: A | Discharge status: Home / Self Care | Payer: Medicare PPO | Source: Ambulatory Visit | Attending: Adult Health | Admitting: Adult Health

## 2021-01-21 VITALS — BP 124/79 | HR 80 | Ht 64.0 in | Wt 246.0 lb

## 2021-01-21 DIAGNOSIS — Z01419 Encounter for gynecological examination (general) (routine) without abnormal findings: Secondary | ICD-10-CM | POA: Insufficient documentation

## 2021-01-21 DIAGNOSIS — Z1151 Encounter for screening for human papillomavirus (HPV): Secondary | ICD-10-CM | POA: Insufficient documentation

## 2021-01-21 DIAGNOSIS — Z1211 Encounter for screening for malignant neoplasm of colon: Secondary | ICD-10-CM | POA: Diagnosis not present

## 2021-01-21 DIAGNOSIS — C187 Malignant neoplasm of sigmoid colon: Secondary | ICD-10-CM

## 2021-01-21 DIAGNOSIS — Z780 Asymptomatic menopausal state: Secondary | ICD-10-CM

## 2021-01-21 DIAGNOSIS — R87810 Cervical high risk human papillomavirus (HPV) DNA test positive: Secondary | ICD-10-CM

## 2021-01-21 LAB — HEMOCCULT GUIAC POC 1CARD (OFFICE): Fecal Occult Blood, POC: NEGATIVE

## 2021-01-21 NOTE — Progress Notes (Signed)
Patient ID: Krystal Campbell, female   DOB: 1960/06/04, 60 y.o.   MRN: [id number] History of Present Illness: Krystal Campbell is a 60 year old white female, divorced, PM in for well woman gyn exam and pap. Pap last year +HPV Lab Results  Component Value Date   DIAGPAP  01/20/2020    - Negative for intraepithelial lesion or malignancy (NILM)   HPVHIGH Positive (A) 01/20/2020    PCP is Dr Bartolo Darter at [facility].  Current Medications, Allergies, Past Medical History, Past Surgical History, Family History and Social History were reviewed in [employer] record.     Review of Systems:  Patient denies any headaches, hearing loss, fatigue, blurred vision, shortness of breath, chest pain, abdominal pain, problems with bowel movements, urination, or intercourse.(Not active). No joint pain or mood swings.  Denies any vaginal bleeding   Physical Exam:BP 124/79 (BP Location: Right Arm, Patient Position: Sitting, Cuff Size: Large)   Pulse 80   Ht 5\' 4"  (1.626 m)   Wt 246 lb (111.6 kg)   BMI 42.23 kg/m   General:  Well developed, well nourished, no acute distress Skin:  Warm and dry Neck:  Midline trachea, normal thyroid, good ROM, no lymphadenopathy,no carotid bruits heard Lungs; Clear to auscultation bilaterally Breast:  No dominant palpable mass, retraction, or nipple discharge,has port a cath above left breast  Cardiovascular: Regular rate and rhythm Abdomen:  Soft, no hepatosplenomegaly,has some tenderness esp to right  Pelvic:  External genitalia is normal in appearance, no lesions.  The vagina is pale with loss of rugae. Urethra has no lesions or masses. The cervix is smooth, pap with HR HPV genotyping performed.  Uterus is felt to be normal size, shape, and contour.  No adnexal masses or tenderness noted.Bladder is non tender, no masses felt. Rectal: Good sphincter tone, no polyps, or hemorrhoids felt.  Hemoccult negative. Extremities/musculoskeletal:  No swelling or varicosities noted, no  clubbing or cyanosis Psych:  No mood changes, alert and cooperative,seems happy AA is 1 Fall risk is low Depression screen [facility] 2/9 01/21/2021 01/20/2020  Decreased Interest 2 2  Down, Depressed, Hopeless 3 2  PHQ - 2 Score 5 4  Altered sleeping 2 3  Tired, decreased energy 2 3  Change in appetite 2 2  Feeling bad or failure about yourself  3 1  Trouble concentrating 3 0  Moving slowly or fidgety/restless 0 1  Suicidal thoughts 0 0  PHQ-9 Score 17 14    She is off celexa, will follow up with oncologist next week, he took her off GAD 7 : Generalized Anxiety Score 01/21/2021 01/20/2020  Nervous, Anxious, on Edge 0 1  Control/stop worrying 0 0  Worry too much - different things 0 1  Trouble relaxing 2 1  Restless 0 0  Easily annoyed or irritable 0 0  Afraid - awful might happen 0 0  Total GAD 7 Score 2 3      Upstream - 01/21/21 1050       Pregnancy Intention Screening   Does the patient want to become pregnant in the next year? N/A    Does the patient's partner want to become pregnant in the next year? N/A    Would the patient like to discuss contraceptive options today? N/A      Contraception Wrap Up   Current Method Female Sterilization   pm   End Method Female Sterilization   pm   Contraception Counseling Provided No  Examination chaperoned by Celene Squibb LPN   Impression and Plan:  1. Encounter for gynecological examination with Papanicolaou smear of cervix Pap sent Physical in 1 year Pap in 3 if normal Mammogram yearly Labs with PCP  Colonoscopy per GI   2. Encounter for screening fecal occult blood testing  3. Malignant neoplasm of sigmoid colon [facility]) She had CT yesterday  4. Papanicolaou smear of cervix with positive high risk human papilloma virus (HPV) test Pap sent   5. Postmenopause

## 2021-01-27 LAB — CYTOLOGY - PAP
Comment: NEGATIVE
Diagnosis: UNDETERMINED — AB
High risk HPV: NEGATIVE

## 2021-01-28 ENCOUNTER — Telehealth: Payer: Self-pay | Admitting: Adult Health

## 2021-01-28 ENCOUNTER — Encounter: Payer: Self-pay | Admitting: Adult Health

## 2021-01-28 DIAGNOSIS — R87610 Atypical squamous cells of undetermined significance on cytologic smear of cervix (ASC-US): Secondary | ICD-10-CM

## 2021-01-28 HISTORY — DX: Atypical squamous cells of undetermined significance on cytologic smear of cervix (ASC-US): R87.610

## 2021-01-28 NOTE — Telephone Encounter (Signed)
Pt aware that pap ASCUS but negative HPV repeat in 1 year

## 2022-01-23 ENCOUNTER — Other Ambulatory Visit (HOSPITAL_COMMUNITY)
Admission: RE | Admit: 2022-01-23 | Discharge: 2022-01-23 | Disposition: A | Discharge status: Home / Self Care | Payer: Medicare PPO | Source: Ambulatory Visit | Attending: Adult Health | Admitting: Adult Health

## 2022-01-23 ENCOUNTER — Ambulatory Visit (INDEPENDENT_AMBULATORY_CARE_PROVIDER_SITE_OTHER): Payer: Medicare PPO | Admitting: Adult Health

## 2022-01-23 ENCOUNTER — Encounter: Payer: Self-pay | Admitting: Adult Health

## 2022-01-23 VITALS — BP 120/82 | HR 95 | Ht 64.0 in | Wt 231.5 lb

## 2022-01-23 DIAGNOSIS — N3941 Urge incontinence: Secondary | ICD-10-CM

## 2022-01-23 DIAGNOSIS — Z1211 Encounter for screening for malignant neoplasm of colon: Secondary | ICD-10-CM

## 2022-01-23 DIAGNOSIS — Z1151 Encounter for screening for human papillomavirus (HPV): Secondary | ICD-10-CM | POA: Diagnosis not present

## 2022-01-23 DIAGNOSIS — Z01419 Encounter for gynecological examination (general) (routine) without abnormal findings: Secondary | ICD-10-CM

## 2022-01-23 DIAGNOSIS — R87610 Atypical squamous cells of undetermined significance on cytologic smear of cervix (ASC-US): Secondary | ICD-10-CM | POA: Diagnosis not present

## 2022-01-23 DIAGNOSIS — Z780 Asymptomatic menopausal state: Secondary | ICD-10-CM

## 2022-01-23 LAB — HEMOCCULT GUIAC POC 1CARD (OFFICE): Fecal Occult Blood, POC: NEGATIVE

## 2022-01-23 NOTE — Progress Notes (Signed)
Patient ID: Krystal Campbell, female   DOB: [birth year], 61 y.o.   MRN: [id number] History of Present Illness: Krystal Campbell is a 61 year old white female, widowed, PM in for a well woman gyn exam and pap. 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  Last pap was 01/21/22 ASCUS negative HPV.  PCP is Dr Bartolo Darter.  Current Medications, Allergies, Past Medical History, Past Surgical History, Family History and Social History were reviewed in [employer] record.     Review of Systems: Patient denies any headaches, hearing loss, fatigue, blurred vision, shortness of breath, chest pain, abdominal pain, problems with bowel movements, urination,(mild urge incontinence at times) or intercourse.(Not active). No joint pain or mood swings.  Denies any vaginal bleeding    Physical Exam:BP 120/82 (BP Location: Left Arm, Patient Position: Sitting, Cuff Size: Normal)   Pulse 95   Ht '5\' 4"'$  (1.626 m)   Wt 231 lb 8 oz (105 kg)   BMI 39.74 kg/m   General:  Well developed, well nourished, no acute distress Skin:  Warm and dry Neck:  Midline trachea, normal thyroid, good ROM, no lymphadenopathy,no carotid bruits heard  Lungs; Clear to auscultation bilaterally Breast:  No dominant palpable mass, retraction, or nipple discharge, she still has port a cath about left breast. Cardiovascular: Regular rate and rhythm Abdomen:  Soft, non tender, no hepatosplenomegaly, a little sore when palpated she says  Pelvic:  External genitalia is normal in appearance, no lesions.  The vagina is pale with loss of rugae. Urethra has no lesions or masses. The cervix is smooth, pap with HR HPV genotyping performed.  Uterus is felt to be normal size, shape, and contour.  No adnexal masses or tenderness noted.Bladder is non tender, no masses felt. Rectal: Good sphincter tone, no polyps, or hemorrhoids felt.  Hemoccult negative. Extremities/musculoskeletal:  No swelling or varicosities noted, no clubbing or  cyanosis Psych:  No mood changes, alert and cooperative,seems happy AA is 1 Fall risk is moderate    01/23/2022   10:33 AM 01/21/2021   10:48 AM 01/20/2020    9:33 AM  Depression screen PHQ 2/9  Decreased Interest '1 2 2  '$ Down, Depressed, Hopeless '2 3 2  '$ PHQ - 2 Score '3 5 4  '$ Altered sleeping '1 2 3  '$ Tired, decreased energy '2 2 3  '$ Change in appetite '2 2 2  '$ Feeling bad or failure about yourself  0 3 1  Trouble concentrating 0 3 0  Moving slowly or fidgety/restless 0 0 1  Suicidal thoughts 0 0 0  PHQ-9 Score '8 17 14   '$ She is on Celexa and ativan     01/23/2022   10:33 AM 01/21/2021   10:50 AM 01/20/2020    9:34 AM  GAD 7 : Generalized Anxiety Score  Nervous, Anxious, on Edge 1 0 1  Control/stop worrying 0 0 0  Worry too much - different things 0 0 1  Trouble relaxing '1 2 1  '$ Restless 0 0 0  Easily annoyed or irritable 0 0 0  Afraid - awful might happen 0 0 0  Total GAD 7 Score '2 2 3    '$ Upstream - 01/23/22 1028       Pregnancy Intention Screening   Does the patient want to become pregnant in the next year? No    Does the patient's partner want to become pregnant in the next year? No    Would the patient like to discuss contraceptive options today? No  Contraception Wrap Up   Current Method Female Sterilization    End Method Female Sterilization    Contraception Counseling Provided No            Examination chaperoned by Levy Pupa LPN    Impression and Plan:  1. Encounter for gynecological examination with Papanicolaou smear of cervix Pap sent Pap in 3 years if normal Physical in 1 year Mammogram was negative 01/12/22  - Cytology - PAP( [place]) Colonoscopy due 2026  2. ASCUS of cervix with negative high risk HPV Pap sent   3. Urge incontinence of urine Mild, she waits too long sometimes, when busy   4. Encounter for screening fecal occult blood testing Hemoccult was negative  - POCT occult blood stool  5. Postmenopause Denies any  vaginal bleeding

## 2022-01-25 LAB — CYTOLOGY - PAP
Adequacy: ABSENT
Comment: NEGATIVE
Diagnosis: UNDETERMINED — AB
High risk HPV: NEGATIVE

## 2022-02-06 NOTE — Progress Notes (Signed)
Surgery orders requested via Epic inbox. °

## 2022-02-10 NOTE — Patient Instructions (Signed)
DUE TO COVID-19 ONLY TWO VISITORS  (aged 61 and older)  ARE ALLOWED TO COME WITH YOU AND STAY IN THE WAITING ROOM ONLY DURING PRE OP AND PROCEDURE.   **NO VISITORS ARE ALLOWED IN THE SHORT STAY AREA OR RECOVERY ROOM!!**  IF YOU WILL BE ADMITTED INTO THE HOSPITAL YOU ARE ALLOWED ONLY FOUR SUPPORT PEOPLE DURING VISITATION HOURS ONLY (7 AM -8PM)   The support person(s) must pass our screening, gel in and out, and wear a mask at all times, including in the patient's room. Patients must also wear a mask when staff or their support person are in the room. Visitors GUEST BADGE MUST BE WORN VISIBLY  One adult visitor may remain with you overnight and MUST be in the room by 8 P.M.     Your procedure is scheduled on: 02/23/22   Report to [facility] Main Entrance    Report to admitting at : 9:00 AM   Call this number if you have problems the morning of surgery [phone]   Do not eat food :After Midnight.   After Midnight you may have the following liquids until : 8:30 AM DAY OF SURGERY  Water Black Coffee (sugar ok, NO MILK/CREAM OR CREAMERS)  Tea (sugar ok, NO MILK/CREAM OR CREAMERS) regular and decaf                             Plain Jell-O (NO RED)                                           Fruit ices (not with fruit pulp, NO RED)                                     Popsicles (NO RED)                                                                  Juice: apple, WHITE grape, WHITE cranberry Sports drinks like Gatorade (NO RED)               The day of surgery:  Drink ONE (1) Pre-Surgery Clear Ensure or G2 at: 8:30 AM the morning of surgery. Drink in one sitting. Do not sip.  This drink was given to you during your hospital  pre-op appointment visit. Nothing else to drink after completing the  Pre-Surgery Clear Ensure or G2.          If you have questions, please contact your surgeon's office     Oral Hygiene is also important to reduce your risk of infection.                                     Remember - BRUSH YOUR TEETH THE MORNING OF SURGERY WITH YOUR REGULAR TOOTHPASTE   Do NOT smoke after Midnight   Take these medicines the morning of surgery with A SIP OF WATER: gabapentin,citalopram.Loratadine,famotidine as needed. How to Manage Your Diabetes Before  and After Surgery  Why is it important to control my blood sugar before and after surgery? Improving blood sugar levels before and after surgery helps healing and can limit problems. A way of improving blood sugar control is eating a healthy diet by:  Eating less sugar and carbohydrates  Increasing activity/exercise  Talking with your doctor about reaching your blood sugar goals High blood sugars (greater than 180 mg/dL) can raise your risk of infections and slow your recovery, so you will need to focus on controlling your diabetes during the weeks before surgery. Make sure that the doctor who takes care of your diabetes knows about your planned surgery including the date and location.  How do I manage my blood sugar before surgery? Check your blood sugar at least 4 times a day, starting 2 days before surgery, to make sure that the level is not too high or low. Check your blood sugar the morning of your surgery when you wake up and every 2 hours until you get to the Short Stay unit. If your blood sugar is less than 70 mg/dL, you will need to treat for low blood sugar: Do not take insulin. Treat a low blood sugar (less than 70 mg/dL) with  cup of clear juice (cranberry or apple), 4 glucose tablets, OR glucose gel. Recheck blood sugar in 15 minutes after treatment (to make sure it is greater than 70 mg/dL). If your blood sugar is not greater than 70 mg/dL on recheck, call [phone] for further instructions. Report your blood sugar to the short stay nurse when you get to Short Stay.  If you are admitted to the hospital after surgery: Your blood sugar will be checked by the staff and you will probably be  given insulin after surgery (instead of oral diabetes medicines) to make sure you have good blood sugar levels. The goal for blood sugar control after surgery is 80-180 mg/dL.   WHAT DO I DO ABOUT MY DIABETES MEDICATION?  Do not take oral diabetes medicines (pills) the morning of surgery.  THE NIGHT BEFORE SURGERY, take metformin as usual.      THE MORNING OF SURGERY, DO NOT TAKE ANY ORAL DIABETIC MEDICATIONS DAY OF YOUR SURGERY  DO NOT TAKE THE FOLLOWING 7 DAYS PRIOR TO SURGERY: Ozempic, Wegovy, Rybelsus (Semaglutide), Byetta (exenatide), Bydureon (exenatide ER), Victoza, Saxenda (liraglutide), or Trulicity (dulaglutide) Mounjaro (Tirzepatide) Adlyxin (Lixisenatide), Polyethylene Glycol Loxenatide.                              You may not have any metal on your body including hair pins, jewelry, and body piercing             Do not wear make-up, lotions, powders, perfumes/cologne, or deodorant  Do not wear nail polish including gel and S&S, artificial/acrylic nails, or any other type of covering on natural nails including finger and toenails. If you have artificial nails, gel coating, etc. that needs to be removed by a nail salon please have this removed prior to surgery or surgery may need to be canceled/ delayed if the surgeon/ anesthesia feels like they are unable to be safely monitored.   Do not shave  48 hours prior to surgery.    Do not bring valuables to the hospital. [place].   Contacts, dentures or bridgework may not be worn  into surgery.   Bring small overnight bag day of surgery.   DO NOT [place]. PHARMACY WILL DISPENSE MEDICATIONS LISTED ON YOUR MEDICATION LIST TO YOU DURING YOUR ADMISSION [place]!    Patients discharged on the day of surgery will not be allowed to drive home.  Someone NEEDS to stay with you for the first 24 hours after anesthesia.   Special Instructions:  Bring a copy of your healthcare power of attorney and living will documents         the day of surgery if you haven't scanned them before.              Please read over the following fact sheets you were given: IF YOU HAVE QUESTIONS ABOUT YOUR PRE-OP INSTRUCTIONS PLEASE CALL [phone]    [facility] - Preparing for Surgery Before surgery, you can play an important role.  Because skin is not sterile, your skin needs to be as free of germs as possible.  You can reduce the number of germs on your skin by washing with CHG (chlorahexidine gluconate) soap before surgery.  CHG is an antiseptic cleaner which kills germs and bonds with the skin to continue killing germs even after washing. Please DO NOT use if you have an allergy to CHG or antibacterial soaps.  If your skin becomes reddened/irritated stop using the CHG and inform your nurse when you arrive at Short Stay. Do not shave (including legs and underarms) for at least 48 hours prior to the first CHG shower.  You may shave your face/neck. Please follow these instructions carefully:  1.  Shower with CHG Soap the night before surgery and the  morning of Surgery.  2.  If you choose to wash your hair, wash your hair first as usual with your  normal  shampoo.  3.  After you shampoo, rinse your hair and body thoroughly to remove the  shampoo.                           4.  Use CHG as you would any other liquid soap.  You can apply chg directly  to the skin and wash                       Gently with a scrungie or clean washcloth.  5.  Apply the CHG Soap to your body ONLY FROM THE NECK DOWN.   Do not use on face/ open                           Wound or open sores. Avoid contact with eyes, ears mouth and genitals (private parts).                       Wash face,  Genitals (private parts) with your normal soap.             6.  Wash thoroughly, paying special attention to the area where your surgery  will be performed.  7.  Thoroughly rinse your body with  warm water from the neck down.  8.  DO NOT shower/wash with your normal soap after using and rinsing off  the CHG Soap.                9.  Pat yourself dry with a clean towel.  10.  Wear clean pajamas.            11.  Place clean sheets on your bed the night of your first shower and do not  sleep with pets. Day of Surgery : Do not apply any lotions/deodorants the morning of surgery.  Please wear clean clothes to the hospital/surgery center.  FAILURE TO FOLLOW THESE INSTRUCTIONS MAY RESULT IN THE CANCELLATION OF YOUR SURGERY PATIENT SIGNATURE_________________________________  NURSE SIGNATURE__________________________________  ________________________________________________________________________  Adam Phenix  An incentive spirometer is a tool that can help keep your lungs clear and active. This tool measures how well you are filling your lungs with each breath. Taking long deep breaths may help reverse or decrease the chance of developing breathing (pulmonary) problems (especially infection) following: A long period of time when you are unable to move or be active. BEFORE THE PROCEDURE  If the spirometer includes an indicator to show your best effort, your nurse or respiratory therapist will set it to a desired goal. If possible, sit up straight or lean slightly forward. Try not to slouch. Hold the incentive spirometer in an upright position. INSTRUCTIONS FOR USE  Sit on the edge of your bed if possible, or sit up as far as you can in bed or on a chair. Hold the incentive spirometer in an upright position. Breathe out normally. Place the mouthpiece in your mouth and seal your lips tightly around it. Breathe in slowly and as deeply as possible, raising the piston or the ball toward the top of the column. Hold your breath for 3-5 seconds or for as long as possible. Allow the piston or ball to fall to the bottom of the column. Remove the mouthpiece from your mouth and  breathe out normally. Rest for a few seconds and repeat Steps 1 through 7 at least 10 times every 1-2 hours when you are awake. Take your time and take a few normal breaths between deep breaths. The spirometer may include an indicator to show your best effort. Use the indicator as a goal to work toward during each repetition. After each set of 10 deep breaths, practice coughing to be sure your lungs are clear. If you have an incision (the cut made at the time of surgery), support your incision when coughing by placing a pillow or rolled up towels firmly against it. Once you are able to get out of bed, walk around indoors and cough well. You may stop using the incentive spirometer when instructed by your caregiver.  RISKS AND COMPLICATIONS Take your time so you do not get dizzy or light-headed. If you are in pain, you may need to take or ask for pain medication before doing incentive spirometry. It is harder to take a deep breath if you are having pain. AFTER USE Rest and breathe slowly and easily. It can be helpful to keep track of a log of your progress. Your caregiver can provide you with a simple table to help with this. If you are using the spirometer at home, follow these instructions: [place] IF:  You are having difficultly using the spirometer. You have trouble using the spirometer as often as instructed. Your pain medication is not giving enough relief while using the spirometer. You develop fever of 100.5 F (38.1 C) or higher. SEEK IMMEDIATE MEDICAL CARE IF:  You cough up bloody sputum that had not been present before. You develop fever of 102 F (38.9 C) or greater. You develop worsening pain at or near the  incision site. MAKE SURE YOU:  Understand these instructions. Will watch your condition. Will get help right away if you are not doing well or get worse. Document Released: 07/24/2006 Document Revised: 06/05/2011 Document Reviewed: 09/24/2006 [facility] Patient  Information 2014 [place], [place].   ________________________________________________________________________

## 2022-02-13 ENCOUNTER — Other Ambulatory Visit: Payer: Self-pay

## 2022-02-13 ENCOUNTER — Encounter (HOSPITAL_COMMUNITY): Payer: Self-pay

## 2022-02-13 ENCOUNTER — Encounter (HOSPITAL_COMMUNITY)
Admission: RE | Admit: 2022-02-13 | Discharge: 2022-02-13 | Disposition: A | Discharge status: Home / Self Care | Payer: Medicare PPO | Source: Ambulatory Visit | Attending: Orthopedic Surgery | Admitting: Orthopedic Surgery

## 2022-02-13 VITALS — BP 123/86 | HR 97 | Temp 98.7°F | Ht 64.0 in | Wt 227.0 lb

## 2022-02-13 DIAGNOSIS — E119 Type 2 diabetes mellitus without complications: Secondary | ICD-10-CM | POA: Insufficient documentation

## 2022-02-13 DIAGNOSIS — I10 Essential (primary) hypertension: Secondary | ICD-10-CM | POA: Diagnosis not present

## 2022-02-13 DIAGNOSIS — Z01818 Encounter for other preprocedural examination: Secondary | ICD-10-CM | POA: Diagnosis present

## 2022-02-13 DIAGNOSIS — Z794 Long term (current) use of insulin: Secondary | ICD-10-CM | POA: Diagnosis not present

## 2022-02-13 LAB — CBC
HCT: 42.2 % (ref 36.0–46.0)
Hemoglobin: 13.1 g/dL (ref 12.0–15.0)
MCH: 30.8 pg (ref 26.0–34.0)
MCHC: 31 g/dL (ref 30.0–36.0)
MCV: 99.1 fL (ref 80.0–100.0)
Platelets: 249 10*3/uL (ref 150–400)
RBC: 4.26 MIL/uL (ref 3.87–5.11)
RDW: 14.4 % (ref 11.5–15.5)
WBC: 5.1 10*3/uL (ref 4.0–10.5)
nRBC: 0 % (ref 0.0–0.2)

## 2022-02-13 LAB — BASIC METABOLIC PANEL
Anion gap: 9 (ref 5–15)
BUN: 13 mg/dL (ref 8–23)
CO2: 25 mmol/L (ref 22–32)
Calcium: 9.4 mg/dL (ref 8.9–10.3)
Chloride: 105 mmol/L (ref 98–111)
Creatinine, Ser: 1.2 mg/dL — ABNORMAL HIGH (ref 0.44–1.00)
GFR, Estimated: 52 mL/min — ABNORMAL LOW (ref >60)
Glucose, Bld: 127 mg/dL — ABNORMAL HIGH (ref 70–99)
Potassium: 4.9 mmol/L (ref 3.5–5.1)
Sodium: 139 mmol/L (ref 135–145)

## 2022-02-13 LAB — GLUCOSE, CAPILLARY: Glucose-Capillary: 141 mg/dL — ABNORMAL HIGH (ref 70–99)

## 2022-02-13 LAB — SURGICAL PCR SCREEN
MRSA, PCR: NEGATIVE
Staphylococcus aureus: NEGATIVE

## 2022-02-13 NOTE — Progress Notes (Addendum)
For Short Stay: [place] appointment date:  Bowel Prep reminder:   For Anesthesia: PCP - Dr. Vidal Schwalbe Cardiologist -   Chest x-ray -  EKG - 02/09/22:requested Stress Test -  ECHO - 10/27/14 Cardiac Cath -  Pacemaker/ICD device last checked: Pacemaker orders received: Device Rep notified:  Spinal Cord Stimulator:  Sleep Study -  CPAP -   Fasting Blood Sugar - 100's Checks Blood Sugar __2___ times a day Date and result of last Hgb A1c-  Last dose of GLP1 agonist- ozempic last dose: 01/06/22 GLP1 instructions:   Last dose of SGLT-2 inhibitors-  SGLT-2 instructions:   Blood Thinner Instructions: Aspirin Instructions: Last Dose:  Activity level: Can go up a flight of stairs and activities of daily living without stopping and without chest pain and/or shortness of breath   Able to exercise without chest pain and/or shortness of breath   Unable to go up a flight of stairs without chest pain and/or shortness of breath     Anesthesia review: Hx: DIA,CKD,PE  Patient denies shortness of breath, fever, cough and chest pain at PAT appointment   Patient verbalized understanding of instructions that were given to them at the PAT appointment. Patient was also instructed that they will need to review over the PAT instructions again at home before surgery.

## 2022-02-14 LAB — HEMOGLOBIN A1C
Hgb A1c MFr Bld: 6.3 % — ABNORMAL HIGH (ref 4.8–5.6)
Mean Plasma Glucose: 134 mg/dL

## 2022-02-22 ENCOUNTER — Encounter (HOSPITAL_COMMUNITY): Payer: Self-pay | Admitting: Orthopedic Surgery

## 2022-02-23 ENCOUNTER — Ambulatory Visit (HOSPITAL_COMMUNITY): Payer: Medicare PPO | Admitting: Certified Registered Nurse Anesthetist

## 2022-02-23 ENCOUNTER — Observation Stay (HOSPITAL_COMMUNITY)
Admission: RE | Admit: 2022-02-23 | Discharge: 2022-02-24 | Disposition: A | Discharge status: Home / Self Care | Payer: Medicare PPO | Attending: Orthopedic Surgery | Admitting: Orthopedic Surgery

## 2022-02-23 ENCOUNTER — Ambulatory Visit (HOSPITAL_BASED_OUTPATIENT_CLINIC_OR_DEPARTMENT_OTHER): Payer: Medicare PPO | Admitting: Certified Registered Nurse Anesthetist

## 2022-02-23 ENCOUNTER — Other Ambulatory Visit: Payer: Self-pay

## 2022-02-23 ENCOUNTER — Encounter (HOSPITAL_COMMUNITY): Payer: Self-pay | Admitting: Orthopedic Surgery

## 2022-02-23 ENCOUNTER — Encounter (HOSPITAL_COMMUNITY)
Admission: RE | Disposition: A | Discharge status: Home / Self Care | Payer: Self-pay | Source: Home / Self Care | Attending: Orthopedic Surgery

## 2022-02-23 DIAGNOSIS — Z87891 Personal history of nicotine dependence: Secondary | ICD-10-CM | POA: Diagnosis not present

## 2022-02-23 DIAGNOSIS — Z79899 Other long term (current) drug therapy: Secondary | ICD-10-CM | POA: Diagnosis not present

## 2022-02-23 DIAGNOSIS — Z86711 Personal history of pulmonary embolism: Secondary | ICD-10-CM | POA: Diagnosis not present

## 2022-02-23 DIAGNOSIS — I129 Hypertensive chronic kidney disease with stage 1 through stage 4 chronic kidney disease, or unspecified chronic kidney disease: Secondary | ICD-10-CM | POA: Insufficient documentation

## 2022-02-23 DIAGNOSIS — N183 Chronic kidney disease, stage 3 unspecified: Secondary | ICD-10-CM | POA: Diagnosis not present

## 2022-02-23 DIAGNOSIS — Z96652 Presence of left artificial knee joint: Secondary | ICD-10-CM

## 2022-02-23 DIAGNOSIS — Z96651 Presence of right artificial knee joint: Secondary | ICD-10-CM | POA: Diagnosis not present

## 2022-02-23 DIAGNOSIS — E1122 Type 2 diabetes mellitus with diabetic chronic kidney disease: Secondary | ICD-10-CM | POA: Insufficient documentation

## 2022-02-23 DIAGNOSIS — J189 Pneumonia, unspecified organism: Secondary | ICD-10-CM

## 2022-02-23 DIAGNOSIS — Z85038 Personal history of other malignant neoplasm of large intestine: Secondary | ICD-10-CM | POA: Diagnosis not present

## 2022-02-23 DIAGNOSIS — I10 Essential (primary) hypertension: Secondary | ICD-10-CM

## 2022-02-23 DIAGNOSIS — M1712 Unilateral primary osteoarthritis, left knee: Principal | ICD-10-CM | POA: Insufficient documentation

## 2022-02-23 HISTORY — PX: TOTAL KNEE ARTHROPLASTY: SHX125

## 2022-02-23 LAB — GLUCOSE, CAPILLARY
Glucose-Capillary: 133 mg/dL — ABNORMAL HIGH (ref 70–99)
Glucose-Capillary: 153 mg/dL — ABNORMAL HIGH (ref 70–99)
Glucose-Capillary: 200 mg/dL — ABNORMAL HIGH (ref 70–99)
Glucose-Capillary: 196 mg/dL — ABNORMAL HIGH (ref 70–99)
Glucose-Capillary: 171 mg/dL — ABNORMAL HIGH (ref 70–99)

## 2022-02-23 SURGERY — ARTHROPLASTY, KNEE, TOTAL
Anesthesia: Regional | Site: Knee | Laterality: Left

## 2022-02-23 MED ORDER — ACETAMINOPHEN 325 MG PO TABS: 325.0000 mg | ORAL_TABLET | Freq: Four times a day (QID) | ORAL | Status: DC | PRN

## 2022-02-23 MED ORDER — ONDANSETRON HCL 4 MG/2ML IJ SOLN
INTRAMUSCULAR | Status: DC | PRN
  Administered 2022-02-23: 4 mg via INTRAVENOUS

## 2022-02-23 MED ORDER — TRANEXAMIC ACID-NACL 1000-0.7 MG/100ML-% IV SOLN
1000.0000 mg | Freq: Once | INTRAVENOUS | Status: AC
  Administered 2022-02-23: 1000 mg via INTRAVENOUS
  Filled 2022-02-23: qty 100

## 2022-02-23 MED ORDER — METHOCARBAMOL 500 MG PO TABS
500.0000 mg | ORAL_TABLET | Freq: Four times a day (QID) | ORAL | Status: DC | PRN
  Administered 2022-02-23: 500 mg via ORAL
  Filled 2022-02-23: qty 1

## 2022-02-23 MED ORDER — KETOROLAC TROMETHAMINE 30 MG/ML IJ SOLN
INTRAMUSCULAR | Status: DC | PRN
  Administered 2022-02-23: 30 mg via INTRAMUSCULAR

## 2022-02-23 MED ORDER — CEFAZOLIN SODIUM-DEXTROSE 2-4 GM/100ML-% IV SOLN
2.0000 g | INTRAVENOUS | Status: AC
  Administered 2022-02-23: 2 g via INTRAVENOUS
  Filled 2022-02-23: qty 100

## 2022-02-23 MED ORDER — OXYCODONE HCL 5 MG PO TABS: 5.0000 mg | ORAL_TABLET | Freq: Once | ORAL | Status: DC | PRN

## 2022-02-23 MED ORDER — FENTANYL CITRATE (PF) 100 MCG/2ML IJ SOLN
INTRAMUSCULAR | Status: AC
  Filled 2022-02-23: qty 2

## 2022-02-23 MED ORDER — MIDAZOLAM HCL 2 MG/2ML IJ SOLN
INTRAMUSCULAR | Status: AC
  Filled 2022-02-23: qty 2

## 2022-02-23 MED ORDER — HYDROMORPHONE HCL 2 MG PO TABS
2.0000 mg | ORAL_TABLET | ORAL | Status: DC | PRN
  Administered 2022-02-24 (×2): 2 mg via ORAL
  Filled 2022-02-23 (×2): qty 1
  Filled 2022-02-23: qty 2

## 2022-02-23 MED ORDER — OXYCODONE HCL 5 MG/5ML PO SOLN: 5.0000 mg | Freq: Once | ORAL | Status: DC | PRN

## 2022-02-23 MED ORDER — OXYCODONE HCL 5 MG PO TABS
5.0000 mg | ORAL_TABLET | ORAL | Status: DC | PRN
  Administered 2022-02-23: 10 mg via ORAL
  Administered 2022-02-23: 5 mg via ORAL

## 2022-02-23 MED ORDER — PROPOFOL 500 MG/50ML IV EMUL
INTRAVENOUS | Status: DC | PRN
  Administered 2022-02-23: 100 ug/kg/min via INTRAVENOUS

## 2022-02-23 MED ORDER — PROPOFOL 1000 MG/100ML IV EMUL
INTRAVENOUS | Status: AC
  Filled 2022-02-23: qty 200

## 2022-02-23 MED ORDER — LORATADINE 10 MG PO TABS: 10.0000 mg | ORAL_TABLET | Freq: Every day | ORAL | Status: DC | PRN

## 2022-02-23 MED ORDER — SODIUM CHLORIDE (PF) 0.9 % IJ SOLN
INTRAMUSCULAR | Status: DC | PRN
  Administered 2022-02-23: 30 mL

## 2022-02-23 MED ORDER — DEXAMETHASONE SODIUM PHOSPHATE 10 MG/ML IJ SOLN
INTRAMUSCULAR | Status: DC | PRN
  Administered 2022-02-23: 8 mg via INTRAVENOUS

## 2022-02-23 MED ORDER — HYDROMORPHONE HCL 1 MG/ML IJ SOLN
INTRAMUSCULAR | Status: AC
  Filled 2022-02-23: qty 1

## 2022-02-23 MED ORDER — STERILE WATER FOR IRRIGATION IR SOLN
Status: DC | PRN
  Administered 2022-02-23: 1500 mL

## 2022-02-23 MED ORDER — SODIUM CHLORIDE (PF) 0.9 % IJ SOLN
INTRAMUSCULAR | Status: AC
  Filled 2022-02-23: qty 50

## 2022-02-23 MED ORDER — METHOCARBAMOL 500 MG IVPB - SIMPLE MED
500.0000 mg | Freq: Four times a day (QID) | INTRAVENOUS | Status: DC | PRN
  Administered 2022-02-23: 500 mg via INTRAVENOUS

## 2022-02-23 MED ORDER — BISACODYL 10 MG RE SUPP: 10.0000 mg | Freq: Every day | RECTAL | Status: DC | PRN

## 2022-02-23 MED ORDER — INSULIN ASPART 100 UNIT/ML IJ SOLN
INTRAMUSCULAR | Status: AC
  Filled 2022-02-23: qty 1

## 2022-02-23 MED ORDER — POLYETHYLENE GLYCOL 3350 17 G PO PACK: 17.0000 g | PACK | Freq: Two times a day (BID) | ORAL | Status: DC

## 2022-02-23 MED ORDER — BUPIVACAINE-EPINEPHRINE (PF) 0.25% -1:200000 IJ SOLN
INTRAMUSCULAR | Status: AC
  Filled 2022-02-23: qty 30

## 2022-02-23 MED ORDER — HYDROMORPHONE HCL 1 MG/ML IJ SOLN
0.5000 mg | INTRAMUSCULAR | Status: DC | PRN
  Administered 2022-02-23: 1 mg via INTRAVENOUS
  Filled 2022-02-23: qty 1

## 2022-02-23 MED ORDER — FENTANYL CITRATE (PF) 250 MCG/5ML IJ SOLN
INTRAMUSCULAR | Status: DC | PRN
  Administered 2022-02-23: 50 ug via INTRAVENOUS

## 2022-02-23 MED ORDER — CITALOPRAM HYDROBROMIDE 20 MG PO TABS
20.0000 mg | ORAL_TABLET | Freq: Three times a day (TID) | ORAL | Status: DC
  Administered 2022-02-23 – 2022-02-24 (×3): 20 mg via ORAL
  Filled 2022-02-23 (×3): qty 1

## 2022-02-23 MED ORDER — LACTATED RINGERS IV SOLN: INTRAVENOUS | Status: DC

## 2022-02-23 MED ORDER — METOCLOPRAMIDE HCL 5 MG/ML IJ SOLN: 5.0000 mg | Freq: Three times a day (TID) | INTRAMUSCULAR | Status: DC | PRN

## 2022-02-23 MED ORDER — RIVAROXABAN 10 MG PO TABS
10.0000 mg | ORAL_TABLET | Freq: Every day | ORAL | Status: DC
  Administered 2022-02-24: 10 mg via ORAL
  Filled 2022-02-23: qty 1

## 2022-02-23 MED ORDER — METHOCARBAMOL 500 MG IVPB - SIMPLE MED
INTRAVENOUS | Status: AC
  Filled 2022-02-23: qty 55

## 2022-02-23 MED ORDER — SODIUM CHLORIDE 0.9 % IV SOLN: INTRAVENOUS | Status: DC

## 2022-02-23 MED ORDER — CEFAZOLIN SODIUM-DEXTROSE 2-4 GM/100ML-% IV SOLN
2.0000 g | Freq: Four times a day (QID) | INTRAVENOUS | Status: AC
  Administered 2022-02-23 (×2): 2 g via INTRAVENOUS
  Filled 2022-02-23 (×2): qty 100

## 2022-02-23 MED ORDER — DEXAMETHASONE SODIUM PHOSPHATE 10 MG/ML IJ SOLN: 8.0000 mg | Freq: Once | INTRAMUSCULAR | Status: AC

## 2022-02-23 MED ORDER — OXYCODONE HCL 5 MG PO TABS
10.0000 mg | ORAL_TABLET | ORAL | Status: DC | PRN
  Filled 2022-02-23: qty 2

## 2022-02-23 MED ORDER — POVIDONE-IODINE 10 % EX SWAB
2.0000 | Freq: Once | CUTANEOUS | Status: AC
  Administered 2022-02-23: 2 via TOPICAL

## 2022-02-23 MED ORDER — PHENYLEPHRINE 80 MCG/ML (10ML) SYRINGE FOR IV PUSH (FOR BLOOD PRESSURE SUPPORT)
PREFILLED_SYRINGE | INTRAVENOUS | Status: DC | PRN
  Administered 2022-02-23: 80 ug via INTRAVENOUS
  Administered 2022-02-23: 40 ug via INTRAVENOUS
  Administered 2022-02-23 (×2): 80 ug via INTRAVENOUS
  Administered 2022-02-23: 40 ug via INTRAVENOUS
  Administered 2022-02-23 (×2): 80 ug via INTRAVENOUS

## 2022-02-23 MED ORDER — DEXAMETHASONE SODIUM PHOSPHATE 10 MG/ML IJ SOLN: 10.0000 mg | Freq: Once | INTRAMUSCULAR | Status: DC

## 2022-02-23 MED ORDER — SODIUM CHLORIDE 0.9 % IR SOLN
Status: DC | PRN
  Administered 2022-02-23: 1000 mL

## 2022-02-23 MED ORDER — HYDROMORPHONE HCL 1 MG/ML IJ SOLN
0.5000 mg | INTRAMUSCULAR | Status: DC | PRN
  Administered 2022-02-23: 0.5 mg via INTRAVENOUS

## 2022-02-23 MED ORDER — ACETAMINOPHEN 500 MG PO TABS
ORAL_TABLET | ORAL | Status: AC
  Administered 2022-02-23: 1000 mg
  Filled 2022-02-23: qty 2

## 2022-02-23 MED ORDER — 0.9 % SODIUM CHLORIDE (POUR BTL) OPTIME
TOPICAL | Status: DC | PRN
  Administered 2022-02-23: 1000 mL

## 2022-02-23 MED ORDER — ORAL CARE MOUTH RINSE: 15.0000 mL | OROMUCOSAL | Status: DC | PRN

## 2022-02-23 MED ORDER — AMITRIPTYLINE HCL 50 MG PO TABS
25.0000 mg | ORAL_TABLET | Freq: Every day | ORAL | Status: DC
  Filled 2022-02-23: qty 0.5

## 2022-02-23 MED ORDER — PROPOFOL 10 MG/ML IV BOLUS
INTRAVENOUS | Status: AC
  Filled 2022-02-23: qty 20

## 2022-02-23 MED ORDER — METOCLOPRAMIDE HCL 5 MG PO TABS: 5.0000 mg | ORAL_TABLET | Freq: Three times a day (TID) | ORAL | Status: DC | PRN

## 2022-02-23 MED ORDER — ONDANSETRON HCL 4 MG PO TABS: 4.0000 mg | ORAL_TABLET | Freq: Four times a day (QID) | ORAL | Status: DC | PRN

## 2022-02-23 MED ORDER — TRANEXAMIC ACID-NACL 1000-0.7 MG/100ML-% IV SOLN
1000.0000 mg | INTRAVENOUS | Status: AC
  Administered 2022-02-23: 1000 mg via INTRAVENOUS
  Filled 2022-02-23: qty 100

## 2022-02-23 MED ORDER — MIDAZOLAM HCL 2 MG/2ML IJ SOLN
INTRAMUSCULAR | Status: DC | PRN
  Administered 2022-02-23: 2 mg via INTRAVENOUS

## 2022-02-23 MED ORDER — PROPOFOL 10 MG/ML IV BOLUS
INTRAVENOUS | Status: DC | PRN
  Administered 2022-02-23: 20 mg via INTRAVENOUS

## 2022-02-23 MED ORDER — AMITRIPTYLINE HCL 25 MG PO TABS
25.0000 mg | ORAL_TABLET | Freq: Every day | ORAL | Status: DC
  Administered 2022-02-24: 25 mg via ORAL
  Filled 2022-02-23: qty 1

## 2022-02-23 MED ORDER — CHLORHEXIDINE GLUCONATE 0.12 % MT SOLN
15.0000 mL | Freq: Once | OROMUCOSAL | Status: AC
  Administered 2022-02-23: 15 mL via OROMUCOSAL

## 2022-02-23 MED ORDER — FAMOTIDINE 20 MG PO TABS: 20.0000 mg | ORAL_TABLET | Freq: Two times a day (BID) | ORAL | Status: DC | PRN

## 2022-02-23 MED ORDER — HYDROMORPHONE HCL 1 MG/ML IJ SOLN: 0.2500 mg | INTRAMUSCULAR | Status: DC | PRN

## 2022-02-23 MED ORDER — PHENOL 1.4 % MT LIQD: 1.0000 | OROMUCOSAL | Status: DC | PRN

## 2022-02-23 MED ORDER — OXYCODONE HCL 5 MG PO TABS
ORAL_TABLET | ORAL | Status: AC
  Filled 2022-02-23: qty 1

## 2022-02-23 MED ORDER — ACETAMINOPHEN 500 MG PO TABS
1000.0000 mg | ORAL_TABLET | Freq: Four times a day (QID) | ORAL | Status: AC
  Administered 2022-02-23 – 2022-02-24 (×4): 1000 mg via ORAL
  Filled 2022-02-23 (×4): qty 2

## 2022-02-23 MED ORDER — DIPHENHYDRAMINE HCL 12.5 MG/5ML PO ELIX: 12.5000 mg | ORAL_SOLUTION | ORAL | Status: DC | PRN

## 2022-02-23 MED ORDER — DOCUSATE SODIUM 100 MG PO CAPS
100.0000 mg | ORAL_CAPSULE | Freq: Two times a day (BID) | ORAL | Status: DC
  Administered 2022-02-24: 100 mg via ORAL
  Filled 2022-02-23: qty 1

## 2022-02-23 MED ORDER — PROMETHAZINE HCL 25 MG/ML IJ SOLN: 6.2500 mg | INTRAMUSCULAR | Status: DC | PRN

## 2022-02-23 MED ORDER — BUPIVACAINE HCL 0.5 % IJ SOLN
INTRAMUSCULAR | Status: DC | PRN
  Administered 2022-02-23: 1.8 mL

## 2022-02-23 MED ORDER — BUPIVACAINE-EPINEPHRINE (PF) 0.25% -1:200000 IJ SOLN
INTRAMUSCULAR | Status: DC | PRN
  Administered 2022-02-23: 30 mL

## 2022-02-23 MED ORDER — ONDANSETRON HCL 4 MG/2ML IJ SOLN: 4.0000 mg | Freq: Four times a day (QID) | INTRAMUSCULAR | Status: DC | PRN

## 2022-02-23 MED ORDER — GABAPENTIN 300 MG PO CAPS
900.0000 mg | ORAL_CAPSULE | Freq: Three times a day (TID) | ORAL | Status: DC
  Administered 2022-02-23 – 2022-02-24 (×3): 900 mg via ORAL
  Filled 2022-02-23 (×3): qty 3

## 2022-02-23 MED ORDER — MORPHINE SULFATE (PF) 2 MG/ML IV SOLN
1.0000 mg | INTRAVENOUS | Status: DC | PRN
  Administered 2022-02-23: 2 mg via INTRAVENOUS
  Filled 2022-02-23: qty 1

## 2022-02-23 MED ORDER — ORAL CARE MOUTH RINSE: 15.0000 mL | Freq: Once | OROMUCOSAL | Status: AC

## 2022-02-23 MED ORDER — INSULIN ASPART 100 UNIT/ML IJ SOLN
0.0000 [IU] | Freq: Three times a day (TID) | INTRAMUSCULAR | Status: DC
  Administered 2022-02-23 – 2022-02-24 (×3): 3 [IU] via SUBCUTANEOUS

## 2022-02-23 MED ORDER — MENTHOL 3 MG MT LOZG: 1.0000 | LOZENGE | OROMUCOSAL | Status: DC | PRN

## 2022-02-23 MED ORDER — KETOROLAC TROMETHAMINE 30 MG/ML IJ SOLN
INTRAMUSCULAR | Status: AC
  Filled 2022-02-23: qty 1

## 2022-02-23 MED ORDER — ROPIVACAINE HCL 5 MG/ML IJ SOLN
INTRAMUSCULAR | Status: DC | PRN
  Administered 2022-02-23: 20 mL via PERINEURAL

## 2022-02-23 MED ORDER — METFORMIN HCL ER 500 MG PO TB24
1000.0000 mg | ORAL_TABLET | Freq: Two times a day (BID) | ORAL | Status: DC
  Administered 2022-02-24: 1000 mg via ORAL
  Filled 2022-02-23: qty 2

## 2022-02-23 SURGICAL SUPPLY — 47 items
ATTUNE MED ANAT PAT 35 KNEE (Knees) IMPLANT
ATTUNE PSFEM LTSZ6 NARCEM KNEE (Femur) IMPLANT
ATTUNE PSRP INSR SZ6 7 KNEE (Insert) IMPLANT
BAG COUNTER SPONGE SURGICOUNT (BAG) IMPLANT
BASE TIBIA ATTUNE KNEE SYS SZ6 (Knees) IMPLANT
BLADE SAW SGTL 11.0X1.19X90.0M (BLADE) IMPLANT
BLADE SAW SGTL 13.0X1.19X90.0M (BLADE) ×1 IMPLANT
BNDG ELASTIC 6X5.8 VLCR STR LF (GAUZE/BANDAGES/DRESSINGS) ×1 IMPLANT
BOWL SMART MIX CTS (DISPOSABLE) ×1 IMPLANT
CEMENT HV SMART SET (Cement) IMPLANT
CUFF TOURN SGL QUICK 34 (TOURNIQUET CUFF) ×1
CUFF TRNQT CYL 34X4.125X (TOURNIQUET CUFF) ×1 IMPLANT
DERMABOND ADVANCED .7 DNX12 (GAUZE/BANDAGES/DRESSINGS) ×1 IMPLANT
DRAPE U-SHAPE 47X51 STRL (DRAPES) ×1 IMPLANT
DRESSING AQUACEL AG SP 3.5X10 (GAUZE/BANDAGES/DRESSINGS) ×1 IMPLANT
DRSG AQUACEL AG SP 3.5X10 (GAUZE/BANDAGES/DRESSINGS) ×1
DURAPREP 26ML APPLICATOR (WOUND CARE) ×2 IMPLANT
ELECT REM PT RETURN 15FT ADLT (MISCELLANEOUS) ×1 IMPLANT
GLOVE BIO SURGEON STRL SZ 6 (GLOVE) ×1 IMPLANT
GLOVE BIOGEL PI IND STRL 6.5 (GLOVE) ×1 IMPLANT
GLOVE BIOGEL PI IND STRL 7.5 (GLOVE) ×1 IMPLANT
GLOVE ORTHO TXT STRL SZ7.5 (GLOVE) ×2 IMPLANT
GOWN STRL REUS W/ TWL LRG LVL3 (GOWN DISPOSABLE) ×2 IMPLANT
GOWN STRL REUS W/TWL LRG LVL3 (GOWN DISPOSABLE) ×2
HANDPIECE INTERPULSE COAX TIP (DISPOSABLE) ×1
HOLDER FOLEY CATH W/STRAP (MISCELLANEOUS) IMPLANT
KIT TURNOVER KIT A (KITS) IMPLANT
MANIFOLD NEPTUNE II (INSTRUMENTS) ×1 IMPLANT
NS IRRIG 1000ML POUR BTL (IV SOLUTION) ×1 IMPLANT
PACK TOTAL KNEE CUSTOM (KITS) ×1 IMPLANT
PADDING CAST COTTON 6X4 STRL (CAST SUPPLIES) IMPLANT
PIN FIX SIGMA LCS THRD HI (PIN) IMPLANT
PROTECTOR NERVE ULNAR (MISCELLANEOUS) ×1 IMPLANT
SET HNDPC FAN SPRY TIP SCT (DISPOSABLE) ×1 IMPLANT
SET PAD KNEE POSITIONER (MISCELLANEOUS) ×1 IMPLANT
SUT MNCRL AB 4-0 PS2 18 (SUTURE) ×1 IMPLANT
SUT STRATAFIX PDS+ 0 24IN (SUTURE) ×1 IMPLANT
SUT VIC AB 1 CT1 36 (SUTURE) ×1 IMPLANT
SUT VIC AB 2-0 CT1 27 (SUTURE) ×2
SUT VIC AB 2-0 CT1 TAPERPNT 27 (SUTURE) ×2 IMPLANT
SYR 3ML LL SCALE MARK (SYRINGE) ×1 IMPLANT
TIBIA ATTUNE KNEE SYS BASE SZ6 (Knees) ×1 IMPLANT
TOWEL GREEN STERILE FF (TOWEL DISPOSABLE) ×1 IMPLANT
TRAY FOLEY MTR SLVR 14FR STAT (SET/KITS/TRAYS/PACK) IMPLANT
TUBE SUCTION HIGH CAP CLEAR NV (SUCTIONS) ×1 IMPLANT
WATER STERILE IRR 1000ML POUR (IV SOLUTION) ×2 IMPLANT
WRAP KNEE MAXI GEL POST OP (GAUZE/BANDAGES/DRESSINGS) ×1 IMPLANT

## 2022-02-23 NOTE — Evaluation (Signed)
Physical Therapy Evaluation Patient Details Name: Krystal Campbell MRN: [id number] DOB: 02/08/1961 Today's Date: 02/23/2022  History of Present Illness  Pt s/p L TKR and with hx of R TKR, CKD, DM, and colon CA  Clinical Impression  Pt s/p L TKR and presents with decreased L LE strength/ROM and post op pain limiting functional mobility.  Pt very motivated and should progress to dc home with assist of family.  Pt reports first OP PT scheduled for 02/27/22.     Recommendations for follow up therapy are one component of a multi-disciplinary discharge planning process, led by the attending physician.  Recommendations may be updated based on patient status, additional functional criteria and insurance authorization.  Follow Up Recommendations Follow physician's recommendations for discharge plan and follow up therapies      Assistance Recommended at Discharge Intermittent Supervision/Assistance  Patient can return home with the following  A little help with walking and/or transfers;A little help with bathing/dressing/bathroom;Assistance with cooking/housework;Assist for transportation;Help with stairs or ramp for entrance    Equipment Recommendations None recommended by PT  Recommendations for Other Services       Functional Status Assessment Patient has had a recent decline in their functional status and demonstrates the ability to make significant improvements in function in a reasonable and predictable amount of time.     Precautions / Restrictions Precautions Precautions: Knee;Fall Restrictions Weight Bearing Restrictions: No Other Position/Activity Restrictions: WBAT      Mobility  Bed Mobility Overal bed mobility: Needs Assistance Bed Mobility: Supine to Sit     Supine to sit: Min guard     General bed mobility comments: for safety    Transfers Overall transfer level: Needs assistance Equipment used: Rolling walker (2 wheels) Transfers: Sit to/from Stand Sit to Stand:  Min guard           General transfer comment: Steady assist with cues for LE management and use of UEs to self assist    Ambulation/Gait Ambulation/Gait assistance: Min assist, Min guard Gait Distance (Feet): 75 Feet Assistive device: Rolling walker (2 wheels) Gait Pattern/deviations: Step-to pattern, Decreased step length - right, Decreased step length - left, Shuffle, Trunk flexed Gait velocity: decr     General Gait Details: cues for sequence, posture, position from RW and stride length  Stairs            Wheelchair Mobility    Modified Rankin (Stroke Patients Only)       Balance Overall balance assessment: Mild deficits observed, not formally tested                                           Pertinent Vitals/Pain Pain Assessment Pain Assessment: 0-10 Pain Score: 7  Pain Location: R knee Pain Descriptors / Indicators: Aching, Sore Pain Intervention(s): Limited activity within patient's tolerance, Monitored during session, Premedicated before session, Patient requesting pain meds-RN notified, Ice applied    Home Living Family/patient expects to be discharged to:: Private residence Living Arrangements: Children Available Help at Discharge: Family;Available 24 hours/day Type of Home: Mobile home Home Access: Ramped entrance       Home Layout: One level Home Equipment: Conservation officer, nature (2 wheels);Rollator (4 wheels);Cane - quad;BSC/3in1      Prior Function Prior Level of Function : Independent/Modified Independent  Hand Dominance        Extremity/Trunk Assessment   Upper Extremity Assessment Upper Extremity Assessment: Overall WFL for tasks assessed    Lower Extremity Assessment Lower Extremity Assessment: RLE deficits/detail RLE Deficits / Details: IND SLR    Cervical / Trunk Assessment Cervical / Trunk Assessment: Normal  Communication   Communication: No difficulties  Cognition  Arousal/Alertness: Awake/alert Behavior During Therapy: WFL for tasks assessed/performed Overall Cognitive Status: Within Functional Limits for tasks assessed                                          General Comments      Exercises Total Joint Exercises Ankle Circles/Pumps: AROM, Both, 15 reps, Supine   Assessment/Plan    PT Assessment Patient needs continued PT services  PT Problem List Decreased strength;Decreased range of motion;Decreased activity tolerance;Decreased balance;Decreased mobility;Decreased knowledge of use of DME;Obesity;Pain       PT Treatment Interventions DME instruction;Gait training;Therapeutic activities;Functional mobility training;Therapeutic exercise;Patient/family education    PT Goals (Current goals can be found in the Care Plan section)  Acute Rehab PT Goals Patient Stated Goal: REgain IND PT Goal Formulation: With patient Time For Goal Achievement: 03/02/22 Potential to Achieve Goals: Good    Frequency 7X/week     Co-evaluation               AM-PAC PT "6 Clicks" Mobility  Outcome Measure Help needed turning from your back to your side while in a flat bed without using bedrails?: A Little Help needed moving from lying on your back to sitting on the side of a flat bed without using bedrails?: A Little Help needed moving to and from a bed to a chair (including a wheelchair)?: A Little Help needed standing up from a chair using your arms (e.g., wheelchair or bedside chair)?: A Little Help needed to walk in hospital room?: A Little Help needed climbing 3-5 steps with a railing? : A Little 6 Click Score: 18    End of Session Equipment Utilized During Treatment: Gait belt Activity Tolerance: Patient tolerated treatment well Patient left: in chair;with call bell/phone within reach;with chair alarm set;with nursing/sitter in room Nurse Communication: Mobility status PT Visit Diagnosis: Difficulty in walking, not elsewhere  classified (R26.2)    Time: 5631-4970 PT Time Calculation (min) (ACUTE ONLY): 25 min   Charges:   PT Evaluation $PT Eval Low Complexity: 1 Low PT Treatments $Gait Training: 8-22 mins        [place] Pager [phone] Office [phone]   Dariane Natzke 02/23/2022, 2:55 PM

## 2022-02-23 NOTE — Op Note (Signed)
NAME:  Krystal Campbell                      MEDICAL RECORD NO.:  [id number]                             FACILITY:  [facility]      PHYSICIAN:  Pietro Cassis. Alvan Dame, M.D.  DATE OF BIRTH:  08-Jun-1960      DATE OF PROCEDURE:  02/23/2022                                     OPERATIVE REPORT         PREOPERATIVE DIAGNOSIS:  Left knee osteoarthritis.      POSTOPERATIVE DIAGNOSIS:  Left knee osteoarthritis.      FINDINGS:  The patient was noted to have complete loss of cartilage and   bone-on-bone arthritis with associated osteophytes in the lateral and patellofemoral compartments of   the knee with a significant synovitis and associated effusion.  The patient had failed months of conservative treatment including medications, injection therapy, activity modification.     PROCEDURE:  Left total knee replacement.      COMPONENTS USED:  DePuy Attune rotating platform posterior stabilized knee   system, a size 6N femur, 6 tibia, size 7 mm PS AOX insert, and 35 anatomic patellar   button.      SURGEON:  Pietro Cassis. Alvan Dame, M.D.      ASSISTANT:  Costella Hatcher, PA-C.      ANESTHESIA:  General and Spinal.      SPECIMENS:  None.      COMPLICATION:  None.      DRAINS:  None.  EBL: <100 cc      TOURNIQUET TIME:   Total Tourniquet Time Documented: Thigh (Left) - 29 minutes Total: Thigh (Left) - 29 minutes  .      The patient was stable to the recovery room.      INDICATION FOR PROCEDURE:  Krystal Campbell is a 61 y.o. female patient of   mine.  The patient had been seen, evaluated, and treated for months conservatively in the   office with medication, activity modification, and injections.  The patient had   radiographic changes of bone-on-bone arthritis with endplate sclerosis and osteophytes noted.  Based on the radiographic changes and failed conservative measures, the patient   decided to proceed with definitive treatment, total knee replacement.  Risks of infection, DVT, component failure, need  for revision surgery, neurovascular injury were reviewed in the office setting.  The postop course was reviewed stressing the efforts to maximize post-operative satisfaction and function.  Consent was obtained for benefit of pain   relief.      PROCEDURE IN DETAIL:  The patient was brought to the operative theater.   Once adequate anesthesia, preoperative antibiotics, 2 gm of Ancef,1 gm of Tranexamic Acid, and 10 mg of Decadron administered, the patient was positioned supine with a left thigh tourniquet placed.  The  left lower extremity was prepped and draped in sterile fashion.  A time-   out was performed identifying the patient, planned procedure, and the appropriate extremity.      The left lower extremity was placed in the [facility] leg holder.  The leg was   exsanguinated, tourniquet elevated to 225 mmHg.  A midline incision was  made followed by median parapatellar arthrotomy.  Following initial   exposure, attention was first directed to the patella.  Precut   measurement was noted to be 21 mm.  I resected down to 13 mm and used a   35 anatomic patellar button to restore patellar height as well as cover the cut surface.      The lug holes were drilled and a metal shim was placed to protect the   patella from retractors and saw blade during the procedure.      At this point, attention was now directed to the femur.  The femoral   canal was opened with a drill, irrigated to try to prevent fat emboli.  An   intramedullary rod was passed at 3 degrees valgus, 11 mm of bone was   resected off the distal femur due to preoperative flexion contracture.  Following this resection, the tibia was   subluxated anteriorly.  Using the extramedullary guide, 3 mm of bone was resected off   the proximal lateral tibia.  We confirmed the gap would be   stable medially and laterally with a size 5 spacer block as well as confirmed that the tibial cut was perpendicular in the coronal plane, checking with an  alignment rod.      Once this was done, I sized the femur to be a size 6 in the anterior-   posterior dimension, chose a narrow component based on medial and   lateral dimension.  The size 6 rotation block was then pinned in   position anterior referenced using the C-clamp to set rotation.  The   anterior, posterior, and  chamfer cuts were made without difficulty nor   notching making certain that I was along the anterior cortex to help   with flexion gap stability.      The final box cut was made off the lateral aspect of distal femur.      At this point, the tibia was sized to be a size 6.  The size 6 tray was   then pinned in position through the medial third of the tubercle,   drilled, and keel punched.  Trial reduction was now carried with a 6 femur,  6 tibia, a size 7 mm PS insert, and the 35 anatomic patella botton.  The knee was brought to full extension with good flexion stability with the patella   tracking through the trochlea without application of pressure.  Given   all these findings the trial components removed.  Final components were   opened and cement was mixed.  The knee was irrigated with normal saline solution and pulse lavage.  The synovial lining was   then injected with 30 cc of 0.25% Marcaine with epinephrine, 1 cc of Toradol and 30 cc of NS for a total of 61 cc.     Final implants were then cemented onto cleaned and dried cut surfaces of bone with the knee brought to extension with a 7 mm PS trial insert.      Once the cement had fully cured, excess cement was removed   throughout the knee.  I confirmed that I was satisfied with the range of   motion and stability, and the final 7 mm PS AOX insert was chosen.  It was   placed into the knee.      The tourniquet had been let down at 29 minutes.  No significant   hemostasis was required.  The extensor mechanism was then reapproximated  using #1 Vicryl and #1 Stratafix sutures with the knee   in flexion.  The    remaining wound was closed with 2-0 Vicryl and running 4-0 Monocryl.   The knee was cleaned, dried, dressed sterilely using Dermabond and   Aquacel dressing.  The patient was then   brought to recovery room in stable condition, tolerating the procedure   well.   Please note that Physician Assistant, Costella Hatcher, PA-C was present for the entirety of the case, and was utilized for pre-operative positioning, peri-operative retractor management, general facilitation of the procedure and for primary wound closure at the end of the case.              Pietro Cassis Alvan Dame, M.D.    02/23/2022 8:33 AM

## 2022-02-23 NOTE — Interval H&P Note (Signed)
History and Physical Interval Note:  02/23/2022 7:11 AM  Krystal Campbell  has presented today for surgery, with the diagnosis of Left knee osteoarthritis.  The various methods of treatment have been discussed with the patient and family. After consideration of risks, benefits and other options for treatment, the patient has consented to  Procedure(s): TOTAL KNEE ARTHROPLASTY (Left) as a surgical intervention.  The patient's history has been reviewed, patient examined, no change in status, stable for surgery.  I have reviewed the patient's chart and labs.  Questions were answered to the patient's satisfaction.     Mauri Pole

## 2022-02-23 NOTE — Anesthesia Postprocedure Evaluation (Signed)
Anesthesia Post Note  Patient: Krystal Campbell  Procedure(s) Performed: TOTAL KNEE ARTHROPLASTY (Left: Knee)     Patient location during evaluation: PACU Anesthesia Type: Regional and Spinal Level of consciousness: awake and alert Pain management: pain level controlled Vital Signs Assessment: post-procedure vital signs reviewed and stable Respiratory status: spontaneous breathing, nonlabored ventilation and respiratory function stable Cardiovascular status: blood pressure returned to baseline and stable Postop Assessment: no apparent nausea or vomiting Anesthetic complications: no   No notable events documented.  Last Vitals:  Vitals:   02/23/22 1000 02/23/22 1015  BP: 104/61 102/61  Pulse: 84 84  Resp: 14 14  Temp:    SpO2: 92% 93%    Last Pain:  Vitals:   02/23/22 1015  TempSrc:   PainSc: 0-No pain                 Lynda Rainwater

## 2022-02-23 NOTE — Discharge Instructions (Addendum)
INSTRUCTIONS AFTER JOINT REPLACEMENT   You are taking several medications which may cause sedation including gabapentin and amitriptyline. Pain medication will also contribute to this. Minimize your use of pain medications as able. I have sent in narcan which is a nasal spray that is used to reverse the effects of pain medication should you become oversedated.  Remove items at home which could result in a fall. This includes throw rugs or furniture in walking pathways ICE to the affected joint every three hours while awake for 30 minutes at a time, for at least the first 3-5 days, and then as needed for pain and swelling.  Continue to use ice for pain and swelling. You may notice swelling that will progress down to the foot and ankle.  This is normal after surgery.  Elevate your leg when you are not up walking on it.   Continue to use the breathing machine you got in the hospital (incentive spirometer) which will help keep your temperature down.  It is common for your temperature to cycle up and down following surgery, especially at night when you are not up moving around and exerting yourself.  The breathing machine keeps your lungs expanded and your temperature down.   DIET:  As you were doing prior to hospitalization, we recommend a well-balanced diet.  DRESSING / WOUND CARE / SHOWERING  Keep the surgical dressing until follow up.  The dressing is water proof, so you can shower without any extra covering.  IF THE DRESSING FALLS OFF or the wound gets wet inside, change the dressing with sterile gauze.  Please use good hand washing techniques before changing the dressing.  Do not use any lotions or creams on the incision until instructed by your surgeon.    ACTIVITY  Increase activity slowly as tolerated, but follow the weight bearing instructions below.   No driving for 6 weeks or until further direction given by your physician.  You cannot drive while taking narcotics.  No lifting or carrying  greater than 10 lbs. until further directed by your surgeon. Avoid periods of inactivity such as sitting longer than an hour when not asleep. This helps prevent blood clots.  You may return to work once you are authorized by your doctor.     WEIGHT BEARING   Weight bearing as tolerated with assist device (walker, cane, etc) as directed, use it as long as suggested by your surgeon or therapist, typically at least 4-6 weeks.   EXERCISES  Results after joint replacement surgery are often greatly improved when you follow the exercise, range of motion and muscle strengthening exercises prescribed by your doctor. Safety measures are also important to protect the joint from further injury. Any time any of these exercises cause you to have increased pain or swelling, decrease what you are doing until you are comfortable again and then slowly increase them. If you have problems or questions, call your caregiver or physical therapist for advice.   Rehabilitation is important following a joint replacement. After just a few days of immobilization, the muscles of the leg can become weakened and shrink (atrophy).  These exercises are designed to build up the tone and strength of the thigh and leg muscles and to improve motion. Often times heat used for twenty to thirty minutes before working out will loosen up your tissues and help with improving the range of motion but do not use heat for the first two weeks following surgery (sometimes heat can increase post-operative swelling).  These exercises can be done on a training (exercise) mat, on the floor, on a table or on a bed. Use whatever works the best and is most comfortable for you.    Use music or television while you are exercising so that the exercises are a pleasant break in your day. This will make your life better with the exercises acting as a break in your routine that you can look forward to.   Perform all exercises about fifteen times, three times  per day or as directed.  You should exercise both the operative leg and the other leg as well.  Exercises include:   Quad Sets - Tighten up the muscle on the front of the thigh (Quad) and hold for 5-10 seconds.   Straight Leg Raises - With your knee straight (if you were given a brace, keep it on), lift the leg to 60 degrees, hold for 3 seconds, and slowly lower the leg.  Perform this exercise against resistance later as your leg gets stronger.  Leg Slides: Lying on your back, slowly slide your foot toward your buttocks, bending your knee up off the floor (only go as far as is comfortable). Then slowly slide your foot back down until your leg is flat on the floor again.  Angel Wings: Lying on your back spread your legs to the side as far apart as you can without causing discomfort.  Hamstring Strength:  Lying on your back, push your heel against the floor with your leg straight by tightening up the muscles of your buttocks.  Repeat, but this time bend your knee to a comfortable angle, and push your heel against the floor.  You may put a pillow under the heel to make it more comfortable if necessary.   A rehabilitation program following joint replacement surgery can speed recovery and prevent re-injury in the future due to weakened muscles. Contact your doctor or a physical therapist for more information on knee rehabilitation.    CONSTIPATION  Constipation is defined medically as fewer than three stools per week and severe constipation as less than one stool per week.  Even if you have a regular bowel pattern at home, your normal regimen is likely to be disrupted due to multiple reasons following surgery.  Combination of anesthesia, postoperative narcotics, change in appetite and fluid intake all can affect your bowels.   YOU MUST use at least one of the following options; they are listed in order of increasing strength to get the job done.  They are all available over the counter, and you may need  to use some, POSSIBLY even all of these options:    Drink plenty of fluids (prune juice may be helpful) and high fiber foods Colace 100 mg by mouth twice a day  Senokot for constipation as directed and as needed Dulcolax (bisacodyl), take with full glass of water  Miralax (polyethylene glycol) once or twice a day as needed.  If you have tried all these things and are unable to have a bowel movement in the first 3-4 days after surgery call either your surgeon or your primary doctor.    If you experience loose stools or diarrhea, hold the medications until you stool forms back up.  If your symptoms do not get better within 1 week or if they get worse, check with your doctor.  If you experience "the worst abdominal pain ever" or develop nausea or vomiting, please contact the office immediately for further recommendations for treatment.  ITCHING:  If you experience itching with your medications, try taking only a single pain pill, or even half a pain pill at a time.  You can also use Benadryl over the counter for itching or also to help with sleep.   TED HOSE STOCKINGS:  Use stockings on both legs until for at least 2 weeks or as directed by physician office. They may be removed at night for sleeping.  MEDICATIONS:  See your medication summary on the "After Visit Summary" that nursing will review with you.  You may have some home medications which will be placed on hold until you complete the course of blood thinner medication.  It is important for you to complete the blood thinner medication as prescribed.  PRECAUTIONS:  If you experience chest pain or shortness of breath - call 911 immediately for transfer to the hospital emergency department.   If you develop a fever greater that 101 F, purulent drainage from wound, increased redness or drainage from wound, foul odor from the wound/dressing, or calf pain - CONTACT YOUR SURGEON.                                                   FOLLOW-UP  APPOINTMENTS:  If you do not already have a post-op appointment, please call the office for an appointment to be seen by your surgeon.  Guidelines for how soon to be seen are listed in your "After Visit Summary", but are typically between 1-4 weeks after surgery.  OTHER INSTRUCTIONS:   Knee Replacement:  Do not place pillow under knee, focus on keeping the knee straight while resting. CPM instructions: 0-90 degrees, 2 hours in the morning, 2 hours in the afternoon, and 2 hours in the evening. Place foam block, curve side up under heel at all times except when in CPM or when walking.  DO NOT modify, tear, cut, or change the foam block in any way.  POST-OPERATIVE OPIOID TAPER INSTRUCTIONS: It is important to wean off of your opioid medication as soon as possible. If you do not need pain medication after your surgery it is ok to stop day one. Opioids include: Codeine, Hydrocodone(Norco, Vicodin), Oxycodone(Percocet, oxycontin) and hydromorphone amongst others.  Long term and even short term use of opiods can cause: Increased pain response Dependence Constipation Depression Respiratory depression And more.  Withdrawal symptoms can include Flu like symptoms Nausea, vomiting And more Techniques to manage these symptoms Hydrate well Eat regular healthy meals Stay active Use relaxation techniques(deep breathing, meditating, yoga) Do Not substitute Alcohol to help with tapering If you have been on opioids for less than two weeks and do not have pain than it is ok to stop all together.  Plan to wean off of opioids This plan should start within one week post op of your joint replacement. Maintain the same interval or time between taking each dose and first decrease the dose.  Cut the total daily intake of opioids by one tablet each day Next start to increase the time between doses. The last dose that should be eliminated is the evening dose.   MAKE SURE YOU:  Understand these instructions.   Get help right away if you are not doing well or get worse.    Thank you for letting us be a part of your medical care team.  It is a privilege  we respect greatly.  We hope these instructions will help you stay on track for a fast and full recovery!     Information on my medicine - XARELTO (Rivaroxaban) Why was Xarelto prescribed for you? Xarelto was prescribed for you to reduce the risk of blood clots forming after orthopedic surgery. The medical term for these abnormal blood clots is venous thromboembolism (VTE).  What do you need to know about xarelto ? Take your Xarelto ONCE DAILY at the same time every day. You may take it either with or without food.  If you have difficulty swallowing the tablet whole, you may crush it and mix in applesauce just prior to taking your dose.  Take Xarelto exactly as prescribed by your doctor and DO NOT stop taking Xarelto without talking to the doctor who prescribed the medication.  Stopping without other VTE prevention medication to take the place of Xarelto may increase your risk of developing a clot.  After discharge, you should have regular check-up appointments with your healthcare provider that is prescribing your Xarelto.    What do you do if you miss a dose? If you miss a dose, take it as soon as you remember on the same day then continue your regularly scheduled once daily regimen the next day. Do not take two doses of Xarelto on the same day.   Important Safety Information A possible side effect of Xarelto is bleeding. You should call your healthcare provider right away if you experience any of the following: Bleeding from an injury or your nose that does not stop. Unusual colored urine (red or dark brown) or unusual colored stools (red or black). Unusual bruising for unknown reasons. A serious fall or if you hit your head (even if there is no bleeding).  Some medicines may interact with Xarelto and might increase your risk  of bleeding while on Xarelto. To help avoid this, consult your healthcare provider or pharmacist prior to using any new prescription or non-prescription medications, including herbals, vitamins, non-steroidal anti-inflammatory drugs (NSAIDs) and supplements.  This website has more information on Xarelto: [email or web address]

## 2022-02-23 NOTE — Plan of Care (Signed)
  Problem: Nutrition: Goal: Adequate nutrition will be maintained Outcome: Progressing   Problem: Pain Managment: Goal: General experience of comfort will improve Outcome: Progressing   

## 2022-02-23 NOTE — Transfer of Care (Signed)
Immediate Anesthesia Transfer of Care Note  Patient: Krystal Campbell  Procedure(s) Performed: TOTAL KNEE ARTHROPLASTY (Left: Knee)  Patient Location: PACU  Anesthesia Type:MAC, Regional, and Spinal  Level of Consciousness: awake, alert , and oriented  Airway & Oxygen Therapy: Patient Spontanous Breathing  Post-op Assessment: Report given to RN and Post -op Vital signs reviewed and stable  Post vital signs: Reviewed and stable  Last Vitals:  Vitals Value Taken Time  BP    Temp    Pulse    Resp    SpO2      Last Pain:  Vitals:   02/23/22 0556  TempSrc: Oral  PainSc: 0-No pain         Complications: No notable events documented.

## 2022-02-23 NOTE — H&P (Signed)
TOTAL KNEE ADMISSION H&P  Patient is being admitted for left total knee arthroplasty.  Subjective:  Chief Complaint:left knee pain.  HPI: Krystal Campbell, 61 y.o. female, has a history of pain and functional disability in the left knee due to arthritis and has failed non-surgical conservative treatments for greater than 12 weeks to includeNSAID's and/or analgesics, corticosteriod injections, and activity modification.  Onset of symptoms was gradual, starting 2 years ago with gradually worsening course since that time. The patient noted prior procedures on the knee to include  arthroscopy and menisectomy on the left knee(s).  Patient currently rates pain in the left knee(s) at 8 out of 10 with activity. Patient has worsening of pain with activity and weight bearing and pain that interferes with activities of daily living.  Patient has evidence of joint space narrowing by imaging studies. There is no active infection.  Patient Active Problem List   Diagnosis Date Noted   ASCUS of cervix with negative high risk HPV 01/28/2021   Papanicolaou smear of cervix with positive high risk human papilloma virus (HPV) test 01/21/2021   Postmenopause 01/21/2021   Urge incontinence of urine 01/20/2020   Encounter for screening fecal occult blood testing 01/20/2020   Encounter for gynecological examination with Papanicolaou smear of cervix 01/20/2020   Well woman exam with routine gynecological exam 01/20/2020   Diabetes mellitus type 2 in obese [facility])    Stage 3 chronic kidney disease ([place])    Malignant neoplasm of colon ([place])    History of pulmonary embolism    On total parenteral nutrition (TPN)    Leukocytosis    Acute blood loss anemia    Tachypnea    Reactive hypertension    Post-operative pain    Encounter for central line placement    Encounter for orogastric (OG) tube placement    Acute respiratory failure ([place])    Abdominal wall seroma 11/23/2016   S/P hernia surgery 01/17/2016    Adenocarcinoma of colon ([place]) 11/10/2014   Obesity 11/10/2014   Acute pulmonary embolism ([place]) 10/26/2014   Iron deficiency anemia 10/26/2014   Cecal lesion 10/25/2014   Anxiety state 10/25/2014   Absolute anemia    GERD (gastroesophageal reflux disease) 10/10/2014   Microcytic anemia 10/09/2014   Diabetes ([place]) 10/09/2014   Hypertension 10/09/2014   Past Medical History:  Diagnosis Date   Anemia    Anxiety    Arthritis    in knees   ASCUS of cervix with negative high risk HPV 01/28/2021   01/2021 repeat pap in 1 year per ASCCP guidelines    Cancer [facility])    colon - 2016   Carpal tunnel syndrome of right wrist    Chronic kidney disease    has 1/3 of right kidney - urethra was crushed (back in 1970)   Chronic pain syndrome    Colon cancer ([place])    Depression    Diabetes mellitus without complication ([place])    dx back in 2000   Diverticulosis    GERD (gastroesophageal reflux disease)    History of blood transfusion    10/09/14   History of hiatal hernia    Hyperlipidemia    Incisional hernia    Pneumonia    Pulmonary embolism ([place])    same time they found cancer in 2016  2 blood clots, one in each lung   Vaginal Pap smear, abnormal    Wears glasses     Past Surgical History:  Procedure Laterality Date   APPENDECTOMY  APPLICATION OF WOUND VAC N/A 11/29/2016   Procedure: APPLICATION OF WOUND VAC;  Surgeon: Erroll Luna, MD;  Location: [place];  Service: General;  Laterality: N/A;   CHOLECYSTECTOMY     COLONOSCOPY WITH PROPOFOL Left 10/30/2014   Procedure: COLONOSCOPY WITH PROPOFOL;  Surgeon: Ronald Lobo, MD;  Location: [facility] ENDOSCOPY;  Service: Endoscopy;  Laterality: Left;   DILATION AND CURETTAGE OF UTERUS     ESOPHAGOGASTRODUODENOSCOPY (EGD) WITH PROPOFOL Left 10/30/2014   Procedure: ESOPHAGOGASTRODUODENOSCOPY (EGD) WITH PROPOFOL;  Surgeon: Ronald Lobo, MD;  Location: [facility] ENDOSCOPY;  Service: Endoscopy;  Laterality: Left;   EXCISION OF MESH N/A 11/23/2016    Procedure: REMOVAL OF MESH FROM ABDOMINAL WALL;  Surgeon: Erroll Luna, MD;  Location: [place];  Service: General;  Laterality: N/A;   INCISION AND DRAINAGE ABSCESS N/A 07/06/2016   Procedure: DRAINAGE ABDOMINAL WALL SEROMA;  Surgeon: Erroll Luna, MD;  Location: [place];  Service: General;  Laterality: N/A;   INCISION AND DRAINAGE ABSCESS N/A 08/09/2016   Procedure: DRAINAGE OF ABDOMINAL WALL SEROMA;  Surgeon: Erroll Luna, MD;  Location: [place];  Service: General;  Laterality: N/A;   [place] N/A 01/17/2016   Procedure: INCISIONAL HERNIA REPAIR;  Surgeon: Erroll Luna, MD;  Location: [place];  Service: General;  Laterality: N/A;   [place] N/A 11/23/2016   Procedure: HERNIA REPAIR INCISIONAL;  Surgeon: Erroll Luna, MD;  Location: [place];  Service: General;  Laterality: N/A;   INSERTION OF MESH N/A 01/17/2016   Procedure: INSERTION OF MESH;  Surgeon: Erroll Luna, MD;  Location: [place];  Service: General;  Laterality: N/A;   INSERTION OF MESH  11/29/2016   Procedure: PLACEMENT OF STRATTICE BIOLOGIC MESH;  Surgeon: Erroll Luna, MD;  Location: [place];  Service: General;;   IR GENERIC HISTORICAL  06/19/2016   IR SCLEROTHERAPY OF A FLUID COLLECTION 06/19/2016 Marybelle Killings, MD [facility]-INTERV RAD   IR RADIOLOGIST EVAL & MGMT  06/13/2016   IR RADIOLOGIST EVAL & MGMT  06/27/2016   IVC FILTER PLACEMENT ([place] HX)     IVC filter removed     JOINT REPLACEMENT  06/2013   Total right knee replacement; left knee scoped   KIDNEY SURGERY     when patient was a teenager   LAPAROSCOPIC PARTIAL COLECTOMY N/A 11/04/2014   Procedure: LAPAROSCOPIC PARTIAL COLECTOMY POSSIBLE OPEN;  Surgeon: Erroll Luna, MD;  Location: [place];  Service: General;  Laterality: N/A;   LAPAROTOMY N/A 11/26/2016   Procedure: EXPLORATORY LAPAROTOMY, CLOSURE OF LATERAL ABDOMINAL FASCIA  AND REOPENING OF MIDLINE FASCIA;   Surgeon: Kinsinger, Arta Bruce, MD;  Location: [place];  Service: General;  Laterality: N/A;   LAPAROTOMY N/A 11/29/2016   Procedure: EXPLORATORY LAPAROTOMY;  Surgeon: Erroll Luna, MD;  Location: [place];  Service: General;  Laterality: N/A;   LAPAROTOMY N/A 11/25/2016   Procedure: EXPLORATORY LAPAROTOMY AND PLACEMENT OF OPEN ABDOMEN VAC;  Surgeon: Georganna Skeans, MD;  Location: [place];  Service: General;  Laterality: N/A;   Left knee arthroscopy     PORT-A-CATH REMOVAL Right 03/07/2016   Procedure: REMOVAL PORT-A-CATH;  Surgeon: Erroll Luna, MD;  Location: [place];  Service: General;  Laterality: Right;   PORTACATH PLACEMENT Right 12/22/2014   Procedure: INSERTION PORT-A-CATH;  Surgeon: Erroll Luna, MD;  Location: [place];  Service: General;  Laterality: Right;   REPLACEMENT TOTAL KNEE     TUBAL LIGATION      Current Facility-Administered Medications  Medication Dose Route Frequency Provider Last Rate Last Admin   ceFAZolin (ANCEF) IVPB 2g/100 mL premix  2 g Intravenous On Call to OR Irving Copas, PA-C       dexamethasone (DECADRON) injection 8 mg  8 mg Intravenous Once Irving Copas, PA-C       lactated ringers infusion   Intravenous Continuous Irving Copas, PA-C       lactated ringers infusion   Intravenous Continuous Janeece Riggers, MD 10 mL/hr at 02/23/22 4782 New Bag at 02/23/22 9562   tranexamic acid (CYKLOKAPRON) IVPB 1,000 mg  1,000 mg Intravenous To OR Irving Copas, PA-C       Allergies  Allergen Reactions   Fentanyl Anxiety and Other (See Comments)    Severe agitation   Hydromorphone Other (See Comments)    Severe agitation    Social History   Tobacco Use   Smoking status: Former    Packs/day: 1.00    Years: 5.00    Total pack years: 5.00    Types: Cigarettes    Quit date: 11/20/1968    Years since quitting: 53.2   Smokeless tobacco: Never   Tobacco comments:    Quit smoking 30 years ago  Substance Use Topics   Alcohol use:  Not Currently    Comment: rarely    Family History  Problem Relation Age of Onset   Healthy Mother        no clotting issues in either parents   Healthy Father    Cancer Other    Obesity Son    Obesity Daughter      Review of Systems  Constitutional:  Negative for chills and fever.  Respiratory:  Negative for cough and shortness of breath.   Cardiovascular:  Negative for chest pain.  Gastrointestinal:  Negative for nausea and vomiting.  Musculoskeletal:  Positive for arthralgias.     Objective:  Physical Exam Well nourished and well developed. General: Alert and oriented x3, cooperative and pleasant, no acute distress. Head: normocephalic, atraumatic, neck supple. Eyes: EOMI.  Musculoskeletal: Left knee exam: No palpable effusion, warmth erythema Tenderness over the lateral and anterior aspect the knee Slight flexion contracture associated with neutral to slight valgus Intact medial and lateral collateral ligaments No significant lower extremity edema or erythema   Calves soft and nontender. Motor function intact in LE. Strength 5/5 LE bilaterally. Neuro: Distal pulses 2+. Sensation to light touch intact in LE.  Vital signs in last 24 hours: Temp:  [98 F (36.7 C)] 98 F (36.7 C) (11/30 0556) Pulse Rate:  [89] 89 (11/30 0556) Resp:  [16] 16 (11/30 0556) BP: (147)/(83) 147/83 (11/30 0556) SpO2:  [96 %] 96 % (11/30 0556) Weight:  [102 kg] 102 kg (11/30 0556)  Labs:   Estimated body mass index is 38.6 kg/m as calculated from the following:   Height as of this encounter: '5\' 4"'$  (1.626 m).   Weight as of this encounter: 102 kg.   Imaging Review Plain radiographs demonstrate severe degenerative joint disease of the left knee(s). The overall alignment isneutral. The bone quality appears to be adequate for age and reported activity level.      Assessment/Plan:  End stage arthritis, left knee   The patient history, physical examination, clinical judgment  of the provider and imaging studies are consistent with end stage degenerative joint disease of the left knee(s) and total knee arthroplasty is deemed medically necessary. The treatment options including medical management, injection therapy arthroscopy and arthroplasty were  discussed at length. The risks and benefits of total knee arthroplasty were presented and reviewed. The risks due to aseptic loosening, infection, stiffness, patella tracking problems, thromboembolic complications and other imponderables were discussed. The patient acknowledged the explanation, agreed to proceed with the plan and consent was signed. Patient is being admitted for inpatient treatment for surgery, pain control, PT, OT, prophylactic antibiotics, VTE prophylaxis, progressive ambulation and ADL's and discharge planning. The patient is planning to be discharged  home.   Therapy Plans: outpatient therapy at [facility] Disposition: Home with daughter (lives with her) Planned DVT Prophylaxis: Xarelto '10mg'$  daily DME needed: none PCP: Dr. Bartolo Darter - will email form to her nurse TXA: IV Allergies: dilaudid - multiple doses caused paranoia, fentanyl - same Anesthesia Concerns: none BMI: 40.6 Last HgbA1c: 6.0%   Other: - Hx of going into a coma after complicated hernia repair surgery - multiple surgeries - Hx of colon CA - 2016 - in remission/cancer free - Has a port, left chest - Hx of PE in 2016 - oxycodone, robaxin, tylenol (no NSAIDs)  Patient's anticipated LOS is less than 2 midnights, meeting these requirements: - Younger than 54 - Lives within 1 hour of care - Has a competent adult at home to recover with post-op recover - NO history of  - Chronic pain requiring opiods  - Diabetes  - Coronary Artery Disease  - Heart failure  - Heart attack  - Stroke  - DVT/VTE  - Cardiac arrhythmia  - Respiratory Failure/COPD  - Renal failure  - Anemia  - Advanced Liver disease  Costella Hatcher, PA-C Orthopedic  Surgery [facility] [place] [phone]

## 2022-02-23 NOTE — Anesthesia Preprocedure Evaluation (Signed)
Anesthesia Evaluation  Patient identified by MRN, date of birth, ID band Patient awake and Patient unresponsive    Reviewed: Allergy & Precautions, NPO status , Patient's Chart, lab work & pertinent test results  Airway Mallampati: Intubated  TM Distance: >3 FB Neck ROM: Full    Dental no notable dental hx.    Pulmonary neg pulmonary ROS, pneumonia, former smoker   Pulmonary exam normal breath sounds clear to auscultation       Cardiovascular hypertension, Pt. on medications negative cardio ROS Normal cardiovascular exam Rhythm:Regular Rate:Normal     Neuro/Psych   Anxiety Depression    negative neurological ROS  negative psych ROS   GI/Hepatic negative GI ROS, Neg liver ROS, hiatal hernia,GERD  ,,History noted.    Endo/Other  negative endocrine ROSdiabetes    Renal/GU Renal diseasenegative Renal ROS  negative genitourinary   Musculoskeletal negative musculoskeletal ROS (+) Arthritis ,    Abdominal   Peds negative pediatric ROS (+)  Hematology negative hematology ROS (+) Blood dyscrasia, anemia   Anesthesia Other Findings   Reproductive/Obstetrics negative OB ROS                             Anesthesia Physical Anesthesia Plan  ASA: 3  Anesthesia Plan: Spinal and Regional   Post-op Pain Management: Regional block* and Tylenol PO (pre-op)*   Induction: Intravenous  PONV Risk Score and Plan: 2 and Ondansetron, Treatment may vary due to age or medical condition and Midazolam  Airway Management Planned: Simple Face Mask  Additional Equipment:   Intra-op Plan:   Post-operative Plan:   Informed Consent: I have reviewed the patients History and Physical, chart, labs and discussed the procedure including the risks, benefits and alternatives for the proposed anesthesia with the patient or authorized representative who has indicated his/her understanding and acceptance.        Plan Discussed with: CRNA, Anesthesiologist and Surgeon  Anesthesia Plan Comments:         Anesthesia Quick Evaluation

## 2022-02-23 NOTE — Anesthesia Procedure Notes (Signed)
Anesthesia Regional Block: Adductor canal block   Pre-Anesthetic Checklist: , timeout performed,  Correct Patient, Correct Site, Correct Laterality,  Correct Procedure, Correct Position, site marked,  Risks and benefits discussed,  Surgical consent,  Pre-op evaluation,  At surgeon's request and post-op pain management  Laterality: Left  Prep: chloraprep       Needles:  Injection technique: Single-shot  Needle Type: Stimiplex     Needle Length: 9cm  Needle Gauge: 21     Additional Needles:   Procedures:,,,, ultrasound used (permanent image in chart),,    Narrative:  Start time: 02/23/2022 6:55 AM End time: 02/23/2022 7:00 AM Injection made incrementally with aspirations every 5 mL.  Performed by: Personally  Anesthesiologist: Lynda Rainwater, MD

## 2022-02-24 ENCOUNTER — Encounter (HOSPITAL_COMMUNITY): Payer: Self-pay | Admitting: Orthopedic Surgery

## 2022-02-24 DIAGNOSIS — M1712 Unilateral primary osteoarthritis, left knee: Secondary | ICD-10-CM | POA: Diagnosis not present

## 2022-02-24 LAB — CBC
HCT: 33 % — ABNORMAL LOW (ref 36.0–46.0)
Hemoglobin: 10.4 g/dL — ABNORMAL LOW (ref 12.0–15.0)
MCH: 31.2 pg (ref 26.0–34.0)
MCHC: 31.5 g/dL (ref 30.0–36.0)
MCV: 99.1 fL (ref 80.0–100.0)
Platelets: 226 10*3/uL (ref 150–400)
RBC: 3.33 MIL/uL — ABNORMAL LOW (ref 3.87–5.11)
RDW: 14.2 % (ref 11.5–15.5)
WBC: 8.6 10*3/uL (ref 4.0–10.5)
nRBC: 0 % (ref 0.0–0.2)

## 2022-02-24 LAB — GLUCOSE, CAPILLARY
Glucose-Capillary: 182 mg/dL — ABNORMAL HIGH (ref 70–99)
Glucose-Capillary: 119 mg/dL — ABNORMAL HIGH (ref 70–99)

## 2022-02-24 LAB — BASIC METABOLIC PANEL
Anion gap: 7 (ref 5–15)
BUN: 12 mg/dL (ref 8–23)
CO2: 25 mmol/L (ref 22–32)
Calcium: 8.8 mg/dL — ABNORMAL LOW (ref 8.9–10.3)
Chloride: 108 mmol/L (ref 98–111)
Creatinine, Ser: 1.05 mg/dL — ABNORMAL HIGH (ref 0.44–1.00)
GFR, Estimated: >60 mL/min (ref >60)
Glucose, Bld: 135 mg/dL — ABNORMAL HIGH (ref 70–99)
Potassium: 4.3 mmol/L (ref 3.5–5.1)
Sodium: 140 mmol/L (ref 135–145)

## 2022-02-24 MED ORDER — OXYCODONE HCL 5 MG PO TABS
5.0000 mg | ORAL_TABLET | ORAL | Status: DC | PRN
  Administered 2022-02-24 (×2): 10 mg via ORAL
  Filled 2022-02-24 (×2): qty 2

## 2022-02-24 MED ORDER — RIVAROXABAN 10 MG PO TABS: 10.0000 mg | ORAL_TABLET | Freq: Every day | ORAL | 0 refills | Status: DC

## 2022-02-24 MED ORDER — POLYETHYLENE GLYCOL 3350 17 G PO PACK: 17.0000 g | PACK | Freq: Two times a day (BID) | ORAL | 0 refills | Status: AC

## 2022-02-24 MED ORDER — METHOCARBAMOL 500 MG PO TABS: 500.0000 mg | ORAL_TABLET | Freq: Four times a day (QID) | ORAL | 2 refills | Status: AC | PRN

## 2022-02-24 MED ORDER — NALOXONE HCL 4 MG/0.1ML NA LIQD: NASAL | 0 refills | Status: AC

## 2022-02-24 MED ORDER — ACETAMINOPHEN 500 MG PO TABS: 1000.0000 mg | ORAL_TABLET | Freq: Four times a day (QID) | ORAL | 0 refills | Status: AC

## 2022-02-24 MED ORDER — OXYCODONE HCL 5 MG PO TABS: 5.0000 mg | ORAL_TABLET | ORAL | 0 refills | Status: DC | PRN

## 2022-02-24 NOTE — Progress Notes (Signed)
Physical Therapy Treatment Patient Details Name: Krystal Campbell MRN: [id number] DOB: [birth year] Today's Date: 02/24/2022   History of Present Illness Pt s/p L TKR and with hx of R TKR, CKD, DM, and colon CA    PT Comments    Pt very motivated and progressing well with mobility including up to ambulate increased distance in hall and performed HEP with written instruction provided.  However, with ambulation pt experienced a "pop" on the lateral aspect of the L knee with an increase in pain - pt returned to sitting and RN aware as this occurred at the nurse's station.  Pt returned to room and ice packs applied.   Recommendations for follow up therapy are one component of a multi-disciplinary discharge planning process, led by the attending physician.  Recommendations may be updated based on patient status, additional functional criteria and insurance authorization.  Follow Up Recommendations  Follow physician's recommendations for discharge plan and follow up therapies     Assistance Recommended at Discharge Intermittent Supervision/Assistance  Patient can return home with the following A little help with walking and/or transfers;A little help with bathing/dressing/bathroom;Assistance with cooking/housework;Assist for transportation;Help with stairs or ramp for entrance   Equipment Recommendations  None recommended by PT    Recommendations for Other Services       Precautions / Restrictions Precautions Precautions: Knee;Fall Restrictions Weight Bearing Restrictions: No Other Position/Activity Restrictions: WBAT     Mobility  Bed Mobility Overal bed mobility: Needs Assistance Bed Mobility: Supine to Sit     Supine to sit: Supervision     General bed mobility comments: for safety    Transfers Overall transfer level: Needs assistance Equipment used: Rolling walker (2 wheels) Transfers: Sit to/from Stand Sit to Stand: Min guard, Supervision           General transfer  comment: Steady assist with cues for LE management and use of UEs to self assist    Ambulation/Gait Ambulation/Gait assistance: Min guard Gait Distance (Feet): 100 Feet Assistive device: Rolling walker (2 wheels) Gait Pattern/deviations: Step-to pattern, Decreased step length - right, Decreased step length - left, Shuffle, Trunk flexed Gait velocity: decr     General Gait Details: min cues for sequence, posture, position from RW and stride length   Stairs             Wheelchair Mobility    Modified Rankin (Stroke Patients Only)       Balance Overall balance assessment: Mild deficits observed, not formally tested                                          Cognition Arousal/Alertness: Awake/alert Behavior During Therapy: WFL for tasks assessed/performed Overall Cognitive Status: Within Functional Limits for tasks assessed                                          Exercises Total Joint Exercises Ankle Circles/Pumps: AROM, Both, 15 reps, Supine Quad Sets: AROM, Both, 10 reps, Supine Heel Slides: AAROM, Left, 15 reps, Supine Straight Leg Raises: AAROM, Left, 15 reps, Supine Long Arc Quad: AAROM, Left, 10 reps, Seated    General Comments        Pertinent Vitals/Pain Pain Assessment Pain Assessment: 0-10 Pain Score: 7  Pain Location: R knee Pain Descriptors / Indicators:  Aching, Sore Pain Intervention(s): Limited activity within patient's tolerance, Monitored during session, Premedicated before session, Ice applied    Home Living                          Prior Function            PT Goals (current goals can now be found in the care plan section) Acute Rehab PT Goals Patient Stated Goal: REgain IND PT Goal Formulation: With patient Time For Goal Achievement: 03/02/22 Potential to Achieve Goals: Good Progress towards PT goals: Progressing toward goals    Frequency    7X/week      PT Plan Current plan  remains appropriate    Co-evaluation              AM-PAC PT "6 Clicks" Mobility   Outcome Measure  Help needed turning from your back to your side while in a flat bed without using bedrails?: A Little Help needed moving from lying on your back to sitting on the side of a flat bed without using bedrails?: A Little Help needed moving to and from a bed to a chair (including a wheelchair)?: A Little Help needed standing up from a chair using your arms (e.g., wheelchair or bedside chair)?: A Little Help needed to walk in hospital room?: A Little Help needed climbing 3-5 steps with a railing? : A Little 6 Click Score: 18    End of Session Equipment Utilized During Treatment: Gait belt Activity Tolerance: Patient tolerated treatment well Patient left: in chair;with call bell/phone within reach;with chair alarm set;with nursing/sitter in room Nurse Communication: Mobility status PT Visit Diagnosis: Difficulty in walking, not elsewhere classified (R26.2)     Time: 2035-5974 PT Time Calculation (min) (ACUTE ONLY): 35 min  Charges:  $Gait Training: 8-22 mins $Therapeutic Exercise: 8-22 mins                     Debe Coder PT Acute Rehabilitation Services Pager [phone] Office [phone]    Krystal Campbell 02/24/2022, 11:52 AM

## 2022-02-24 NOTE — Progress Notes (Signed)
Patient ambulating with PT and heard and felt a pop and felt pain assisted to chair per PT and back to room. Barron Schmid PA notified D FranklinRN

## 2022-02-24 NOTE — Progress Notes (Signed)
Patient ID: Krystal Campbell, female   DOB: 1960-07-04, 61 y.o.   MRN: [id number] Subjective: 1 Day Post-Op Procedure(s) (LRB): TOTAL KNEE ARTHROPLASTY (Left)    Patient reports pain as mild to moderate. No events   Objective:   VITALS:   Vitals:   02/23/22 2327 02/24/22 0425  BP: (!) 150/80 135/75  Pulse: 74 78  Resp: 18 18  Temp: 98.7 F (37.1 C) 98.4 F (36.9 C)  SpO2: 90% 94%    Neurovascular intact Incision: dressing C/D/I  LABS Recent Labs    02/24/22 0346  HGB 10.4*  HCT 33.0*  WBC 8.6  PLT 226    Recent Labs    02/24/22 0346  NA 140  K 4.3  BUN 12  CREATININE 1.05*  GLUCOSE 135*    No results for input(s): "LABPT", "INR" in the last 72 hours.   Assessment/Plan: 1 Day Post-Op Procedure(s) (LRB): TOTAL KNEE ARTHROPLASTY (Left)   Advance diet Up with therapy Home this am after therapy RTC in 2 weeks

## 2022-02-24 NOTE — TOC Transition Note (Signed)
Transition of Care [facility]) - CM/SW Discharge Note  Patient Details  Name: [name] MRN: [id number] Date of Birth: 1960/11/10  [facility] CM/SW Contact:  Sherie Don, LCSW Phone Number: 02/24/2022, 9:51 AM  Clinical Narrative: CSW met with patient to review discharge plan. Patient will discharge home with OPPT at [facility] PT in [place]. Patient has a rolling walker at home, so there are no DME needs at this time. TOC signing off.   Final next level of care: OP Rehab Barriers to Discharge: No Barriers Identified   Patient Goals and CMS Choice Patient states their goals for this hospitalization and ongoing recovery are:: Discharge home with OPPT at [facility] PT in [place] Choice offered to / list presented to : NA  Discharge Plan and Services      DME Arranged: N/A DME Agency: NA  Social Determinants of Health (SDOH) Interventions  Readmission Risk Interventions     No data to display

## 2022-02-24 NOTE — Progress Notes (Signed)
Physical Therapy Treatment Patient Details Name: Krystal Campbell MRN: [id number] DOB: 1960/07/23 Today's Date: 02/24/2022   History of Present Illness Pt s/p L TKR and with hx of R TKR, CKD, DM, and colon CA    PT Comments    Pt continues very motivated and up to bathroom for toileting and hand hygiene at sink before ambulating increased distance in hall.  Pt demonstrating good stability and safety awareness but reports continued burning at site of "pop" this am.  RN has not heard back from DR at this point but pt continues hopeful for dc home this date.   Recommendations for follow up therapy are one component of a multi-disciplinary discharge planning process, led by the attending physician.  Recommendations may be updated based on patient status, additional functional criteria and insurance authorization.  Follow Up Recommendations  Follow physician's recommendations for discharge plan and follow up therapies     Assistance Recommended at Discharge Intermittent Supervision/Assistance  Patient can return home with the following A little help with walking and/or transfers;A little help with bathing/dressing/bathroom;Assistance with cooking/housework;Assist for transportation;Help with stairs or ramp for entrance   Equipment Recommendations  None recommended by PT    Recommendations for Other Services       Precautions / Restrictions Precautions Precautions: Knee;Fall Restrictions Weight Bearing Restrictions: No Other Position/Activity Restrictions: WBAT     Mobility  Bed Mobility Overal bed mobility: Needs Assistance Bed Mobility: Supine to Sit     Supine to sit: Supervision     General bed mobility comments: Pt up in chair and returned to same    Transfers Overall transfer level: Needs assistance Equipment used: Rolling walker (2 wheels) Transfers: Sit to/from Stand Sit to Stand: Min guard, Supervision           General transfer comment: min cues for LE  management and use of UEs to self assist    Ambulation/Gait Ambulation/Gait assistance: Min guard, Supervision Gait Distance (Feet): 135 Feet (and 15' into bathroom) Assistive device: Rolling walker (2 wheels) Gait Pattern/deviations: Step-to pattern, Decreased step length - right, Decreased step length - left, Shuffle, Trunk flexed Gait velocity: decr     General Gait Details: min cues for sequence, posture, position from RW and stride length   Stairs             Wheelchair Mobility    Modified Rankin (Stroke Patients Only)       Balance Overall balance assessment: Mild deficits observed, not formally tested                                          Cognition Arousal/Alertness: Awake/alert Behavior During Therapy: WFL for tasks assessed/performed Overall Cognitive Status: Within Functional Limits for tasks assessed                                          Exercises Total Joint Exercises Ankle Circles/Pumps: AROM, Both, 15 reps, Supine Quad Sets: AROM, Both, 10 reps, Supine Heel Slides: AAROM, Left, 15 reps, Supine Straight Leg Raises: AAROM, Left, 15 reps, Supine Long Arc Quad: AAROM, Left, 10 reps, Seated    General Comments        Pertinent Vitals/Pain Pain Assessment Pain Assessment: 0-10 Pain Score: 6  Pain Location: R knee Pain Descriptors / Indicators:  Aching, Burning, Sore Pain Intervention(s): Premedicated before session, Monitored during session, Limited activity within patient's tolerance, Ice applied    Home Living                          Prior Function            PT Goals (current goals can now be found in the care plan section) Acute Rehab PT Goals Patient Stated Goal: REgain IND PT Goal Formulation: With patient Time For Goal Achievement: 03/02/22 Potential to Achieve Goals: Good Progress towards PT goals: Progressing toward goals    Frequency    7X/week      PT Plan Current  plan remains appropriate    Co-evaluation              AM-PAC PT "6 Clicks" Mobility   Outcome Measure  Help needed turning from your back to your side while in a flat bed without using bedrails?: A Little Help needed moving from lying on your back to sitting on the side of a flat bed without using bedrails?: A Little Help needed moving to and from a bed to a chair (including a wheelchair)?: A Little Help needed standing up from a chair using your arms (e.g., wheelchair or bedside chair)?: A Little Help needed to walk in hospital room?: A Little Help needed climbing 3-5 steps with a railing? : A Little 6 Click Score: 18    End of Session Equipment Utilized During Treatment: Gait belt Activity Tolerance: Patient tolerated treatment well Patient left: in chair;with call bell/phone within reach;with chair alarm set;with nursing/sitter in room Nurse Communication: Mobility status PT Visit Diagnosis: Difficulty in walking, not elsewhere classified (R26.2)     Time: 5573-2202 PT Time Calculation (min) (ACUTE ONLY): 17 min  Charges:  $Gait Training: 8-22 mins $Therapeutic Exercise: 8-22 mins                     Debe Coder PT Acute Rehabilitation Services Pager [phone] Office [phone]    Sahiba Granholm 02/24/2022, 11:59 AM

## 2022-02-28 NOTE — Discharge Summary (Signed)
Patient ID: Krystal Campbell MRN: [id number] DOB/AGE: 05/09/1960 61 y.o.  Admit date: 02/23/2022 Discharge date: 02/24/2022  Admission Diagnoses:  Left knee osteoarthritis  Discharge Diagnoses:  Principal Problem:   S/P total knee arthroplasty, left   Past Medical History:  Diagnosis Date   Anemia    Anxiety    Arthritis    in knees   ASCUS of cervix with negative high risk HPV 01/28/2021   01/2021 repeat pap in 1 year per ASCCP guidelines    [facility]cer [facility])    colon - 2016   Carpal tunnel syndrome of right wrist    Chronic kidney disease    has 1/3 of right kidney - urethra was crushed (back in 1970)   Chronic pain syndrome    Colon cancer ([address])    Depression    Diabetes mellitus without complication ([place])    dx back in 2000   Diverticulosis    GERD (gastroesophageal reflux disease)    History of blood transfusion    10/09/14   History of hiatal hernia    Hyperlipidemia    Incisional hernia    Pneumonia    Pulmonary embolism ([place])    same time they found cancer in 2016  2 blood clots, one in each lung   Vaginal Pap smear, abnormal    Wears glasses     Surgeries: Procedure(s): TOTAL KNEE ARTHROPLASTY on 02/23/2022   Consultants:   Discharged Condition: Improved  Hospital Course: Krystal Campbell is an 61 y.o. female who was admitted 02/23/2022 for operative treatment ofS/P total knee arthroplasty, left. Patient has severe unremitting pain that affects sleep, daily activities, and work/hobbies. After pre-op clearance the patient was taken to the operating room on 02/23/2022 and underwent  Procedure(s): TOTAL KNEE ARTHROPLASTY.    Patient was given perioperative antibiotics:  Anti-infectives (From admission, onward)    Start     Dose/Rate Route Frequency Ordered Stop   02/23/22 1445  ceFAZolin (ANCEF) IVPB 2g/100 mL premix        2 g 200 mL/hr over 30 Minutes Intravenous Every 6 hours 02/23/22 1348 02/23/22 2057   02/23/22 0600  ceFAZolin (ANCEF) IVPB 2g/100 mL premix         2 g 200 mL/hr over 30 Minutes Intravenous On call to O.R. 02/23/22 0535 02/23/22 2130        Patient was given sequential compression devices, early ambulation, and chemoprophylaxis to prevent DVT. Patient worked with PT and was meeting their goals regarding safe ambulation and transfers.  Patient benefited maximally from hospital stay and there were no complications.    Recent vital signs: No data found.   Recent laboratory studies: No results for input(s): "WBC", "HGB", "HCT", "PLT", "NA", "K", "CL", "CO2", "BUN", "CREATININE", "GLUCOSE", "INR", "CALCIUM" in the last 72 hours.  Invalid input(s): "PT", "2"   Discharge Medications:   Allergies as of 02/24/2022       Reactions   Fentanyl Anxiety, Other (See Comments)   Severe agitation   Hydromorphone Other (See Comments)   Severe agitation after several days of use        Medication List     STOP taking these medications    aspirin EC 81 MG tablet   pantoprazole 40 MG tablet Commonly known as: PROTONIX   traMADol 50 MG tablet Commonly known as: ULTRAM       TAKE these medications    acetaminophen 500 MG tablet Commonly known as: TYLENOL Take 2 tablets (1,000 mg total) by mouth  every 6 (six) hours.   amitriptyline 25 MG tablet Commonly known as: ELAVIL Take 25 mg by mouth at bedtime.   citalopram 20 MG tablet Commonly known as: CELEXA Take 20 mg by mouth 3 (three) times daily.   cyanocobalamin 1000 MCG tablet Take 1 tablet (1,000 mcg total) by mouth daily.   docusate sodium 100 MG capsule Commonly known as: COLACE Take 100 mg by mouth daily.   famotidine 20 MG tablet Commonly known as: PEPCID Take 20 mg by mouth 2 (two) times daily as needed for heartburn or indigestion.   gabapentin 300 MG capsule Commonly known as: NEURONTIN Take 900 mg by mouth 3 (three) times daily.   loratadine 10 MG tablet Commonly known as: CLARITIN Take 1 tablet (10 mg total) by mouth daily. What changed:   when to take this reasons to take this   MAGNESIUM PO Take 1 tablet by mouth daily.   Melatonin 10 MG Caps Take 20 mg by mouth at bedtime.   metFORMIN 1000 MG (MOD) 24 hr tablet Commonly known as: GLUMETZA Take 1,000 mg by mouth 2 (two) times daily with a meal.   methocarbamol 500 MG tablet Commonly known as: ROBAXIN Take 1 tablet (500 mg total) by mouth every 6 (six) hours as needed for muscle spasms.   naloxone 4 MG/0.1ML Liqd nasal spray kit Commonly known as: NARCAN Spray into nostril with signs of opioid overdose or oversedation   ondansetron 4 MG tablet Commonly known as: ZOFRAN Take 4 mg by mouth every 8 (eight) hours as needed for nausea or vomiting.   oxyCODONE 5 MG immediate release tablet Commonly known as: Oxy IR/ROXICODONE Take 1 tablet (5 mg total) by mouth every 4 (four) hours as needed for severe pain.   Ozempic (0.25 or 0.5 MG/DOSE) 2 MG/3ML Sopn Generic drug: Semaglutide(0.25 or 0.5MG/DOS) Inject 0.5 mg into the skin every Monday.   polyethylene glycol 17 g packet Commonly known as: MIRALAX / GLYCOLAX Take 17 g by mouth 2 (two) times daily.   rivaroxaban 10 MG Tabs tablet Commonly known as: XARELTO Take 1 tablet (10 mg total) by mouth daily with breakfast for 14 days. Then take aspirin 81 mg twice a day for 4 more weeks   TRUEplus Lancets 33G Misc   VITAMIN D PO Take 1 tablet by mouth daily.               Discharge Care Instructions  (From admission, onward)           Start     Ordered   02/24/22 0000  Change dressing       Comments: Maintain surgical dressing until follow up in the clinic. If the edges start to pull up, may reinforce with tape. If the dressing is no longer working, may remove and cover with gauze and tape, but must keep the area dry and clean.  Call with any questions or concerns.   02/24/22 0803            Diagnostic Studies: No results found.  Disposition: Discharge disposition: 01-Home or Self  Care       Discharge Instructions     Call MD / Call 911   Complete by: As directed    If you experience chest pain or shortness of breath, CALL 911 and be transported to the hospital emergency room.  If you develope a fever above 101 F, pus (white drainage) or increased drainage or redness at the wound, or calf pain, call your surgeon's  office.   Change dressing   Complete by: As directed    Maintain surgical dressing until follow up in the clinic. If the edges start to pull up, may reinforce with tape. If the dressing is no longer working, may remove and cover with gauze and tape, but must keep the area dry and clean.  Call with any questions or concerns.   Constipation Prevention   Complete by: As directed    Drink plenty of fluids.  Prune juice may be helpful.  You may use a stool softener, such as Colace (over the counter) 100 mg twice a day.  Use MiraLax (over the counter) for constipation as needed.   Diet - low sodium heart healthy   Complete by: As directed    Increase activity slowly as tolerated   Complete by: As directed    Weight bearing as tolerated with assist device (walker, cane, etc) as directed, use it as long as suggested by your surgeon or therapist, typically at least 4-6 weeks.   Post-operative opioid taper instructions:   Complete by: As directed    POST-OPERATIVE OPIOID TAPER INSTRUCTIONS: It is important to wean off of your opioid medication as soon as possible. If you do not need pain medication after your surgery it is ok to stop day one. Opioids include: Codeine, Hydrocodone(Norco, Vicodin), Oxycodone(Percocet, oxycontin) and hydromorphone amongst others.  Long term and even short term use of opiods can cause: Increased pain response Dependence Constipation Depression Respiratory depression And more.  Withdrawal symptoms can include Flu like symptoms Nausea, vomiting And more Techniques to manage these symptoms Hydrate well Eat regular healthy  meals Stay active Use relaxation techniques(deep breathing, meditating, yoga) Do Not substitute Alcohol to help with tapering If you have been on opioids for less than two weeks and do not have pain than it is ok to stop all together.  Plan to wean off of opioids This plan should start within one week post op of your joint replacement. Maintain the same interval or time between taking each dose and first decrease the dose.  Cut the total daily intake of opioids by one tablet each day Next start to increase the time between doses. The last dose that should be eliminated is the evening dose.      TED hose   Complete by: As directed    Use stockings (TED hose) for 2 weeks on both leg(s).  You may remove them at night for sleeping.        Follow-up Information     Paralee Cancel, MD. Schedule an appointment as soon as possible for a visit in 2 week(s).   Specialty: Orthopedic Surgery Contact information: [address] [place] [address] [place] [address] [phone]         [facility] Follow up.   Specialty: Physical Therapy Contact information: [facility] [place]  [address] [phone]                  Signed: Irving Copas 02/28/2022, 1:33 PM

## 2023-01-26 ENCOUNTER — Ambulatory Visit: Payer: Medicare PPO | Admitting: Adult Health

## 2023-03-12 ENCOUNTER — Encounter: Payer: Self-pay | Admitting: Adult Health

## 2023-03-12 ENCOUNTER — Ambulatory Visit: Payer: Medicare PPO | Admitting: Adult Health

## 2023-03-12 ENCOUNTER — Other Ambulatory Visit (HOSPITAL_COMMUNITY)
Admission: RE | Admit: 2023-03-12 | Discharge: 2023-03-12 | Disposition: A | Discharge status: Home / Self Care | Payer: Medicare PPO | Source: Ambulatory Visit | Attending: Adult Health | Admitting: Adult Health

## 2023-03-12 VITALS — BP 118/66 | HR 76 | Ht 64.0 in | Wt 237.0 lb

## 2023-03-12 DIAGNOSIS — Z01419 Encounter for gynecological examination (general) (routine) without abnormal findings: Secondary | ICD-10-CM | POA: Insufficient documentation

## 2023-03-12 DIAGNOSIS — R87610 Atypical squamous cells of undetermined significance on cytologic smear of cervix (ASC-US): Secondary | ICD-10-CM

## 2023-03-12 DIAGNOSIS — Z1211 Encounter for screening for malignant neoplasm of colon: Secondary | ICD-10-CM

## 2023-03-12 DIAGNOSIS — Z1151 Encounter for screening for human papillomavirus (HPV): Secondary | ICD-10-CM | POA: Insufficient documentation

## 2023-03-12 DIAGNOSIS — Z1331 Encounter for screening for depression: Secondary | ICD-10-CM

## 2023-03-12 DIAGNOSIS — D229 Melanocytic nevi, unspecified: Secondary | ICD-10-CM | POA: Diagnosis not present

## 2023-03-12 DIAGNOSIS — Z780 Asymptomatic menopausal state: Secondary | ICD-10-CM | POA: Diagnosis not present

## 2023-03-12 DIAGNOSIS — Z124 Encounter for screening for malignant neoplasm of cervix: Secondary | ICD-10-CM | POA: Insufficient documentation

## 2023-03-12 LAB — HEMOCCULT GUIAC POC 1CARD (OFFICE): Fecal Occult Blood, POC: NEGATIVE

## 2023-03-12 NOTE — Progress Notes (Signed)
Patient ID: ANGELYNE CONKLING, female   DOB: 12-07-1960, 62 y.o.   MRN: [id number] History of Present Illness: Alaysa is a 62 year old white female, widowed, PM in for a well woman gyn exam and pap. Her pap last year was negative HPV but ASCUS. Has moles she wants checked too.  PCP is Dr Bryna Colander.   Current Medications, Allergies, Past Medical History, Past Surgical History, Family History and Social History were reviewed in [employer] record.     Review of Systems: Patient denies any headaches, hearing loss, fatigue, blurred vision, shortness of breath, chest pain, abdominal pain, problems with bowel movements(had diverticulitis  flare last night, urination, or intercourse(not active). No joint pain or mood swings.  Denies any vaginal bleeding  Has moles she wants checked    Physical Exam:BP 118/66 (BP Location: Right Arm, Patient Position: Sitting, Cuff Size: Large)   Pulse 76   Ht 5\' 4"  (1.626 m)   Wt 237 lb (107.5 kg)   BMI 40.68 kg/m   General:  Well developed, well nourished, no acute distress Skin:  Warm and dry Neck:  Midline trachea, normal thyroid, good ROM, no lymphadenopathy, no carotid bruits heard  Lungs; Clear to auscultation bilaterally Breast:  No dominant palpable mass, retraction, or nipple discharge, still has port a cath above left breast.  Cardiovascular: Regular rate and rhythm Abdomen:  Soft, non tender, no hepatosplenomegaly Pelvic:  External genitalia is normal in appearance, has 3-4 irregular flat gray moles on mons pubis, ?warty characteristics on 2. The vagina is pale with loss of moisture and rugae. Urethra has no lesions or masses. The cervix is smooth, pap with HR HPV genotyping performed.  Uterus is felt to be normal size, shape, and contour.  No adnexal masses or tenderness noted.Bladder is non tender, no masses felt. Rectal: Good sphincter tone, no polyps, or hemorrhoids felt.  Hemoccult negative. Extremities/musculoskeletal:  No  swelling or varicosities noted, no clubbing or cyanosis Psych:  No mood changes, alert and cooperative,seems happy AA is 1 Fall risk is low    03/12/2023   10:22 AM 01/23/2022   10:33 AM 01/21/2021   10:48 AM  Depression screen PHQ 2/9  Decreased Interest 1 1 2   Down, Depressed, Hopeless 1 2 3   PHQ - 2 Score 2 3 5   Altered sleeping 1 1 2   Tired, decreased energy 1 2 2   Change in appetite 1 2 2   Feeling bad or failure about yourself  1 0 3  Trouble concentrating 1 0 3  Moving slowly or fidgety/restless 0 0 0  Suicidal thoughts 0 0 0  PHQ-9 Score 7 8 17    On meds    03/12/2023   10:22 AM 01/23/2022   10:33 AM 01/21/2021   10:50 AM 01/20/2020    9:34 AM  GAD 7 : Generalized Anxiety Score  Nervous, Anxious, on Edge 0 1 0 1  Control/stop worrying 0 0 0 0  Worry too much - different things 1 0 0 1  Trouble relaxing 1 1 2 1   Restless 0 0 0 0  Easily annoyed or irritable 1 0 0 0  Afraid - awful might happen 0 0 0 0  Total GAD 7 Score 3 2 2 3     Upstream - 03/12/23 1022       Pregnancy Intention Screening   Does the patient want to become pregnant in the next year? N/A    Does the patient's partner want to become pregnant in the  next year? N/A    Would the patient like to discuss contraceptive options today? N/A      Contraception Wrap Up   Current Method Female Sterilization   PM   End Method Female Sterilization   PM   Contraception Counseling Provided No              Examination chaperoned by Faith Rogue LPN   Impression and plan: 1. Encounter for gynecological examination with Papanicolaou smear of cervix (Primary) Pap sent Pap in 3 years if normal Physical in 1 year Labs with PCP Mammogram was negative 01/12/22 at [facility]  Colonoscopy per GI  - Cytology - PAP( [facility])  2. ASCUS of cervix with negative high risk HPV Pap sent   3. Postmenopause Denies any vaginal bleeding   4. Encounter for screening fecal occult blood  testing Hemoccult was negative  - POCT occult blood stool  5. Numerous skin moles Return in about 4 weeks to see Dr Despina Hidden about removal

## 2023-03-15 LAB — CYTOLOGY - PAP
Adequacy: ABSENT
Comment: NEGATIVE
Diagnosis: NEGATIVE
High risk HPV: NEGATIVE

## 2023-04-03 ENCOUNTER — Telehealth: Payer: Self-pay | Admitting: Adult Health

## 2023-04-03 NOTE — Telephone Encounter (Signed)
 Pt aware that I got copy of mammogram and it was normal.

## 2023-04-09 ENCOUNTER — Other Ambulatory Visit (HOSPITAL_COMMUNITY)
Admission: RE | Admit: 2023-04-09 | Discharge: 2023-04-09 | Disposition: A | Discharge status: Home / Self Care | Payer: Medicare PPO | Source: Ambulatory Visit | Attending: Obstetrics & Gynecology | Admitting: Obstetrics & Gynecology

## 2023-04-09 ENCOUNTER — Ambulatory Visit: Payer: Medicare PPO | Admitting: Obstetrics & Gynecology

## 2023-04-09 ENCOUNTER — Encounter: Payer: Self-pay | Admitting: Obstetrics & Gynecology

## 2023-04-09 VITALS — BP 122/73 | HR 88 | Ht 64.0 in

## 2023-04-09 DIAGNOSIS — L821 Other seborrheic keratosis: Secondary | ICD-10-CM | POA: Diagnosis present

## 2023-04-09 NOTE — Progress Notes (Signed)
 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 She is aware that seborrheic keratoses are benign lesions, and their removal is often not considered medically necessary. Informed consent was obtained. Preparation: The area was prepared in a standard fashion. Anesthesia: 1% lidocaine  plain 20 cc total used Procedure Details:  Area prepped with betadine  20 cc total 1% lidocaine  injected #11 blade used and 4 Sk removed in elliptical fashion with margin Sutured closed with 3-0 ethilon on FS1, total of 10 sutures required for hemostasis and closure of all 4 sites Total number of lesions treated: 4 Plan: The patient was instructed on post-op care. Recommend OTC analgesia as needed for pain. Use neosporin 2-3 times per day  Return in 10d for suture removal

## 2023-04-09 NOTE — Addendum Note (Signed)
 Addended by: Annamarie Dawley on: 04/09/2023 10:53 AM   Modules accepted: Orders

## 2023-04-10 LAB — SURGICAL PATHOLOGY

## 2023-04-19 ENCOUNTER — Ambulatory Visit: Payer: Medicare PPO | Admitting: Obstetrics & Gynecology

## 2023-04-19 ENCOUNTER — Encounter: Payer: Self-pay | Admitting: Obstetrics & Gynecology

## 2023-04-19 VITALS — BP 123/70 | HR 84 | Ht 64.0 in | Wt 239.0 lb

## 2023-04-19 DIAGNOSIS — Z4802 Encounter for removal of sutures: Secondary | ICD-10-CM

## 2023-04-19 NOTE — Progress Notes (Signed)
  HPI: Patient returns for routine postoperative follow-up having undergone removal of 4 SK in office  on 04/09/23.  The patient's immediate postoperative recovery has been unremarkable. Since hospital discharge the patient reports no problems.   Current Outpatient Medications: acetaminophen (TYLENOL) 500 MG tablet, Take 2 tablets (1,000 mg total) by mouth every 6 (six) hours., Disp: 30 tablet, Rfl: 0 amitriptyline (ELAVIL) 25 MG tablet, Take 25 mg by mouth at bedtime., Disp: , Rfl:  citalopram (CELEXA) 20 MG tablet, Take 20 mg by mouth 3 (three) times daily., Disp: , Rfl:  docusate sodium (COLACE) 100 MG capsule, Take 100 mg by mouth daily., Disp: , Rfl:  famotidine (PEPCID) 20 MG tablet, Take 20 mg by mouth 2 (two) times daily as needed for heartburn or indigestion., Disp: , Rfl:  gabapentin (NEURONTIN) 300 MG capsule, Take 900 mg by mouth 3 (three) times daily., Disp: , Rfl:  loratadine (CLARITIN) 10 MG tablet, Take 1 tablet (10 mg total) by mouth daily. (Patient taking differently: Take 10 mg by mouth daily as needed for allergies.), Disp: , Rfl:  MAGNESIUM PO, Take 1 tablet by mouth daily., Disp: , Rfl:  Melatonin 10 MG CAPS, Take 20 mg by mouth at bedtime. , Disp: , Rfl:  metFORMIN (GLUMETZA) 1000 MG (MOD) 24 hr tablet, Take 1,000 mg by mouth 2 (two) times daily with a meal., Disp: , Rfl:  methocarbamol (ROBAXIN) 500 MG tablet, Take 1 tablet (500 mg total) by mouth every 6 (six) hours as needed for muscle spasms. (Patient not taking: Reported on 04/19/2023), Disp: 40 tablet, Rfl: 2 naloxone (NARCAN) nasal spray 4 mg/0.1 mL, Spray into nostril with signs of opioid overdose or oversedation, Disp: 1 each, Rfl: 0 ondansetron (ZOFRAN) 4 MG tablet, Take 4 mg by mouth every 8 (eight) hours as needed for nausea or vomiting. (Patient not taking: Reported on 04/19/2023), Disp: , Rfl:  polyethylene glycol (MIRALAX / GLYCOLAX) 17 g packet, Take 17 g by mouth 2 (two) times daily., Disp: 14 each, Rfl:  0 TRUEplus Lancets 33G MISC, , Disp: , Rfl:  VITAMIN D PO, Take 1 tablet by mouth daily., Disp: , Rfl:   No current facility-administered medications for this visit.    Blood pressure 123/70, pulse 84, height 5\' 4"  (1.626 m), weight 239 lb (108.4 kg).  Physical Exam: 3 incisions for removal of 4 lesions all look good 10 sutures removed No signs of infection  Diagnostic Tests:   Pathology: Benign SK  Impression + Management plan: (Z48.02) Encounter for removal of sutures  (primary encounter diagnosis)      Medications Prescribed this encounter: No orders of the defined types were placed in this encounter.     Follow up: No follow-ups on file.    Lazaro Arms, MD Attending Physician for the Center for [facility] and [facility] 04/19/2023 10:15 AM

## 2024-05-27 ENCOUNTER — Ambulatory Visit: Admitting: Adult Health
